# Patient Record
Sex: Female | Born: 1945 | Race: White | Hispanic: No | Marital: Married | Smoking: Never smoker
Health system: Southern US, Community
[De-identification: ages and names within clinical notes are randomized; demographics above are authoritative.]

## PROBLEM LIST (undated history)

## (undated) DIAGNOSIS — Z800 Family history of malignant neoplasm of digestive organs: Secondary | ICD-10-CM

## (undated) DIAGNOSIS — Z860100 Personal history of colon polyps, unspecified: Secondary | ICD-10-CM

## (undated) DIAGNOSIS — E785 Hyperlipidemia, unspecified: Secondary | ICD-10-CM

## (undated) DIAGNOSIS — R0600 Dyspnea, unspecified: Secondary | ICD-10-CM

## (undated) DIAGNOSIS — C182 Malignant neoplasm of ascending colon: Secondary | ICD-10-CM

## (undated) DIAGNOSIS — K219 Gastro-esophageal reflux disease without esophagitis: Secondary | ICD-10-CM

## (undated) DIAGNOSIS — I214 Non-ST elevation (NSTEMI) myocardial infarction: Secondary | ICD-10-CM

## (undated) DIAGNOSIS — I639 Cerebral infarction, unspecified: Secondary | ICD-10-CM

## (undated) DIAGNOSIS — I219 Acute myocardial infarction, unspecified: Secondary | ICD-10-CM

## (undated) DIAGNOSIS — R112 Nausea with vomiting, unspecified: Secondary | ICD-10-CM

## (undated) DIAGNOSIS — Z8679 Personal history of other diseases of the circulatory system: Secondary | ICD-10-CM

## (undated) DIAGNOSIS — Z9889 Other specified postprocedural states: Secondary | ICD-10-CM

## (undated) DIAGNOSIS — D509 Iron deficiency anemia, unspecified: Secondary | ICD-10-CM

## (undated) DIAGNOSIS — K5900 Constipation, unspecified: Secondary | ICD-10-CM

## (undated) DIAGNOSIS — I2510 Atherosclerotic heart disease of native coronary artery without angina pectoris: Secondary | ICD-10-CM

## (undated) DIAGNOSIS — M25511 Pain in right shoulder: Secondary | ICD-10-CM

## (undated) DIAGNOSIS — I10 Essential (primary) hypertension: Secondary | ICD-10-CM

## (undated) HISTORY — PX: OTHER SURGICAL HISTORY: SHX169

## (undated) HISTORY — PX: CHOLECYSTECTOMY: SHX55

## (undated) HISTORY — PX: CORONARY ANGIOPLASTY: SHX604

## (undated) HISTORY — DX: Personal history of colonic polyps: Z86.010

## (undated) HISTORY — PX: BACK SURGERY: SHX140

## (undated) HISTORY — PX: NASAL FRACTURE SURGERY: SHX718

## (undated) HISTORY — DX: Personal history of colon polyps, unspecified: Z86.0100

## (undated) HISTORY — DX: Family history of malignant neoplasm of digestive organs: Z80.0

## (undated) HISTORY — PX: ABDOMINAL HYSTERECTOMY: SHX81

## (undated) HISTORY — PX: CYST EXCISION: SHX5701

---
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
## 1994-06-08 DIAGNOSIS — I639 Cerebral infarction, unspecified: Secondary | ICD-10-CM

## 1994-06-08 HISTORY — DX: Cerebral infarction, unspecified: I63.9

## 1998-08-13 ENCOUNTER — Other Ambulatory Visit: Admission: RE | Admit: 1998-08-13 | Discharge: 1998-08-13 | Payer: Self-pay | Admitting: Obstetrics and Gynecology

## 1999-07-24 ENCOUNTER — Encounter: Admission: RE | Admit: 1999-07-24 | Discharge: 1999-07-24 | Payer: Self-pay | Admitting: Obstetrics and Gynecology

## 1999-07-24 ENCOUNTER — Encounter: Payer: Self-pay | Admitting: Obstetrics and Gynecology

## 1999-08-01 ENCOUNTER — Encounter: Payer: Self-pay | Admitting: Obstetrics and Gynecology

## 1999-08-01 ENCOUNTER — Encounter: Admission: RE | Admit: 1999-08-01 | Discharge: 1999-08-01 | Payer: Self-pay | Admitting: Obstetrics and Gynecology

## 1999-08-26 ENCOUNTER — Ambulatory Visit (HOSPITAL_COMMUNITY): Admission: RE | Admit: 1999-08-26 | Discharge: 1999-08-26 | Payer: Self-pay | Admitting: *Deleted

## 1999-08-26 ENCOUNTER — Encounter: Payer: Self-pay | Admitting: *Deleted

## 2000-03-30 ENCOUNTER — Encounter: Payer: Self-pay | Admitting: Obstetrics and Gynecology

## 2000-03-30 ENCOUNTER — Encounter: Admission: RE | Admit: 2000-03-30 | Discharge: 2000-03-30 | Payer: Self-pay | Admitting: Obstetrics and Gynecology

## 2000-10-25 ENCOUNTER — Encounter: Admission: RE | Admit: 2000-10-25 | Discharge: 2000-10-25 | Payer: Self-pay | Admitting: Obstetrics and Gynecology

## 2000-10-25 ENCOUNTER — Encounter: Payer: Self-pay | Admitting: Obstetrics and Gynecology

## 2000-12-20 ENCOUNTER — Other Ambulatory Visit: Admission: RE | Admit: 2000-12-20 | Discharge: 2000-12-20 | Payer: Self-pay | Admitting: Obstetrics and Gynecology

## 2001-10-27 ENCOUNTER — Encounter: Admission: RE | Admit: 2001-10-27 | Discharge: 2001-10-27 | Payer: Self-pay | Admitting: Obstetrics and Gynecology

## 2001-10-27 ENCOUNTER — Encounter: Payer: Self-pay | Admitting: Obstetrics and Gynecology

## 2002-06-23 ENCOUNTER — Encounter: Admission: RE | Admit: 2002-06-23 | Discharge: 2002-06-23 | Payer: Self-pay | Admitting: Family Medicine

## 2002-06-23 ENCOUNTER — Encounter: Payer: Self-pay | Admitting: Family Medicine

## 2003-02-25 ENCOUNTER — Encounter: Payer: Self-pay | Admitting: Emergency Medicine

## 2003-02-25 ENCOUNTER — Emergency Department (HOSPITAL_COMMUNITY): Admission: EM | Admit: 2003-02-25 | Discharge: 2003-02-25 | Payer: Self-pay | Admitting: Emergency Medicine

## 2003-03-01 ENCOUNTER — Observation Stay (HOSPITAL_COMMUNITY): Admission: RE | Admit: 2003-03-01 | Discharge: 2003-03-02 | Payer: Self-pay | Admitting: General Surgery

## 2003-03-01 ENCOUNTER — Encounter: Payer: Self-pay | Admitting: General Surgery

## 2003-03-01 ENCOUNTER — Encounter (INDEPENDENT_AMBULATORY_CARE_PROVIDER_SITE_OTHER): Payer: Self-pay | Admitting: Specialist

## 2003-03-29 ENCOUNTER — Encounter: Admission: RE | Admit: 2003-03-29 | Discharge: 2003-03-29 | Payer: Self-pay | Admitting: Obstetrics and Gynecology

## 2003-03-29 ENCOUNTER — Encounter: Payer: Self-pay | Admitting: Obstetrics and Gynecology

## 2004-03-31 ENCOUNTER — Encounter: Admission: RE | Admit: 2004-03-31 | Discharge: 2004-03-31 | Payer: Self-pay | Admitting: Obstetrics and Gynecology

## 2005-04-06 ENCOUNTER — Encounter: Admission: RE | Admit: 2005-04-06 | Discharge: 2005-04-06 | Payer: Self-pay | Admitting: Obstetrics and Gynecology

## 2005-09-03 ENCOUNTER — Emergency Department (HOSPITAL_COMMUNITY): Admission: EM | Admit: 2005-09-03 | Discharge: 2005-09-03 | Payer: Self-pay | Admitting: Emergency Medicine

## 2005-09-16 ENCOUNTER — Ambulatory Visit (HOSPITAL_BASED_OUTPATIENT_CLINIC_OR_DEPARTMENT_OTHER): Admission: RE | Admit: 2005-09-16 | Discharge: 2005-09-16 | Payer: Self-pay | Admitting: Otolaryngology

## 2006-04-08 ENCOUNTER — Encounter: Admission: RE | Admit: 2006-04-08 | Discharge: 2006-04-08 | Payer: Self-pay | Admitting: Obstetrics and Gynecology

## 2007-05-13 ENCOUNTER — Encounter: Admission: RE | Admit: 2007-05-13 | Discharge: 2007-05-13 | Payer: Self-pay | Admitting: Obstetrics and Gynecology

## 2008-05-14 ENCOUNTER — Encounter: Admission: RE | Admit: 2008-05-14 | Discharge: 2008-05-14 | Payer: Self-pay | Admitting: Obstetrics and Gynecology

## 2008-07-11 ENCOUNTER — Encounter: Admission: RE | Admit: 2008-07-11 | Discharge: 2008-07-11 | Payer: Self-pay | Admitting: Family Medicine

## 2009-05-17 ENCOUNTER — Encounter: Admission: RE | Admit: 2009-05-17 | Discharge: 2009-05-17 | Payer: Self-pay | Admitting: Obstetrics and Gynecology

## 2010-05-23 ENCOUNTER — Encounter
Admission: RE | Admit: 2010-05-23 | Discharge: 2010-05-23 | Payer: Self-pay | Source: Home / Self Care | Attending: Obstetrics and Gynecology | Admitting: Obstetrics and Gynecology

## 2010-10-24 NOTE — Op Note (Signed)
[name], Thomas                      ACCOUNT NO.:  [id number]   MEDICAL RECORD NO.:  [id number]                   PATIENT TYPE:  OBV   LOCATION:  0470                                 FACILITY:  [facility]   PHYSICIAN:  Anselm Pancoast. Zachery Dakins, M.D.          DATE OF BIRTH:  1945-09-04   DATE OF PROCEDURE:  03/01/2003  DATE OF DISCHARGE:                                 OPERATIVE REPORT   PREOPERATIVE DIAGNOSIS:  Chronic cholecystitis.   POSTOPERATIVE DIAGNOSIS:  Chronic cholecystitis.   OPERATION:  Laparoscopic cholecystectomy with cholangiogram.   SURGEON:  Anselm Pancoast. Zachery Dakins, M.D.   ASSISTANT:  Ollen Gross. Carolynne Edouard, M.D.   HISTORY:  [name] is a 65 year old female who was seen in the  emergency room early Sunday morning by Dr. Daphine Deutscher after she had presented  during the night and had first been given pain medication. EKG was  unremarkable. An ultrasound of the gallbladder was obtained which showed  stones. When Dr. Daphine Deutscher saw her, she did not appear to be acutely tender and  he recommended that she be scheduled electively. She said she continued to  be nauseous and called yesterday and I saw her in the urgent office. She was  not acutely tender but she was definitely still have symptoms of kind of  nausea, tenderness in the right upper quadrant with deep palpation and she  was complaining of some pain in the back. I recommended we sort of add her  to the OR scheduled for the day and she was in agreement with this.  Preoperatively she was given 3 g of Unasyn and she has PAS stockings and was  taken to the operative suite.   DESCRIPTION OF PROCEDURE:  Induction of general anesthesia, endotracheal  tube, oral tube into the stomach and the abdomen was scrubbed with Betadine  surgical scrub and solution and draped in a sterile manner. She has had a  lower GYN procedure and I made a little incision just below the umbilicus.  The fascia was identified, this was picked up between  Kocher's, kind of  carefully opened. The posterior fascia was identified, this was picked up  carefully. I entered into the peritoneal cavity. A pursestring suture of #0  Vicryl was placed and then the Hasson cannula introduced. Upon looking at  the gallbladder it was very tense, not hemorrhagic and the upper 10 mm  trocars placed in the subxiphoid area after anesthetizing the fascia. Dr.  Carolynne Edouard placed the two lateral 5 mm trocars below the lower edge of the liver.  The gallbladder was grasped and retracted upward. The omentum that was  slightly adherent to this was carefully dissected down and then the proximal  portion of the gallbladder identified. You could see a stone impacted in the  neck of the cystic duct gallbladder junction and we placed a hemoclip on the  cystic duct just below this. Next, there was a little peritoneal area where  the  little cystic lymph node was. I placed one clip below it and then freed  this and then identified the cystic artery that was hemoclipped also. Next,  a Cook catheter was introduced into the cystic duct, cholangiogram showed  good flow into the duodenum and I then removed the cystic duct catheter,  triply clipped the cystic duct divided it and then placed two additional  clips on the cystic artery and then divided it under direct vision. The  gallbladder was kind of on the mesentery and this was carefully divided with  the hook electrocautery and after the gallbladder was completely freed, good  hemostasis. I did place the distended gallbladder in an EndoCatch bag. Next  the camera was switched to the upper 10 mm trocar and then the gallbladder  was grasped, brought up through the umbilical fascial defect. I opened the  bag, grasped the gallbladder and then decompressed it. The stones were  reasonably large and I crushed those and they would come through the fascial  defect without enlarging it. I then placed a figure-of-eight in addition to  the  positive of #0 Vicryl and these were tied. I then anesthetized the  fascia at the umbilicus. The irrigating fluid had been aspirated, the 5 mm  ports were withdrawn under direct vision, the carbon dioxide released and  then the upper 10 mm trocar withdrawn. The subcutaneous wounds were closed  with 4-0 Vicryl, Benzoin and Steri-Strips on the skin. The patient tolerated  the procedure nicely and hopefully will have minimal pain postoperatively  and be able to be discharged in the a.m.                                               Anselm Pancoast. Zachery Dakins, M.D.    WJW/MEDQ  D:  03/01/2003  T:  03/02/2003  Job:  [id number]

## 2010-10-24 NOTE — Op Note (Signed)
NAMEVERLEY, PARISEAU            ACCOUNT NO.:  [id number]   MEDICAL RECORD NO.:  [id number]           PATIENT TYPE:   LOCATION:                                 FACILITY:   PHYSICIAN:  Suzanna Obey, M.D.            DATE OF BIRTH:   DATE OF PROCEDURE:  09/16/2005  DATE OF DISCHARGE:                                 OPERATIVE REPORT   PREOPERATIVE DIAGNOSIS:  Nasal fracture.   POSTOPERATIVE DIAGNOSIS:  Nasal fracture.   OPERATION PERFORMED:  Closed reduction of nasal fracture with stabilization.   SURGEON:  Suzanna Obey, M.D.   ANESTHESIA:  General.   ESTIMATED BLOOD LOSS:  Less than 1 mL.   INDICATIONS FOR PROCEDURE:  This is a 65 year old who has had a fall and hit  her nose and now has a deviation of her nose to the right.  It is bothering  her and she does need to have it repaired.  She was informed of the risks  and benefits of the procedure including bleeding, infection, scarring,  possibility that fracture will not reduce.  Possible need for future  rhinoplasty and risks of the anesthetic.  All questions were answered and  consent was obtained.   DESCRIPTION OF PROCEDURE:  The patient was taken to the operating room and  placed in supine position after adequate LMA anesthesia, Afrin pledgets were  placed in the nose bilaterally.  The nose was then reduced.  The bone did  move significantly on the right side back into position.  The nose then  looked straight.  The elevator was placed in the left side and the bones  were elevated on the outside slightly.  It did feel like it moved very  slightly.  Again the dorsum looked straight.  The septum looked good.  Steri-  Strips and benzoin were placed over the dorsum of the nose and Aquaplast  cast positioned.  The patient was awakened and brought to recovery in stable  condition.  Counts correct.           ______________________________  Suzanna Obey, M.D.     JB/MEDQ  D:  09/16/2005  T:  09/16/2005  Job:  [id number]

## 2010-10-24 NOTE — Consult Note (Signed)
[name], [name]                      ACCOUNT NO.:  [id number]   MEDICAL RECORD NO.:  [id number]                   PATIENT TYPE:  EMS   LOCATION:  MINO                                 FACILITY:  [facility]   PHYSICIAN:  [place]. Daphine Deutscher, M.D.             DATE OF BIRTH:  01-28-1946   DATE OF CONSULTATION:  02/25/2003  DATE OF DISCHARGE:                                   CONSULTATION   REFERRING PHYSICIAN:  Dr. Celene Kras.   PHONE NUMBER:  [phone]   ADDRESS:  [address]  [place], [address]   CHIEF COMPLAINT:  Upper abdominal pain beginning around 3 a.m. on February 25, 2003.   HISTORY:  I was asked to see this 65 year old white female who was awakened  on this Sunday morning about 3 a.m. with epigastric pain which she described  in the xiphoid region radiating into her back.  She presented to the [facility]gency Room and was checked around 9 o'clock, where she was having this  pain.  She was evaluated and lab drawn and was sent for an ultrasound.  This  showed a gallstone that may be down toward the neck of her gallbladder.  Following then, when she was seen by me about 1400 hours, the pain had  abated and she was feeling much better.   ALLERGIES:  Past medical history is pertinent for an allergy to DARVON  COMPOUND.   CURRENT MEDICINES:  Current medicines include Prilosec and Zantac.   PAST MEDICAL HISTORY:  Past medical history includes surgery of a  hysterectomy through a lower midline incision.   PHYSICAL EXAMINATION:  GENERAL:  Physical exam shows her to be in no acute  distress.  VITAL SIGNS:  Blood pressure is 156/82, pulse 54, respirations 12 and  afebrile.  HEENT:  Head normocephalic.  Eyes:  Sclerae are nonicteric.  Pupils are  equal, round and reactive to light.  Nose and throat exam unremarkable.  NECK:  Neck is supple.  No adenopathy or thyromegaly.  CHEST:  Chest is clear to auscultation.  HEART:  Sinus rhythm without murmurs or  gallops.  ABDOMEN:  Abdomen is moderately obese.  There is a lower midline incision  which has healed.  She is no longer sore in the upper abdomen.  EXTREMITIES:  Extremity exam shows some increased varicosities on the right  side consistent with some old superficial phlebitis that she had when she  was pregnant.   LABORATORY DATA:  Laboratory was reviewed and included a sodium of 137,  potassium 3.3 and was normal, including an alkaline phosphatase normal at  97, total bilirubin of 0.5.  Amylase was 83, which is in the normal range,  as was a lipase of 26.   Hemoglobin 13.7, white blood count of 10,400.   Ultrasound of the gallbladder is consistent with a gallstone.   IMPRESSION:  Acute cholecystitis, resolving.   PLAN:  The patient will be scheduled for semi-urgent cholecystectomy this  week at our office by our office.                                               Thorn[place] Park Daphine Deutscher, M.D.    MBM/MEDQ  D:  02/25/2003  T:  02/25/2003  Job:  [id number]

## 2011-03-11 ENCOUNTER — Other Ambulatory Visit: Payer: Self-pay | Admitting: Dermatology

## 2011-05-07 ENCOUNTER — Other Ambulatory Visit: Payer: Self-pay | Admitting: Obstetrics and Gynecology

## 2011-05-07 DIAGNOSIS — Z1231 Encounter for screening mammogram for malignant neoplasm of breast: Secondary | ICD-10-CM

## 2011-06-03 ENCOUNTER — Ambulatory Visit
Admission: RE | Admit: 2011-06-03 | Discharge: 2011-06-03 | Disposition: A | Discharge status: Home / Self Care | Payer: Medicare Other | Source: Ambulatory Visit | Attending: Obstetrics and Gynecology | Admitting: Obstetrics and Gynecology

## 2011-06-03 DIAGNOSIS — Z1231 Encounter for screening mammogram for malignant neoplasm of breast: Secondary | ICD-10-CM

## 2012-05-10 ENCOUNTER — Other Ambulatory Visit: Payer: Self-pay | Admitting: Obstetrics and Gynecology

## 2012-05-10 DIAGNOSIS — Z1231 Encounter for screening mammogram for malignant neoplasm of breast: Secondary | ICD-10-CM

## 2012-06-13 ENCOUNTER — Ambulatory Visit
Admission: RE | Admit: 2012-06-13 | Discharge: 2012-06-13 | Disposition: A | Discharge status: Home / Self Care | Payer: Medicare Other | Source: Ambulatory Visit | Attending: Obstetrics and Gynecology | Admitting: Obstetrics and Gynecology

## 2012-06-13 DIAGNOSIS — Z1231 Encounter for screening mammogram for malignant neoplasm of breast: Secondary | ICD-10-CM

## 2013-05-25 ENCOUNTER — Other Ambulatory Visit: Payer: Self-pay

## 2013-05-25 DIAGNOSIS — Z1231 Encounter for screening mammogram for malignant neoplasm of breast: Secondary | ICD-10-CM

## 2013-06-26 ENCOUNTER — Ambulatory Visit
Admission: RE | Admit: 2013-06-26 | Discharge: 2013-06-26 | Disposition: A | Discharge status: Home / Self Care | Payer: Self-pay | Source: Ambulatory Visit

## 2013-06-26 DIAGNOSIS — Z1231 Encounter for screening mammogram for malignant neoplasm of breast: Secondary | ICD-10-CM

## 2013-12-16 ENCOUNTER — Encounter (HOSPITAL_COMMUNITY): Payer: Self-pay | Admitting: Emergency Medicine

## 2013-12-16 ENCOUNTER — Emergency Department (INDEPENDENT_AMBULATORY_CARE_PROVIDER_SITE_OTHER)
Admission: EM | Admit: 2013-12-16 | Discharge: 2013-12-16 | Disposition: A | Discharge status: Nursing Home (Includes ICF) | Payer: Medicare Other | Source: Home / Self Care

## 2013-12-16 ENCOUNTER — Emergency Department (HOSPITAL_COMMUNITY): Payer: Medicare Other

## 2013-12-16 ENCOUNTER — Emergency Department (HOSPITAL_COMMUNITY)
Admission: EM | Admit: 2013-12-16 | Discharge: 2013-12-16 | Disposition: A | Discharge status: Home / Self Care | Payer: Medicare Other | Attending: Emergency Medicine | Admitting: Emergency Medicine

## 2013-12-16 DIAGNOSIS — R079 Chest pain, unspecified: Secondary | ICD-10-CM

## 2013-12-16 DIAGNOSIS — R509 Fever, unspecified: Secondary | ICD-10-CM

## 2013-12-16 DIAGNOSIS — I10 Essential (primary) hypertension: Secondary | ICD-10-CM | POA: Insufficient documentation

## 2013-12-16 DIAGNOSIS — R000 Tachycardia, unspecified: Secondary | ICD-10-CM

## 2013-12-16 DIAGNOSIS — J029 Acute pharyngitis, unspecified: Secondary | ICD-10-CM | POA: Insufficient documentation

## 2013-12-16 DIAGNOSIS — R9431 Abnormal electrocardiogram [ECG] [EKG]: Secondary | ICD-10-CM

## 2013-12-16 DIAGNOSIS — R0789 Other chest pain: Secondary | ICD-10-CM | POA: Insufficient documentation

## 2013-12-16 DIAGNOSIS — Z7982 Long term (current) use of aspirin: Secondary | ICD-10-CM | POA: Insufficient documentation

## 2013-12-16 HISTORY — DX: Essential (primary) hypertension: I10

## 2013-12-16 LAB — COMPREHENSIVE METABOLIC PANEL
ALT: 16 U/L (ref 0–35)
ANION GAP: 20 — AB (ref 5–15)
AST: 22 U/L (ref 0–37)
Albumin: 3.9 g/dL (ref 3.5–5.2)
Alkaline Phosphatase: 106 U/L (ref 39–117)
BILIRUBIN TOTAL: 0.6 mg/dL (ref 0.3–1.2)
BUN: 14 mg/dL (ref 6–23)
CHLORIDE: 92 meq/L — AB (ref 96–112)
CO2: 24 meq/L (ref 19–32)
CREATININE: 1.21 mg/dL — AB (ref 0.50–1.10)
Calcium: 9.7 mg/dL (ref 8.4–10.5)
GFR, EST AFRICAN AMERICAN: 52 mL/min — AB (ref >90)
GFR, EST NON AFRICAN AMERICAN: 45 mL/min — AB (ref >90)
GLUCOSE: 117 mg/dL — AB (ref 70–99)
Potassium: 3.5 mEq/L — ABNORMAL LOW (ref 3.7–5.3)
Sodium: 136 mEq/L — ABNORMAL LOW (ref 137–147)
Total Protein: 8.5 g/dL — ABNORMAL HIGH (ref 6.0–8.3)

## 2013-12-16 LAB — CBC WITH DIFFERENTIAL/PLATELET
Basophils Absolute: 0 10*3/uL (ref 0.0–0.1)
Basophils Relative: 0 % (ref 0–1)
Eosinophils Absolute: 0 10*3/uL (ref 0.0–0.7)
Eosinophils Relative: 0 % (ref 0–5)
HEMATOCRIT: 38.6 % (ref 36.0–46.0)
Hemoglobin: 12.9 g/dL (ref 12.0–15.0)
LYMPHS PCT: 6 % — AB (ref 12–46)
Lymphs Abs: 1 10*3/uL (ref 0.7–4.0)
MCH: 29.2 pg (ref 26.0–34.0)
MCHC: 33.4 g/dL (ref 30.0–36.0)
MCV: 87.3 fL (ref 78.0–100.0)
MONOS PCT: 6 % (ref 3–12)
Monocytes Absolute: 0.9 10*3/uL (ref 0.1–1.0)
NEUTROS ABS: 14.2 10*3/uL — AB (ref 1.7–7.7)
Neutrophils Relative %: 88 % — ABNORMAL HIGH (ref 43–77)
Platelets: 284 10*3/uL (ref 150–400)
RBC: 4.42 MIL/uL (ref 3.87–5.11)
RDW: 14.1 % (ref 11.5–15.5)
WBC: 16.2 10*3/uL — AB (ref 4.0–10.5)

## 2013-12-16 LAB — POCT RAPID STREP A: Streptococcus, Group A Screen (Direct): NEGATIVE

## 2013-12-16 LAB — TROPONIN I

## 2013-12-16 MED ORDER — CLINDAMYCIN PHOSPHATE 600 MG/50ML IV SOLN
600.0000 mg | Freq: Once | INTRAVENOUS | Status: AC
  Administered 2013-12-16: 600 mg via INTRAVENOUS
  Filled 2013-12-16: qty 50

## 2013-12-16 MED ORDER — ACETAMINOPHEN 325 MG PO TABS
650.0000 mg | ORAL_TABLET | Freq: Once | ORAL | Status: AC
  Administered 2013-12-16: 650 mg via ORAL

## 2013-12-16 MED ORDER — SODIUM CHLORIDE 0.9 % IV SOLN
Freq: Once | INTRAVENOUS | Status: AC
  Administered 2013-12-16: 17:00:00 via INTRAVENOUS

## 2013-12-16 MED ORDER — ACETAMINOPHEN 325 MG PO TABS
ORAL_TABLET | ORAL | Status: AC
Start: 2013-12-16 — End: 2013-12-16
  Filled 2013-12-16: qty 2

## 2013-12-16 MED ORDER — CEFTRIAXONE SODIUM 1 G IJ SOLR: 1.0000 g | Freq: Once | INTRAMUSCULAR | Status: DC

## 2013-12-16 MED ORDER — CLINDAMYCIN HCL 300 MG PO CAPS: 300.0000 mg | ORAL_CAPSULE | Freq: Four times a day (QID) | ORAL | Status: DC

## 2013-12-16 NOTE — ED Provider Notes (Signed)
CSN: [id number]     Arrival date & time 12/16/13  1439 History   First MD Initiated Contact with Patient 12/16/13 1459     Chief Complaint  Patient presents with  . Sore Throat   (Consider location/radiation/quality/duration/timing/severity/associated sxs/prior Treatment) HPI Comments: 68 year old female developed a sore throat 48 hours ago. A few hours later she developed a fever, fatigue and malaise. Earlier in the day she had been cleaning a room. After completing at work she had approximately 1 minute of anterior chest heaviness. There were no associated symptoms. This morning she had a similar episode of chest heaviness that lasted approximately 1 minute not associated with other symptoms other than the ongoing sore throat and fever and diaphoresis.    Past Medical History  Diagnosis Date  . Hypertension    Past Surgical History  Procedure Laterality Date  . Back surgery    . Cholecystectomy     History reviewed. No pertinent family history. History  Substance Use Topics  . Smoking status: Never Smoker   . Smokeless tobacco: Not on file  . Alcohol Use: No   OB History   Grav Para Term Preterm Abortions TAB SAB Ect Mult Living                 Review of Systems  Constitutional: Positive for fever, chills, activity change, appetite change and fatigue.  HENT: Positive for sore throat and trouble swallowing. Negative for congestion and ear discharge.   Eyes: Negative.   Respiratory: Negative for cough, shortness of breath and wheezing.   Cardiovascular: Positive for chest pain. Negative for palpitations.  Gastrointestinal: Negative.   Genitourinary: Negative.   Skin: Negative.   Neurological: Negative.     Allergies  Review of patient's allergies indicates no known allergies.  Home Medications   Prior to Admission medications   Medication Sig Start Date End Date Taking? Authorizing Provider  ASPIRIN PO Take by mouth.   Yes Historical Provider, MD  losartan (COZAAR)  100 MG tablet Take 100 mg by mouth daily.   Yes Historical Provider, MD  Omeprazole (PRILOSEC PO) Take by mouth.   Yes Historical Provider, MD   BP 137/86  Pulse 120  Temp(Src) 102.7 F (39.3 C) (Oral)  Resp 18  SpO2 96% Physical Exam  Nursing note and vitals reviewed. Constitutional: She is oriented to person, place, and time. She appears well-developed and well-nourished. No distress.  Sitting on exam table with elevated head of bed. Appears mildly ill.  HENT:  Right Ear: External ear normal.  Left Ear: External ear normal.  Oropharynx with erythema. Posterior pharynx with  copious amount of white, purulent drainage. No evidence of abscess. Airway patent.  Eyes: Conjunctivae and EOM are normal.  Neck: Normal range of motion. Neck supple.  Cardiovascular: Normal heart sounds and intact distal pulses.   Tachycardia  Pulmonary/Chest: Effort normal and breath sounds normal. No respiratory distress. She has no wheezes. She has no rales.  Lymphadenopathy:    She has no cervical adenopathy.  Neurological: She is alert and oriented to person, place, and time. She exhibits normal muscle tone.  Skin: Skin is warm and dry.    ED Course  Procedures (including critical care time) Labs Review Labs Reviewed  CULTURE, GROUP A STREP  POCT RAPID STREP A ([place])   Results for orders placed during the hospital encounter of 12/16/13  POCT RAPID STREP A ([facility] ONLY)      Result Value Ref Range  Streptococcus, Group A Screen (Direct) NEGATIVE  NEGATIVE     Imaging Review No results found. EKG: Sinus tach: ST depression II. T wave inversion III. Poor R wave progression in anterior leads.`  MDM   1. Chest pain at rest   2. Exudative pharyngitis   3. Tachycardia   4. Abnormal EKG   5. Fever and chills     Transfer to [place] via [facility] for evaluation of chest tightness that was incidental to a chief complaint of fever and exudative pharyngitis.     Janne Napoleon,  NP 12/16/13 1627

## 2013-12-16 NOTE — ED Provider Notes (Signed)
CSN: [id number]     Arrival date & time 12/16/13  1705 History   First MD Initiated Contact with Patient 12/16/13 1705     Chief Complaint  Patient presents with  . Chest Pain  . Sore Throat     (Consider location/radiation/quality/duration/timing/severity/associated sxs/prior Treatment) HPI Comments: Patient is a 68 year old female with history of hypertension and back surgery. She presents from urgent care for further evaluation of chest pain. She tells me she developed a sore throat 2-3 days ago which has worsened. She developed a fever today and went to urgent care to be evaluated for this. Her strep test was negative. While she was there, she informed them that she has been having intermittent tightness in her chest for the past several months. This is not associated with exertion. There is no shortness of breath, diaphoresis, nausea, or radiation to the arm or jaw. These episodes last for several minutes then resolve. She tells me it has been several days since her last episode.  Urgent care was concerned that her EKG looked somewhat different and she was sent here for further evaluation.  Patient is a 69 y.o. female presenting with chest pain and pharyngitis. The history is provided by the patient.  Chest Pain Pain location:  Substernal area Pain quality: tightness   Pain radiates to:  Does not radiate Pain radiates to the back: no   Pain severity:  Mild Duration:  2 months Timing:  Intermittent Progression:  Worsening Chronicity:  New Relieved by:  Nothing Worsened by:  Nothing tried Ineffective treatments:  None tried Sore Throat Associated symptoms include chest pain.    Past Medical History  Diagnosis Date  . Hypertension    Past Surgical History  Procedure Laterality Date  . Back surgery    . Cholecystectomy     History reviewed. No pertinent family history. History  Substance Use Topics  . Smoking status: Never Smoker   . Smokeless tobacco: Not on file  .  Alcohol Use: No   OB History   Grav Para Term Preterm Abortions TAB SAB Ect Mult Living                 Review of Systems  Cardiovascular: Positive for chest pain.  All other systems reviewed and are negative.     Allergies  Review of patient's allergies indicates no known allergies.  Home Medications   Prior to Admission medications   Medication Sig Start Date End Date Taking? Authorizing Provider  ASPIRIN PO Take by mouth.    Historical Provider, MD  losartan (COZAAR) 100 MG tablet Take 100 mg by mouth daily.    Historical Provider, MD  Omeprazole (PRILOSEC PO) Take by mouth.    Historical Provider, MD   BP 137/68  Pulse 91  Temp(Src) 98.7 F (37.1 C) (Oral)  Resp 17  SpO2 96% Physical Exam  Nursing note and vitals reviewed. Constitutional: She is oriented to person, place, and time. She appears well-developed and well-nourished. No distress.  HENT:  Head: Normocephalic and atraumatic.  There is some erythema of the posterior oropharynx along with exudates.  Neck: Normal range of motion. Neck supple.  Cardiovascular: Normal rate and regular rhythm.  Exam reveals no gallop and no friction rub.   No murmur heard. Pulmonary/Chest: Effort normal and breath sounds normal. No respiratory distress. She has no wheezes.  Abdominal: Soft. Bowel sounds are normal. She exhibits no distension. There is no tenderness.  Musculoskeletal: Normal range of motion. She exhibits no edema.  Lymphadenopathy:    She has no cervical adenopathy.  Neurological: She is alert and oriented to person, place, and time.  Skin: Skin is warm and dry. She is not diaphoretic.    ED Course  Procedures (including critical care time) Labs Review Labs Reviewed - No data to display  Imaging Review No results found.   EKG Interpretation   Date/Time:  Saturday December 16 2013 17:05:59 EDT Ventricular Rate:  93 PR Interval:  134 QRS Duration: 91 QT Interval:  360 QTC Calculation: 448 R Axis:    -19 Text Interpretation:  Sinus rhythm Borderline left axis deviation Low  voltage, precordial leads RSR' in V1 or V2, right VCD or RVH Borderline T  abnormalities, anterior leads Confirmed by DELOS  MD, Felcia Huebert ([id number]) on  12/16/2013 7:22:44 PM      MDM   Final diagnoses:  None    The patient presents here from urgent care for evaluation of sore throat and chest discomfort. The sore throat has been for the past 3 days and her chest discomfort has been intermittent for past several months. Her troponin is negative, however her EKG shows subtle changes from a prior study in 2007. I am uncertain as to the significance of this, however she is not having any discomfort and tells me she has not had any discomfort in several days. I feel as though she is appropriate for discharge. She will require cardiology followup and contact information for [place] Will be provided to her. She is to followup with cardiology in the next few days to discuss a stress test.    Veryl Speak, MD 12/16/13 1927

## 2013-12-16 NOTE — ED Notes (Signed)
Dr. Delo at bedside. 

## 2013-12-16 NOTE — ED Notes (Signed)
[facility] presents with a 68 yo pt from [facility] with chest pressure and sore throat pain.  Pt has had soreness of her throat since last Thursday and she c/o chest pressure midsternum for an unknown origin of time. Pt had temperature of 102.7 at urgent care and was given tylenol.  Pt had been cleaning over past few days with [employer] and [employer].

## 2013-12-16 NOTE — ED Notes (Signed)
[facility] has no truck Notified [facility]

## 2013-12-16 NOTE — ED Notes (Signed)
Patient transported to X-ray 

## 2013-12-16 NOTE — ED Notes (Signed)
Per [employer], blood in tubes came from urgent care.  Kimberly Lapan-Hutchens drew blood at IV site.  Called [facility], lab ok to send down.  Tubes not timed, dated,initialed.

## 2013-12-16 NOTE — ED Notes (Signed)
Pt placed into gown and on monitor upon arrival to room. Pt monitored by 12 lead, blood pressure, and pulse ox.  

## 2013-12-16 NOTE — Discharge Instructions (Signed)
Clindamycin as prescribed.  Call [facility] cardiology to arrange a followup appointment. The contact information has been provided on this discharge summary.  Return to the emergency department if he develops severe chest pain, difficulty breathing, or an inability to swallow.   Chest Pain (Nonspecific) It is often hard to give a specific diagnosis for the cause of chest pain. There is always a chance that your pain could be related to something serious, such as a heart attack or a blood clot in the lungs. You need to follow up with your health care provider for further evaluation. CAUSES   Heartburn.  Pneumonia or bronchitis.  Anxiety or stress.  Inflammation around your heart (pericarditis) or lung (pleuritis or pleurisy).  A blood clot in the lung.  A collapsed lung (pneumothorax). It can develop suddenly on its own (spontaneous pneumothorax) or from trauma to the chest.  Shingles infection (herpes zoster virus). The chest wall is composed of bones, muscles, and cartilage. Any of these can be the source of the pain.  The bones can be bruised by injury.  The muscles or cartilage can be strained by coughing or overwork.  The cartilage can be affected by inflammation and become sore (costochondritis). DIAGNOSIS  Lab tests or other studies may be needed to find the cause of your pain. Your health care provider may have you take a test called an ambulatory electrocardiogram (ECG). An ECG records your heartbeat patterns over a 24-hour period. You may also have other tests, such as:  Transthoracic echocardiogram (TTE). During echocardiography, sound waves are used to evaluate how blood flows through your heart.  Transesophageal echocardiogram (TEE).  Cardiac monitoring. This allows your health care provider to monitor your heart rate and rhythm in real time.  Holter monitor. This is a portable device that records your heartbeat and can help diagnose heart arrhythmias. It allows your  health care provider to track your heart activity for several days, if needed.  Stress tests by exercise or by giving medicine that makes the heart beat faster. TREATMENT   Treatment depends on what may be causing your chest pain. Treatment may include:  Acid blockers for heartburn.  Anti-inflammatory medicine.  Pain medicine for inflammatory conditions.  Antibiotics if an infection is present.  You may be advised to change lifestyle habits. This includes stopping smoking and avoiding alcohol, caffeine, and chocolate.  You may be advised to keep your head raised (elevated) when sleeping. This reduces the chance of acid going backward from your stomach into your esophagus. Most of the time, nonspecific chest pain will improve within 2-3 days with rest and mild pain medicine.  HOME CARE INSTRUCTIONS   If antibiotics were prescribed, take them as directed. Finish them even if you start to feel better.  For the next few days, avoid physical activities that bring on chest pain. Continue physical activities as directed.  Do not use any tobacco products, including cigarettes, chewing tobacco, or electronic cigarettes.  Avoid drinking alcohol.  Only take medicine as directed by your health care provider.  Follow your health care provider's suggestions for further testing if your chest pain does not go away.  Keep any follow-up appointments you made. If you do not go to an appointment, you could develop lasting (chronic) problems with pain. If there is any problem keeping an appointment, call to reschedule. SEEK MEDICAL CARE IF:   Your chest pain does not go away, even after treatment.  You have a rash with blisters on your chest.  You have a fever. SEEK IMMEDIATE MEDICAL CARE IF:   You have increased chest pain or pain that spreads to your arm, neck, jaw, back, or abdomen.  You have shortness of breath.  You have an increasing cough, or you cough up blood.  You have severe  back or abdominal pain.  You feel nauseous or vomit.  You have severe weakness.  You faint.  You have chills. This is an emergency. Do not wait to see if the pain will go away. Get medical help at once. Call your local emergency services (911 in [place] Do not drive yourself to the hospital. MAKE SURE YOU:   Understand these instructions.  Will watch your condition.  Will get help right away if you are not doing well or get worse. Document Released: 03/04/2005 Document Revised: 05/30/2013 Document Reviewed: 12/29/2007 [facility] Patient Information 2015 [place], [place]. This information is not intended to replace advice given to you by your health care provider. Make sure you discuss any questions you have with your health care provider.  Pharyngitis Pharyngitis is redness, pain, and swelling (inflammation) of your pharynx.  CAUSES  Pharyngitis is usually caused by infection. Most of the time, these infections are from viruses (viral) and are part of a cold. However, sometimes pharyngitis is caused by bacteria (bacterial). Pharyngitis can also be caused by allergies. Viral pharyngitis may be spread from person to person by coughing, sneezing, and personal items or utensils (cups, forks, spoons, toothbrushes). Bacterial pharyngitis may be spread from person to person by more intimate contact, such as kissing.  SIGNS AND SYMPTOMS  Symptoms of pharyngitis include:   Sore throat.   Tiredness (fatigue).   Low-grade fever.   Headache.  Joint pain and muscle aches.  Skin rashes.  Swollen lymph nodes.  Plaque-like film on throat or tonsils (often seen with bacterial pharyngitis). DIAGNOSIS  Your health care provider will ask you questions about your illness and your symptoms. Your medical history, along with a physical exam, is often all that is needed to diagnose pharyngitis. Sometimes, a rapid strep test is done. Other lab tests may also be done, depending on the suspected  cause.  TREATMENT  Viral pharyngitis will usually get better in 3-4 days without the use of medicine. Bacterial pharyngitis is treated with medicines that kill germs (antibiotics).  HOME CARE INSTRUCTIONS   Drink enough water and fluids to keep your urine clear or pale yellow.   Only take over-the-counter or prescription medicines as directed by your health care provider:   If you are prescribed antibiotics, make sure you finish them even if you start to feel better.   Do not take aspirin.   Get lots of rest.   Gargle with 8 oz of salt water ( tsp of salt per 1 qt of water) as often as every 1-2 hours to soothe your throat.   Throat lozenges (if you are not at risk for choking) or sprays may be used to soothe your throat. SEEK MEDICAL CARE IF:   You have large, tender lumps in your neck.  You have a rash.  You cough up green, yellow-brown, or bloody spit. SEEK IMMEDIATE MEDICAL CARE IF:   Your neck becomes stiff.  You drool or are unable to swallow liquids.  You vomit or are unable to keep medicines or liquids down.  You have severe pain that does not go away with the use of recommended medicines.  You have trouble breathing (not caused by a stuffy nose). MAKE SURE  YOU:   Understand these instructions.  Will watch your condition.  Will get help right away if you are not doing well or get worse. Document Released: 05/25/2005 Document Revised: 03/15/2013 Document Reviewed: 01/30/2013 [facility] Patient Information 2015 [place], [place]. This information is not intended to replace advice given to you by your health care provider. Make sure you discuss any questions you have with your health care provider.

## 2013-12-16 NOTE — ED Notes (Signed)
Pt remains on 5 lead, blood pressure, and pulse ox.

## 2013-12-16 NOTE — ED Notes (Signed)
Pt  Reports  sorethroat  /  Body  Aches  Chills  For  About  3  Days         Motrin this  Am  At  0600

## 2013-12-18 LAB — CULTURE, GROUP A STREP

## 2013-12-18 NOTE — ED Provider Notes (Signed)
Medical screening examination/treatment/procedure(s) were performed by non-physician practitioner and as supervising physician I was immediately available for consultation/collaboration.  Philipp Deputy, M.D.  Harden Mo, MD 12/18/13 [phone]

## 2013-12-19 ENCOUNTER — Telehealth (HOSPITAL_COMMUNITY): Payer: Self-pay | Admitting: *Deleted

## 2013-12-21 NOTE — ED Notes (Signed)
Throat culture: Strep beta hemolytic not group A.  Pt. was transferred to ED ( for cardiac work-up) and adequately treated with Cleocin by EDP.  I called pt. Pt. verified x 2 and given result.  Pt. told she was adequately treated with Cleocin and to finish all of the antibiotics. If not better after medication to get rechecked.  Pt. voiced understanding. Roselyn Meier 12/21/2013

## 2014-03-01 ENCOUNTER — Other Ambulatory Visit: Payer: Self-pay | Admitting: Family Medicine

## 2014-03-01 DIAGNOSIS — R5381 Other malaise: Secondary | ICD-10-CM

## 2014-06-07 ENCOUNTER — Other Ambulatory Visit: Payer: Self-pay

## 2014-06-07 DIAGNOSIS — Z1231 Encounter for screening mammogram for malignant neoplasm of breast: Secondary | ICD-10-CM

## 2014-06-27 ENCOUNTER — Ambulatory Visit
Admission: RE | Admit: 2014-06-27 | Discharge: 2014-06-27 | Disposition: A | Discharge status: Home / Self Care | Payer: Medicare Other | Source: Ambulatory Visit

## 2014-06-27 DIAGNOSIS — Z1231 Encounter for screening mammogram for malignant neoplasm of breast: Secondary | ICD-10-CM

## 2014-09-26 ENCOUNTER — Other Ambulatory Visit: Payer: Self-pay | Admitting: Family Medicine

## 2014-09-26 DIAGNOSIS — E559 Vitamin D deficiency, unspecified: Secondary | ICD-10-CM

## 2014-10-01 ENCOUNTER — Ambulatory Visit
Admission: RE | Admit: 2014-10-01 | Discharge: 2014-10-01 | Disposition: A | Discharge status: Home / Self Care | Payer: Medicare Other | Source: Ambulatory Visit | Attending: Family Medicine | Admitting: Family Medicine

## 2014-10-01 DIAGNOSIS — E559 Vitamin D deficiency, unspecified: Secondary | ICD-10-CM

## 2015-05-22 ENCOUNTER — Other Ambulatory Visit: Payer: Self-pay

## 2015-05-22 DIAGNOSIS — Z1231 Encounter for screening mammogram for malignant neoplasm of breast: Secondary | ICD-10-CM

## 2015-06-09 DIAGNOSIS — I214 Non-ST elevation (NSTEMI) myocardial infarction: Secondary | ICD-10-CM

## 2015-06-09 DIAGNOSIS — I219 Acute myocardial infarction, unspecified: Secondary | ICD-10-CM

## 2015-06-09 HISTORY — DX: Non-ST elevation (NSTEMI) myocardial infarction: I21.4

## 2015-06-09 HISTORY — DX: Acute myocardial infarction, unspecified: I21.9

## 2015-07-01 ENCOUNTER — Ambulatory Visit
Admission: RE | Admit: 2015-07-01 | Discharge: 2015-07-01 | Disposition: A | Discharge status: Home / Self Care | Payer: Medicare Other | Source: Ambulatory Visit

## 2015-07-01 DIAGNOSIS — Z1231 Encounter for screening mammogram for malignant neoplasm of breast: Secondary | ICD-10-CM

## 2016-02-27 ENCOUNTER — Emergency Department (HOSPITAL_COMMUNITY): Payer: Medicare Other

## 2016-02-27 ENCOUNTER — Encounter (HOSPITAL_COMMUNITY): Payer: Self-pay | Admitting: Emergency Medicine

## 2016-02-27 DIAGNOSIS — R079 Chest pain, unspecified: Secondary | ICD-10-CM | POA: Diagnosis not present

## 2016-02-27 DIAGNOSIS — I517 Cardiomegaly: Secondary | ICD-10-CM | POA: Diagnosis present

## 2016-02-27 DIAGNOSIS — E876 Hypokalemia: Secondary | ICD-10-CM | POA: Diagnosis present

## 2016-02-27 DIAGNOSIS — I129 Hypertensive chronic kidney disease with stage 1 through stage 4 chronic kidney disease, or unspecified chronic kidney disease: Secondary | ICD-10-CM | POA: Diagnosis present

## 2016-02-27 DIAGNOSIS — Z7982 Long term (current) use of aspirin: Secondary | ICD-10-CM

## 2016-02-27 DIAGNOSIS — Z8249 Family history of ischemic heart disease and other diseases of the circulatory system: Secondary | ICD-10-CM

## 2016-02-27 DIAGNOSIS — I2510 Atherosclerotic heart disease of native coronary artery without angina pectoris: Secondary | ICD-10-CM | POA: Diagnosis present

## 2016-02-27 DIAGNOSIS — N183 Chronic kidney disease, stage 3 (moderate): Secondary | ICD-10-CM | POA: Diagnosis present

## 2016-02-27 DIAGNOSIS — Z800 Family history of malignant neoplasm of digestive organs: Secondary | ICD-10-CM

## 2016-02-27 DIAGNOSIS — I214 Non-ST elevation (NSTEMI) myocardial infarction: Principal | ICD-10-CM | POA: Diagnosis present

## 2016-02-27 DIAGNOSIS — Z8673 Personal history of transient ischemic attack (TIA), and cerebral infarction without residual deficits: Secondary | ICD-10-CM

## 2016-02-27 DIAGNOSIS — E785 Hyperlipidemia, unspecified: Secondary | ICD-10-CM | POA: Diagnosis present

## 2016-02-27 DIAGNOSIS — Z801 Family history of malignant neoplasm of trachea, bronchus and lung: Secondary | ICD-10-CM

## 2016-02-27 DIAGNOSIS — E1122 Type 2 diabetes mellitus with diabetic chronic kidney disease: Secondary | ICD-10-CM | POA: Diagnosis present

## 2016-02-27 LAB — BASIC METABOLIC PANEL
ANION GAP: 8 (ref 5–15)
BUN: 12 mg/dL (ref 6–20)
CALCIUM: 9.7 mg/dL (ref 8.9–10.3)
CHLORIDE: 99 mmol/L — AB (ref 101–111)
CO2: 28 mmol/L (ref 22–32)
Creatinine, Ser: 1.19 mg/dL — ABNORMAL HIGH (ref 0.44–1.00)
GFR calc non Af Amer: 45 mL/min — ABNORMAL LOW (ref >60)
GFR, EST AFRICAN AMERICAN: 52 mL/min — AB (ref >60)
Glucose, Bld: 115 mg/dL — ABNORMAL HIGH (ref 65–99)
Potassium: 3.4 mmol/L — ABNORMAL LOW (ref 3.5–5.1)
Sodium: 135 mmol/L (ref 135–145)

## 2016-02-27 LAB — CBC
HEMATOCRIT: 37.1 % (ref 36.0–46.0)
HEMOGLOBIN: 12 g/dL (ref 12.0–15.0)
MCH: 28.4 pg (ref 26.0–34.0)
MCHC: 32.3 g/dL (ref 30.0–36.0)
MCV: 87.7 fL (ref 78.0–100.0)
Platelets: 343 10*3/uL (ref 150–400)
RBC: 4.23 MIL/uL (ref 3.87–5.11)
RDW: 14.6 % (ref 11.5–15.5)
WBC: 10.9 10*3/uL — ABNORMAL HIGH (ref 4.0–10.5)

## 2016-02-27 LAB — I-STAT TROPONIN, ED: TROPONIN I, POC: 0.05 ng/mL (ref 0.00–0.08)

## 2016-02-27 NOTE — ED Triage Notes (Signed)
Pt reports chest tightness and discomfort since approx lunchtime today. Pt reports that she has pain through to her back as well. Pt denies other symptoms. Pt denies fever, cough. resp e/u.

## 2016-02-28 ENCOUNTER — Inpatient Hospital Stay (HOSPITAL_COMMUNITY)
Admission: EM | Admit: 2016-02-28 | Discharge: 2016-02-29 | DRG: 247 | Disposition: A | Discharge status: Home / Self Care | Payer: Medicare Other | Attending: Internal Medicine | Admitting: Internal Medicine

## 2016-02-28 ENCOUNTER — Encounter (HOSPITAL_COMMUNITY)
Admission: EM | Disposition: A | Discharge status: Home / Self Care | Payer: Self-pay | Source: Home / Self Care | Attending: Internal Medicine

## 2016-02-28 ENCOUNTER — Encounter (HOSPITAL_COMMUNITY): Payer: Self-pay | Admitting: Internal Medicine

## 2016-02-28 DIAGNOSIS — I214 Non-ST elevation (NSTEMI) myocardial infarction: Secondary | ICD-10-CM | POA: Diagnosis present

## 2016-02-28 DIAGNOSIS — I209 Angina pectoris, unspecified: Secondary | ICD-10-CM | POA: Diagnosis not present

## 2016-02-28 DIAGNOSIS — Z801 Family history of malignant neoplasm of trachea, bronchus and lung: Secondary | ICD-10-CM | POA: Diagnosis not present

## 2016-02-28 DIAGNOSIS — E1122 Type 2 diabetes mellitus with diabetic chronic kidney disease: Secondary | ICD-10-CM | POA: Diagnosis present

## 2016-02-28 DIAGNOSIS — Z800 Family history of malignant neoplasm of digestive organs: Secondary | ICD-10-CM | POA: Diagnosis not present

## 2016-02-28 DIAGNOSIS — N183 Chronic kidney disease, stage 3 (moderate): Secondary | ICD-10-CM | POA: Diagnosis present

## 2016-02-28 DIAGNOSIS — I2510 Atherosclerotic heart disease of native coronary artery without angina pectoris: Secondary | ICD-10-CM

## 2016-02-28 DIAGNOSIS — I10 Essential (primary) hypertension: Secondary | ICD-10-CM | POA: Diagnosis not present

## 2016-02-28 DIAGNOSIS — E785 Hyperlipidemia, unspecified: Secondary | ICD-10-CM | POA: Diagnosis present

## 2016-02-28 DIAGNOSIS — I517 Cardiomegaly: Secondary | ICD-10-CM | POA: Diagnosis present

## 2016-02-28 DIAGNOSIS — Z7982 Long term (current) use of aspirin: Secondary | ICD-10-CM | POA: Diagnosis not present

## 2016-02-28 DIAGNOSIS — Z8673 Personal history of transient ischemic attack (TIA), and cerebral infarction without residual deficits: Secondary | ICD-10-CM | POA: Diagnosis not present

## 2016-02-28 DIAGNOSIS — E876 Hypokalemia: Secondary | ICD-10-CM | POA: Diagnosis present

## 2016-02-28 DIAGNOSIS — I129 Hypertensive chronic kidney disease with stage 1 through stage 4 chronic kidney disease, or unspecified chronic kidney disease: Secondary | ICD-10-CM | POA: Diagnosis present

## 2016-02-28 DIAGNOSIS — Z8249 Family history of ischemic heart disease and other diseases of the circulatory system: Secondary | ICD-10-CM | POA: Diagnosis not present

## 2016-02-28 DIAGNOSIS — R079 Chest pain, unspecified: Secondary | ICD-10-CM | POA: Diagnosis present

## 2016-02-28 DIAGNOSIS — R0789 Other chest pain: Secondary | ICD-10-CM

## 2016-02-28 HISTORY — PX: CARDIAC CATHETERIZATION: SHX172

## 2016-02-28 HISTORY — DX: Cerebral infarction, unspecified: I63.9

## 2016-02-28 HISTORY — DX: Hyperlipidemia, unspecified: E78.5

## 2016-02-28 LAB — CBC WITH DIFFERENTIAL/PLATELET
BASOS PCT: 1 %
Basophils Absolute: 0.1 10*3/uL (ref 0.0–0.1)
EOS ABS: 0.1 10*3/uL (ref 0.0–0.7)
Eosinophils Relative: 2 %
HEMATOCRIT: 32.8 % — AB (ref 36.0–46.0)
HEMOGLOBIN: 10.6 g/dL — AB (ref 12.0–15.0)
LYMPHS ABS: 3.1 10*3/uL (ref 0.7–4.0)
Lymphocytes Relative: 38 %
MCH: 28 pg (ref 26.0–34.0)
MCHC: 32.3 g/dL (ref 30.0–36.0)
MCV: 86.8 fL (ref 78.0–100.0)
MONO ABS: 0.5 10*3/uL (ref 0.1–1.0)
MONOS PCT: 7 %
NEUTROS PCT: 54 %
Neutro Abs: 4.4 10*3/uL (ref 1.7–7.7)
Platelets: 260 10*3/uL (ref 150–400)
RBC: 3.78 MIL/uL — ABNORMAL LOW (ref 3.87–5.11)
RDW: 14.6 % (ref 11.5–15.5)
WBC: 8.1 10*3/uL (ref 4.0–10.5)

## 2016-02-28 LAB — COMPREHENSIVE METABOLIC PANEL
ALBUMIN: 3.1 g/dL — AB (ref 3.5–5.0)
ALK PHOS: 66 U/L (ref 38–126)
ALT: 12 U/L — AB (ref 14–54)
AST: 17 U/L (ref 15–41)
Anion gap: 7 (ref 5–15)
BUN: 12 mg/dL (ref 6–20)
CALCIUM: 9.1 mg/dL (ref 8.9–10.3)
CHLORIDE: 101 mmol/L (ref 101–111)
CO2: 29 mmol/L (ref 22–32)
CREATININE: 1.11 mg/dL — AB (ref 0.44–1.00)
GFR calc Af Amer: 57 mL/min — ABNORMAL LOW (ref >60)
GFR calc non Af Amer: 49 mL/min — ABNORMAL LOW (ref >60)
GLUCOSE: 113 mg/dL — AB (ref 65–99)
Potassium: 3.1 mmol/L — ABNORMAL LOW (ref 3.5–5.1)
SODIUM: 137 mmol/L (ref 135–145)
Total Bilirubin: 0.5 mg/dL (ref 0.3–1.2)
Total Protein: 6.4 g/dL — ABNORMAL LOW (ref 6.5–8.1)

## 2016-02-28 LAB — TROPONIN I
TROPONIN I: 0.55 ng/mL — AB (ref <0.03)
Troponin I: 0.43 ng/mL (ref <0.03)

## 2016-02-28 LAB — POCT ACTIVATED CLOTTING TIME: ACTIVATED CLOTTING TIME: 335 s

## 2016-02-28 LAB — PROTIME-INR
INR: 1.11
Prothrombin Time: 14.4 seconds (ref 11.4–15.2)

## 2016-02-28 SURGERY — LEFT HEART CATH AND CORONARY ANGIOGRAPHY
Anesthesia: LOCAL

## 2016-02-28 MED ORDER — PANTOPRAZOLE SODIUM 40 MG PO TBEC
40.0000 mg | DELAYED_RELEASE_TABLET | Freq: Every day | ORAL | Status: DC
  Administered 2016-02-28 – 2016-02-29 (×2): 40 mg via ORAL
  Filled 2016-02-28 (×2): qty 1

## 2016-02-28 MED ORDER — LIDOCAINE HCL (PF) 1 % IJ SOLN
INTRAMUSCULAR | Status: DC | PRN
  Administered 2016-02-28: 2 mL via SUBCUTANEOUS

## 2016-02-28 MED ORDER — VERAPAMIL HCL 2.5 MG/ML IV SOLN
INTRAVENOUS | Status: DC | PRN
  Administered 2016-02-28: 10 mL via INTRA_ARTERIAL

## 2016-02-28 MED ORDER — SODIUM CHLORIDE 0.9% FLUSH: 3.0000 mL | INTRAVENOUS | Status: DC | PRN

## 2016-02-28 MED ORDER — ACETAMINOPHEN 325 MG PO TABS: 650.0000 mg | ORAL_TABLET | ORAL | Status: DC | PRN

## 2016-02-28 MED ORDER — HEPARIN SODIUM (PORCINE) 1000 UNIT/ML IJ SOLN
INTRAMUSCULAR | Status: DC | PRN
  Administered 2016-02-28: 4500 [IU] via INTRAVENOUS
  Administered 2016-02-28: 3000 [IU] via INTRAVENOUS

## 2016-02-28 MED ORDER — SODIUM CHLORIDE 0.9% FLUSH
3.0000 mL | Freq: Two times a day (BID) | INTRAVENOUS | Status: DC
  Administered 2016-02-28: 3 mL via INTRAVENOUS

## 2016-02-28 MED ORDER — FENTANYL CITRATE (PF) 100 MCG/2ML IJ SOLN
INTRAMUSCULAR | Status: DC | PRN
  Administered 2016-02-28 (×2): 25 ug via INTRAVENOUS

## 2016-02-28 MED ORDER — LOSARTAN POTASSIUM 50 MG PO TABS
100.0000 mg | ORAL_TABLET | Freq: Every day | ORAL | Status: DC
  Administered 2016-02-28 – 2016-02-29 (×2): 100 mg via ORAL
  Filled 2016-02-28 (×2): qty 2

## 2016-02-28 MED ORDER — ASPIRIN EC 325 MG PO TBEC
325.0000 mg | DELAYED_RELEASE_TABLET | Freq: Every day | ORAL | Status: DC
  Administered 2016-02-28: 325 mg via ORAL
  Filled 2016-02-28: qty 1

## 2016-02-28 MED ORDER — ONDANSETRON HCL 4 MG/2ML IJ SOLN
4.0000 mg | Freq: Four times a day (QID) | INTRAMUSCULAR | Status: DC | PRN
  Administered 2016-02-28: 22:00:00 4 mg via INTRAVENOUS
  Filled 2016-02-28: qty 2

## 2016-02-28 MED ORDER — SODIUM CHLORIDE 0.9 % WEIGHT BASED INFUSION: 3.0000 mL/kg/h | INTRAVENOUS | Status: DC

## 2016-02-28 MED ORDER — SODIUM CHLORIDE 0.9 % IV SOLN: 250.0000 mL | INTRAVENOUS | Status: DC | PRN

## 2016-02-28 MED ORDER — TICAGRELOR 90 MG PO TABS: 90.0000 mg | ORAL_TABLET | Freq: Two times a day (BID) | ORAL | Status: DC

## 2016-02-28 MED ORDER — SODIUM CHLORIDE 0.9% FLUSH: 3.0000 mL | Freq: Two times a day (BID) | INTRAVENOUS | Status: DC

## 2016-02-28 MED ORDER — ASPIRIN 81 MG PO CHEW: 81.0000 mg | CHEWABLE_TABLET | ORAL | Status: DC

## 2016-02-28 MED ORDER — POTASSIUM CHLORIDE CRYS ER 20 MEQ PO TBCR
40.0000 meq | EXTENDED_RELEASE_TABLET | Freq: Once | ORAL | Status: AC
  Administered 2016-02-28: 40 meq via ORAL
  Filled 2016-02-28: qty 2

## 2016-02-28 MED ORDER — ONDANSETRON HCL 4 MG/2ML IJ SOLN
INTRAMUSCULAR | Status: DC | PRN
  Administered 2016-02-28: 4 mg via INTRAVENOUS

## 2016-02-28 MED ORDER — FENTANYL CITRATE (PF) 100 MCG/2ML IJ SOLN
INTRAMUSCULAR | Status: AC
  Filled 2016-02-28: qty 2

## 2016-02-28 MED ORDER — LIDOCAINE HCL (PF) 1 % IJ SOLN
INTRAMUSCULAR | Status: AC
  Filled 2016-02-28: qty 30

## 2016-02-28 MED ORDER — HEPARIN SODIUM (PORCINE) 1000 UNIT/ML IJ SOLN
INTRAMUSCULAR | Status: AC
  Filled 2016-02-28: qty 1

## 2016-02-28 MED ORDER — NITROGLYCERIN 1 MG/10 ML FOR IR/CATH LAB
INTRA_ARTERIAL | Status: DC | PRN
  Administered 2016-02-28: 200 ug

## 2016-02-28 MED ORDER — IOPAMIDOL (ISOVUE-370) INJECTION 76%
INTRAVENOUS | Status: DC | PRN
  Administered 2016-02-28: 100 mL via INTRA_ARTERIAL

## 2016-02-28 MED ORDER — HYDRALAZINE HCL 20 MG/ML IJ SOLN
10.0000 mg | INTRAMUSCULAR | Status: DC | PRN
Start: 2016-02-28 — End: 2016-02-29

## 2016-02-28 MED ORDER — MIDAZOLAM HCL 2 MG/2ML IJ SOLN
INTRAMUSCULAR | Status: AC
  Filled 2016-02-28: qty 2

## 2016-02-28 MED ORDER — TICAGRELOR 90 MG PO TABS
ORAL_TABLET | ORAL | Status: AC
  Filled 2016-02-28: qty 2

## 2016-02-28 MED ORDER — SODIUM CHLORIDE 0.9 % WEIGHT BASED INFUSION: 3.0000 mL/kg/h | INTRAVENOUS | Status: AC

## 2016-02-28 MED ORDER — LOSARTAN POTASSIUM-HCTZ 100-25 MG PO TABS: 1.0000 | ORAL_TABLET | Freq: Every day | ORAL | Status: DC

## 2016-02-28 MED ORDER — ASPIRIN 81 MG PO CHEW
81.0000 mg | CHEWABLE_TABLET | Freq: Every day | ORAL | Status: DC
  Administered 2016-02-29: 81 mg via ORAL
  Filled 2016-02-28: qty 1

## 2016-02-28 MED ORDER — HYDROCHLOROTHIAZIDE 25 MG PO TABS
25.0000 mg | ORAL_TABLET | Freq: Every day | ORAL | Status: DC
  Administered 2016-02-28 – 2016-02-29 (×2): 25 mg via ORAL
  Filled 2016-02-28 (×2): qty 1

## 2016-02-28 MED ORDER — TICAGRELOR 90 MG PO TABS
90.0000 mg | ORAL_TABLET | Freq: Two times a day (BID) | ORAL | Status: DC
  Administered 2016-02-29: 90 mg via ORAL
  Filled 2016-02-28: qty 1

## 2016-02-28 MED ORDER — TICAGRELOR 90 MG PO TABS
ORAL_TABLET | ORAL | Status: DC | PRN
  Administered 2016-02-28: 180 mg via ORAL

## 2016-02-28 MED ORDER — IOPAMIDOL (ISOVUE-370) INJECTION 76%
INTRAVENOUS | Status: AC
Start: 2016-02-28 — End: 2016-02-28
  Filled 2016-02-28: qty 50

## 2016-02-28 MED ORDER — ENOXAPARIN SODIUM 40 MG/0.4ML ~~LOC~~ SOLN
40.0000 mg | Freq: Every day | SUBCUTANEOUS | Status: DC
  Administered 2016-02-28: 40 mg via SUBCUTANEOUS
  Filled 2016-02-28: qty 0.4

## 2016-02-28 MED ORDER — IOPAMIDOL (ISOVUE-370) INJECTION 76%
INTRAVENOUS | Status: AC
Start: 2016-02-28 — End: 2016-02-28
  Filled 2016-02-28: qty 100

## 2016-02-28 MED ORDER — POTASSIUM CHLORIDE CRYS ER 20 MEQ PO TBCR: 40.0000 meq | EXTENDED_RELEASE_TABLET | Freq: Once | ORAL | Status: DC

## 2016-02-28 MED ORDER — BENZONATATE 100 MG PO CAPS
100.0000 mg | ORAL_CAPSULE | Freq: Three times a day (TID) | ORAL | Status: DC | PRN
  Administered 2016-02-28: 100 mg via ORAL
  Filled 2016-02-28: qty 1

## 2016-02-28 MED ORDER — ASPIRIN 325 MG PO TABS
325.0000 mg | ORAL_TABLET | Freq: Once | ORAL | Status: AC
  Administered 2016-02-28: 325 mg via ORAL
  Filled 2016-02-28: qty 1

## 2016-02-28 MED ORDER — HEPARIN (PORCINE) IN NACL 2-0.9 UNIT/ML-% IJ SOLN
INTRAMUSCULAR | Status: DC | PRN
  Administered 2016-02-28: 1000 mL

## 2016-02-28 MED ORDER — ONDANSETRON HCL 4 MG/2ML IJ SOLN
INTRAMUSCULAR | Status: AC
  Filled 2016-02-28: qty 2

## 2016-02-28 MED ORDER — MIDAZOLAM HCL 2 MG/2ML IJ SOLN
INTRAMUSCULAR | Status: DC | PRN
  Administered 2016-02-28: 1 mg via INTRAVENOUS

## 2016-02-28 MED ORDER — SODIUM CHLORIDE 0.9 % IV SOLN
INTRAVENOUS | Status: DC | PRN
  Administered 2016-02-28: 93 mL/h via INTRAVENOUS

## 2016-02-28 MED ORDER — PROMETHAZINE HCL 25 MG/ML IJ SOLN
12.5000 mg | Freq: Four times a day (QID) | INTRAMUSCULAR | Status: DC | PRN
  Administered 2016-02-28: 12.5 mg via INTRAVENOUS
  Filled 2016-02-28: qty 1

## 2016-02-28 MED ORDER — VERAPAMIL HCL 2.5 MG/ML IV SOLN
INTRAVENOUS | Status: AC
  Filled 2016-02-28: qty 2

## 2016-02-28 MED ORDER — HEPARIN (PORCINE) IN NACL 2-0.9 UNIT/ML-% IJ SOLN
INTRAMUSCULAR | Status: AC
  Filled 2016-02-28: qty 1000

## 2016-02-28 MED ORDER — SODIUM CHLORIDE 0.9 % WEIGHT BASED INFUSION: 1.0000 mL/kg/h | INTRAVENOUS | Status: DC

## 2016-02-28 MED ORDER — OMEGA-3-ACID ETHYL ESTERS 1 G PO CAPS
2000.0000 mg | ORAL_CAPSULE | Freq: Every day | ORAL | Status: DC
  Administered 2016-02-28 – 2016-02-29 (×2): 2000 mg via ORAL
  Filled 2016-02-28 (×2): qty 2

## 2016-02-28 SURGICAL SUPPLY — 22 items
BALLN EMERGE MR 2.5X12 (BALLOONS) ×2
BALLN ~~LOC~~ EMERGE MR 3.25X8 (BALLOONS) ×2
BALLOON EMERGE MR 2.5X12 (BALLOONS) ×1 IMPLANT
BALLOON ~~LOC~~ EMERGE MR 3.25X8 (BALLOONS) ×1 IMPLANT
CATH INFINITI 5 FR JL3.5 (CATHETERS) ×2 IMPLANT
CATH INFINITI 5 FR STR PIGTAIL (CATHETERS) ×2 IMPLANT
CATH INFINITI JR4 5F (CATHETERS) ×2 IMPLANT
COVER PRB 48X5XTLSCP FOLD TPE (BAG) ×1 IMPLANT
COVER PROBE 5X48 (BAG) ×1
DEVICE RAD COMP TR BAND LRG (VASCULAR PRODUCTS) ×2 IMPLANT
GLIDESHEATH SLEND SS 6F .021 (SHEATH) ×2 IMPLANT
GUIDE CATH RUNWAY 6FR AL 75 (CATHETERS) ×2 IMPLANT
KIT ENCORE 26 ADVANTAGE (KITS) ×2 IMPLANT
KIT HEART LEFT (KITS) ×2 IMPLANT
PACK CARDIAC CATHETERIZATION (CUSTOM PROCEDURE TRAY) ×2 IMPLANT
STENT SYNERGY DES 3X12 (Permanent Stent) ×2 IMPLANT
SYR MEDRAD MARK V 150ML (SYRINGE) ×2 IMPLANT
TRANSDUCER W/STOPCOCK (MISCELLANEOUS) ×2 IMPLANT
TUBING CIL FLEX 10 FLL-RA (TUBING) ×2 IMPLANT
WIRE ASAHI PROWATER 180CM (WIRE) ×2 IMPLANT
WIRE HI TORQ VERSACORE-J 145CM (WIRE) ×2 IMPLANT
WIRE SAFE-T 1.5MM-J .035X260CM (WIRE) ×2 IMPLANT

## 2016-02-28 NOTE — ED Provider Notes (Signed)
[facility] DEPT Provider Note   CSN: [id number] Arrival date & time: 02/27/16  2131  By signing my name below, I, Maud Deed. Royston Sinner, attest that this documentation has been prepared under the direction and in the presence of Everlene Balls, MD.  Electronically Signed: Maud Deed. Royston Sinner, ED Scribe. 02/28/16. 1:29 AM.    History   Chief Complaint Chief Complaint  Patient presents with  . Chest Pain   The history is provided by the patient. No language interpreter was used.    HPI Comments: [name] is a 70 y.o. female with a PMHx of HTN and CVA who presents to the Emergency Department complaining of constant, unchanged L sided chest pain that radiates into the back onset earlier this afternoon after lunch while at rest. Pain is described as tightness. No aggravating factors reported. However, pt states discomfort is mildly improved when burping. Currently she is chest pain free. No OTC medications or home remedies attempted prior to arrival. No recent fever, chills, nausea, vomiting, or shortness of breath. No prior history of blood clots. Pt takes a baby ASA daily. However, she is not currently on any major anticoagulants.  PCP: No primary care provider on file.   Past Medical History:  Diagnosis Date  . CVA (cerebral infarction) 1996  . Hypertension     There are no active problems to display for this patient.   Past Surgical History:  Procedure Laterality Date  . BACK SURGERY    . CHOLECYSTECTOMY      OB History    No data available       Home Medications    Prior to Admission medications   Medication Sig Start Date End Date Taking? Authorizing Provider  aspirin EC 81 MG tablet Take 81 mg by mouth daily.    Historical Provider, MD  Cholecalciferol (VITAMIN D PO) Take 1 tablet by mouth daily.    Historical Provider, MD  clindamycin (CLEOCIN) 300 MG capsule Take 1 capsule (300 mg total) by mouth 4 (four) times daily. X 7 days 12/16/13   Veryl Speak, MD    losartan-hydrochlorothiazide (HYZAAR) 100-25 MG per tablet Take 1 tablet by mouth daily. 08/28/13   Historical Provider, MD  Omega-3 Fatty Acids (FISH OIL) 1000 MG CAPS Take 2,000 mg by mouth daily.    Historical Provider, MD  omeprazole (PRILOSEC) 20 MG capsule Take 20 mg by mouth daily.    Historical Provider, MD  Red Yeast Rice 600 MG CAPS Take 1,200 mg by mouth daily.    Historical Provider, MD  vitamin E (VITAMIN E) 400 UNIT capsule Take 400 Units by mouth daily.    Historical Provider, MD    Family History History reviewed. No pertinent family history.  Social History Social History  Substance Use Topics  . Smoking status: Never Smoker  . Smokeless tobacco: Never Used  . Alcohol use No     Allergies   Lotensin [benazepril hcl]; Guaifenesin er; and Propoxyphene   Review of Systems Review of Systems  Constitutional: Negative for chills and fever.  Respiratory: Negative for shortness of breath.   Cardiovascular: Positive for chest pain.  Gastrointestinal: Negative for nausea and vomiting.  Musculoskeletal: Positive for arthralgias and back pain.  Psychiatric/Behavioral: Negative for confusion.  All other systems reviewed and are negative.    Physical Exam Updated Vital Signs BP 141/71 (BP Location: Right Arm)   Pulse 70   Temp 98.3 F (36.8 C)   Resp 16   Ht 5\' 2"  (1.575 m)  Wt 205 lb (93 kg)   SpO2 100%   BMI 37.49 kg/m   Physical Exam  Constitutional: She is oriented to person, place, and time. She appears well-developed and well-nourished. No distress.  HENT:  Head: Normocephalic and atraumatic.  Nose: Nose normal.  Mouth/Throat: Oropharynx is clear and moist. No oropharyngeal exudate.  Eyes: Conjunctivae and EOM are normal. Pupils are equal, round, and reactive to light. No scleral icterus.  Neck: Normal range of motion. Neck supple. No JVD present. No tracheal deviation present. No thyromegaly present.  Cardiovascular: Normal rate, regular rhythm and  normal heart sounds.  Exam reveals no gallop and no friction rub.   No murmur heard. Pulmonary/Chest: Effort normal and breath sounds normal. No respiratory distress. She has no wheezes. She exhibits no tenderness.  Abdominal: Soft. Bowel sounds are normal. She exhibits no distension and no mass. There is no tenderness. There is no rebound and no guarding.  Musculoskeletal: Normal range of motion. She exhibits no edema or tenderness.  Lymphadenopathy:    She has no cervical adenopathy.  Neurological: She is alert and oriented to person, place, and time. No cranial nerve deficit. She exhibits normal muscle tone.  Skin: Skin is warm and dry. No rash noted. No erythema. No pallor.  Nursing note and vitals reviewed.    ED Treatments / Results   DIAGNOSTIC STUDIES: Oxygen Saturation is 100% on RA, Normal by my interpretation.    COORDINATION OF CARE: 1:28 AM- Will order CXR, blood work, and EKG. Discussed treatment plan with pt at bedside and pt agreed to plan.     Labs (all labs ordered are listed, but only abnormal results are displayed) Labs Reviewed  BASIC METABOLIC PANEL - Abnormal; Notable for the following:       Result Value   Potassium 3.4 (*)    Chloride 99 (*)    Glucose, Bld 115 (*)    Creatinine, Ser 1.19 (*)    GFR calc non Af Amer 45 (*)    GFR calc Af Amer 52 (*)    All other components within normal limits  CBC - Abnormal; Notable for the following:    WBC 10.9 (*)    All other components within normal limits  I-STAT TROPOININ, ED    EKG  EKG Interpretation  Date/Time:  Thursday February 27 2016 21:34:28 EDT Ventricular Rate:  75 PR Interval:  152 QRS Duration: 86 QT Interval:  390 QTC Calculation: 435 R Axis:   -12 Text Interpretation:  Normal sinus rhythm Cannot rule out Anterior infarct , age undetermined T wave abnormality, consider lateral ischemia Abnormal ECG new TWI ant leads Confirmed by Glynn Octave [phone]) on 02/28/2016 1:14:29 AM         Radiology Dg Chest 2 View  Result Date: 02/27/2016 CLINICAL DATA:  70 y/o F; chest tightness and discomfort since approximately launch time today. EXAM: CHEST  2 VIEW COMPARISON:  12/16/2013 chest radiograph. FINDINGS: Stable cardiomegaly with left ventricular configuration. Clear lungs. No pneumothorax. No pleural effusion. Mild degenerative changes of the thoracic spine. Right upper quadrant surgical clips, presumably cholecystectomy. IMPRESSION: Clear lungs.  Stable cardiomegaly. Electronically Signed   By: Kristine Garbe M.D.   On: 02/27/2016 22:24    Procedures Procedures (including critical care time)  Medications Ordered in ED Medications - No data to display   Initial Impression / Assessment and Plan / ED Course  I have reviewed the triage vital signs and the nursing notes.  Pertinent labs & imaging  results that were available during my care of the patient were reviewed by me and considered in my medical decision making (see chart for details).  Clinical Course    Patient presents to the ED for chest pain.  History is concerning for ACS.  Troponin is negative.  EKG reveals new TWI in V4-V6.  She was given full dose aspirin in the ED.  Dr. Hal Hope will admit for further care.  Final Clinical Impressions(s) / ED Diagnoses   Final diagnoses:  None    New Prescriptions New Prescriptions   No medications on file   I personally performed the services described in this documentation, which was scribed in my presence. The recorded information has been reviewed and is accurate.      Everlene Balls, MD 02/28/16 [phone]

## 2016-02-28 NOTE — Progress Notes (Signed)
CRITICAL VALUE ALERT  Critical value received:  Troponin is 0.43  Date of notification:  02/28/16  Time of notification:  06:00  Critical value read back:yes  Nurse who received alert:  Emmery Seiler  MD notified (1st page): yes  Time of first page:  06:18  MD notified (2nd page):  Time of second page:  Responding MD: Hal Hope MD  Time MD responded:06:28

## 2016-02-28 NOTE — Consult Note (Signed)
CARDIOLOGY CONSULT NOTE       Patient ID: [name] MRN: [id number] DOB/AGE: 10/22/1945 69 y.o.  Admit date: 02/28/2016 Referring Physician: Erlinda Hong Primary Physician: No primary care provider on file. Primary Cardiologist:  Joie Bimler Reason for Consultation: Chest Pain  Principal Problem:   Chest pain Active Problems:   Essential hypertension   HLD (hyperlipidemia)   HPI:  70 y.o. obese female with HTN, elevated lipids ? Type 2 DM. Admitted with [facility] Troponin positive and ECG abnormal. Has had chest pain with GI overtones for months Worse yesterday about lunch time. Some relief with burping but then worse through night and admitted last night. Pain radiates to back. No palpitations, dyspnea syncope nausea or vomiting. Seen for chest pain 2015 Renaldo normal myovue. Compliant with meds Sedentary   ROS All other systems reviewed and negative except as noted above  Past Medical History:  Diagnosis Date  . CVA (cerebral infarction) 1996  . Hyperlipidemia   . Hypertension     Family History  Problem Relation Age of Onset  . Esophageal cancer Sister   . Lung cancer Brother   . CAD Daughter     Social History   Social History  . Marital status: Married    Spouse name: N/A  . Number of children: N/A  . Years of education: N/A   Occupational History  . Not on file.   Social History Main Topics  . Smoking status: Never Smoker  . Smokeless tobacco: Never Used  . Alcohol use No  . Drug use: No  . Sexual activity: Not on file   Other Topics Concern  . Not on file   Social History Narrative  . No narrative on file    Past Surgical History:  Procedure Laterality Date  . BACK SURGERY    . CHOLECYSTECTOMY    . hysterecytomy       . aspirin EC  325 mg Oral Daily  . enoxaparin (LOVENOX) injection  40 mg Subcutaneous Daily  . losartan  100 mg Oral Daily   And  . hydrochlorothiazide  25 mg Oral Daily  . omega-3 acid ethyl esters  2,000 mg Oral Daily  .  pantoprazole  40 mg Oral Daily      Physical Exam: Blood pressure 129/71, pulse 76, temperature 97.7 F (36.5 C), temperature source Oral, resp. rate 18, height 5\' 2"  (1.575 m), weight 93 kg (205 lb), SpO2 98 %.    Affect appropriate Obese white female HEENT: normal Neck supple with no adenopathy JVP normal no bruits no thyromegaly Lungs clear with no wheezing and good diaphragmatic motion Heart:  S1/S2 no murmur, no rub, gallop or click PMI normal Abdomen: benighn, BS positve, no tenderness, no AAA no bruit.  No HSM or HJR Distal pulses intact with no bruits No edema Neuro non-focal Skin warm and dry No muscular weakness   Labs:   Lab Results  Component Value Date   WBC 8.1 02/28/2016   HGB 10.6 (L) 02/28/2016   HCT 32.8 (L) 02/28/2016   MCV 86.8 02/28/2016   PLT 260 02/28/2016     Recent Labs Lab 02/28/16 0432  NA 137  K 3.1*  CL 101  CO2 29  BUN 12  CREATININE 1.11*  CALCIUM 9.1  PROT 6.4*  BILITOT 0.5  ALKPHOS 66  ALT 12*  AST 17  GLUCOSE 113*   Lab Results  Component Value Date   TROPONINI 0.43 ([place]) 02/28/2016     Radiology: Dg Chest 2  View  Result Date: 02/27/2016 CLINICAL DATA:  70 y/o F; chest tightness and discomfort since approximately launch time today. EXAM: CHEST  2 VIEW COMPARISON:  12/16/2013 chest radiograph. FINDINGS: Stable cardiomegaly with left ventricular configuration. Clear lungs. No pneumothorax. No pleural effusion. Mild degenerative changes of the thoracic spine. Right upper quadrant surgical clips, presumably cholecystectomy. IMPRESSION: Clear lungs.  Stable cardiomegaly. Electronically Signed   By: Kristine Garbe M.D.   On: 02/27/2016 22:24    EKG: SR inferior T wave inversions and loss of R wave across precordium   ASSESSMENT AND PLAN:   Chest Pain: with positive troponin and abnormal ECG Multiple CRF;s  Discussed options with patient Prefer diagnostic cath today. Willing to proceed Risks including stroke MI,  emergent surgery bleeding discussed. Orders written cath lab called  HTN: Well controlled.  Continue current medications and low sodium Dash type diet.    Chol:  Resume statin   Anemia: further w/u primary Dr Melford Aase   Signed: Jenkins Rouge 02/28/2016, 10:38 AM

## 2016-02-28 NOTE — H&P (View-Only) (Signed)
CARDIOLOGY CONSULT NOTE       Patient ID: [name] MRN: [id number] DOB/AGE: 1946/01/15 70 y.o.  Admit date: 02/28/2016 Referring Physician: Erlinda Hong Primary Physician: No primary care provider on file. Primary Cardiologist:  Joie Bimler Reason for Consultation: Chest Pain  Principal Problem:   Chest pain Active Problems:   Essential hypertension   HLD (hyperlipidemia)   HPI:  70 y.o. obese female with HTN, elevated lipids ? Type 2 DM. Admitted with SSCP Troponin positive and ECG abnormal. Has had chest pain with GI overtones for months Worse yesterday about lunch time. Some relief with burping but then worse through night and admitted last night. Pain radiates to back. No palpitations, dyspnea syncope nausea or vomiting. Seen for chest pain 2015 Renaldo normal myovue. Compliant with meds Sedentary   ROS All other systems reviewed and negative except as noted above  Past Medical History:  Diagnosis Date  . CVA (cerebral infarction) 1996  . Hyperlipidemia   . Hypertension     Family History  Problem Relation Age of Onset  . Esophageal cancer Sister   . Lung cancer Brother   . CAD Daughter     Social History   Social History  . Marital status: Married    Spouse name: N/A  . Number of children: N/A  . Years of education: N/A   Occupational History  . Not on file.   Social History Main Topics  . Smoking status: Never Smoker  . Smokeless tobacco: Never Used  . Alcohol use No  . Drug use: No  . Sexual activity: Not on file   Other Topics Concern  . Not on file   Social History Narrative  . No narrative on file    Past Surgical History:  Procedure Laterality Date  . BACK SURGERY    . CHOLECYSTECTOMY    . hysterecytomy       . aspirin EC  325 mg Oral Daily  . enoxaparin (LOVENOX) injection  40 mg Subcutaneous Daily  . losartan  100 mg Oral Daily   And  . hydrochlorothiazide  25 mg Oral Daily  . omega-3 acid ethyl esters  2,000 mg Oral Daily  .  pantoprazole  40 mg Oral Daily      Physical Exam: Blood pressure 129/71, pulse 76, temperature 97.7 F (36.5 C), temperature source Oral, resp. rate 18, height 5\' 2"  (1.575 m), weight 93 kg (205 lb), SpO2 98 %.    Affect appropriate Obese white female HEENT: normal Neck supple with no adenopathy JVP normal no bruits no thyromegaly Lungs clear with no wheezing and good diaphragmatic motion Heart:  S1/S2 no murmur, no rub, gallop or click PMI normal Abdomen: benighn, BS positve, no tenderness, no AAA no bruit.  No HSM or HJR Distal pulses intact with no bruits No edema Neuro non-focal Skin warm and dry No muscular weakness   Labs:   Lab Results  Component Value Date   WBC 8.1 02/28/2016   HGB 10.6 (L) 02/28/2016   HCT 32.8 (L) 02/28/2016   MCV 86.8 02/28/2016   PLT 260 02/28/2016     Recent Labs Lab 02/28/16 0432  NA 137  K 3.1*  CL 101  CO2 29  BUN 12  CREATININE 1.11*  CALCIUM 9.1  PROT 6.4*  BILITOT 0.5  ALKPHOS 66  ALT 12*  AST 17  GLUCOSE 113*   Lab Results  Component Value Date   TROPONINI 0.43 ([place]) 02/28/2016     Radiology: Dg Chest 2  View  Result Date: 02/27/2016 CLINICAL DATA:  70 y/o F; chest tightness and discomfort since approximately launch time today. EXAM: CHEST  2 VIEW COMPARISON:  12/16/2013 chest radiograph. FINDINGS: Stable cardiomegaly with left ventricular configuration. Clear lungs. No pneumothorax. No pleural effusion. Mild degenerative changes of the thoracic spine. Right upper quadrant surgical clips, presumably cholecystectomy. IMPRESSION: Clear lungs.  Stable cardiomegaly. Electronically Signed   By: Kristine Garbe M.D.   On: 02/27/2016 22:24    EKG: SR inferior T wave inversions and loss of R wave across precordium   ASSESSMENT AND PLAN:   Chest Pain: with positive troponin and abnormal ECG Multiple CRF;s  Discussed options with patient Prefer diagnostic cath today. Willing to proceed Risks including stroke MI,  emergent surgery bleeding discussed. Orders written cath lab called  HTN: Well controlled.  Continue current medications and low sodium Dash type diet.    Chol:  Resume statin   Anemia: further w/u primary Dr Melford Aase   Signed: Jenkins Rouge 02/28/2016, 10:38 AM

## 2016-02-28 NOTE — H&P (Signed)
History and Physical    [name] [id number] DOB: February 26, 1946 DOA: 02/28/2016  PCP: No primary care provider on file.  Patient coming from: Home.  Chief Complaint: Chest pain.  HPI: [name] is a 70 y.o. female with hypertension and hyperlipidemia started experiencing chest pain yesterday afternoon. Patient states she fell like a pressure retrosternally with no radiation. She had a burp following which the symptoms improved but persisted. Later on pain also moved to her right shoulder area. Denies any associated shortness of breath productive cough fever or chills. Due to persistent pain patient came to the ER and by then patient's chest pain resolved without any intervention. Blood pressure was found to be elevated. EKG was showing T-wave inversions. Patient is being admitted for further management of chest pain. Patient states she did have a stress test many years ago which was negative.  ED Course: Chest x-ray shows cardiomegaly EKG shows T-wave inversions in the lateral and inferior leads. Troponin was negative.  Review of Systems: As per HPI, rest all negative.   Past Medical History:  Diagnosis Date  . CVA (cerebral infarction) 1996  . Hyperlipidemia   . Hypertension     Past Surgical History:  Procedure Laterality Date  . BACK SURGERY    . CHOLECYSTECTOMY    . hysterecytomy       reports that she has never smoked. She has never used smokeless tobacco. She reports that she does not drink alcohol or use drugs.  Allergies  Allergen Reactions  . Lotensin [Benazepril Hcl]     Took off when patient had stroke  . Guaifenesin Er Palpitations  . Propoxyphene Palpitations    Family History  Problem Relation Age of Onset  . Esophageal cancer Sister   . Lung cancer Brother   . CAD Daughter     Prior to Admission medications   Medication Sig Start Date End Date Taking? Authorizing Provider  aspirin EC 81 MG tablet Take 81 mg by mouth daily.   Yes  Historical Provider, MD  Cholecalciferol (VITAMIN D PO) Take 1 tablet by mouth daily.   Yes Historical Provider, MD  losartan-hydrochlorothiazide (HYZAAR) 100-25 MG per tablet Take 1 tablet by mouth daily. 08/28/13  Yes Historical Provider, MD  Omega-3 Fatty Acids (FISH OIL) 1000 MG CAPS Take 2,000 mg by mouth daily.   Yes Historical Provider, MD  omeprazole (PRILOSEC) 20 MG capsule Take 20 mg by mouth daily.   Yes Historical Provider, MD  clindamycin (CLEOCIN) 300 MG capsule Take 1 capsule (300 mg total) by mouth 4 (four) times daily. X 7 days Patient not taking: Reported on 02/28/2016 12/16/13   Veryl Speak, MD    Physical Exam: Vitals:   02/28/16 0145 02/28/16 0200 02/28/16 0215 02/28/16 0321  BP: 134/83 136/85 139/82 (!) 161/81  Pulse: 62 63 60 70  Resp: 16 19 20 20   Temp:    97.9 F (36.6 C)  TempSrc:    Oral  SpO2: 100% 100% 98% 100%  Weight:      Height:          Constitutional: Not in distress. Vitals:   02/28/16 0145 02/28/16 0200 02/28/16 0215 02/28/16 0321  BP: 134/83 136/85 139/82 (!) 161/81  Pulse: 62 63 60 70  Resp: 16 19 20 20   Temp:    97.9 F (36.6 C)  TempSrc:    Oral  SpO2: 100% 100% 98% 100%  Weight:      Height:  Eyes: Anicteric no pallor. ENMT: No discharge from the ears eyes nose or mouth. Neck: No JVD appreciated. Respiratory: No rhonchi or crepitations. Cardiovascular: S1 and S2 heard. Abdomen: Soft nontender bowel sounds present. Musculoskeletal: No edema. Skin: No rash. Neurologic: Alert awake oriented to time place and person. Moves all extremities. Psychiatric: Appears normal.   Labs on Admission: I have personally reviewed following labs and imaging studies  CBC:  Recent Labs Lab 02/27/16 2142  WBC 10.9*  HGB 12.0  HCT 37.1  MCV 87.7  PLT [id number]   Basic Metabolic Panel:  Recent Labs Lab 02/27/16 2142  NA 135  K 3.4*  CL 99*  CO2 28  GLUCOSE 115*  BUN 12  CREATININE 1.19*  CALCIUM 9.7   GFR: Estimated  Creatinine Clearance: 46.7 mL/min (by C-G formula based on SCr of 1.19 mg/dL (H)). Liver Function Tests: No results for input(s): AST, ALT, ALKPHOS, BILITOT, PROT, ALBUMIN in the last 168 hours. No results for input(s): LIPASE, AMYLASE in the last 168 hours. No results for input(s): AMMONIA in the last 168 hours. Coagulation Profile: No results for input(s): INR, PROTIME in the last 168 hours. Cardiac Enzymes: No results for input(s): CKTOTAL, CKMB, CKMBINDEX, TROPONINI in the last 168 hours. BNP (last 3 results) No results for input(s): PROBNP in the last 8760 hours. HbA1C: No results for input(s): HGBA1C in the last 72 hours. CBG: No results for input(s): GLUCAP in the last 168 hours. Lipid Profile: No results for input(s): CHOL, HDL, LDLCALC, TRIG, CHOLHDL, LDLDIRECT in the last 72 hours. Thyroid Function Tests: No results for input(s): TSH, T4TOTAL, FREET4, T3FREE, THYROIDAB in the last 72 hours. Anemia Panel: No results for input(s): VITAMINB12, FOLATE, FERRITIN, TIBC, IRON, RETICCTPCT in the last 72 hours. Urine analysis: No results found for: COLORURINE, APPEARANCEUR, LABSPEC, PHURINE, GLUCOSEU, HGBUR, BILIRUBINUR, KETONESUR, PROTEINUR, UROBILINOGEN, NITRITE, LEUKOCYTESUR Sepsis Labs: @LABRCNTIP (procalcitonin:4,lacticidven:4) )No results found for this or any previous visit (from the past 240 hour(s)).   Radiological Exams on Admission: Dg Chest 2 View  Result Date: 02/27/2016 CLINICAL DATA:  70 y/o F; chest tightness and discomfort since approximately launch time today. EXAM: CHEST  2 VIEW COMPARISON:  12/16/2013 chest radiograph. FINDINGS: Stable cardiomegaly with left ventricular configuration. Clear lungs. No pneumothorax. No pleural effusion. Mild degenerative changes of the thoracic spine. Right upper quadrant surgical clips, presumably cholecystectomy. IMPRESSION: Clear lungs.  Stable cardiomegaly. Electronically Signed   By: Kristine Garbe M.D.   On: 02/27/2016  22:24    EKG: Independently reviewed. Normal sinus rhythm with inferolateral T-wave inversions.  Assessment/Plan Principal Problem:   Chest pain Active Problems:   Essential hypertension   HLD (hyperlipidemia)    1. Chest pain - given the risk factors including hypertension hyperlipidemia and positive family history with patient's daughter having CAD at age 47 and patient's EKG showing T-wave changes patient will be admitted for further management of chest pain. We'll cycle cardiac markers aspirin check 2-D echo and I have consulted cardiology. 2. Hypertension on ARB and hydrochlorothiazide. Blood pressure was mildly elevated on presentation. Closely follow blood pressure trends. 3. 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 4. Hyperlipidemia - continue present medication.   DVT prophylaxis: Lovenox. Code Status: Full code.  Family Communication: Discussed with patient.  Disposition Plan: Home.  Consults called: Cardiology.  Admission status: Observation.    Rise Patience MD [facility] Pager [phone].  If 7PM-7AM, please contact night-coverage [email or web address] Password [facility]  02/28/2016, 4:06 AM

## 2016-02-28 NOTE — Interval H&P Note (Signed)
History and Physical Interval Note:  02/28/2016 4:44 PM  [name] [name]  has presented today for surgery, with the diagnosis of NSTEMI  The various methods of treatment have been discussed with the patient and family. After consideration of risks, benefits and other options for treatment, the patient has consented to  Procedure(s): Left Heart Cath and Coronary Angiography (N/A) as a surgical intervention .  The patient's history has been reviewed, patient examined, no change in status, stable for surgery.  I have reviewed the patient's chart and labs.  Questions were answered to the patient's satisfaction.   Cath Lab Visit (complete for each Cath Lab visit)  Clinical Evaluation Leading to the Procedure:   ACS: Yes.    Non-ACS:    Anginal Classification: CCS IV  Anti-ischemic medical therapy: Minimal Therapy (1 class of medications)  Non-Invasive Test Results: No non-invasive testing performed  Prior CABG: No previous CABG       Collier Salina [facility] 02/28/2016 4:44 PM

## 2016-02-28 NOTE — Care Management Obs Status (Signed)
[place] NOTIFICATION   Patient Details  Name: [name] MRN: [id number] Date of Birth: Dec 26, 1945   Medicare Observation Status Notification Given:  Yes    Dawayne Patricia, RN 02/28/2016, 3:07 PM

## 2016-02-28 NOTE — Progress Notes (Signed)
Patient seen and examined, report chest pain better, no sob, vital stable, she is npo awaiting for cardiac cath, cardiology input appreciated.

## 2016-02-29 ENCOUNTER — Inpatient Hospital Stay (HOSPITAL_COMMUNITY): Payer: Medicare Other

## 2016-02-29 DIAGNOSIS — E785 Hyperlipidemia, unspecified: Secondary | ICD-10-CM

## 2016-02-29 DIAGNOSIS — R079 Chest pain, unspecified: Secondary | ICD-10-CM

## 2016-02-29 DIAGNOSIS — I10 Essential (primary) hypertension: Secondary | ICD-10-CM

## 2016-02-29 LAB — ECHOCARDIOGRAM COMPLETE
CHL CUP MV DEC (S): 201
EERAT: 7.63
EWDT: 201 ms
FS: 25 % — AB (ref 28–44)
IVS/LV PW RATIO, ED: 0.92
LA diam end sys: 36 mm
LA diam index: 1.86 cm/m2
LA vol A4C: 26.5 ml
LA vol index: 14.9 mL/m2
LASIZE: 36 mm
LAVOL: 29 mL
LV E/e' medial: 7.63
LV TDI E'LATERAL: 9.03
LV e' LATERAL: 9.03 cm/s
LVEEAVG: 7.63
LVOT area: 3.14 cm2
LVOTD: 20 mm
MVPKAVEL: 98 m/s
MVPKEVEL: 68.9 m/s
PW: 13 mm — AB (ref 0.6–1.1)
RV TAPSE: 12.6 mm
TDI e' medial: 5.33
Weight: 3329.83 oz

## 2016-02-29 LAB — LIPID PANEL
CHOL/HDL RATIO: 4.3 ratio
Cholesterol: 159 mg/dL (ref 0–200)
HDL: 37 mg/dL — ABNORMAL LOW (ref >40)
LDL CALC: 109 mg/dL — AB (ref 0–99)
Triglycerides: 65 mg/dL (ref <150)
VLDL: 13 mg/dL (ref 0–40)

## 2016-02-29 LAB — CBC
HEMATOCRIT: 33.3 % — AB (ref 36.0–46.0)
HEMOGLOBIN: 10.6 g/dL — AB (ref 12.0–15.0)
MCH: 28 pg (ref 26.0–34.0)
MCHC: 31.8 g/dL (ref 30.0–36.0)
MCV: 87.9 fL (ref 78.0–100.0)
Platelets: 281 10*3/uL (ref 150–400)
RBC: 3.79 MIL/uL — ABNORMAL LOW (ref 3.87–5.11)
RDW: 14.3 % (ref 11.5–15.5)
WBC: 8.2 10*3/uL (ref 4.0–10.5)

## 2016-02-29 LAB — BASIC METABOLIC PANEL
ANION GAP: 5 (ref 5–15)
BUN: 10 mg/dL (ref 6–20)
CALCIUM: 9.2 mg/dL (ref 8.9–10.3)
CO2: 26 mmol/L (ref 22–32)
Chloride: 105 mmol/L (ref 101–111)
Creatinine, Ser: 1.02 mg/dL — ABNORMAL HIGH (ref 0.44–1.00)
GFR calc Af Amer: >60 mL/min (ref >60)
GFR, EST NON AFRICAN AMERICAN: 54 mL/min — AB (ref >60)
Glucose, Bld: 106 mg/dL — ABNORMAL HIGH (ref 65–99)
POTASSIUM: 3.8 mmol/L (ref 3.5–5.1)
SODIUM: 136 mmol/L (ref 135–145)

## 2016-02-29 LAB — MAGNESIUM: Magnesium: 1.8 mg/dL (ref 1.7–2.4)

## 2016-02-29 LAB — TSH: TSH: 1.601 u[IU]/mL (ref 0.350–4.500)

## 2016-02-29 MED ORDER — METOPROLOL SUCCINATE ER 25 MG PO TB24: 25.0000 mg | ORAL_TABLET | Freq: Every day | ORAL | 0 refills | Status: DC

## 2016-02-29 MED ORDER — TICAGRELOR 90 MG PO TABS: 90.0000 mg | ORAL_TABLET | Freq: Two times a day (BID) | ORAL | 0 refills | Status: DC

## 2016-02-29 MED ORDER — ATORVASTATIN CALCIUM 80 MG PO TABS: 80.0000 mg | ORAL_TABLET | Freq: Every day | ORAL | 0 refills | Status: DC

## 2016-02-29 MED ORDER — PERFLUTREN LIPID MICROSPHERE
1.0000 mL | INTRAVENOUS | Status: AC | PRN
  Administered 2016-02-29: 11:00:00 2 mL via INTRAVENOUS
  Filled 2016-02-29 (×2): qty 10

## 2016-02-29 MED ORDER — HEART ATTACK BOUNCING BOOK
Freq: Once | Status: AC
  Administered 2016-02-29: 07:00:00
  Filled 2016-02-29: qty 1

## 2016-02-29 MED ORDER — ANGIOPLASTY BOOK
Freq: Once | Status: AC
Start: 2016-02-29 — End: 2016-02-29
  Administered 2016-02-29: 07:00:00
  Filled 2016-02-29: qty 1

## 2016-02-29 MED ORDER — LORAZEPAM 0.5 MG PO TABS
0.5000 mg | ORAL_TABLET | Freq: Once | ORAL | Status: AC
  Administered 2016-02-29: 01:00:00 0.5 mg via ORAL
  Filled 2016-02-29: qty 1

## 2016-02-29 NOTE — Progress Notes (Signed)
Patient Name: ALEXICA SAHU Date of Encounter: 02/29/2016  Primary Cardiologist: New - Dr. [facility] Problem List     Principal Problem:   Chest pain Active Problems:   Essential hypertension   HLD (hyperlipidemia)   NSTEMI (non-ST elevated myocardial infarction) (HCC)     Subjective  Feels well, denies chest pain and SOB.   Inpatient Medications    Scheduled Meds: . aspirin  81 mg Oral Daily  . losartan  100 mg Oral Daily   And  . hydrochlorothiazide  25 mg Oral Daily  . omega-3 acid ethyl esters  2,000 mg Oral Daily  . pantoprazole  40 mg Oral Daily  . potassium chloride  40 mEq Oral Once  . sodium chloride flush  3 mL Intravenous Q12H  . ticagrelor  90 mg Oral BID   Continuous Infusions:  PRN Meds:.sodium chloride, acetaminophen, benzonatate, hydrALAZINE, ondansetron (ZOFRAN) IV, promethazine, sodium chloride flush   Vital Signs    Vitals:   02/28/16 1900 02/28/16 1930 02/29/16 0432 02/29/16 0716  BP: (!) 112/92 126/64 (!) 150/66 (!) 140/93  Pulse: 70 71 71 67  Resp: 17 15 20 14   Temp:  97.7 F (36.5 C) 97.4 F (36.3 C) 98.1 F (36.7 C)  TempSrc:  Oral Axillary Oral  SpO2: 96% 98% 99% 99%  Weight:   208 lb 1.8 oz (94.4 kg)   Height:        Intake/Output Summary (Last 24 hours) at 02/29/16 0821 Last data filed at 02/29/16 0700  Gross per 24 hour  Intake           498.75 ml  Output              900 ml  Net          -401.25 ml   Filed Weights   02/27/16 2137 02/28/16 1043 02/29/16 0432  Weight: 205 lb (93 kg) 204 lb (92.5 kg) 208 lb 1.8 oz (94.4 kg)    Physical Exam   GEN: Well nourished, well developed, in no acute distress.  HEENT: Grossly normal.  Neck: Supple, no JVD, carotid bruits, or masses. Cardiac: RRR, no murmurs, rubs, or gallops. No clubbing, cyanosis, edema.  Radials/DP/PT 2+ and equal bilaterally.  Respiratory:  Respirations regular and unlabored, clear to auscultation bilaterally. GI: Soft, nontender, nondistended,  BS + x 4. MS: no deformity or atrophy. Skin: warm and dry, no rash. Neuro:  Strength and sensation are intact. Psych: AAOx3.  Normal affect.  Labs    CBC  Recent Labs  02/28/16 0432 02/29/16 0556  WBC 8.1 8.2  NEUTROABS 4.4  --   HGB 10.6* 10.6*  HCT 32.8* 33.3*  MCV 86.8 87.9  PLT 260 [id number]   Basic Metabolic Panel  Recent Labs  02/28/16 0432 02/29/16 0556  NA 137 136  K 3.1* 3.8  CL 101 105  CO2 29 26  GLUCOSE 113* 106*  BUN 12 10  CREATININE 1.11* 1.02*  CALCIUM 9.1 9.2  MG  --  1.8   Liver Function Tests  Recent Labs  02/28/16 0432  AST 17  ALT 12*  ALKPHOS 66  BILITOT 0.5  PROT 6.4*  ALBUMIN 3.1*   Cardiac Enzymes  Recent Labs  02/28/16 0432 02/28/16 1017  TROPONINI 0.43* 0.55*   Fasting Lipid Panel  Recent Labs  02/29/16 0556  CHOL 159  HDL 37*  LDLCALC 109*  TRIG 65  CHOLHDL 4.3   Thyroid Function Tests  Recent Labs  02/29/16 0556  TSH 1.601    Telemetry    NSR, ST- Personally Reviewed  ECG     NSR, t wave inversion in inferior leads, unchanged from prior EKG.- Personally Reviewed  Radiology    Dg Chest 2 View  Result Date: 02/27/2016 CLINICAL DATA:  70 y/o F; chest tightness and discomfort since approximately launch time today. EXAM: CHEST  2 VIEW COMPARISON:  12/16/2013 chest radiograph. FINDINGS: Stable cardiomegaly with left ventricular configuration. Clear lungs. No pneumothorax. No pleural effusion. Mild degenerative changes of the thoracic spine. Right upper quadrant surgical clips, presumably cholecystectomy. IMPRESSION: Clear lungs.  Stable cardiomegaly. Electronically Signed   By: Kristine Garbe M.D.   On: 02/27/2016 22:24     Cardiac Studies   Coronary Stent Intervention  Left Heart Cath and Coronary Angiography   Prox RCA to Mid RCA lesion, 20 %stenosed.  Prox LAD lesion, 25 %stenosed.  Mid LAD lesion, 30 %stenosed.  Prox Cx to Mid Cx lesion, 15 %stenosed.  A STENT SYNERGY DES 3X12 drug  eluting stent was successfully placed.  Dist RCA lesion, 95 %stenosed.  Post intervention, there is a 0% residual stenosis.  LV end diastolic pressure is normal.   1. 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 Normal LV EDP 3. Successful stenting of the distal RCA with a DES.  Plan: DAPT for one year. Check LV function with Echo. Anticipate DC tomorrow.     Patient Profile  Ms. Luebbe is a 70 year old female with a past medical history of HTN, HLD, and CVA. She presented as a NSTEMI. Left heart cath showed distal RCA lesion that was 95% stenosed. Treated with DES.   Assessment & Plan    1. Single vessel CAD: s/p successful stenting to her distal RCA with DES. She will need DAPT for one year with ASA and Brilinta.   She is chest pain free. Has walked with cardiac rehab. She needs echo to check LV function per cath note.    Would add beta blocker, metoprolol 12.5mg  daily to start. Continue ARB and HCTZ.   2. HTN: hypertensive at times, would add beta blocker.   3. HLD: LDL is 109. Add high intensity statin - atorvastatin 80mg  daily.      Signed, Arbutus Leas, NP  02/29/2016, 8:21 AM

## 2016-02-29 NOTE — Discharge Summary (Signed)
Discharge Summary  [name] [id number] DOB: 1945/12/08  PCP: No primary care provider on file.  Admit date: 02/28/2016 Discharge date: 02/29/2016  Time spent: <79mins  Recommendations for Outpatient Follow-up:  1. F/u with PMD within a week  for hospital discharge follow up, repeat cbc/bmp at follow up 2. F/u with cardiology  Discharge Diagnoses:  Active Hospital Problems   Diagnosis Date Noted  . Chest pain 02/28/2016  . Essential hypertension 02/28/2016  . HLD (hyperlipidemia) 02/28/2016  . NSTEMI (non-ST elevated myocardial infarction) [facility])     Resolved Hospital Problems   Diagnosis Date Noted Date Resolved  No resolved problems to display.    Discharge Condition: stable  Diet recommendation: heart healthy  Filed Weights   02/27/16 2137 02/28/16 1043 02/29/16 0432  Weight: 93 kg (205 lb) 92.5 kg (204 lb) 94.4 kg (208 lb 1.8 oz)    History of present illness:  Patient coming from: Home.  Chief Complaint: Chest pain.  HPI: [name] is a 70 y.o. female with hypertension and hyperlipidemia started experiencing chest pain yesterday afternoon. Patient states she fell like a pressure retrosternally with no radiation. She had a burp following which the symptoms improved but persisted. Later on pain also moved to her right shoulder area. Denies any associated shortness of breath productive cough fever or chills. Due to persistent pain patient came to the ER and by then patient's chest pain resolved without any intervention. Blood pressure was found to be elevated. EKG was showing T-wave inversions. Patient is being admitted for further management of chest pain. Patient states she did have a stress test many years ago which was negative.  ED Course: Chest x-ray shows cardiomegaly EKG shows T-wave inversions in the lateral and inferior leads. First set of Troponin wqas negative, however , troponin increased on repeat testing, cardiology consulted, patient  was brought to cath labs and under pci with DES to distal RCA, she is to be on DAPT with ass and brilinta for a year per cardiology.  Hospital Course:  Principal Problem:   Chest pain Active Problems:   Essential hypertension   HLD (hyperlipidemia)   NSTEMI (non-ST elevated myocardial infarction) (HCC)   Chest pain/NSTEMI -  EKG showing T-wave changes, First set of Troponin wqas negative, however , troponin increased on repeat testing, cardiology consulted, patient was brought to cath labs and under pci with DES to distal RCA, she is to be on DAPT with ass and brilinta for a year per cardiology. She is started on low dose betablocker and high dose statin per cardiology recommendation. Echo unremarkable, patient is cleared to discharge home by cardiology.  Hypertension Continue on home meds ARB and hydrochlorothiazide.low dose betablocker started from this hospitalization.  HLD; continue home meds fish oil, high dose statin started from this hospitalization per cardiology recommendation.  Blood pressure was mildly elevated on presentation. Closely follow blood pressure trends.  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 Code Status: Full code.  Family Communication: Discussed with patient and family in room Disposition Plan: Home with cardiology clearance.  Consults called: Cardiology.   Procedures:  Cardiac cath with DES to distal RCA on 9/22  Consultations:  cardiology  Discharge Exam: BP (!) 140/93 (BP Location: Left Arm)   Pulse 67   Temp 98.1 F (36.7 C) (Oral)   Resp 14   Ht 5\' 2"  (1.575 m)  Wt 94.4 kg (208 lb 1.8 oz)   SpO2 99%   BMI 38.06 kg/m   General: NAD Cardiovascular: RRR Respiratory: CTABL Extremity: no edema  Discharge Instructions You were cared for by a hospitalist during your hospital stay. If you have any questions about your  discharge medications or the care you received while you were in the hospital after you are discharged, you can call the unit and asked to speak with the hospitalist on call if the hospitalist that took care of you is not available. Once you are discharged, your primary care physician will handle any further medical issues. Please note that NO REFILLS for any discharge medications will be authorized once you are discharged, as it is imperative that you return to your primary care physician (or establish a relationship with a primary care physician if you do not have one) for your aftercare needs so that they can reassess your need for medications and monitor your lab values.  Discharge Instructions    AMB Referral to Cardiac Rehabilitation - Phase II    Complete by:  As directed    Diagnosis:  NSTEMI   Amb Referral to Cardiac Rehabilitation    Complete by:  As directed    Diagnosis:   Coronary Stents NSTEMI     Diet - low sodium heart healthy    Complete by:  As directed    Increase activity slowly    Complete by:  As directed        Medication List    STOP taking these medications   clindamycin 300 MG capsule Commonly known as:  CLEOCIN     TAKE these medications   aspirin EC 81 MG tablet Take 81 mg by mouth daily. Notes to patient:  Prevents clotting in stent and heart attack   atorvastatin 80 MG tablet Commonly known as:  LIPITOR Take 1 tablet (80 mg total) by mouth daily. Notes to patient:  Cholesterol    Fish Oil 1000 MG Caps Take 2,000 mg by mouth daily.   losartan-hydrochlorothiazide 100-25 MG tablet Commonly known as:  HYZAAR Take 1 tablet by mouth daily. Notes to patient:  Blood pressure and weak fluid pill   metoprolol succinate 25 MG 24 hr tablet Commonly known as:  TOPROL XL Take 1 tablet (25 mg total) by mouth daily.   omeprazole 20 MG capsule Commonly known as:  PRILOSEC Take 20 mg by mouth daily. Notes to patient:  Acid reflux/heartburn   ticagrelor  90 MG Tabs tablet Commonly known as:  BRILINTA Take 1 tablet (90 mg total) by mouth 2 (two) times daily. Please contact cardiology Dr Radford Pax 's office for prior authorization or refill request. Notes to patient:  Prevents clotting in stent and heart attack   VITAMIN D PO Take 1 tablet by mouth daily. Notes to patient:  Supplement       Allergies  Allergen Reactions  . Lotensin [Benazepril Hcl]     Took off when patient had stroke  . Guaifenesin Er Palpitations  . Propoxyphene Palpitations   Follow-up Information    BADGER,MICHAEL C, MD Follow up in 2 week(s).   Specialty:  Family Medicine Why:  hospital discharge follow up Contact information: [address] [place] [address] [phone]            The results of significant diagnostics from this hospitalization (including imaging, microbiology, ancillary and laboratory) are listed below for reference.    Significant Diagnostic Studies: Dg Chest 2 View  Result Date:  02/27/2016 CLINICAL DATA:  70 y/o F; chest tightness and discomfort since approximately launch time today. EXAM: CHEST  2 VIEW COMPARISON:  12/16/2013 chest radiograph. FINDINGS: Stable cardiomegaly with left ventricular configuration. Clear lungs. No pneumothorax. No pleural effusion. Mild degenerative changes of the thoracic spine. Right upper quadrant surgical clips, presumably cholecystectomy. IMPRESSION: Clear lungs.  Stable cardiomegaly. Electronically Signed   By: Kristine Garbe M.D.   On: 02/27/2016 22:24    Microbiology: No results found for this or any previous visit (from the past 240 hour(s)).   Labs: Basic Metabolic Panel:  Recent Labs Lab 02/27/16 2142 02/28/16 0432 02/29/16 0556  NA 135 137 136  K 3.4* 3.1* 3.8  CL 99* 101 105  CO2 28 29 26   GLUCOSE 115* 113* 106*  BUN 12 12 10   CREATININE 1.19* 1.11* 1.02*  CALCIUM 9.7 9.1 9.2  MG  --   --  1.8   Liver Function Tests:  Recent Labs Lab 02/28/16 0432  AST  17  ALT 12*  ALKPHOS 66  BILITOT 0.5  PROT 6.4*  ALBUMIN 3.1*   No results for input(s): LIPASE, AMYLASE in the last 168 hours. No results for input(s): AMMONIA in the last 168 hours. CBC:  Recent Labs Lab 02/27/16 2142 02/28/16 0432 02/29/16 0556  WBC 10.9* 8.1 8.2  NEUTROABS  --  4.4  --   HGB 12.0 10.6* 10.6*  HCT 37.1 32.8* 33.3*  MCV 87.7 86.8 87.9  PLT 343 260 281   Cardiac Enzymes:  Recent Labs Lab 02/28/16 0432 02/28/16 1017  TROPONINI 0.43* 0.55*   BNP: BNP (last 3 results) No results for input(s): BNP in the last 8760 hours.  ProBNP (last 3 results) No results for input(s): PROBNP in the last 8760 hours.  CBG: No results for input(s): GLUCAP in the last 168 hours.     SignedFlorencia Reasons MD, PhD  [facility] 02/29/2016, 2:01 PM

## 2016-02-29 NOTE — Progress Notes (Signed)
  Echocardiogram 2D Echocardiogram with Definity has been performed.  Jennette Dubin 02/29/2016, 11:41 AM

## 2016-02-29 NOTE — Progress Notes (Signed)
2D echo with normal LVF and mildly reduced RVF.  OK to d/c home

## 2016-02-29 NOTE — Progress Notes (Signed)
CARDIAC REHAB PHASE I   PRE:  Rate/Rhythm: 97 SR  BP:  Supine:   Sitting: 148/87  Standing:    SaO2: 99% RA  MODE:  Ambulation: 600 ft   POST:  Rate/Rhythm: 107 ST  BP:  Supine:   Sitting: 160/75  Standing:    SaO2: 100% RA  [id number] Pt tolerated ambulation well with assist x1, gait slow, steady. To bedside after walk, VSS. MI/PCI education complete including restrictions, risk factor modification, CP, NTG use and calling 911, heart healthy diet and activity progressions given. Discussed Phase 2 cardiac rehab, and pt is interested in the program at [facility], will send referral. Pt voices understanding of information given.  Seward Carol

## 2016-03-02 ENCOUNTER — Encounter (HOSPITAL_COMMUNITY): Payer: Self-pay | Admitting: Cardiology

## 2016-03-13 ENCOUNTER — Ambulatory Visit (INDEPENDENT_AMBULATORY_CARE_PROVIDER_SITE_OTHER): Payer: Medicare Other | Admitting: Physician Assistant

## 2016-03-13 ENCOUNTER — Encounter: Payer: Self-pay | Admitting: Physician Assistant

## 2016-03-13 ENCOUNTER — Other Ambulatory Visit: Payer: Self-pay | Admitting: Physician Assistant

## 2016-03-13 VITALS — BP 108/68 | HR 71 | Ht 62.0 in | Wt 199.8 lb

## 2016-03-13 DIAGNOSIS — I10 Essential (primary) hypertension: Secondary | ICD-10-CM | POA: Diagnosis not present

## 2016-03-13 DIAGNOSIS — I214 Non-ST elevation (NSTEMI) myocardial infarction: Secondary | ICD-10-CM | POA: Diagnosis not present

## 2016-03-13 DIAGNOSIS — E785 Hyperlipidemia, unspecified: Secondary | ICD-10-CM

## 2016-03-13 DIAGNOSIS — Z23 Encounter for immunization: Secondary | ICD-10-CM | POA: Diagnosis not present

## 2016-03-13 DIAGNOSIS — E876 Hypokalemia: Secondary | ICD-10-CM

## 2016-03-13 DIAGNOSIS — I25810 Atherosclerosis of coronary artery bypass graft(s) without angina pectoris: Secondary | ICD-10-CM

## 2016-03-13 LAB — BASIC METABOLIC PANEL
BUN: 14 mg/dL (ref 7–25)
CALCIUM: 9.6 mg/dL (ref 8.6–10.4)
CO2: 29 mmol/L (ref 20–31)
CREATININE: 1.18 mg/dL — AB (ref 0.60–0.93)
Chloride: 100 mmol/L (ref 98–110)
Glucose, Bld: 96 mg/dL (ref 65–99)
Potassium: 3.5 mmol/L (ref 3.5–5.3)
SODIUM: 139 mmol/L (ref 135–146)

## 2016-03-13 MED ORDER — METOPROLOL SUCCINATE ER 25 MG PO TB24: 12.5000 mg | ORAL_TABLET | Freq: Every day | ORAL | 5 refills | Status: DC

## 2016-03-13 NOTE — Patient Instructions (Addendum)
Medication Instructions:   START TAKING METOPROLOL XL  12.5 MG DAILY    If you need a refill on your cardiac medications before your next appointment, please call your pharmacy.  Labwork: BMET  TODAY   RETURN IN 2 TO 3 MONTHS FOR FASTING LIPIDS AND  LFT ON SAME DAY AS FOLLOW UP WITH DR Martinique    Testing/Procedures: NONE ORDER TODAY    Follow-Up: IN 2 TO 3 MONTH WITH DR Martinique    Any Other Special Instructions Will Be Listed Below (If Applicable).

## 2016-03-13 NOTE — Progress Notes (Signed)
Cardiology Office Note    Date:  03/13/2016   ID:  [name], First 1945-11-05, MRN [id number]  PCP:  Dr. Anastasia Pall at [facility]  Cardiologist:  Dr. Martinique (previously Dr. Joie Bimler)   Chief Complaint  Patient presents with  . Follow-up    seen for Dr. Johnsie Cancel, post PCI    History of Present Illness:  [name] is a 70 y.o. female with PMH of HTN, elevated lipids who was admitted recently on 02/28/2016 with substernal chest pain, abnormal EKG and positive troponin. She had chest pain with GI overtones for months. She was seen by Dr. Mauricio Po with [place] in 2015 for chest pain and had normal Myoview. Her troponin was 0.55. After discussing various options with the patient, she opted for definitive diagnosis with cardiac catheterization. She underwent a cardiac catheterization on 02/26/2016 which showed a 20% proximal to mid RCA lesion, 25% proximal LAD lesion, 30% mid LAD lesion, 95% distal RCA lesion treated with Synergy 3 x 12 mm DES, normal LVEDP. Post procedure, patient was placed on aspirin, Brilinta, she was resumed on her losartan. Toprol-XL was added to her medical regimen. Her TSH was normal, lipid panel showed mildly elevated LDL of 109 and low HDL 37. Echocardiogram obtained on 02/29/2016 showed EF 0000000, grade 1 diastolic dysfunction, mild AR, mildly reduced RV EF. She did have hypokalemia on initial arrival with potassium of 3.1, however that was repeated, final potassium prior to discharge was 3.8.  Patient presents today for cardiology follow-up. She has been doing quite well since she left the hospital, she denies any chest discomfort or shortness breath. She has 2 main concerns today. She has significant fatigue since she left the hospital, I think it is likely related to the beta blocker, as a trial, I will cut the Toprol-XL to 12.5 mg daily. She says she also has trouble paying for the Brilinta, I am discussed with [employer] representative and given her  the phone number for the brilinta hotline to see if she qualify for reduced cost program. I will also discuss with Dr. Martinique to see if we can potentially switch her to Plavix afterward if she does not qualify for reduced cost program. If she does change to plavix, I will also change her prilosec to protonix. She will need a two-month fasting lipid and LFT. We have went over her cath report. It is still questionable at this time whether or not the patient has diabetes, according to the patient, she had her recent labs at her PCP, Dr. Fayrene Fearing office in [place] in April. And she has no known history of diabetes. We will try the request of his records to clarify. Otherwise she can follow-up in 2-3 month period  I clarified with the patient to see if she wish to followup with Dr. Mauricio Po, however she says she prefers to followup with Dr. Martinique who did her recent cath instead. She was also given the flu shot today.  Lab results obtained from PCPs office, however it does not appear patient has had recent labs. And I did not see a hemoglobin A1c either. Last set of labs was obtained in June 2017. We will plan to obtain hemoglobin A1c on follow-up.    Past Medical History:  Diagnosis Date  . CVA (cerebral infarction) 1996  . Hyperlipidemia   . Hypertension     Past Surgical History:  Procedure Laterality Date  . BACK SURGERY    . CARDIAC CATHETERIZATION N/A 02/28/2016  Procedure: Left Heart Cath and Coronary Angiography;  Surgeon: Peter M Martinique, MD;  Location: [place] [facility];  Service: Cardiovascular;  Laterality: N/A;  . CARDIAC CATHETERIZATION N/A 02/28/2016   Procedure: Coronary Stent Intervention;  Surgeon: Peter M Martinique, MD;  Location: [place] [facility];  Service: Cardiovascular;  Laterality: N/A;  . CHOLECYSTECTOMY    . hysterecytomy      Current Medications: Outpatient Medications Prior to Visit  Medication Sig Dispense Refill  . aspirin EC 81 MG tablet Take 81 mg by mouth  daily.    Marland Kitchen atorvastatin (LIPITOR) 80 MG tablet Take 1 tablet (80 mg total) by mouth daily. 30 tablet 0  . Cholecalciferol (VITAMIN D PO) Take 1 tablet by mouth daily.    Marland Kitchen losartan-hydrochlorothiazide (HYZAAR) 100-25 MG per tablet Take 1 tablet by mouth daily.    . Omega-3 Fatty Acids (FISH OIL) 1000 MG CAPS Take 2,000 mg by mouth daily.    Marland Kitchen omeprazole (PRILOSEC) 20 MG capsule Take 20 mg by mouth daily.    . ticagrelor (BRILINTA) 90 MG TABS tablet Take 1 tablet (90 mg total) by mouth 2 (two) times daily. Please contact cardiology Dr Radford Pax 's office for prior authorization or refill request. 60 tablet 0  . metoprolol succinate (TOPROL XL) 25 MG 24 hr tablet Take 1 tablet (25 mg total) by mouth daily. 30 tablet 0   No facility-administered medications prior to visit.      Allergies:   Lotensin [benazepril hcl]; Guaifenesin er; and Propoxyphene   Social History   Social History  . Marital status: Married    Spouse name: N/A  . Number of children: N/A  . Years of education: N/A   Social History Main Topics  . Smoking status: Never Smoker  . Smokeless tobacco: Never Used  . Alcohol use No  . Drug use: No  . Sexual activity: Not Asked   Other Topics Concern  . None   Social History Narrative  . None     Family History:  The patient's family history includes CAD in her daughter; Esophageal cancer in her sister; Lung cancer in her brother.   ROS:   Please see the history of present illness.    ROS All other systems reviewed and are negative.   PHYSICAL EXAM:   VS:  BP 108/68   Pulse 71   Ht 5\' 2"  (1.575 m)   Wt 199 lb 12.8 oz (90.6 kg)   BMI 36.54 kg/m    GEN: Well nourished, well developed, in no acute distress  HEENT: normal  Neck: no JVD, carotid bruits, or masses Cardiac: RRR; no murmurs, rubs, or gallops,no edema  Respiratory:  clear to auscultation bilaterally, normal work of breathing GI: soft, nontender, nondistended, + BS MS: no deformity or atrophy  Skin:  warm and dry, no rash Neuro:  Alert and Oriented x 3, Strength and sensation are intact Psych: euthymic mood, full affect  Wt Readings from Last 3 Encounters:  03/13/16 199 lb 12.8 oz (90.6 kg)  02/29/16 208 lb 1.8 oz (94.4 kg)      Studies/Labs Reviewed:   EKG:  EKG is  ordered today.  The ekg ordered today demonstrates normal sinus rhythm with T-wave inversion in lead 2, 3, aVF, V3 through V6, unchanged compared to EKG prior to discharge.  Recent Labs: 02/28/2016: ALT 12 02/29/2016: Hemoglobin 10.6; Magnesium 1.8; Platelets 281; TSH 1.601 03/13/2016: BUN 14; Creat 1.18; Potassium 3.5; Sodium 139   Lipid Panel  Component Value Date/Time   CHOL 159 02/29/2016 0556   TRIG 65 02/29/2016 0556   HDL 37 (L) 02/29/2016 0556   CHOLHDL 4.3 02/29/2016 0556   VLDL 13 02/29/2016 0556   LDLCALC 109 (H) 02/29/2016 0556    Additional studies/ records that were reviewed today include:   Cath 02/28/2016  Conclusion     Prox RCA to Mid RCA lesion, 20 %stenosed.  Prox LAD lesion, 25 %stenosed.  Mid LAD lesion, 30 %stenosed.  Prox Cx to Mid Cx lesion, 15 %stenosed.  A STENT SYNERGY DES 3X12 drug eluting stent was successfully placed.  Dist RCA lesion, 95 %stenosed.  Post intervention, there is a 0% residual stenosis.  LV end diastolic pressure is normal.   1. 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 Normal LV EDP 3. Successful stenting of the distal RCA with a DES.  Plan: DAPT for one year. Check LV function with Echo. Anticipate DC tomorrow.     Echo 02/29/2016 LV EF: 55% -   60%  ------------------------------------------------------------------- Indications:      Chest pain 786.51.  ------------------------------------------------------------------- History:   PMH:   Non-ST-elevated myocardial infarction.  PMH: Stroke.  Risk factors:  Hypertension. Dyslipidemia.  ------------------------------------------------------------------- Study  Conclusions  - Left ventricle: The cavity size was normal. Systolic function was   normal. The estimated ejection fraction was in the range of 55%   to 60%. Wall motion was normal; there were no regional wall   motion abnormalities. There was an increased relative   contribution of atrial contraction to ventricular filling.   Doppler parameters are consistent with abnormal left ventricular   relaxation (grade 1 diastolic dysfunction). - Aortic valve: There was mild regurgitation. - Right ventricle: Systolic function was mildly reduced.   ASSESSMENT:    1. NSTEMI (non-ST elevated myocardial infarction) ([place])   2. 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 Hyperlipidemia, unspecified hyperlipidemia type   4. Essential hypertension   5. Hypokalemia   6. Encounter for immunization      PLAN:  In order of problems listed above:  1. CAD with recent NSTEMI: She has been doing very well from cardiology perspective. She has no angina. I have clarified with Dr. Martinique who is okay for her to be switched to Plavix. I will contact her on Monday to verify with her if she has obtained assistance program from [employer], if she does not qualify, I will send in new prescription of Plavix to her pharmacy including one time 300 mg loading dose of Plavix followed by 75 mg daily thereafter.  - We have reviewed her cath film recently, and emphasized on the need to be compliant with DAPT. If I do change her to Plavix, she will also need a prescription of Protonix to replace her Prilosec which may have interaction with Plavix.  - Although according to the initial hospital note, she has a questionable history of diabetes, however patient firmly denies such history, she says she only had family history of diabetes. Will defer to PCP to check annual hemoglobin A1C. Record from PCPs office was reviewed today, unfortunately she has no  recent hemoglobin A1c, this will need to be followed up by PCP  2. HTN: Blood pressure well-controlled. She is on Hyzaar and Toprol-XL. She does have significant fatigue recently, I think it may be related to the Toprol-XL, Lasix while, I will decrease her Toprol-XL to 12.5 mg daily to see if it  would improve her symptom.  3. HLD: Continue on Lipitor, plan for outpatient fasting lipid panel and LFT on her next follow-up.  4. Hypokalemia: recheck BMET today.   Medication Adjustments/Labs and Tests Ordered: Current medicines are reviewed at length with the patient today.  Concerns regarding medicines are outlined above.  Medication changes, Labs and Tests ordered today are listed in the Patient Instructions below. Patient Instructions  Medication Instructions:   START TAKING METOPROLOL XL  12.5 MG DAILY    If you need a refill on your cardiac medications before your next appointment, please call your pharmacy.  Labwork: BMET  TODAY   RETURN IN 2 TO 3 MONTHS FOR FASTING LIPIDS AND  LFT ON SAME DAY AS FOLLOW UP WITH DR Martinique    Testing/Procedures: NONE ORDER TODAY    Follow-Up: IN 2 TO 3 MONTH WITH DR Martinique    Any Other Special Instructions Will Be Listed Below (If Applicable).                                                                                                                                                      Hilbert Corrigan, [place]  03/13/2016 11:45 PM    [address], [place], [place]  [address] Phone: [phone]; Fax: [phone]

## 2016-03-16 ENCOUNTER — Telehealth: Payer: Self-pay | Admitting: Physician Assistant

## 2016-03-16 ENCOUNTER — Telehealth: Payer: Self-pay | Admitting: *Deleted

## 2016-03-16 ENCOUNTER — Other Ambulatory Visit: Payer: Self-pay | Admitting: Physician Assistant

## 2016-03-16 DIAGNOSIS — E876 Hypokalemia: Secondary | ICD-10-CM

## 2016-03-16 MED ORDER — PANTOPRAZOLE SODIUM 40 MG PO TBEC: 40.0000 mg | DELAYED_RELEASE_TABLET | Freq: Every day | ORAL | 3 refills | Status: DC

## 2016-03-16 MED ORDER — CLOPIDOGREL BISULFATE 300 MG PO TABS: 300.0000 mg | ORAL_TABLET | Freq: Once | ORAL | 0 refills | Status: AC

## 2016-03-16 MED ORDER — CLOPIDOGREL BISULFATE 75 MG PO TABS: 75.0000 mg | ORAL_TABLET | Freq: Every day | ORAL | 3 refills | Status: DC

## 2016-03-16 NOTE — Telephone Encounter (Signed)
I have spoke to Dr. Martinique and the husband, patient is in a [place] and unavailable, decision was to load her with 300mg  plavix once, followed by 75mg  daily plavix thereafter. I have also discontinued her prilosec and switched to protonix. All Rx send to [facility] on [address]

## 2016-03-16 NOTE — Telephone Encounter (Signed)
Spoke with pt states that the [name] will cost her $248/30days she did call to see if she qualified for the patient assistance program and they make too much money. She does have 15 pills left. She would like another rx for something else to replace Berlinta.

## 2016-03-16 NOTE — Addendum Note (Signed)
Addended by: Therisa Doyne on: 03/16/2016 11:32 AM   Modules accepted: Orders

## 2016-03-16 NOTE — Telephone Encounter (Signed)
-----   Message from [place], [place] sent at 03/16/2016  9:05 AM EDT ----- Kidney function stable, potassium borderline low, recommend increase dietary potassium intake via spinach and banana. This low potassium seems to be chronic when review previous labs as her potassium was at current level in 2015. Recheck in 2 weeks.

## 2016-03-16 NOTE — Telephone Encounter (Signed)
Pt aware of her lab results and she will increase her dietary intake with high potassium in it. She will stop by [facility] office 03/27/16 for repeat bmet. Order in [place].

## 2016-03-16 NOTE — Telephone Encounter (Signed)
New message    Pt verbalized that she is returning call for Eulas Post because the medication that he put her on [place]. Pt states that she will need a different prescription for something different because its too expensive. She called the hotline and she do not qualify for the discount. She has 15 pills left and she wants a call back

## 2016-03-27 ENCOUNTER — Other Ambulatory Visit: Payer: Self-pay | Admitting: *Deleted

## 2016-03-27 DIAGNOSIS — E876 Hypokalemia: Secondary | ICD-10-CM

## 2016-03-27 DIAGNOSIS — E785 Hyperlipidemia, unspecified: Secondary | ICD-10-CM

## 2016-03-27 LAB — BASIC METABOLIC PANEL
BUN: 11 mg/dL (ref 7–25)
CHLORIDE: 100 mmol/L (ref 98–110)
CO2: 28 mmol/L (ref 20–31)
Calcium: 9.5 mg/dL (ref 8.6–10.4)
Creat: 1.23 mg/dL — ABNORMAL HIGH (ref 0.60–0.93)
Glucose, Bld: 97 mg/dL (ref 65–99)
POTASSIUM: 4.1 mmol/L (ref 3.5–5.3)
Sodium: 139 mmol/L (ref 135–146)

## 2016-03-30 ENCOUNTER — Telehealth: Payer: Self-pay | Admitting: Physician Assistant

## 2016-03-30 NOTE — Telephone Encounter (Signed)
-----   Message from [place], [place] sent at 03/30/2016  2:01 PM EDT ----- Low potassium level has resolved. Kidney function stable.

## 2016-03-30 NOTE — Telephone Encounter (Signed)
Results given to pt. Pt verbalized understanding.  

## 2016-06-11 ENCOUNTER — Ambulatory Visit: Payer: Medicare Other | Admitting: Cardiology

## 2016-07-01 ENCOUNTER — Other Ambulatory Visit: Payer: Self-pay | Admitting: Obstetrics and Gynecology

## 2016-07-01 DIAGNOSIS — Z1231 Encounter for screening mammogram for malignant neoplasm of breast: Secondary | ICD-10-CM

## 2016-07-09 NOTE — Progress Notes (Signed)
Cardiology Office Note    Date:  07/10/2016   ID:  [name], [name] 1945/11/22, MRN [id number]  PCP:  Dr. Anastasia Pall at [place]  Cardiologist:  Dr. Martinique  Chief Complaint  Patient presents with  . Follow-up  . Coronary Artery Disease    History of Present Illness:  [name] is a 71 y.o. female with PMH of HTN, elevated lipids who was admitted on 02/28/2016 with substernal chest pain, abnormal EKG and positive troponin. She had chest pain with GI overtones for months. She had been seen by Dr. Mauricio Po with [facility] in 2015 for chest pain and had normal Myoview. During her more recent hospital stay her troponin was 0.55. She opted for definitive diagnosis with cardiac catheterization. She underwent a cardiac catheterization on 02/26/2016 which showed a 20% proximal to mid RCA lesion, 25% proximal LAD lesion, 30% mid LAD lesion, 95% distal RCA lesion treated with Synergy 3 x 12 mm DES, normal LVEDP. Post procedure, patient was placed on aspirin, Brilinta, she was resumed on her losartan. Toprol-XL was added to her medical regimen. Her TSH was normal, lipid panel showed mildly elevated LDL of 109 and low HDL 37. Echocardiogram obtained on 02/29/2016 showed EF 0000000, grade 1 diastolic dysfunction, mild AR, mildly reduced RV EF. She did have hypokalemia on initial arrival with potassium of 3.1, however that was repeated, final potassium prior to discharge was 3.8.  On follow up she could not afford Brilinta and was switched to Plavix. Her prilosec was switched to Protonix.  Today she is doing very well. No chest pain or dyspnea. No palpitations. Walks as much as she can but is limited by back pain. Overall feels quite well.   Past Medical History:  Diagnosis Date  . CVA (cerebral infarction) 1996  . Hyperlipidemia   . Hypertension     Past Surgical History:  Procedure Laterality Date  . BACK SURGERY    . CARDIAC CATHETERIZATION N/A 02/28/2016   Procedure: Left Heart  Cath and Coronary Angiography;  Surgeon: Menno Vanbergen M Martinique, MD;  Location: [place] [facility];  Service: Cardiovascular;  Laterality: N/A;  . CARDIAC CATHETERIZATION N/A 02/28/2016   Procedure: Coronary Stent Intervention;  Surgeon: Terrilyn Tyner M Martinique, MD;  Location: [facility];  Service: Cardiovascular;  Laterality: N/A;  . CHOLECYSTECTOMY    . hysterecytomy      Current Medications: Outpatient Medications Prior to Visit  Medication Sig Dispense Refill  . aspirin EC 81 MG tablet Take 81 mg by mouth daily.    Marland Kitchen atorvastatin (LIPITOR) 80 MG tablet Take 1 tablet (80 mg total) by mouth daily. 30 tablet 0  . Cholecalciferol (VITAMIN D PO) Take 1 tablet by mouth daily.    . clopidogrel (PLAVIX) 75 MG tablet Take 1 tablet (75 mg total) by mouth daily. 90 tablet 3  . losartan-hydrochlorothiazide (HYZAAR) 100-25 MG per tablet Take 1 tablet by mouth daily.    . metoprolol succinate (TOPROL XL) 25 MG 24 hr tablet Take 0.5 tablets (12.5 mg total) by mouth daily. 30 tablet 5  . Omega-3 Fatty Acids (FISH OIL) 1000 MG CAPS Take 2,000 mg by mouth daily.    . pantoprazole (PROTONIX) 40 MG tablet Take 1 tablet (40 mg total) by mouth daily. 90 tablet 3   No facility-administered medications prior to visit.      Allergies:   Guaifenesin er; Lotensin [benazepril hcl]; and Propoxyphene   Social History   Social History  . Marital status: Married  Spouse name: N/A  . Number of children: N/A  . Years of education: N/A   Social History Main Topics  . Smoking status: Never Smoker  . Smokeless tobacco: Never Used  . Alcohol use No  . Drug use: No  . Sexual activity: Not Asked   Other Topics Concern  . None   Social History Narrative  . None     Family History:  The patient's family history includes CAD in her daughter; Esophageal cancer in her sister; Lung cancer in her brother.   ROS:   Please see the history of present illness.    ROS All other systems reviewed and are  negative.   PHYSICAL EXAM:   VS:  BP 118/78 (BP Location: Right Arm, Patient Position: Sitting, Cuff Size: Normal)   Pulse 74   Ht 5\' 2"  (1.575 m)   Wt 200 lb (90.7 kg)   BMI 36.58 kg/m    GEN: Well nourished, well developed, in no acute distress  HEENT: normal  Neck: no JVD, carotid bruits, or masses Cardiac: RRR; no murmurs, rubs, or gallops,no edema  Respiratory:  clear to auscultation bilaterally, normal work of breathing GI: soft, nontender, nondistended, + BS MS: no deformity or atrophy  Skin: warm and dry, no rash Neuro:  Alert and Oriented x 3, Strength and sensation are intact Psych: euthymic mood, full affect  Wt Readings from Last 3 Encounters:  07/10/16 200 lb (90.7 kg)  03/13/16 199 lb 12.8 oz (90.6 kg)  02/29/16 208 lb 1.8 oz (94.4 kg)      Studies/Labs Reviewed:   EKG:  EKG is not ordered today.  The ekg ordered today demonstrates N/A  Recent Labs: 02/28/2016: ALT 12 02/29/2016: Hemoglobin 10.6; Magnesium 1.8; Platelets 281; TSH 1.601 03/27/2016: BUN 11; Creat 1.23; Potassium 4.1; Sodium 139   Lipid Panel    Component Value Date/Time   CHOL 159 02/29/2016 0556   TRIG 65 02/29/2016 0556   HDL 37 (L) 02/29/2016 0556   CHOLHDL 4.3 02/29/2016 0556   VLDL 13 02/29/2016 0556   LDLCALC 109 (H) 02/29/2016 0556    Additional studies/ records that were reviewed today include:   Cath 02/28/2016  Conclusion     Prox RCA to Mid RCA lesion, 20 %stenosed.  Prox LAD lesion, 25 %stenosed.  Mid LAD lesion, 30 %stenosed.  Prox Cx to Mid Cx lesion, 15 %stenosed.  A STENT SYNERGY DES 3X12 drug eluting stent was successfully placed.  Dist RCA lesion, 95 %stenosed.  Post intervention, there is a 0% residual stenosis.  LV end diastolic pressure is normal.   1. 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 Normal LV EDP 3. Successful stenting of the distal RCA with a DES.  Plan: DAPT for one year. Check LV function with Echo. Anticipate DC tomorrow.      Echo 02/29/2016 LV EF: 55% -   60%  ------------------------------------------------------------------- Indications:      Chest pain 786.51.  ------------------------------------------------------------------- History:   PMH:   Non-ST-elevated myocardial infarction.  PMH: Stroke.  Risk factors:  Hypertension. Dyslipidemia.  ------------------------------------------------------------------- Study Conclusions  - Left ventricle: The cavity size was normal. Systolic function was   normal. The estimated ejection fraction was in the range of 55%   to 60%. Wall motion was normal; there were no regional wall   motion abnormalities. There was an increased relative   contribution of atrial contraction to ventricular filling.   Doppler parameters are consistent with abnormal left ventricular   relaxation (grade  1 diastolic dysfunction). - Aortic valve: There was mild regurgitation. - Right ventricle: Systolic function was mildly reduced.   ASSESSMENT:    1. Pure hypercholesterolemia   2. Essential hypertension   3. Coronary artery disease of native artery of native heart with stable angina pectoris ([place])      PLAN:  In order of problems listed above:  1. CAD with s/p  NSTEMI in September 2017: s/p DES of the distal RCA. On DAPT for one year. Continue beta blocker.  2. HTN: Blood pressure well-controlled.   3. HLD: Continue on high dose Lipitor and fish oil, plan for outpatient fasting lipid panel and chemistries. It will be easier for her to have this checked with Dr. Melford Aase.    Medication Adjustments/Labs and Tests Ordered: Current medicines are reviewed at length with the patient today.  Concerns regarding medicines are outlined above.  Medication changes, Labs and Tests ordered today are listed in the Patient Instructions below. Patient Instructions  Go ahead and get fasting lab work with Dr. Melford Aase  Continue your current therapy  I will see you In 6 months     Signed, Marlei Glomski Martinique, MD ,[facility] 07/10/2016 2:28 PM    [address], [place], [place]  [address] Phone: [phone]; Fax: [phone]

## 2016-07-10 ENCOUNTER — Encounter: Payer: Self-pay | Admitting: Cardiology

## 2016-07-10 ENCOUNTER — Ambulatory Visit (INDEPENDENT_AMBULATORY_CARE_PROVIDER_SITE_OTHER): Payer: Medicare Other | Admitting: Cardiology

## 2016-07-10 VITALS — BP 118/78 | HR 74 | Ht 62.0 in | Wt 200.0 lb

## 2016-07-10 DIAGNOSIS — I10 Essential (primary) hypertension: Secondary | ICD-10-CM | POA: Diagnosis not present

## 2016-07-10 DIAGNOSIS — E7800 Pure hypercholesterolemia, unspecified: Secondary | ICD-10-CM | POA: Diagnosis not present

## 2016-07-10 DIAGNOSIS — I25118 Atherosclerotic heart disease of native coronary artery with other forms of angina pectoris: Secondary | ICD-10-CM

## 2016-07-10 DIAGNOSIS — I2510 Atherosclerotic heart disease of native coronary artery without angina pectoris: Secondary | ICD-10-CM | POA: Insufficient documentation

## 2016-07-10 DIAGNOSIS — Z9861 Coronary angioplasty status: Secondary | ICD-10-CM | POA: Insufficient documentation

## 2016-07-10 NOTE — Patient Instructions (Signed)
Go ahead and get fasting lab work with Dr. Melford Aase  Continue your current therapy  I will see you In 6 months

## 2016-07-12 DIAGNOSIS — I214 Non-ST elevation (NSTEMI) myocardial infarction: Secondary | ICD-10-CM | POA: Diagnosis present

## 2016-07-27 ENCOUNTER — Ambulatory Visit
Admission: RE | Admit: 2016-07-27 | Discharge: 2016-07-27 | Disposition: A | Discharge status: Home / Self Care | Payer: Medicare Other | Source: Ambulatory Visit | Attending: Obstetrics and Gynecology | Admitting: Obstetrics and Gynecology

## 2016-07-27 DIAGNOSIS — Z1231 Encounter for screening mammogram for malignant neoplasm of breast: Secondary | ICD-10-CM

## 2017-01-20 ENCOUNTER — Encounter: Payer: Self-pay | Admitting: Cardiology

## 2017-02-07 NOTE — Progress Notes (Signed)
Cardiology Office Note    Date:  02/11/2017   ID:  Cassie, Thomas June 10, 1945, MRN [id number]  PCP:  Dr. Anastasia Thomas at [place]  Cardiologist:  Dr. Martinique  Chief Complaint  Patient presents with  . Coronary Artery Disease    History of Present Illness:  [name] is Cassie 71 y.o. female with PMH of HTN, elevated lipids who was admitted on 02/28/2016 with substernal chest pain, abnormal EKG and positive troponin.  Her troponin was 0.55. She opted for definitive diagnosis with cardiac catheterization. She underwent Cassie cardiac catheterization on 02/26/2016 which showed Cassie 20% proximal to mid RCA lesion, 25% proximal LAD lesion, 30% mid LAD lesion, 95% distal RCA lesion treated with Synergy 3 x 12 mm DES, normal LVEDP. Post procedure, patient was placed on aspirin, Brilinta, she was resumed on her losartan. Toprol-XL was added to her medical regimen. Echocardiogram obtained on 02/29/2016 showed EF 98-33%, grade 1 diastolic dysfunction, mild AR, mildly reduced RV EF.  On follow up she could not afford Brilinta and was switched to Plavix. Her prilosec was switched to Protonix.   On follow up today she reports an episode of chest pain 4 weeks ago while riding in car. It was sharp and midsternal. Worse with deep breath. Lasted off and on for 20-30 minutes. No pain since then. This pain quite different from her prior cardiac pain. Walks as much as she can but is limited by back pain. Mainly moves around in house. Overall feels quite well.   Past Medical History:  Diagnosis Date  . CVA (cerebral infarction) 1996  . Hyperlipidemia   . Hypertension     Past Surgical History:  Procedure Laterality Date  . BACK SURGERY    . CARDIAC CATHETERIZATION N/Cassie 02/28/2016   Procedure: Left Heart Cath and Coronary Angiography;  Surgeon: Cassie Glandon M Martinique, MD;  Location: [facility];  Service: Cardiovascular;  Laterality: N/Cassie;  . CARDIAC CATHETERIZATION N/Cassie 02/28/2016   Procedure: Coronary  Stent Intervention;  Surgeon: Anetha Slagel M Martinique, MD;  Location: [facility];  Service: Cardiovascular;  Laterality: N/Cassie;  . CHOLECYSTECTOMY    . hysterecytomy      Current Medications: Outpatient Medications Prior to Visit  Medication Sig Dispense Refill  . aspirin EC 81 MG tablet Take 81 mg by mouth daily.    Marland Kitchen atorvastatin (LIPITOR) 80 MG tablet Take 1 tablet (80 mg total) by mouth daily. 30 tablet 0  . Cholecalciferol (VITAMIN D PO) Take 1 tablet by mouth daily.    . clopidogrel (PLAVIX) 75 MG tablet Take 1 tablet (75 mg total) by mouth daily. 90 tablet 3  . losartan-hydrochlorothiazide (HYZAAR) 100-25 MG per tablet Take 1 tablet by mouth daily.    . metoprolol succinate (TOPROL XL) 25 MG 24 hr tablet Take 0.5 tablets (12.5 mg total) by mouth daily. 30 tablet 5  . Omega-3 Fatty Acids (FISH OIL) 1000 MG CAPS Take 2,000 mg by mouth daily.    . pantoprazole (PROTONIX) 40 MG tablet Take 1 tablet (40 mg total) by mouth daily. 90 tablet 3   No facility-administered medications prior to visit.      Allergies:   Guaifenesin er; Lotensin [benazepril hcl]; and Propoxyphene   Social History   Social History  . Marital status: Married    Spouse name: N/Cassie  . Number of children: N/Cassie  . Years of education: N/Cassie   Social History Main Topics  . Smoking status: Never Smoker  . Smokeless  tobacco: Never Used  . Alcohol use No  . Drug use: No  . Sexual activity: Not Asked   Other Topics Concern  . None   Social History Narrative  . None     Family History:  The patient's family history includes CAD in her daughter; Esophageal cancer in her sister; Lung cancer in her brother.   ROS:   Please see the history of present illness.    ROS All other systems reviewed and are negative.   PHYSICAL EXAM:   VS:  BP (!) 144/78   Pulse 74   Ht 5\' 2"  (1.575 m)   Wt 198 lb 6.4 oz (90 kg)   BMI 36.29 kg/m    GEN: Well nourished, well developed, in no acute distress  HEENT: normal  Neck:  no JVD, carotid bruits, or masses Cardiac: RRR; no murmurs, rubs, or gallops,no edema  Respiratory:  clear to auscultation bilaterally, normal work of breathing GI: soft, nontender, nondistended, + BS MS: no deformity or atrophy  Skin: warm and dry, no rash Neuro:  Alert and Oriented x 3, Strength and sensation are intact Psych: euthymic mood, full affect  Wt Readings from Last 3 Encounters:  02/11/17 198 lb 6.4 oz (90 kg)  07/10/16 200 lb (90.7 kg)  03/13/16 199 lb 12.8 oz (90.6 kg)      Studies/Labs Reviewed:   EKG:  EKG is  ordered today.  The ekg ordered today demonstrates NSR with normal Ecg. I have personally reviewed and interpreted this study.   Recent Labs: 02/28/2016: ALT 12 02/29/2016: Hemoglobin 10.6; Magnesium 1.8; Platelets 281; TSH 1.601 03/27/2016: BUN 11; Creat 1.23; Potassium 4.1; Sodium 139   Lipid Panel    Component Value Date/Time   CHOL 159 02/29/2016 0556   TRIG 65 02/29/2016 0556   HDL 37 (L) 02/29/2016 0556   CHOLHDL 4.3 02/29/2016 0556   VLDL 13 02/29/2016 0556   LDLCALC 109 (H) 02/29/2016 0556   Labs dated 07/23/16: creatinine 1.25. Otherwise CMET normal. Cholesterol 109, triglycerides 58, HDL 44, LDL 53   Additional studies/ records that were reviewed today include:   Cath 02/28/2016  Conclusion     Prox RCA to Mid RCA lesion, 20 %stenosed.  Prox LAD lesion, 25 %stenosed.  Mid LAD lesion, 30 %stenosed.  Prox Cx to Mid Cx lesion, 15 %stenosed.  Cassie STENT SYNERGY DES 3X12 drug eluting stent was successfully placed.  Dist RCA lesion, 95 %stenosed.  Post intervention, there is Cassie 0% residual stenosis.  LV end diastolic pressure is normal.   1. 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 Normal LV EDP 3. Successful stenting of the distal RCA with Cassie DES.  Plan: DAPT for one year. Check LV function with Echo. Anticipate DC tomorrow.     Echo 02/29/2016 LV EF: 55% -    60%  ------------------------------------------------------------------- Indications:      Chest pain 786.51.  ------------------------------------------------------------------- History:   PMH:   Non-ST-elevated myocardial infarction.  PMH: Stroke.  Risk factors:  Hypertension. Dyslipidemia.  ------------------------------------------------------------------- Study Conclusions  - Left ventricle: The cavity size was normal. Systolic function was   normal. The estimated ejection fraction was in the range of 55%   to 60%. Wall motion was normal; there were no regional wall   motion abnormalities. There was an increased relative   contribution of atrial contraction to ventricular filling.   Doppler parameters are consistent with abnormal left ventricular   relaxation (grade 1 diastolic dysfunction). - Aortic valve: There was  mild regurgitation. - Right ventricle: Systolic function was mildly reduced.   ASSESSMENT:    1. Coronary artery disease of native artery of native heart with stable angina pectoris ([place])   2. Pure hypercholesterolemia   3. Essential hypertension      PLAN:  In order of problems listed above:  1. CAD with s/p  NSTEMI in September 2017: s/p DES of the distal RCA. May discontinue Plavix at this time. Continue beta blocker and ASA 81 mg daily.  2. HTN: Blood pressure well-controlled.   3. HLD: Continue on high dose Lipitor and fish oil. Excellent control  4. Atypical chest pain. Only one episode quite different than prior cardiac pain. Ecg is normal. Will monitor for recurrent symptoms.    Medication Adjustments/Labs and Tests Ordered: Current medicines are reviewed at length with the patient today.  Concerns regarding medicines are outlined above.  Medication changes, Labs and Tests ordered today are listed in the Patient Instructions below. Patient Instructions  You may stop Plavix  Continue your other therapy  I will see you in 6 months.     Signed, Joaopedro Eschbach Martinique, MD ,[facility] 02/11/2017 11:37 AM    [address], [place], [place]  [address] Phone: [phone]; Fax: [phone]

## 2017-02-11 ENCOUNTER — Ambulatory Visit (INDEPENDENT_AMBULATORY_CARE_PROVIDER_SITE_OTHER): Payer: Medicare Other | Admitting: Cardiology

## 2017-02-11 ENCOUNTER — Encounter: Payer: Self-pay | Admitting: Cardiology

## 2017-02-11 VITALS — BP 144/78 | HR 74 | Ht 62.0 in | Wt 198.4 lb

## 2017-02-11 DIAGNOSIS — E7800 Pure hypercholesterolemia, unspecified: Secondary | ICD-10-CM | POA: Diagnosis not present

## 2017-02-11 DIAGNOSIS — I10 Essential (primary) hypertension: Secondary | ICD-10-CM | POA: Diagnosis not present

## 2017-02-11 DIAGNOSIS — I25118 Atherosclerotic heart disease of native coronary artery with other forms of angina pectoris: Secondary | ICD-10-CM | POA: Diagnosis not present

## 2017-02-11 NOTE — Patient Instructions (Signed)
You may stop Plavix  Continue your other therapy  I will see you in 6 months.

## 2017-07-13 ENCOUNTER — Other Ambulatory Visit: Payer: Self-pay | Admitting: Obstetrics and Gynecology

## 2017-07-13 DIAGNOSIS — Z1231 Encounter for screening mammogram for malignant neoplasm of breast: Secondary | ICD-10-CM

## 2017-08-02 ENCOUNTER — Ambulatory Visit
Admission: RE | Admit: 2017-08-02 | Discharge: 2017-08-02 | Disposition: A | Discharge status: Home / Self Care | Payer: Medicare Other | Source: Ambulatory Visit | Attending: Obstetrics and Gynecology | Admitting: Obstetrics and Gynecology

## 2017-08-02 DIAGNOSIS — Z1231 Encounter for screening mammogram for malignant neoplasm of breast: Secondary | ICD-10-CM

## 2017-08-13 NOTE — Progress Notes (Deleted)
Cardiology Office Note    Date:  08/13/2017   ID:  [name], [name] [birth year], MRN [id number]  PCP:  Dr. Anastasia Pall  Cardiologist:  Dr. Martinique  No chief complaint on file.   History of Present Illness:  [name] is a 72 y.o. female with PMH of HTN, elevated lipids who was admitted on 02/28/2016 with substernal chest pain, abnormal EKG and positive troponin.  Her troponin was 0.55. She opted for definitive diagnosis with cardiac catheterization. She underwent a cardiac catheterization on 02/26/2016 which showed a 20% proximal to mid RCA lesion, 25% proximal LAD lesion, 30% mid LAD lesion, 95% distal RCA lesion treated with Synergy 3 x 12 mm DES, normal LVEDP. Post procedure, patient was placed on aspirin, Brilinta, she was resumed on her losartan. Toprol-XL was added to her medical regimen. Echocardiogram obtained on 02/29/2016 showed EF 92-11%, grade 1 diastolic dysfunction, mild AR, mildly reduced RV EF.  On follow up she could not afford Brilinta and was switched to Plavix. Her prilosec was switched to Protonix.   On follow up today she reports an episode of chest pain 4 weeks ago while riding in car. It was sharp and midsternal. Worse with deep breath. Lasted off and on for 20-30 minutes. No pain since then. This pain quite different from her prior cardiac pain. Walks as much as she can but is limited by back pain. Mainly moves around in house. Overall feels quite well.   Past Medical History:  Diagnosis Date  . CVA (cerebral infarction) 1996  . Hyperlipidemia   . Hypertension     Past Surgical History:  Procedure Laterality Date  . BACK SURGERY    . CARDIAC CATHETERIZATION N/A 02/28/2016   Procedure: Left Heart Cath and Coronary Angiography;  Surgeon: Sweet Jarvis M Martinique, MD;  Location: [place] [facility];  Service: Cardiovascular;  Laterality: N/A;  . CARDIAC CATHETERIZATION N/A 02/28/2016   Procedure: Coronary Stent Intervention;  Surgeon: Jamari Moten M Martinique, MD;   Location: [place] [facility];  Service: Cardiovascular;  Laterality: N/A;  . CHOLECYSTECTOMY    . hysterecytomy      Current Medications: Outpatient Medications Prior to Visit  Medication Sig Dispense Refill  . aspirin EC 81 MG tablet Take 81 mg by mouth daily.    Marland Kitchen atorvastatin (LIPITOR) 80 MG tablet Take 1 tablet (80 mg total) by mouth daily. 30 tablet 0  . Cholecalciferol (VITAMIN D PO) Take 1 tablet by mouth daily.    . clopidogrel (PLAVIX) 75 MG tablet Take 1 tablet (75 mg total) by mouth daily. 90 tablet 3  . losartan-hydrochlorothiazide (HYZAAR) 100-25 MG per tablet Take 1 tablet by mouth daily.    . metoprolol succinate (TOPROL XL) 25 MG 24 hr tablet Take 0.5 tablets (12.5 mg total) by mouth daily. 30 tablet 5  . Omega-3 Fatty Acids (FISH OIL) 1000 MG CAPS Take 2,000 mg by mouth daily.    . pantoprazole (PROTONIX) 40 MG tablet Take 1 tablet (40 mg total) by mouth daily. 90 tablet 3   No facility-administered medications prior to visit.      Allergies:   Guaifenesin er; Lotensin [benazepril hcl]; and Propoxyphene   Social History   Socioeconomic History  . Marital status: Married    Spouse name: Not on file  . Number of children: Not on file  . Years of education: Not on file  . Highest education level: Not on file  Social Needs  . Financial resource strain: Not on  file  . Food insecurity - worry: Not on file  . Food insecurity - inability: Not on file  . Transportation needs - medical: Not on file  . Transportation needs - non-medical: Not on file  Occupational History  . Not on file  Tobacco Use  . Smoking status: Never Smoker  . Smokeless tobacco: Never Used  Substance and Sexual Activity  . Alcohol use: No  . Drug use: No  . Sexual activity: Not on file  Other Topics Concern  . Not on file  Social History Narrative  . Not on file     Family History:  The patient's family history includes CAD in her daughter; Esophageal cancer in her sister; Lung cancer in  her brother.   ROS:   Please see the history of present illness.    ROS All other systems reviewed and are negative.   PHYSICAL EXAM:   VS:  There were no vitals taken for this visit.   GEN: Well nourished, well developed, in no acute distress  HEENT: normal  Neck: no JVD, carotid bruits, or masses Cardiac: RRR; no murmurs, rubs, or gallops,no edema  Respiratory:  clear to auscultation bilaterally, normal work of breathing GI: soft, nontender, nondistended, + BS MS: no deformity or atrophy  Skin: warm and dry, no rash Neuro:  Alert and Oriented x 3, Strength and sensation are intact Psych: euthymic mood, full affect  Wt Readings from Last 3 Encounters:  02/11/17 198 lb 6.4 oz (90 kg)  07/10/16 200 lb (90.7 kg)  03/13/16 199 lb 12.8 oz (90.6 kg)      Studies/Labs Reviewed:   EKG:  EKG is  ordered today.  The ekg ordered today demonstrates NSR with normal Ecg. I have personally reviewed and interpreted this study.   Recent Labs: No results found for requested labs within last 8760 hours.   Lipid Panel    Component Value Date/Time   CHOL 159 02/29/2016 0556   TRIG 65 02/29/2016 0556   HDL 37 (L) 02/29/2016 0556   CHOLHDL 4.3 02/29/2016 0556   VLDL 13 02/29/2016 0556   LDLCALC 109 (H) 02/29/2016 0556   Labs dated 07/23/16: creatinine 1.25. Otherwise CMET normal. Cholesterol 109, triglycerides 58, HDL 44, LDL 53   Additional studies/ records that were reviewed today include:   Cath 02/28/2016  Conclusion     Prox RCA to Mid RCA lesion, 20 %stenosed.  Prox LAD lesion, 25 %stenosed.  Mid LAD lesion, 30 %stenosed.  Prox Cx to Mid Cx lesion, 15 %stenosed.  A STENT SYNERGY DES 3X12 drug eluting stent was successfully placed.  Dist RCA lesion, 95 %stenosed.  Post intervention, there is a 0% residual stenosis.  LV end diastolic pressure is normal.   1. 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 Normal LV EDP 3. Successful stenting of the distal RCA  with a DES.  Plan: DAPT for one year. Check LV function with Echo. Anticipate DC tomorrow.     Echo 02/29/2016 LV EF: 55% -   60%  ------------------------------------------------------------------- Indications:      Chest pain 786.51.  ------------------------------------------------------------------- History:   PMH:   Non-ST-elevated myocardial infarction.  PMH: Stroke.  Risk factors:  Hypertension. Dyslipidemia.  ------------------------------------------------------------------- Study Conclusions  - Left ventricle: The cavity size was normal. Systolic function was   normal. The estimated ejection fraction was in the range of 55%   to 60%. Wall motion was normal; there were no regional wall   motion abnormalities. There was an  increased relative   contribution of atrial contraction to ventricular filling.   Doppler parameters are consistent with abnormal left ventricular   relaxation (grade 1 diastolic dysfunction). - Aortic valve: There was mild regurgitation. - Right ventricle: Systolic function was mildly reduced.   ASSESSMENT:    No diagnosis found.   PLAN:  In order of problems listed above:  1. CAD with s/p  NSTEMI in September 2017: s/p DES of the distal RCA. May discontinue Plavix at this time. Continue beta blocker and ASA 81 mg daily.  2. HTN: Blood pressure well-controlled.   3. HLD: Continue on high dose Lipitor and fish oil. Excellent control  4. Atypical chest pain. Only one episode quite different than prior cardiac pain. Ecg is normal. Will monitor for recurrent symptoms.    Medication Adjustments/Labs and Tests Ordered: Current medicines are reviewed at length with the patient today.  Concerns regarding medicines are outlined above.  Medication changes, Labs and Tests ordered today are listed in the Patient Instructions below. There are no Patient Instructions on file for this visit.   Signed, Jhana Giarratano Martin[place], MD ,[facility] 08/13/2017 7:47 AM      [address], [place], [place]  [address] Phone: [phone]; Fax: [phone]

## 2017-08-16 ENCOUNTER — Ambulatory Visit: Payer: Medicare Other | Admitting: Cardiology

## 2017-10-10 NOTE — Progress Notes (Signed)
Cardiology Office Note    Date:  10/13/2017   ID:  [name], [name] 04-28-1946, MRN [id number]  PCP:  Dr. Anastasia Pall at [facility]  Cardiologist:  Dr. Martinique  Chief Complaint  Patient presents with  . Follow-up    6 months  . Dizziness  . Shortness of Breath    History of Present Illness:  [name] is a 72 y.o. female with PMH of HTN, elevated lipids who was admitted on 02/28/2016 with substernal chest pain, abnormal EKG and positive troponin.  Her troponin was 0.55. She opted for definitive diagnosis with cardiac catheterization. She underwent a cardiac catheterization on 02/26/2016 which showed a 20% proximal to mid RCA lesion, 25% proximal LAD lesion, 30% mid LAD lesion, 95% distal RCA lesion treated with Synergy 3 x 12 mm DES, normal LVEDP. Post procedure, patient was placed on aspirin, Brilinta, she was resumed on her losartan. Toprol-XL was added to her medical regimen. Echocardiogram obtained on 02/29/2016 showed EF 67-61%, grade 1 diastolic dysfunction, mild AR, mildly reduced RV EF.  On last visit we discontinued  Plavix.   On follow up today she notes that she has marked fatigue now to the point she cannot function. Dr. Melford Aase recommended stopping her lipitor and she noted an immediate improvement. She was then placed on Crestor and symptoms have come back. 3-4 weeks ago she noted some left parasternal pain that would come and go throughout the day. This resolved. It was quite different than her heart attack pain. She did have the flu in March.   Past Medical History:  Diagnosis Date  . CVA (cerebral infarction) 1996  . Hyperlipidemia   . Hypertension     Past Surgical History:  Procedure Laterality Date  . BACK SURGERY    . CARDIAC CATHETERIZATION N/A 02/28/2016   Procedure: Left Heart Cath and Coronary Angiography;  Surgeon: Jaslyn Bansal M Martinique, MD;  Location: [facility];  Service: Cardiovascular;  Laterality: N/A;  . CARDIAC CATHETERIZATION N/A  02/28/2016   Procedure: Coronary Stent Intervention;  Surgeon: Ulla Mckiernan M Martinique, MD;  Location: [facility];  Service: Cardiovascular;  Laterality: N/A;  . CHOLECYSTECTOMY    . hysterecytomy      Current Medications: Outpatient Medications Prior to Visit  Medication Sig Dispense Refill  . aspirin EC 81 MG tablet Take 81 mg by mouth daily.    . Cholecalciferol (VITAMIN D PO) Take 1 tablet by mouth daily.    Marland Kitchen losartan-hydrochlorothiazide (HYZAAR) 100-25 MG per tablet Take 1 tablet by mouth daily.    . Omega-3 Fatty Acids (FISH OIL) 1000 MG CAPS Take 2,000 mg by mouth daily.    . pantoprazole (PROTONIX) 40 MG tablet Take 1 tablet (40 mg total) by mouth daily. 90 tablet 3  . atorvastatin (LIPITOR) 80 MG tablet Take 1 tablet (80 mg total) by mouth daily. 30 tablet 0  . clopidogrel (PLAVIX) 75 MG tablet Take 1 tablet (75 mg total) by mouth daily. 90 tablet 3  . metoprolol succinate (TOPROL XL) 25 MG 24 hr tablet Take 0.5 tablets (12.5 mg total) by mouth daily. 30 tablet 5   No facility-administered medications prior to visit.      Allergies:   Guaifenesin er; Lotensin [benazepril hcl]; and Propoxyphene   Social History   Socioeconomic History  . Marital status: Married    Spouse name: Not on file  . Number of children: Not on file  . Years of education: Not on file  .  Highest education level: Not on file  Occupational History  . Not on file  Social Needs  . Financial resource strain: Not on file  . Food insecurity:    Worry: Not on file    Inability: Not on file  . Transportation needs:    Medical: Not on file    Non-medical: Not on file  Tobacco Use  . Smoking status: Never Smoker  . Smokeless tobacco: Never Used  Substance and Sexual Activity  . Alcohol use: No  . Drug use: No  . Sexual activity: Not on file  Lifestyle  . Physical activity:    Days per week: Not on file    Minutes per session: Not on file  . Stress: Not on file  Relationships  . Social  connections:    Talks on phone: Not on file    Gets together: Not on file    Attends religious service: Not on file    Active member of club or organization: Not on file    Attends meetings of clubs or organizations: Not on file    Relationship status: Not on file  Other Topics Concern  . Not on file  Social History Narrative  . Not on file     Family History:  The patient's family history includes CAD in her daughter; Esophageal cancer in her sister; Lung cancer in her brother.   ROS:   Please see the history of present illness.    ROS All other systems reviewed and are negative.   PHYSICAL EXAM:   VS:  BP 110/60 (BP Location: Left Arm, Patient Position: Sitting, Cuff Size: Large)   Pulse 94   Ht 5\' 2"  (1.575 m)   Wt 198 lb (89.8 kg)   BMI 36.21 kg/m    GENERAL:  Well appearing, obese WF in NAD HEENT:  PERRL, EOMI, sclera are clear. Oropharynx is clear. NECK:  No jugular venous distention, carotid upstroke brisk and symmetric, no bruits, no thyromegaly or adenopathy LUNGS:  Clear to auscultation bilaterally CHEST:  Unremarkable HEART:  RRR,  PMI not displaced or sustained,S1 and S2 within normal limits, no S3, no S4: no clicks, no rubs, no murmurs ABD:  Soft, nontender. BS +, no masses or bruits. No hepatomegaly, no splenomegaly EXT:  2 + pulses throughout, no edema, no cyanosis no clubbing SKIN:  Warm and dry.  No rashes NEURO:  Alert and oriented x 3. Cranial nerves II through XII intact. PSYCH:  Cognitively intact    Wt Readings from Last 3 Encounters:  10/13/17 198 lb (89.8 kg)  02/11/17 198 lb 6.4 oz (90 kg)  07/10/16 200 lb (90.7 kg)      Studies/Labs Reviewed:   EKG:  EKG is not ordered today.    Recent Labs: No results found for requested labs within last 8760 hours.   Lipid Panel    Component Value Date/Time   CHOL 159 02/29/2016 0556   TRIG 65 02/29/2016 0556   HDL 37 (L) 02/29/2016 0556   CHOLHDL 4.3 02/29/2016 0556   VLDL 13 02/29/2016 0556     LDLCALC 109 (H) 02/29/2016 0556   Labs dated 07/23/16: creatinine 1.25. Otherwise CMET normal. Cholesterol 109, triglycerides 58, HDL 44, LDL 53   Additional studies/ records that were reviewed today include:   Cath 02/28/2016  Conclusion     Prox RCA to Mid RCA lesion, 20 %stenosed.  Prox LAD lesion, 25 %stenosed.  Mid LAD lesion, 30 %stenosed.  Prox Cx to Mid Cx lesion,  15 %stenosed.  A STENT SYNERGY DES 3X12 drug eluting stent was successfully placed.  Dist RCA lesion, 95 %stenosed.  Post intervention, there is a 0% residual stenosis.  LV end diastolic pressure is normal.   1. 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 Normal LV EDP 3. Successful stenting of the distal RCA with a DES.  Plan: DAPT for one year. Check LV function with Echo. Anticipate DC tomorrow.     Echo 02/29/2016 LV EF: 55% -   60%  ------------------------------------------------------------------- Indications:      Chest pain 786.51.  ------------------------------------------------------------------- History:   PMH:   Non-ST-elevated myocardial infarction.  PMH: Stroke.  Risk factors:  Hypertension. Dyslipidemia.  ------------------------------------------------------------------- Study Conclusions  - Left ventricle: The cavity size was normal. Systolic function was   normal. The estimated ejection fraction was in the range of 55%   to 60%. Wall motion was normal; there were no regional wall   motion abnormalities. There was an increased relative   contribution of atrial contraction to ventricular filling.   Doppler parameters are consistent with abnormal left ventricular   relaxation (grade 1 diastolic dysfunction). - Aortic valve: There was mild regurgitation. - Right ventricle: Systolic function was mildly reduced.   ASSESSMENT:    1. Coronary artery disease of native artery of native heart with stable angina pectoris ([place])   2. Hyperlipidemia, unspecified  hyperlipidemia type   3. Essential hypertension   4. Pure hypercholesterolemia      PLAN:  In order of problems listed above:  1. CAD with s/p  NSTEMI in September 2017: s/p DES of the distal RCA.  Continue ASA 81 mg daily. Given marked fatigue will stop metoprolol- she is really on a trivial dose anyway.  2. HTN: Blood pressure well-controlled. Continue losartan HCT  3. HLD: intolerance to lipitor and now Crestor. Recommend she stop Crestor now. Will start Zetia 10 mg daily. Keep follow up with Dr. Melford Aase. If LDL not at goal may need to consider a PCSK 9 inhibitor.   4. Atypical chest pain. Only one episode quite different than prior cardiac pain.  Will monitor for recurrent symptoms.    Medication Adjustments/Labs and Tests Ordered: Current medicines are reviewed at length with the patient today.  Concerns regarding medicines are outlined above.  Medication changes, Labs and Tests ordered today are listed in the Patient Instructions below. Patient Instructions  Stop taking metoprolol and Crestor.  Continue your other therapy  We will start Zetia 10 mg daily for cholesterol.  I will see you in 6 months    Signed, Norah Devin Martinique, MD ,[facility] 10/13/2017 10:28 AM    [address], [place], [place]  [address] Phone: [phone]; Fax: [phone]

## 2017-10-13 ENCOUNTER — Ambulatory Visit: Payer: Medicare Other | Admitting: Cardiology

## 2017-10-13 ENCOUNTER — Encounter: Payer: Self-pay | Admitting: Cardiology

## 2017-10-13 VITALS — BP 110/60 | HR 94 | Ht 62.0 in | Wt 198.0 lb

## 2017-10-13 DIAGNOSIS — E7800 Pure hypercholesterolemia, unspecified: Secondary | ICD-10-CM

## 2017-10-13 DIAGNOSIS — I10 Essential (primary) hypertension: Secondary | ICD-10-CM

## 2017-10-13 DIAGNOSIS — E785 Hyperlipidemia, unspecified: Secondary | ICD-10-CM

## 2017-10-13 DIAGNOSIS — I25118 Atherosclerotic heart disease of native coronary artery with other forms of angina pectoris: Secondary | ICD-10-CM | POA: Diagnosis not present

## 2017-10-13 MED ORDER — EZETIMIBE 10 MG PO TABS: 10.0000 mg | ORAL_TABLET | Freq: Every day | ORAL | 3 refills | Status: DC

## 2017-10-13 NOTE — Patient Instructions (Signed)
Stop taking metoprolol and Crestor.  Continue your other therapy  We will start Zetia 10 mg daily for cholesterol.  I will see you in 6 months

## 2017-10-18 ENCOUNTER — Telehealth: Payer: Self-pay | Admitting: Cardiology

## 2017-10-18 NOTE — Telephone Encounter (Signed)
Pt c/o medication issue:  1. Name of Medication:  ezetimibe (ZETIA) 10 MG tablet Take 1 tablet (10 mg total) by mouth daily.   2. How are you currently taking this medication (dosage and times per day)? Take 1 tablet (10 mg total) by mouth daily.  3. Are you having a reaction (difficulty breathing--STAT)? no  4. What is your medication issue? Too expensive is there another medications option

## 2017-10-18 NOTE — Telephone Encounter (Signed)
Left a message to call back.

## 2017-10-19 NOTE — Telephone Encounter (Signed)
Left message to call back  

## 2017-10-19 NOTE — Telephone Encounter (Signed)
Follow up  Pt returning call for Alisha. Please call

## 2017-10-19 NOTE — Telephone Encounter (Signed)
Pt reports that Zetia is too expensive and requesting to see if there are other options that may be available.    Routed to Dr. Martinique

## 2017-10-19 NOTE — Telephone Encounter (Signed)
Waiting on MD to review & advise

## 2017-10-20 ENCOUNTER — Telehealth: Payer: Self-pay | Admitting: Cardiology

## 2017-10-20 NOTE — Telephone Encounter (Signed)
Called patient to schedule appointment with the pharmacist in the lipid clinic.

## 2017-10-20 NOTE — Telephone Encounter (Signed)
Returned call to patient no answer.LMTC. 

## 2017-10-20 NOTE — Telephone Encounter (Signed)
I would have her see our Pharm D to investigate other lipid lowering options.  Peter Martinique MD, [facility]

## 2017-10-20 NOTE — Telephone Encounter (Signed)
Spoke to patient Dr.Jordan's recommendation given.Scheduler will call back to schedule lipid clinic appointment.

## 2017-10-28 ENCOUNTER — Ambulatory Visit: Payer: Medicare Other | Admitting: Pharmacist

## 2017-10-28 ENCOUNTER — Encounter: Payer: Self-pay | Admitting: Pharmacist

## 2017-10-28 DIAGNOSIS — E7800 Pure hypercholesterolemia, unspecified: Secondary | ICD-10-CM

## 2017-10-28 NOTE — Patient Instructions (Signed)
Lipid Clinic (pharmacy) [facility] Wiens/[facility]  [phone]  *START ezetimibe (Zetia) 10mg  daily*  *GOOD rx coupon and card provided today* *REPEAT fasting blood work 6-8 weeks from today*   Cholesterol Cholesterol is a fat. Your body needs a small amount of cholesterol. Cholesterol (plaque) may build up in your blood vessels (arteries). That makes you more likely to have a heart attack or stroke. You cannot feel your cholesterol level. Having a blood test is the only way to find out if your level is high. Keep your test results. Work with your doctor to keep your cholesterol at a good level. What do the results mean?  Total cholesterol is how much cholesterol is in your blood.  LDL is bad cholesterol. This is the type that can build up. Try to have low LDL.  HDL is good cholesterol. It cleans your blood vessels and carries LDL away. Try to have high HDL.  Triglycerides are fat that the body can store or burn for energy. What are good levels of cholesterol?  Total cholesterol below 200.  LDL below 100 is good for people who have health risks. LDL below 70 is good for people who have very high risks.  HDL above 40 is good. It is best to have HDL of 60 or higher.  Triglycerides below 150. How can I lower my cholesterol? Diet Follow your diet program as told by your doctor.  Choose fish, white meat chicken, or [place] that is roasted or baked. Try not to eat red meat, fried foods, sausage, or lunch meats.  Eat lots of fresh fruits and vegetables.  Choose whole grains, beans, pasta, potatoes, and cereals.  Choose olive oil, corn oil, or canola oil. Only use small amounts.  Try not to eat butter, mayonnaise, shortening, or palm kernel oils.  Try not to eat foods with trans fats.  Choose low-fat or nonfat dairy foods. ? Drink skim or nonfat milk. ? Eat low-fat or nonfat yogurt and cheeses. ? Try not to drink whole milk or cream. ? Try not to eat ice cream, egg yolks, or full-fat  cheeses.  Healthy desserts include angel food cake, ginger snaps, animal crackers, hard candy, popsicles, and low-fat or nonfat frozen yogurt. Try not to eat pastries, cakes, pies, and cookies.  Exercise Follow your exercise program as told by your doctor.  Be more active. Try gardening, walking, and taking the stairs.  Ask your doctor about ways that you can be more active.  Medicine  Take over-the-counter and prescription medicines only as told by your doctor. This information is not intended to replace advice given to you by your health care provider. Make sure you discuss any questions you have with your health care provider. Document Released: 08/21/2008 Document Revised: 12/25/2015 Document Reviewed: 12/05/2015 Elsevier Interactive Patient Education  [employer].

## 2017-10-28 NOTE — Progress Notes (Signed)
Patient ID: [name]                 DOB: [birth year]                    MRN: [id number]     HPI: [name] is a 72 y.o. female patient referred to lipid clinic by DR Martinique. PMH is significant for hypertension, hyperlipidemia,VA, and CAD s/p NSTEMI. Noted history of statin intolerance. She tried atorvastatin 80mg  daily for over 1 year and experienced severe muscle pain and problems with ambulation. Rosuvastatin was initiated only few months ago but patient reports similar muscle symptoms. Patient denies previous diagnoses of arthritis, neuropathies or fibromyalgia.  During most recent OV with DR Martinique she was instructed to start ezetimibe 10mg  daily but her co-pay for 90 day supply is > $300 ans she is unable to afford it. Patient presents to discuss affordable options for her cholesterol management.  Current Medications: none  Intolerances:  Atorvastatin 80mg  daily (Sept/2017 to March/2019) Rosuvastatin 20mg  daily (March/2019 - May/2019) Ezetimibe 10mg  daily - unable to affor and  LDL goal: 70mg /dL  Diet: not especial diet, low red meat, some fruits and vegetables  Exercise: minimal due to severe muscle pain  Family History: The patient's family history includes CAD in her daughter; Esophageal cancer in her sister; Lung cancer in her brother.   Labs:  07/23/2016: CHO 109; TG 58; HDL 44; LDL-c 53 (atorvastatin 80mg )  Past Medical History:  Diagnosis Date  . CVA (cerebral infarction) 1996  . Hyperlipidemia   . Hypertension     Current Outpatient Medications on File Prior to Visit  Medication Sig Dispense Refill  . aspirin EC 81 MG tablet Take 81 mg by mouth daily.    . Cholecalciferol (VITAMIN D PO) Take 1 tablet by mouth daily.    Marland Kitchen ezetimibe (ZETIA) 10 MG tablet Take 1 tablet (10 mg total) by mouth daily. 90 tablet 3  . losartan-hydrochlorothiazide (HYZAAR) 100-25 MG per tablet Take 1 tablet by mouth daily.    . Omega-3 Fatty Acids (FISH OIL) 1000 MG CAPS  Take 2,000 mg by mouth daily.    . pantoprazole (PROTONIX) 40 MG tablet Take 1 tablet (40 mg total) by mouth daily. 90 tablet 3   No current facility-administered medications on file prior to visit.     Allergies  Allergen Reactions  . Guaifenesin Er Palpitations  . Lotensin [Benazepril Hcl] Other (See Comments)    Took off when patient had stroke Took off when patient had stroke  . Propoxyphene Palpitations    HLD (hyperlipidemia) Patient is unable to tolerate high intensity statins like atorvastatin and rosuvastatin. She is here to discuss alternative therapy after inability to pay for ezetimibe 10mg  daily. We discussed multiple options like low dose rosuvastatin 10mg  twice per week, and  PCSK9i (with potential for patient assistance program).  She is completely against initiating any injectable product at this time, and somehow reluctant to resume satins due to severe muscle pain. I was able to offer a GoodRx coupon to get ezetimibe 10mg  for $38 instead of >$300. We also discussed appropriate lifestyle modifications, and significance of decreasing her LDL to 70mg /dL. Plan to repeat fasting lipid profile  6-8 weeks after initiating ezetimibe to adjust medication as needed.     Briannon Boggio Rodriguez-Guzman PharmD, BCPS, [facility] [address] [place] [address] 10/28/2017 4:44 PM

## 2017-10-28 NOTE — Assessment & Plan Note (Signed)
Patient is unable to tolerate high intensity statins like atorvastatin and rosuvastatin. She is here to discuss alternative therapy after inability to pay for ezetimibe 10mg  daily. We discussed multiple options like low dose rosuvastatin 10mg  twice per week, and  PCSK9i (with potential for patient assistance program).  She is completely against initiating any injectable product at this time, and somehow reluctant to resume satins due to severe muscle pain. I was able to offer a GoodRx coupon to get ezetimibe 10mg  for $38 instead of >$300. We also discussed appropriate lifestyle modifications, and significance of decreasing her LDL to 70mg /dL. Plan to repeat fasting lipid profile  6-8 weeks after initiating ezetimibe to adjust medication as needed.

## 2018-05-17 NOTE — Progress Notes (Deleted)
Cardiology Office Note    Date:  05/17/2018   ID:  Tiffani, Kadow Aug 08, 1945, MRN [id number]  PCP:  Dr. Anastasia Pall at [facility]  Cardiologist:  Dr. Martinique  No chief complaint on file.   History of Present Illness:  [name] is a 72 y.o. female with PMH of HTN, elevated lipids who was admitted on 02/28/2016 with substernal chest pain, abnormal EKG and positive troponin.  Her troponin was 0.55.  She underwent a cardiac catheterization on 02/26/2016 which showed a 20% proximal to mid RCA lesion, 25% proximal LAD lesion, 30% mid LAD lesion, 95% distal RCA lesion treated with Synergy 3 x 12 mm DES, normal LVEDP. Post procedure, patient was placed on aspirin, Brilinta, she was resumed on her losartan. Toprol-XL was added to her medical regimen. Echocardiogram obtained on 02/29/2016 showed EF 36-14%, grade 1 diastolic dysfunction, mild AR, mildly reduced RV EF.  On last visit we discontinued  Plavix.   On prior visit she noted  marked fatigue related to statin use. Dr. Melford Aase recommended stopping her lipitor and she noted an immediate improvement. She was then placed on Crestor and symptoms recurred. She was prescribed Zetia but she noted it was too expensive. She was seen in our lipid clinic and given a savings card for Zetia $38/month. Other consideration includes low dose statin or PCSK 9 inhibitor but she was opposed to both.    Past Medical History:  Diagnosis Date  . CVA (cerebral infarction) 1996  . Hyperlipidemia   . Hypertension     Past Surgical History:  Procedure Laterality Date  . BACK SURGERY    . CARDIAC CATHETERIZATION N/A 02/28/2016   Procedure: Left Heart Cath and Coronary Angiography;  Surgeon: Peter M Martinique, MD;  Location: [facility];  Service: Cardiovascular;  Laterality: N/A;  . CARDIAC CATHETERIZATION N/A 02/28/2016   Procedure: Coronary Stent Intervention;  Surgeon: Peter M Martinique, MD;  Location: [facility] [facility];  Service:  Cardiovascular;  Laterality: N/A;  . CHOLECYSTECTOMY    . hysterecytomy      Current Medications: Outpatient Medications Prior to Visit  Medication Sig Dispense Refill  . aspirin EC 81 MG tablet Take 81 mg by mouth daily.    . Cholecalciferol (VITAMIN D PO) Take 1 tablet by mouth daily.    Marland Kitchen ezetimibe (ZETIA) 10 MG tablet Take 1 tablet (10 mg total) by mouth daily. 90 tablet 3  . losartan-hydrochlorothiazide (HYZAAR) 100-25 MG per tablet Take 1 tablet by mouth daily.    . Omega-3 Fatty Acids (FISH OIL) 1000 MG CAPS Take 2,000 mg by mouth daily.    . pantoprazole (PROTONIX) 40 MG tablet Take 1 tablet (40 mg total) by mouth daily. 90 tablet 3   No facility-administered medications prior to visit.      Allergies:   Guaifenesin er; Lotensin [benazepril hcl]; and Propoxyphene   Social History   Socioeconomic History  . Marital status: Married    Spouse name: Not on file  . Number of children: Not on file  . Years of education: Not on file  . Highest education level: Not on file  Occupational History  . Not on file  Social Needs  . Financial resource strain: Not on file  . Food insecurity:    Worry: Not on file    Inability: Not on file  . Transportation needs:    Medical: Not on file    Non-medical: Not on file  Tobacco Use  . Smoking  status: Never Smoker  . Smokeless tobacco: Never Used  Substance and Sexual Activity  . Alcohol use: No  . Drug use: No  . Sexual activity: Not on file  Lifestyle  . Physical activity:    Days per week: Not on file    Minutes per session: Not on file  . Stress: Not on file  Relationships  . Social connections:    Talks on phone: Not on file    Gets together: Not on file    Attends religious service: Not on file    Active member of club or organization: Not on file    Attends meetings of clubs or organizations: Not on file    Relationship status: Not on file  Other Topics Concern  . Not on file  Social History Narrative  . Not on  file     Family History:  The patient's family history includes CAD in her daughter; Esophageal cancer in her sister; Lung cancer in her brother.   ROS:   Please see the history of present illness.    ROS All other systems reviewed and are negative.   PHYSICAL EXAM:   VS:  There were no vitals taken for this visit.   GENERAL:  Well appearing, obese WF in NAD HEENT:  PERRL, EOMI, sclera are clear. Oropharynx is clear. NECK:  No jugular venous distention, carotid upstroke brisk and symmetric, no bruits, no thyromegaly or adenopathy LUNGS:  Clear to auscultation bilaterally CHEST:  Unremarkable HEART:  RRR,  PMI not displaced or sustained,S1 and S2 within normal limits, no S3, no S4: no clicks, no rubs, no murmurs ABD:  Soft, nontender. BS +, no masses or bruits. No hepatomegaly, no splenomegaly EXT:  2 + pulses throughout, no edema, no cyanosis no clubbing SKIN:  Warm and dry.  No rashes NEURO:  Alert and oriented x 3. Cranial nerves II through XII intact. PSYCH:  Cognitively intact    Wt Readings from Last 3 Encounters:  10/13/17 198 lb (89.8 kg)  02/11/17 198 lb 6.4 oz (90 kg)  07/10/16 200 lb (90.7 kg)      Studies/Labs Reviewed:   EKG:  EKG is not ordered today.    Recent Labs: No results found for requested labs within last 8760 hours.   Lipid Panel    Component Value Date/Time   CHOL 159 02/29/2016 0556   TRIG 65 02/29/2016 0556   HDL 37 (L) 02/29/2016 0556   CHOLHDL 4.3 02/29/2016 0556   VLDL 13 02/29/2016 0556   LDLCALC 109 (H) 02/29/2016 0556   Labs dated 07/23/16: creatinine 1.25. Otherwise CMET normal. Cholesterol 109, triglycerides 58, HDL 44, LDL 53   Additional studies/ records that were reviewed today include:   Cath 02/28/2016  Conclusion     Prox RCA to Mid RCA lesion, 20 %stenosed.  Prox LAD lesion, 25 %stenosed.  Mid LAD lesion, 30 %stenosed.  Prox Cx to Mid Cx lesion, 15 %stenosed.  A STENT SYNERGY DES 3X12 drug eluting stent was  successfully placed.  Dist RCA lesion, 95 %stenosed.  Post intervention, there is a 0% residual stenosis.  LV end diastolic pressure is normal.   1. 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 Normal LV EDP 3. Successful stenting of the distal RCA with a DES.  Plan: DAPT for one year. Check LV function with Echo. Anticipate DC tomorrow.     Echo 02/29/2016 LV EF: 55% -   60%  ------------------------------------------------------------------- Indications:      Chest  pain 786.51.  ------------------------------------------------------------------- History:   PMH:   Non-ST-elevated myocardial infarction.  PMH: Stroke.  Risk factors:  Hypertension. Dyslipidemia.  ------------------------------------------------------------------- Study Conclusions  - Left ventricle: The cavity size was normal. Systolic function was   normal. The estimated ejection fraction was in the range of 55%   to 60%. Wall motion was normal; there were no regional wall   motion abnormalities. There was an increased relative   contribution of atrial contraction to ventricular filling.   Doppler parameters are consistent with abnormal left ventricular   relaxation (grade 1 diastolic dysfunction). - Aortic valve: There was mild regurgitation. - Right ventricle: Systolic function was mildly reduced.   ASSESSMENT:    No diagnosis found.   PLAN:  In order of problems listed above:  1. CAD with s/p  NSTEMI in September 2017: s/p DES of the distal RCA.  Continue ASA 81 mg daily.   2. HTN: Blood pressure well-controlled. Continue losartan HCT  3. HLD: intolerance to lipitor and  Crestor. Recommend she stop Crestor now. On  Zetia 10 mg daily. Needs follow up lipid panel.   4. Atypical chest pain. Only one episode quite different than prior cardiac pain.  Will monitor for recurrent symptoms.    Medication Adjustments/Labs and Tests Ordered: Current medicines are reviewed at length with the  patient today.  Concerns regarding medicines are outlined above.  Medication changes, Labs and Tests ordered today are listed in the Patient Instructions below. There are no Patient Instructions on file for this visit.   Signed, Peter Martinique, MD ,[facility] 05/17/2018 9:39 AM    [address], [place], [place]  [address] Phone: [phone]; Fax: [phone]

## 2018-05-18 ENCOUNTER — Ambulatory Visit: Payer: Medicare Other | Admitting: Cardiology

## 2018-07-11 ENCOUNTER — Other Ambulatory Visit: Payer: Self-pay | Admitting: Family Medicine

## 2018-07-11 DIAGNOSIS — Z1231 Encounter for screening mammogram for malignant neoplasm of breast: Secondary | ICD-10-CM

## 2018-08-31 ENCOUNTER — Telehealth: Payer: Self-pay

## 2018-08-31 NOTE — Telephone Encounter (Signed)
   Primary Cardiologist:  Dr.Jordan   Patient contacted.  History reviewed.  No symptoms to suggest any unstable cardiac conditions.  Based on discussion, with current pandemic situation, we will be postponing this 09/07/18 appointment for Floydene Flock with a plan for f/u 11/25/18 at 3:20 pm. If symptoms change, she has been instructed to contact our office.     Kathyrn Lass, LPN  7/48/2707 8:67 PM         .

## 2018-09-07 ENCOUNTER — Ambulatory Visit: Payer: Medicare Other | Admitting: Cardiology

## 2018-09-28 ENCOUNTER — Ambulatory Visit: Payer: Medicare Other

## 2018-11-18 ENCOUNTER — Ambulatory Visit
Admission: RE | Admit: 2018-11-18 | Discharge: 2018-11-18 | Disposition: A | Discharge status: Home / Self Care | Payer: Medicare Other | Source: Ambulatory Visit | Attending: Family Medicine | Admitting: Family Medicine

## 2018-11-18 ENCOUNTER — Other Ambulatory Visit: Payer: Self-pay

## 2018-11-18 DIAGNOSIS — Z1231 Encounter for screening mammogram for malignant neoplasm of breast: Secondary | ICD-10-CM

## 2018-11-24 HISTORY — PX: UPPER GI ENDOSCOPY: SHX6162

## 2018-11-24 HISTORY — PX: COLONOSCOPY: SHX174

## 2018-11-25 ENCOUNTER — Ambulatory Visit: Payer: Medicare Other | Admitting: Cardiology

## 2018-12-16 ENCOUNTER — Telehealth: Payer: Self-pay | Admitting: Cardiology

## 2018-12-16 NOTE — Telephone Encounter (Signed)
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 She mentioned that the Surgeon's office will also be contacting us, but she wanted to make Dr. Martinique aware that the patient will need the procedure.      [facility] Pre-operative Risk Assessment    Request for surgical clearance:  1. What type of surgery is being performed? Removal of colon and lymph nodes  2. When is this surgery scheduled? TBD  3. What type of clearance is required (medical clearance vs. Pharmacy clearance to hold med vs. Both)? Both  4. Are there any medications that need to be held prior to surgery and how long?not to the patient's knowledge  5. Practice name and name of physician performing surgery? Dr. Caryn Bee, [facility]  6. What is your office phone number: [phone]   7.   What is your office fax number [phone]  8.   Anesthesia type (None, local, MAC, general) ? General   Johnna Acosta 12/16/2018, 11:04 AM  _________________________________________________________________   (provider comments below)

## 2018-12-19 ENCOUNTER — Telehealth: Payer: Self-pay | Admitting: Cardiology

## 2018-12-19 NOTE — Telephone Encounter (Signed)
I called pt to confirm her appt for 12-20-18 with Kerin Ransom.        COVID-19 Pre-Screening Questions:   In the past 7 to 10 days have you had a cough,  shortness of breath, headache, congestion, fever (100 or greater) body aches, chills, sore throat, or sudden loss of taste or sense of smell? no  Have you been around anyone with known Covid 19.  Have you been around anyone who is awaiting Covid 19 test results in the past 7 to 10 days? no  Have you been around anyone who has been exposed to Covid 19, or has mentioned symptoms of Covid 19 within the past 7 to 10 days? mo  If you have any concerns/questions about symptoms patients report during screening (either on the phone or at threshold). Contact the provider seeing the patient or DOD for further guidance.  If neither are available contact a member of the leadership team.

## 2018-12-19 NOTE — Telephone Encounter (Signed)
   Primary Cardiologist:Peter Martinique, MD  Chart reviewed as part of pre-operative protocol coverage. Because of [name]'s past medical history and time since last visit, he/she will require a follow-up visit in order to better assess preoperative cardiovascular risk.  Pre-op covering staff: - Please schedule appointment and call patient to inform them. - Please contact requesting surgeon's office via preferred method (i.e, phone, fax) to inform them of need for appointment prior to surgery.  If applicable, this message will also be routed to pharmacy pool and/or primary cardiologist for input on holding anticoagulant/antiplatelet agent as requested below so that this information is available at time of patient's appointment.   Cecilie Kicks, NP  12/19/2018, 12:01 PM

## 2018-12-19 NOTE — Telephone Encounter (Signed)
Call placed to pt re: surgical clearance and the need for an appt. She has been scheduled for 12/20/2018 with Kerin Ransom, PA-C. Pt has answered not to the following covid19 prescreen questions.         COVID-19 Pre-Screening Questions:  . In the past 7 to 10 days have you had a cough,  shortness of breath, headache, congestion, fever (100 or greater) body aches, chills, sore throat, or sudden loss of taste or sense of smell? . Have you been around anyone with known Covid 19. . Have you been around anyone who is awaiting Covid 19 test results in the past 7 to 10 days? . Have you been around anyone who has been exposed to Covid 19, or has mentioned symptoms of Covid 19 within the past 7 to 10 days?  If you have any concerns/questions about symptoms patients report during screening (either on the phone or at threshold). Contact the provider seeing the patient or DOD for further guidance.  If neither are available contact a member of the leadership team.

## 2018-12-20 ENCOUNTER — Encounter: Payer: Self-pay | Admitting: Cardiology

## 2018-12-20 ENCOUNTER — Other Ambulatory Visit: Payer: Self-pay

## 2018-12-20 ENCOUNTER — Ambulatory Visit (INDEPENDENT_AMBULATORY_CARE_PROVIDER_SITE_OTHER): Payer: Medicare Other | Admitting: Cardiology

## 2018-12-20 DIAGNOSIS — R0789 Other chest pain: Secondary | ICD-10-CM

## 2018-12-20 DIAGNOSIS — Z9861 Coronary angioplasty status: Secondary | ICD-10-CM

## 2018-12-20 DIAGNOSIS — I214 Non-ST elevation (NSTEMI) myocardial infarction: Secondary | ICD-10-CM | POA: Diagnosis not present

## 2018-12-20 DIAGNOSIS — Z01810 Encounter for preprocedural cardiovascular examination: Secondary | ICD-10-CM

## 2018-12-20 DIAGNOSIS — I10 Essential (primary) hypertension: Secondary | ICD-10-CM

## 2018-12-20 DIAGNOSIS — E785 Hyperlipidemia, unspecified: Secondary | ICD-10-CM | POA: Diagnosis not present

## 2018-12-20 DIAGNOSIS — I2510 Atherosclerotic heart disease of native coronary artery without angina pectoris: Secondary | ICD-10-CM

## 2018-12-20 NOTE — Telephone Encounter (Signed)
   Primary Cardiologist: Peter Martinique, MD  Chart reviewed and patient seen and examined in the office today as part of pre-operative protocol coverage. Given past medical history and time since last visit, based on ACC/AHA guidelines, Dalisa Murchis [place] would be at acceptable risk for the planned procedure without further cardiovascular testing.   I will route this recommendation to the requesting party via Epic fax function and remove from pre-op pool.  Please call with questions.  Kerin Ransom, PA-C 12/20/2018, 3:44 PM

## 2018-12-20 NOTE — Assessment & Plan Note (Signed)
Statin intolerant, could not afford PCSK9

## 2018-12-20 NOTE — Patient Instructions (Signed)
Medication Instructions:  Your physician recommends that you continue on your current medications as directed. Please refer to the Current Medication list given to you today. If you need a refill on your cardiac medications before your next appointment, please call your pharmacy.   Lab work: None  If you have labs (blood work) drawn today and your tests are completely normal, you will receive your results only by: Marland Kitchen MyChart Message (if you have MyChart) OR . A paper copy in the mail If you have any lab test that is abnormal or we need to change your treatment, we will call you to review the results.  Testing/Procedures: None   Follow-Up: At [facility], you and your health needs are our priority.  As part of our continuing mission to provide you with exceptional heart care, we have created designated Provider Care Teams.  These Care Teams include your primary Cardiologist (physician) and Advanced Practice Providers (APPs -  Physician Assistants and Nurse Practitioners) who all work together to provide you with the care you need, when you need it. You will need a follow up appointment in 12 months.  Please call our office 2 months in advance to schedule this appointment.  You may see Peter Martinique, MD or one of the following Advanced Practice Providers on your designated Care Team: [place], [place] . Fabian Sharp, PA-C  Any Other Special Instructions Will Be Listed Below (If Applicable).

## 2018-12-20 NOTE — Assessment & Plan Note (Signed)
S/P dRCA PCI with DES Sept 2017. No problems with chest pain since

## 2018-12-20 NOTE — Assessment & Plan Note (Signed)
Pt had a cancerous polyp on recent examination and is here for pre cardiac clearance.

## 2018-12-20 NOTE — Progress Notes (Signed)
Cardiology Office Note:    Date:  12/20/2018   ID:  Desmond Dike Murchis Laconda, Basich August 20, 1945, MRN [id number]  PCP:  Chesley Noon, MD  Cardiologist:  Peter Martinique, MD  Electrophysiologist:  None   Referring MD: Chesley Noon, MD   No chief complaint on file. Pre op clearance  History of Present Illness:    [name] is a 73 y.o. female with a hx of CAD, status post distal RCA PCI with DES in September 2017.  She done well from a cardiac standpoint since.  Other problems include treated hypertension, obesity, and dyslipidemia.  She had problems with statin therapy and had to be taken off this.  She was evaluated by our pharmacist for alternative therapies but could not afford a PCSK9.  She is on Zetia.  She is in the office today for preoperative clearance.  A recent colonoscopy apparently suggested a cancerous polyp.  From a cardiac standpoint she continues to do well, she denies any unusual chest pain or shortness of breath.  She is able to get around her house in her neighborhood without problems, she can go up and down the stairs without unusual chest pain or shortness of breath.  Past Medical History:  Diagnosis Date  . CVA (cerebral infarction) 1996  . Hyperlipidemia   . Hypertension     Past Surgical History:  Procedure Laterality Date  . BACK SURGERY    . CARDIAC CATHETERIZATION N/A 02/28/2016   Procedure: Left Heart Cath and Coronary Angiography;  Surgeon: Peter M Martinique, MD;  Location: [facility] [facility];  Service: Cardiovascular;  Laterality: N/A;  . CARDIAC CATHETERIZATION N/A 02/28/2016   Procedure: Coronary Stent Intervention;  Surgeon: Peter M Martinique, MD;  Location: [facility];  Service: Cardiovascular;  Laterality: N/A;  . CHOLECYSTECTOMY    . hysterecytomy      Current Medications: Current Meds  Medication Sig  . ezetimibe (ZETIA) 10 MG tablet Take 1 tablet (10 mg total) by mouth daily.  Marland Kitchen losartan-hydrochlorothiazide (HYZAAR)  100-25 MG per tablet Take 1 tablet by mouth daily.  . pantoprazole (PROTONIX) 40 MG tablet Take 1 tablet (40 mg total) by mouth daily.     Allergies:   Guaifenesin er, Lotensin [benazepril hcl], and Propoxyphene   Social History   Socioeconomic History  . Marital status: Married    Spouse name: Not on file  . Number of children: Not on file  . Years of education: Not on file  . Highest education level: Not on file  Occupational History  . Not on file  Social Needs  . Financial resource strain: Not on file  . Food insecurity    Worry: Not on file    Inability: Not on file  . Transportation needs    Medical: Not on file    Non-medical: Not on file  Tobacco Use  . Smoking status: Never Smoker  . Smokeless tobacco: Never Used  Substance and Sexual Activity  . Alcohol use: No  . Drug use: No  . Sexual activity: Not on file  Lifestyle  . Physical activity    Days per week: Not on file    Minutes per session: Not on file  . Stress: Not on file  Relationships  . Social Herbalist on phone: Not on file    Gets together: Not on file    Attends religious service: Not on file    Active member of club or organization: Not on file  Attends meetings of clubs or organizations: Not on file    Relationship status: Not on file  Other Topics Concern  . Not on file  Social History Narrative  . Not on file     Family History: The patient's family history includes CAD in her daughter; Esophageal cancer in her sister; Lung cancer in her brother.  ROS:   Please see the history of present illness.     All other systems reviewed and are negative.  EKGs/Labs/Other Studies Reviewed:    The following studies were reviewed today: Cath/ PCI Sept 2017  EKG:  EKG is ordered today.  The ekg ordered today demonstrates NSR, poor anterior RW  Recent Labs: No results found for requested labs within last 8760 hours.  Recent Lipid Panel    Component Value Date/Time   CHOL 159  02/29/2016 0556   TRIG 65 02/29/2016 0556   HDL 37 (L) 02/29/2016 0556   CHOLHDL 4.3 02/29/2016 0556   VLDL 13 02/29/2016 0556   LDLCALC 109 (H) 02/29/2016 0556    Physical Exam:    VS:  BP (!) 142/86   Pulse 83   Temp (!) 97 F (36.1 C)   Ht 5\' 1"  (1.549 m)   Wt 196 lb (88.9 kg)   SpO2 97%   BMI 37.03 kg/m     Wt Readings from Last 3 Encounters:  12/20/18 196 lb (88.9 kg)  10/13/17 198 lb (89.8 kg)  02/11/17 198 lb 6.4 oz (90 kg)     GEN: obese Caucasian female, well developed in no acute distress HEENT: Normal NECK: No JVD; No carotid bruits LYMPHATICS: No lymphadenopathy CARDIAC: RRR, no murmurs, rubs, gallops RESPIRATORY:  Clear to auscultation without rales, wheezing or rhonchi  ABDOMEN: Soft, non-tender, non-distended MUSCULOSKELETAL:  No edema; No deformity  SKIN: Warm and dry NEUROLOGIC:  Alert and oriented x 3 PSYCHIATRIC:  Normal affect   ASSESSMENT:    Pre-operative cardiovascular examination Pt had a cancerous polyp on recent examination and is here for pre cardiac clearance.  CAD S/P percutaneous coronary angioplasty S/P dRCA PCI with DES Sept 2017. No problems with chest pain since  Essential hypertension Fair control  Dyslipidemia, goal LDL below 70 Statin intolerant, could not afford PCSK9  PLAN:    Pt is an acceptable risk for surgery from a cardiac standpoint.  OK to hold aspirin (she has actually held it since her colonoscopy).  Cardiology with be available as needed.  I will forward formal clearance vis Epic.    Medication Adjustments/Labs and Tests Ordered: Current medicines are reviewed at length with the patient today.  Concerns regarding medicines are outlined above.  No orders of the defined types were placed in this encounter.  No orders of the defined types were placed in this encounter.   There are no Patient Instructions on file for this visit.   Angelena Form, PA-C  12/20/2018 3:38 PM    [facility]

## 2018-12-20 NOTE — Assessment & Plan Note (Signed)
Fair control.

## 2018-12-21 ENCOUNTER — Other Ambulatory Visit (INDEPENDENT_AMBULATORY_CARE_PROVIDER_SITE_OTHER): Payer: Medicare Other

## 2018-12-21 DIAGNOSIS — I2510 Atherosclerotic heart disease of native coronary artery without angina pectoris: Secondary | ICD-10-CM

## 2018-12-21 DIAGNOSIS — R0789 Other chest pain: Secondary | ICD-10-CM

## 2018-12-21 DIAGNOSIS — Z01810 Encounter for preprocedural cardiovascular examination: Secondary | ICD-10-CM

## 2018-12-21 DIAGNOSIS — E785 Hyperlipidemia, unspecified: Secondary | ICD-10-CM

## 2018-12-21 DIAGNOSIS — I10 Essential (primary) hypertension: Secondary | ICD-10-CM

## 2018-12-21 DIAGNOSIS — I214 Non-ST elevation (NSTEMI) myocardial infarction: Secondary | ICD-10-CM

## 2018-12-24 ENCOUNTER — Other Ambulatory Visit (HOSPITAL_COMMUNITY): Payer: Self-pay | Admitting: Surgery

## 2019-01-09 ENCOUNTER — Other Ambulatory Visit: Payer: Self-pay | Admitting: Cardiology

## 2019-01-12 ENCOUNTER — Encounter (HOSPITAL_COMMUNITY): Payer: Self-pay

## 2019-01-12 ENCOUNTER — Other Ambulatory Visit (HOSPITAL_COMMUNITY): Payer: Medicare Other

## 2019-01-12 NOTE — Progress Notes (Signed)
The following are in epic: Cardiac clearance and last office visit note L. Kilroy P.A. 12/20/2018 EKG 12/20/2018

## 2019-01-12 NOTE — Patient Instructions (Addendum)
DUE TO COVID-19 ONLY ONE VISITOR IS ALLOWED TO VISIT DURING VISITOR HOURS ONLY!!   COVID SWAB TESTING MUST BE COMPLETED ON: Today immediately after pre op appointment.  [address] [place] -[facility] enter pre surgical testing line (Must self quarantine after testing. Follow instructions on handout.)             Your procedure is scheduled on: Tuesday, Aug. 11, 2020   Report to [facility] Main  Entrance   Report to Short Stay at 5:30 AM   Call this number if you have problems the morning of surgery [phone]   Drink 2 Ensure drinks the night before surgery.     Do not eat food:After Midnight.   May have liquids until 4:30AM day of surgery   CLEAR LIQUID DIET  Foods Allowed                                                                     Foods Excluded  Water, Black Coffee and tea, regular and decaf                             liquids that you cannot  Plain Jell-O in any flavor  (No red)                                           see through such as: Fruit ices (not with fruit pulp)                                     milk, soups, orange juice  Iced Popsicles (No red)                                    All solid food Carbonated beverages, regular and diet                                    Apple juices Sports drinks like Gatorade (No red) Lightly seasoned clear broth or consume(fat free) Sugar, honey syrup  Sample Menu Breakfast                                Lunch                                     Supper Cranberry juice                    Beef broth                            Chicken broth Jell-O  Grape juice                           Apple juice Coffee or tea                        Jell-O                                      Popsicle                                                Coffee or tea                        Coffee or teaCLEAR LIQUID DIET  Foods Allowed                                                                      Foods Excluded  Water, Black Coffee and tea, regular and decaf                             liquids that you cannot  Plain Jell-O in any flavor  (No red)                                           see through such as: Fruit ices (not with fruit pulp)                                     milk, soups, orange juice  Iced Popsicles (No red)                                    All solid food Carbonated beverages, regular and diet                                    Apple juices Sports drinks like Gatorade (No red) Lightly seasoned clear broth or consume(fat free) Sugar, honey syrup  Sample Menu Breakfast                                Lunch                                     Supper Cranberry juice                    Beef broth  Chicken broth Jell-O                                     Grape juice                           Apple juice Coffee or tea                        Jell-O                                      Popsicle                                                Coffee or tea                        Coffee or tea   Complete one Ensure drink the morning of surgery at 4:30AM the day of surgery.   Brush your teeth the morning of surgery.   Do NOT smoke after Midnight   Take these medicines the morning of surgery with A SIP OF WATER: Amlodipine, Omeprazole                               You may not have any metal on your body including hair pins, jewelry, and body piercings             Do not wear make-up, lotions, powders, perfumes/cologne, or deodorant             Do not wear nail polish.  Do not shave  48 hours prior to surgery.               Do not bring valuables to the hospital. [place].   Contacts, dentures or bridgework may not be worn into surgery.   Bring small overnight bag day of surgery.    Special Instructions: Bring a copy of your healthcare power of attorney and  living will documents         the day of surgery if you haven't scanned them in before.              Please read over the following fact sheets you were given:   [facility] - Preparing for Surgery Before surgery, you can play an important role.  Because skin is not sterile, your skin needs to be as free of germs as possible.  You can reduce the number of germs on your skin by washing with CHG (chlorahexidine gluconate) soap before surgery.  CHG is an antiseptic cleaner which kills germs and bonds with the skin to continue killing germs even after washing. Please DO NOT use if you have an allergy to CHG or antibacterial soaps.  If your skin becomes reddened/irritated stop using the CHG and inform your nurse when you arrive at Short Stay. Do not shave (including legs and underarms) for at least 48 hours prior to the first  CHG shower.  You may shave your face/neck.  Please follow these instructions carefully:  1.  Shower with CHG Soap the night before surgery and the  morning of surgery.  2.  If you choose to wash your hair, wash your hair first as usual with your normal  shampoo.  3.  After you shampoo, rinse your hair and body thoroughly to remove the shampoo.                             4.  Use CHG as you would any other liquid soap.  You can apply chg directly to the skin and wash.  Gently with a scrungie or clean washcloth.  5.  Apply the CHG Soap to your body ONLY FROM THE NECK DOWN.   Do   not use on face/ open                           Wound or open sores. Avoid contact with eyes, ears mouth and   genitals (private parts).                       Wash face,  Genitals (private parts) with your normal soap.             6.  Wash thoroughly, paying special attention to the area where your    surgery  will be performed.  7.  Thoroughly rinse your body with warm water from the neck down.  8.  DO NOT shower/wash with your normal soap after using and rinsing off the CHG Soap.                9.  Pat  yourself dry with a clean towel.            10.  Wear clean pajamas.            11.  Place clean sheets on your bed the night of your first shower and do not  sleep with pets. Day of Surgery : Do not apply any lotions/deodorants the morning of surgery.  Please wear clean clothes to the hospital/surgery center.  FAILURE TO FOLLOW THESE INSTRUCTIONS MAY RESULT IN THE CANCELLATION OF YOUR SURGERY  PATIENT SIGNATURE_________________________________  NURSE SIGNATURE__________________________________  ________________________________________________________________________   [name]  An incentive spirometer is a tool that can help keep your lungs clear and active. This tool measures how well you are filling your lungs with each breath. Taking long deep breaths may help reverse or decrease the chance of developing breathing (pulmonary) problems (especially infection) following:  A long period of time when you are unable to move or be active. BEFORE THE PROCEDURE   If the spirometer includes an indicator to show your best effort, your nurse or respiratory therapist will set it to a desired goal.  If possible, sit up straight or lean slightly forward. Try not to slouch.  Hold the incentive spirometer in an upright position. INSTRUCTIONS FOR USE  1. Sit on the edge of your bed if possible, or sit up as far as you can in bed or on a chair. 2. Hold the incentive spirometer in an upright position. 3. Breathe out normally. 4. Place the mouthpiece in your mouth and seal your lips tightly around it. 5. Breathe in slowly and as deeply as possible, raising the piston or the ball toward the top of the column.  6. Hold your breath for 3-5 seconds or for as long as possible. Allow the piston or ball to fall to the bottom of the column. 7. Remove the mouthpiece from your mouth and breathe out normally. 8. Rest for a few seconds and repeat Steps 1 through 7 at least 10 times every 1-2 hours  when you are awake. Take your time and take a few normal breaths between deep breaths. 9. The spirometer may include an indicator to show your best effort. Use the indicator as a goal to work toward during each repetition. 10. After each set of 10 deep breaths, practice coughing to be sure your lungs are clear. If you have an incision (the cut made at the time of surgery), support your incision when coughing by placing a pillow or rolled up towels firmly against it. Once you are able to get out of bed, walk around indoors and cough well. You may stop using the incentive spirometer when instructed by your caregiver.  RISKS AND COMPLICATIONS  Take your time so you do not get dizzy or light-headed.  If you are in pain, you may need to take or ask for pain medication before doing incentive spirometry. It is harder to take a deep breath if you are having pain. AFTER USE  Rest and breathe slowly and easily.  It can be helpful to keep track of a log of your progress. Your caregiver can provide you with a simple table to help with this. If you are using the spirometer at home, follow these instructions: [place] IF:   You are having difficultly using the spirometer.  You have trouble using the spirometer as often as instructed.  Your pain medication is not giving enough relief while using the spirometer.  You develop fever of 100.5 F (38.1 C) or higher. SEEK IMMEDIATE MEDICAL CARE IF:   You cough up bloody sputum that had not been present before.  You develop fever of 102 F (38.9 C) or greater.  You develop worsening pain at or near the incision site. MAKE SURE YOU:   Understand these instructions.  Will watch your condition.  Will get help right away if you are not doing well or get worse. Document Released: 10/05/2006 Document Revised: 08/17/2011 Document Reviewed: 12/06/2006 [facility] Patient Information 2014 [place],  [place].   ________________________________________________________________________

## 2019-01-13 ENCOUNTER — Encounter (HOSPITAL_COMMUNITY): Payer: Self-pay

## 2019-01-13 ENCOUNTER — Encounter (HOSPITAL_COMMUNITY)
Admission: RE | Admit: 2019-01-13 | Discharge: 2019-01-13 | Disposition: A | Discharge status: Home / Self Care | Payer: Medicare Other | Source: Ambulatory Visit | Attending: Surgery | Admitting: Surgery

## 2019-01-13 ENCOUNTER — Other Ambulatory Visit (HOSPITAL_COMMUNITY)
Admission: RE | Admit: 2019-01-13 | Discharge: 2019-01-13 | Disposition: A | Discharge status: Home / Self Care | Payer: Medicare Other | Source: Ambulatory Visit | Attending: Surgery | Admitting: Surgery

## 2019-01-13 ENCOUNTER — Other Ambulatory Visit: Payer: Self-pay

## 2019-01-13 DIAGNOSIS — Z20828 Contact with and (suspected) exposure to other viral communicable diseases: Secondary | ICD-10-CM | POA: Diagnosis not present

## 2019-01-13 DIAGNOSIS — C189 Malignant neoplasm of colon, unspecified: Secondary | ICD-10-CM | POA: Insufficient documentation

## 2019-01-13 DIAGNOSIS — Z01812 Encounter for preprocedural laboratory examination: Secondary | ICD-10-CM | POA: Diagnosis present

## 2019-01-13 HISTORY — DX: Pain in right shoulder: M25.511

## 2019-01-13 HISTORY — DX: Nausea with vomiting, unspecified: R11.2

## 2019-01-13 HISTORY — DX: Other specified postprocedural states: Z98.890

## 2019-01-13 HISTORY — DX: Malignant neoplasm of ascending colon: C18.2

## 2019-01-13 HISTORY — DX: Morbid (severe) obesity due to excess calories: E66.01

## 2019-01-13 HISTORY — DX: Atherosclerotic heart disease of native coronary artery without angina pectoris: I25.10

## 2019-01-13 HISTORY — DX: Gastro-esophageal reflux disease without esophagitis: K21.9

## 2019-01-13 HISTORY — DX: Personal history of other diseases of the circulatory system: Z86.79

## 2019-01-13 HISTORY — DX: Iron deficiency anemia, unspecified: D50.9

## 2019-01-13 HISTORY — DX: Dyspnea, unspecified: R06.00

## 2019-01-13 LAB — COMPREHENSIVE METABOLIC PANEL
ALT: 12 U/L (ref 0–44)
AST: 17 U/L (ref 15–41)
Albumin: 3.9 g/dL (ref 3.5–5.0)
Alkaline Phosphatase: 87 U/L (ref 38–126)
Anion gap: 12 (ref 5–15)
BUN: 15 mg/dL (ref 8–23)
CO2: 26 mmol/L (ref 22–32)
Calcium: 9.4 mg/dL (ref 8.9–10.3)
Chloride: 98 mmol/L (ref 98–111)
Creatinine, Ser: 1.32 mg/dL — ABNORMAL HIGH (ref 0.44–1.00)
GFR calc Af Amer: 46 mL/min — ABNORMAL LOW (ref >60)
GFR calc non Af Amer: 40 mL/min — ABNORMAL LOW (ref >60)
Glucose, Bld: 112 mg/dL — ABNORMAL HIGH (ref 70–99)
Potassium: 4 mmol/L (ref 3.5–5.1)
Sodium: 136 mmol/L (ref 135–145)
Total Bilirubin: 0.5 mg/dL (ref 0.3–1.2)
Total Protein: 8.2 g/dL — ABNORMAL HIGH (ref 6.5–8.1)

## 2019-01-13 LAB — CBC WITH DIFFERENTIAL/PLATELET
Abs Immature Granulocytes: 0.05 10*3/uL (ref 0.00–0.07)
Basophils Absolute: 0.1 10*3/uL (ref 0.0–0.1)
Basophils Relative: 1 %
Eosinophils Absolute: 0.1 10*3/uL (ref 0.0–0.5)
Eosinophils Relative: 1 %
HCT: 38.7 % (ref 36.0–46.0)
Hemoglobin: 11.9 g/dL — ABNORMAL LOW (ref 12.0–15.0)
Immature Granulocytes: 1 %
Lymphocytes Relative: 27 %
Lymphs Abs: 2.9 10*3/uL (ref 0.7–4.0)
MCH: 26.2 pg (ref 26.0–34.0)
MCHC: 30.7 g/dL (ref 30.0–36.0)
MCV: 85.2 fL (ref 80.0–100.0)
Monocytes Absolute: 0.6 10*3/uL (ref 0.1–1.0)
Monocytes Relative: 6 %
Neutro Abs: 6.9 10*3/uL (ref 1.7–7.7)
Neutrophils Relative %: 64 %
Platelets: 361 10*3/uL (ref 150–400)
RBC: 4.54 MIL/uL (ref 3.87–5.11)
RDW: 21.2 % — ABNORMAL HIGH (ref 11.5–15.5)
WBC: 10.6 10*3/uL — ABNORMAL HIGH (ref 4.0–10.5)
nRBC: 0 % (ref 0.0–0.2)

## 2019-01-13 LAB — HEMOGLOBIN A1C
Hgb A1c MFr Bld: 5.5 % (ref 4.8–5.6)
Mean Plasma Glucose: 111.15 mg/dL

## 2019-01-13 NOTE — Progress Notes (Signed)
SPOKE W/  Inayah     SCREENING SYMPTOMS OF COVID 19:   COUGH--No  RUNNY NOSE--- No  SORE THROAT---No  NASAL CONGESTION----No  SNEEZING----No  SHORTNESS OF BREATH---No  DIFFICULTY BREATHING---No  TEMP >100.0 -----No  UNEXPLAINED BODY ACHES------No  CHILLS -------- No  HEADACHES ---------No  LOSS OF SMELL/ TASTE --------No    HAVE YOU OR ANY FAMILY MEMBER TRAVELLED PAST 14 DAYS OUT OF THE   COUNTY---No STATE----No COUNTRY----No  HAVE YOU OR ANY FAMILY MEMBER BEEN EXPOSED TO ANYONE WITH COVID 19? No

## 2019-01-14 LAB — CEA: CEA: 12.7 ng/mL — ABNORMAL HIGH (ref 0.0–4.7)

## 2019-01-14 LAB — SARS CORONAVIRUS 2 (TAT 6-24 HRS): SARS Coronavirus 2: NEGATIVE

## 2019-01-16 MED ORDER — BUPIVACAINE LIPOSOME 1.3 % IJ SUSP
20.0000 mL | Freq: Once | INTRAMUSCULAR | Status: DC
  Filled 2019-01-16: qty 20

## 2019-01-16 NOTE — Anesthesia Preprocedure Evaluation (Addendum)
Anesthesia Evaluation  Patient identified by MRN, date of birth, ID band Patient awake    Reviewed: Allergy & Precautions, NPO status , Patient's Chart, lab work & pertinent test results  History of Anesthesia Complications (+) PONV  Airway Mallampati: II  TM Distance: >3 FB Neck ROM: Full    Dental no notable dental hx.    Pulmonary neg pulmonary ROS,    Pulmonary exam normal breath sounds clear to auscultation       Cardiovascular hypertension, + CAD and + Past MI  Normal cardiovascular exam Rhythm:Regular Rate:Normal     Neuro/Psych CVA negative psych ROS   GI/Hepatic negative GI ROS, Neg liver ROS,   Endo/Other  negative endocrine ROS  Renal/GU negative Renal ROS  negative genitourinary   Musculoskeletal negative musculoskeletal ROS (+)   Abdominal   Peds negative pediatric ROS (+)  Hematology negative hematology ROS (+)   Anesthesia Other Findings   Reproductive/Obstetrics negative OB ROS                            Anesthesia Physical Anesthesia Plan  ASA: III  Anesthesia Plan: General   Post-op Pain Management:    Induction: Intravenous  PONV Risk Score and Plan: 4 or greater and Ondansetron, Dexamethasone, Treatment may vary due to age or medical condition and Scopolamine patch - Pre-op  Airway Management Planned: Oral ETT  Additional Equipment:   Intra-op Plan:   Post-operative Plan: Extubation in OR  Informed Consent: I have reviewed the patients History and Physical, chart, labs and discussed the procedure including the risks, benefits and alternatives for the proposed anesthesia with the patient or authorized representative who has indicated his/her understanding and acceptance.     Dental advisory given  Plan Discussed with: CRNA and Surgeon  Anesthesia Plan Comments: (See PAT note 01/13/2019, Konrad Felix, PA-C)       Anesthesia Quick  Evaluation

## 2019-01-16 NOTE — H&P (Signed)
[name]  Location: [facility] Surgery Patient #: [id number] DOB: September 14, 1945 Married / Language: English / Race: White Female  History of Present Illness   The patient is a 73 year old female who presents with a complaint of colon tumor.  The PCP is D.r Anastasia Pall  The patient was referred by Dr. Lenna Sciara. Earlean Shawl.   The patient was seen by Dr. Earlean Shawl for evaluation of a recently found anemia. She denied any GI bleeding. She said that she tends to stay constipated. She did report some dizziness. Her last colonoscopy was 2016 by Dr. Robynn Pane.  She underwent a colonoscopy by Dr. 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 The biopsy results showed invasive adenoca, moderately differentiated. Dr. Nicky Pugh. Copy given to patient. She has had a CT scan of her abdomen on 12/07/2018 - which shows some cecal thickening and possible adenopathy. There is no evidence of metastatic disease. The CT scan can be seen through the backdoor of Epic.  I reviewed with the patient the findings and need for colon surgery. I discussed both surgical and non surgical options. I discussed the role of laparoscopic and open surgery in colon surgery. I reviewed the risks of surgery, including, but not limited to, infection, bleeding, nerve injury, anastomotic leaks, and possibility of colostomy. I went over the preoperative mechanical and antibiotic bowel prep for colon surgery and provided prescriptions for these. I provided the patient literature on colon surgery. I tried to answer all questions for the patient.  Plan: 1) Cardiac clearance with Dr. Martinique, 2) Mechanical and antibiotic bowel prep, 3) Laparoscopic assisted right colectomy  Review of Systems as stated in this history (HPI) or in the review of systems. Otherwise all other 12 point ROS are negative  Past Medical History: 1. Back surgery - 1983/1984 -  [place] 2. Cholecystectomy - 2006 - [place] 3. Hysterectomy - [place] 4. CAD s/p NSTEMI cardiac catheterization - 02/26/2016 She has been followed by Dr. Martinique 5. HTN 6. Hyperlipidemia 7. CVA - 1996 Some minimal left leg weakness 8. Obese - BMI - 37  Social History: Married. Husband Rosanna Randy. Has one daughter, Duwaine Maxin, who lives with her. And Candice has one daughter.   Past Surgical History Nance Pew, CMA; 12/16/2018 8:51 AM) Breast Biopsy  Left. Gallbladder Surgery - Laparoscopic  Gallbladder Surgery - Open  Hysterectomy (due to cancer) - Partial  Spinal Surgery - Lower Back  Valve Replacement   Diagnostic Studies History Nance Pew, CMA; 12/16/2018 8:51 AM) Colonoscopy  within last year Mammogram  within last year  Allergies Nance Pew, CMA; 12/16/2018 8:52 AM) No Known Allergies  [12/16/2018]: No Known Drug Allergies  [12/16/2018]: Allergies Reconciled   Medication History Nance Pew, CMA; 12/16/2018 8:53 AM) Ezetimibe (10MG  Tablet, Oral) Active. amLODIPine Besylate (2.5MG  Tablet, Oral) Active. Losartan Potassium (25MG  Tablet, Oral) Active. hydroCHLOROthiazide (25MG  Tablet, Oral) Active. Medications Reconciled  Social History Nance Pew, CMA; 12/16/2018 8:51 AM) Caffeine use  Carbonated beverages, Coffee. No alcohol use  No drug use  Tobacco use  Never smoker.  Family History Nance Pew, [place]; 12/16/2018 8:51 AM) Arthritis  Father. Colon Cancer  Sister. Colon Polyps  Sister. Depression  Daughter. Diabetes Mellitus  Daughter, Mother. Heart Disease  Daughter. Hypertension  Daughter, Mother, Sister. Respiratory Condition  Brother.  Pregnancy / Birth History Nance Pew, [place]; 12/16/2018 8:51 AM) Age at menarche  43 years. Contraceptive History  Oral contraceptives. Gravida  1 Maternal age  21-25 Para  1 Regular periods   Other Problems Nance Pew, CMA; 12/16/2018  8:51 AM) Back Pain  Cerebrovascular Accident  Congestive Heart Failure  General anesthesia - complications  Hemorrhoids  High blood pressure  Hypercholesterolemia  Myocardial infarction     Review of Systems ([place]; 12/16/2018 8:51 AM) General Present- Fatigue. Not Present- Appetite Loss, Chills, Fever, Night Sweats, Weight Gain and Weight Loss. Skin Not Present- Change in Wart/Mole, Dryness, Hives, Jaundice, New Lesions, Non-Healing Wounds, Rash and Ulcer. HEENT Not Present- Earache, Hearing Loss, Hoarseness, Nose Bleed, Oral Ulcers, Ringing in the Ears, Seasonal Allergies, Sinus Pain, Sore Throat, Visual Disturbances, Wears glasses/contact lenses and Yellow Eyes. Respiratory Present- Snoring. Not Present- Bloody sputum, Chronic Cough, Difficulty Breathing and Wheezing. Breast Not Present- Breast Mass, Breast Pain, Nipple Discharge and Skin Changes. Cardiovascular Present- Leg Cramps. Not Present- Chest Pain, Difficulty Breathing Lying Down, Palpitations, Rapid Heart Rate, Shortness of Breath and Swelling of Extremities. Gastrointestinal Present- Constipation, Hemorrhoids and Indigestion. Not Present- Abdominal Pain, Bloating, Bloody Stool, Change in Bowel Habits, Chronic diarrhea, Difficulty Swallowing, Excessive gas, Gets full quickly at meals, Nausea, Rectal Pain and Vomiting. Female Genitourinary Not Present- Frequency, Nocturia, Painful Urination, Pelvic Pain and Urgency. Musculoskeletal Present- Muscle Pain. Not Present- Back Pain, Joint Pain, Joint Stiffness, Muscle Weakness and Swelling of Extremities. Neurological Not Present- Decreased Memory, Fainting, Headaches, Numbness, Seizures, Tingling, Tremor, Trouble walking and Weakness. Psychiatric Not Present- Anxiety, Bipolar, Change in Sleep Pattern, Depression, Fearful and Frequent crying. Endocrine Not Present- Cold Intolerance, Excessive Hunger, Hair Changes, Heat Intolerance, Hot flashes and New  Diabetes. Hematology Not Present- Blood Thinners, Easy Bruising, Excessive bleeding, Gland problems, HIV and Persistent Infections.  Vitals (Sabrina Canty CMA; 12/16/2018 8:54 AM) 12/16/2018 8:53 AM Weight: 197 lb Height: 61in Body Surface Area: 1.88 m Body Mass Index: 37.22 kg/m  Temp.: 98.64F (Oral)  Pulse: 107 (Regular)  BP: 138/74(Sitting, Left Arm, Standard)   Physical Exam  General: Moderately obese WF who is alert and generally healthy appearing. She is wearing a mask. HEENT: Normal. Pupils equal.  Neck: Supple. No mass. No thyroid mass. Lymph Nodes: No supraclavicular or cervical nodes.  Lungs: Clear to auscultation and symmetric breath sounds. Heart: RRR. No murmur or rub.  Abdomen: Soft. No mass. No tenderness. No hernia. Normal bowel sounds.  She has a lower abdominal midline scar - from her hysterectomy.  She is about 1/2 pear and 1/2 apple. Rectal: Not done.  Extremities: Good strength and ROM in upper and lower extremities.  Neurologic: Grossly intact to motor and sensory function. Psychiatric: Has normal mood and affect. Behavior is normal.  Assessment & Plan  1.  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 Martinique   She saw Kerin Ransom on 12/20/2018  2) Mechanical and antibiotic bowel prep   3) Laparoscopic assisted right colectomy  Addendum Note(Travontae Freiberger H. Luvada Salamone MD; 01/14/2019 1:05 PM) Her CEA is 12.7  2. CAD s/p NSTEMI  Cardiac catheterization - 02/26/2016 - with DES  She has been followed by Dr. Martinique 3. HTN 4. Hyperlipidemia 5. CVA - 1996 Some minimal left leg weakness 6. Obese - BMI - 37   Alphonsa Overall, MD, [facility] Surgery Pager: [phone] Office phone:  [phone]

## 2019-01-16 NOTE — Progress Notes (Signed)
Anesthesia Chart Review   Case: [id number] Date/Time: 01/17/19 0715   Procedure: LAPAROSCOPIC RIGHT HEMI COLECTOMY (Right )   Anesthesia type: General   Pre-op diagnosis: cecal colon Cancer   Location: [place] [facility] / [facility]   Surgeon: Alphonsa Overall, MD      DISCUSSION:73 y.o. never smoker with h/o PONV, CVA 1996, HTN, HLD, GERD, anemia, CAD (DES distal RCA 2017), cecal colon cancer scheduled for above procedure 01/17/2019 with Dr. Alphonsa Overall.   Pt last seen by cardiology 12/20/2018.  Seen By Kerin Ransom, PA-C.  Per OV note, "Pt is an acceptable risk for surgery from a cardiac standpoint.  OK to hold aspirin (she has actually held it since her colonoscopy).  Cardiology with be available as needed.  I will forward formal clearance vis Epic."  Anticipate pt can proceed with planned procedure barring acute status change.   VS: BP (!) 141/81   Pulse 72   Temp 36.7 C (Oral)   Resp 16   Ht 5\' 1"  (1.549 m)   Wt 89.2 kg   SpO2 100%   BMI 37.15 kg/m   PROVIDERS: Chesley Noon, MD is PCP   Martinique, Peter, MD is Cardiologist  LABS: Labs reviewed: Acceptable for surgery. (all labs ordered are listed, but only abnormal results are displayed)  Labs Reviewed  CBC WITH DIFFERENTIAL/PLATELET - Abnormal; Notable for the following components:      Result Value   WBC 10.6 (*)    Hemoglobin 11.9 (*)    RDW 21.2 (*)    All other components within normal limits  CEA - Abnormal; Notable for the following components:   CEA 12.7 (*)    All other components within normal limits  COMPREHENSIVE METABOLIC PANEL - Abnormal; Notable for the following components:   Glucose, Bld 112 (*)    Creatinine, Ser 1.32 (*)    Total Protein 8.2 (*)    GFR calc non Af Amer 40 (*)    GFR calc Af Amer 46 (*)    All other components within normal limits  HEMOGLOBIN A1C     IMAGES:   EKG: 12/20/2018 Rate 83 bpm Normal sinus rhythm  Anterior infarct, age undetermined  CV: Echo 02/29/16 Study  Conclusions  - Left ventricle: The cavity size was normal. Systolic function was   normal. The estimated ejection fraction was in the range of 55%   to 60%. Wall motion was normal; there were no regional wall   motion abnormalities. There was an increased relative   contribution of atrial contraction to ventricular filling.   Doppler parameters are consistent with abnormal left ventricular   relaxation (grade 1 diastolic dysfunction). - Aortic valve: There was mild regurgitation. - Right ventricle: Systolic function was mildly reduced.  Cardiac Cath 02/28/16  Prox RCA to Mid RCA lesion, 20 %stenosed.  Prox LAD lesion, 25 %stenosed.  Mid LAD lesion, 30 %stenosed.  Prox Cx to Mid Cx lesion, 15 %stenosed.  A STENT SYNERGY DES 3X12 drug eluting stent was successfully placed.  Dist RCA lesion, 95 %stenosed.  Post intervention, there is a 0% residual stenosis.  LV end diastolic pressure is normal.   1. 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 Normal LV EDP 3. Successful stenting of the distal RCA with a DES.  Plan: DAPT for one year. Check LV function with Echo. Anticipate DC tomorrow.  Past Medical History:  Diagnosis Date  . Cancer of right colon ([place])   . Coronary artery disease   .  CVA (cerebral infarction) 1996  . Dyspnea    going up stairs and hills  . GERD (gastroesophageal reflux disease)   . History of cardiomegaly   . Hyperlipidemia   . Hypertension   . Iron deficiency anemia   . Myocardial infarction ([place]) 2017  . PONV (postoperative nausea and vomiting)   . Right shoulder pain    more painful at night , cold makes it worse  . Severe obesity ([place])     Past Surgical History:  Procedure Laterality Date  . ABDOMINAL HYSTERECTOMY    . BACK SURGERY     x2  . CARDIAC CATHETERIZATION N/A 02/28/2016   Procedure: Left Heart Cath and Coronary Angiography;  Surgeon: Peter M Martinique, MD;  Location: [place] [facility];  Service: Cardiovascular;  Laterality:  N/A;  . CARDIAC CATHETERIZATION N/A 02/28/2016   Procedure: Coronary Stent Intervention;  Surgeon: Peter M Martinique, MD;  Location: [place] [facility];  Service: Cardiovascular;  Laterality: N/A;  . CHOLECYSTECTOMY    . COLONOSCOPY  11/24/2018  . CORONARY ANGIOPLASTY    . CYST EXCISION Left    Cheek  . NASAL FRACTURE SURGERY    . UPPER GI ENDOSCOPY  11/24/2018    MEDICATIONS: . amLODipine (NORVASC) 5 MG tablet  . ezetimibe (ZETIA) 10 MG tablet  . Ferrous Sulfate (IRON PO)  . hydrochlorothiazide (HYDRODIURIL) 25 MG tablet  . losartan (COZAAR) 100 MG tablet  . omeprazole (PRILOSEC) 40 MG capsule  . pantoprazole (PROTONIX) 40 MG tablet   No current facility-administered medications for this encounter.    Derrill Memo ON 01/17/2019] bupivacaine liposome (EXPAREL) 1.3 % injection 266 mg    Maia Plan [facility] Pre-Surgical Testing [phone] 01/16/19  12:29 PM

## 2019-01-17 ENCOUNTER — Inpatient Hospital Stay (HOSPITAL_COMMUNITY): Payer: Medicare Other | Admitting: Physician Assistant

## 2019-01-17 ENCOUNTER — Other Ambulatory Visit: Payer: Self-pay

## 2019-01-17 ENCOUNTER — Encounter (HOSPITAL_COMMUNITY): Payer: Self-pay | Admitting: Anesthesiology

## 2019-01-17 ENCOUNTER — Inpatient Hospital Stay (HOSPITAL_COMMUNITY)
Admission: RE | Admit: 2019-01-17 | Discharge: 2019-01-23 | DRG: 330 | Disposition: A | Discharge status: Home / Self Care | Payer: Medicare Other | Attending: Surgery | Admitting: Surgery

## 2019-01-17 ENCOUNTER — Inpatient Hospital Stay (HOSPITAL_COMMUNITY): Payer: Medicare Other | Admitting: Anesthesiology

## 2019-01-17 ENCOUNTER — Encounter (HOSPITAL_COMMUNITY)
Admission: RE | Disposition: A | Discharge status: Home / Self Care | Payer: Self-pay | Source: Home / Self Care | Attending: Surgery

## 2019-01-17 DIAGNOSIS — K660 Peritoneal adhesions (postprocedural) (postinfection): Secondary | ICD-10-CM | POA: Diagnosis present

## 2019-01-17 DIAGNOSIS — Z9889 Other specified postprocedural states: Secondary | ICD-10-CM | POA: Diagnosis present

## 2019-01-17 DIAGNOSIS — E119 Type 2 diabetes mellitus without complications: Secondary | ICD-10-CM | POA: Diagnosis present

## 2019-01-17 DIAGNOSIS — Z8371 Family history of colonic polyps: Secondary | ICD-10-CM

## 2019-01-17 DIAGNOSIS — Z8679 Personal history of other diseases of the circulatory system: Secondary | ICD-10-CM

## 2019-01-17 DIAGNOSIS — R112 Nausea with vomiting, unspecified: Secondary | ICD-10-CM | POA: Diagnosis present

## 2019-01-17 DIAGNOSIS — K567 Ileus, unspecified: Secondary | ICD-10-CM | POA: Diagnosis not present

## 2019-01-17 DIAGNOSIS — E785 Hyperlipidemia, unspecified: Secondary | ICD-10-CM | POA: Diagnosis present

## 2019-01-17 DIAGNOSIS — I10 Essential (primary) hypertension: Secondary | ICD-10-CM | POA: Diagnosis present

## 2019-01-17 DIAGNOSIS — Z8249 Family history of ischemic heart disease and other diseases of the circulatory system: Secondary | ICD-10-CM | POA: Diagnosis not present

## 2019-01-17 DIAGNOSIS — K219 Gastro-esophageal reflux disease without esophagitis: Secondary | ICD-10-CM | POA: Diagnosis present

## 2019-01-17 DIAGNOSIS — Z800 Family history of malignant neoplasm of digestive organs: Secondary | ICD-10-CM

## 2019-01-17 DIAGNOSIS — Z79899 Other long term (current) drug therapy: Secondary | ICD-10-CM

## 2019-01-17 DIAGNOSIS — I2510 Atherosclerotic heart disease of native coronary artery without angina pectoris: Secondary | ICD-10-CM | POA: Diagnosis present

## 2019-01-17 DIAGNOSIS — Z6837 Body mass index (BMI) 37.0-37.9, adult: Secondary | ICD-10-CM

## 2019-01-17 DIAGNOSIS — D509 Iron deficiency anemia, unspecified: Secondary | ICD-10-CM | POA: Diagnosis present

## 2019-01-17 DIAGNOSIS — Z8673 Personal history of transient ischemic attack (TIA), and cerebral infarction without residual deficits: Secondary | ICD-10-CM | POA: Diagnosis not present

## 2019-01-17 DIAGNOSIS — Z818 Family history of other mental and behavioral disorders: Secondary | ICD-10-CM

## 2019-01-17 DIAGNOSIS — Z8261 Family history of arthritis: Secondary | ICD-10-CM | POA: Diagnosis not present

## 2019-01-17 DIAGNOSIS — I252 Old myocardial infarction: Secondary | ICD-10-CM | POA: Diagnosis not present

## 2019-01-17 DIAGNOSIS — Z823 Family history of stroke: Secondary | ICD-10-CM | POA: Diagnosis not present

## 2019-01-17 DIAGNOSIS — K649 Unspecified hemorrhoids: Secondary | ICD-10-CM | POA: Diagnosis present

## 2019-01-17 DIAGNOSIS — C182 Malignant neoplasm of ascending colon: Secondary | ICD-10-CM | POA: Diagnosis present

## 2019-01-17 DIAGNOSIS — E7800 Pure hypercholesterolemia, unspecified: Secondary | ICD-10-CM | POA: Diagnosis present

## 2019-01-17 DIAGNOSIS — Z833 Family history of diabetes mellitus: Secondary | ICD-10-CM

## 2019-01-17 DIAGNOSIS — D490 Neoplasm of unspecified behavior of digestive system: Secondary | ICD-10-CM | POA: Diagnosis present

## 2019-01-17 DIAGNOSIS — Z90710 Acquired absence of both cervix and uterus: Secondary | ICD-10-CM | POA: Diagnosis not present

## 2019-01-17 DIAGNOSIS — C180 Malignant neoplasm of cecum: Principal | ICD-10-CM | POA: Diagnosis present

## 2019-01-17 HISTORY — DX: Non-ST elevation (NSTEMI) myocardial infarction: I21.4

## 2019-01-17 HISTORY — PX: LAPAROSCOPIC RIGHT HEMI COLECTOMY: SHX5926

## 2019-01-17 SURGERY — LAPAROSCOPIC RIGHT HEMI COLECTOMY
Anesthesia: General | Site: Abdomen | Laterality: Right

## 2019-01-17 MED ORDER — PANTOPRAZOLE SODIUM 40 MG PO TBEC
40.0000 mg | DELAYED_RELEASE_TABLET | Freq: Every day | ORAL | Status: DC
  Administered 2019-01-18 – 2019-01-20 (×3): 40 mg via ORAL
  Filled 2019-01-17 (×4): qty 1

## 2019-01-17 MED ORDER — LACTATED RINGERS IV SOLN
INTRAVENOUS | Status: DC
  Administered 2019-01-17 (×2): via INTRAVENOUS

## 2019-01-17 MED ORDER — BUPIVACAINE-EPINEPHRINE 0.25% -1:200000 IJ SOLN
INTRAMUSCULAR | Status: DC | PRN
  Administered 2019-01-17: 30 mL

## 2019-01-17 MED ORDER — MIDAZOLAM HCL 2 MG/2ML IJ SOLN
INTRAMUSCULAR | Status: AC
  Filled 2019-01-17: qty 2

## 2019-01-17 MED ORDER — ROCURONIUM BROMIDE 10 MG/ML (PF) SYRINGE
PREFILLED_SYRINGE | INTRAVENOUS | Status: DC | PRN
  Administered 2019-01-17: 10 mg via INTRAVENOUS
  Administered 2019-01-17: 70 mg via INTRAVENOUS

## 2019-01-17 MED ORDER — ROCURONIUM BROMIDE 10 MG/ML (PF) SYRINGE
PREFILLED_SYRINGE | INTRAVENOUS | Status: AC
  Filled 2019-01-17: qty 10

## 2019-01-17 MED ORDER — LOSARTAN POTASSIUM 50 MG PO TABS
100.0000 mg | ORAL_TABLET | Freq: Every day | ORAL | Status: DC
  Administered 2019-01-18 – 2019-01-22 (×5): 100 mg via ORAL
  Filled 2019-01-17 (×5): qty 2

## 2019-01-17 MED ORDER — DEXAMETHASONE SODIUM PHOSPHATE 10 MG/ML IJ SOLN
INTRAMUSCULAR | Status: DC | PRN
  Administered 2019-01-17: 10 mg via INTRAVENOUS

## 2019-01-17 MED ORDER — ALVIMOPAN 12 MG PO CAPS
12.0000 mg | ORAL_CAPSULE | Freq: Two times a day (BID) | ORAL | Status: DC
  Administered 2019-01-18 – 2019-01-22 (×9): 12 mg via ORAL
  Filled 2019-01-17 (×9): qty 1

## 2019-01-17 MED ORDER — SCOPOLAMINE 1 MG/3DAYS TD PT72
MEDICATED_PATCH | TRANSDERMAL | Status: DC | PRN
  Administered 2019-01-17: 1 via TRANSDERMAL

## 2019-01-17 MED ORDER — ENSURE SURGERY PO LIQD
237.0000 mL | Freq: Two times a day (BID) | ORAL | Status: DC
  Administered 2019-01-18 – 2019-01-22 (×7): 237 mL via ORAL
  Filled 2019-01-17 (×12): qty 237

## 2019-01-17 MED ORDER — BUPIVACAINE LIPOSOME 1.3 % IJ SUSP
INTRAMUSCULAR | Status: DC | PRN
  Administered 2019-01-17: 20 mL

## 2019-01-17 MED ORDER — CHLORHEXIDINE GLUCONATE 4 % EX LIQD: 60.0000 mL | Freq: Once | CUTANEOUS | Status: DC

## 2019-01-17 MED ORDER — PROPOFOL 10 MG/ML IV BOLUS
INTRAVENOUS | Status: AC
  Filled 2019-01-17: qty 20

## 2019-01-17 MED ORDER — PROPOFOL 10 MG/ML IV BOLUS
INTRAVENOUS | Status: DC | PRN
  Administered 2019-01-17: 110 mg via INTRAVENOUS

## 2019-01-17 MED ORDER — 0.9 % SODIUM CHLORIDE (POUR BTL) OPTIME
TOPICAL | Status: DC | PRN
  Administered 2019-01-17: 2000 mL

## 2019-01-17 MED ORDER — KETOROLAC TROMETHAMINE 30 MG/ML IJ SOLN: 15.0000 mg | Freq: Once | INTRAMUSCULAR | Status: DC | PRN

## 2019-01-17 MED ORDER — ONDANSETRON HCL 4 MG/2ML IJ SOLN
INTRAMUSCULAR | Status: DC | PRN
  Administered 2019-01-17: 4 mg via INTRAVENOUS

## 2019-01-17 MED ORDER — ENOXAPARIN SODIUM 40 MG/0.4ML ~~LOC~~ SOLN
40.0000 mg | SUBCUTANEOUS | Status: DC
  Administered 2019-01-18 – 2019-01-22 (×5): 40 mg via SUBCUTANEOUS
  Filled 2019-01-17 (×3): qty 0.4

## 2019-01-17 MED ORDER — PROMETHAZINE HCL 25 MG/ML IJ SOLN: 6.2500 mg | INTRAMUSCULAR | Status: DC | PRN

## 2019-01-17 MED ORDER — SACCHAROMYCES BOULARDII 250 MG PO CAPS
250.0000 mg | ORAL_CAPSULE | Freq: Two times a day (BID) | ORAL | Status: DC
  Administered 2019-01-18 – 2019-01-22 (×10): 250 mg via ORAL
  Filled 2019-01-17 (×10): qty 1

## 2019-01-17 MED ORDER — SODIUM CHLORIDE 0.9 % IV SOLN
2.0000 g | INTRAVENOUS | Status: AC
  Administered 2019-01-17: 2 g via INTRAVENOUS
  Filled 2019-01-17: qty 2

## 2019-01-17 MED ORDER — TRAMADOL HCL 50 MG PO TABS
50.0000 mg | ORAL_TABLET | Freq: Four times a day (QID) | ORAL | Status: DC | PRN
  Administered 2019-01-18: 50 mg via ORAL
  Filled 2019-01-17: qty 1

## 2019-01-17 MED ORDER — LIDOCAINE 2% (20 MG/ML) 5 ML SYRINGE
INTRAMUSCULAR | Status: AC
  Filled 2019-01-17: qty 5

## 2019-01-17 MED ORDER — SCOPOLAMINE 1 MG/3DAYS TD PT72
MEDICATED_PATCH | TRANSDERMAL | Status: AC
  Filled 2019-01-17: qty 1

## 2019-01-17 MED ORDER — KCL IN DEXTROSE-NACL 20-5-0.45 MEQ/L-%-% IV SOLN
INTRAVENOUS | Status: DC
  Administered 2019-01-17 – 2019-01-20 (×8): via INTRAVENOUS
  Filled 2019-01-17 (×9): qty 1000

## 2019-01-17 MED ORDER — KETAMINE HCL 10 MG/ML IJ SOLN
INTRAMUSCULAR | Status: DC | PRN
  Administered 2019-01-17: 40 mg via INTRAVENOUS

## 2019-01-17 MED ORDER — HEPARIN SODIUM (PORCINE) 5000 UNIT/ML IJ SOLN
5000.0000 [IU] | Freq: Once | INTRAMUSCULAR | Status: AC
  Administered 2019-01-17: 5000 [IU] via SUBCUTANEOUS
  Filled 2019-01-17: qty 1

## 2019-01-17 MED ORDER — FENTANYL CITRATE (PF) 100 MCG/2ML IJ SOLN: 25.0000 ug | INTRAMUSCULAR | Status: DC | PRN

## 2019-01-17 MED ORDER — ACETAMINOPHEN 500 MG PO TABS
1000.0000 mg | ORAL_TABLET | ORAL | Status: AC
  Administered 2019-01-17: 1000 mg via ORAL
  Filled 2019-01-17: qty 2

## 2019-01-17 MED ORDER — GABAPENTIN 300 MG PO CAPS
300.0000 mg | ORAL_CAPSULE | ORAL | Status: AC
  Administered 2019-01-17: 06:00:00 300 mg via ORAL
  Filled 2019-01-17: qty 1

## 2019-01-17 MED ORDER — DEXAMETHASONE SODIUM PHOSPHATE 10 MG/ML IJ SOLN
INTRAMUSCULAR | Status: AC
  Filled 2019-01-17: qty 1

## 2019-01-17 MED ORDER — LIDOCAINE HCL 2 % IJ SOLN
INTRAMUSCULAR | Status: AC
  Filled 2019-01-17: qty 20

## 2019-01-17 MED ORDER — LIDOCAINE 2% (20 MG/ML) 5 ML SYRINGE
INTRAMUSCULAR | Status: DC | PRN
  Administered 2019-01-17: 1.5 mg/kg/h via INTRAVENOUS

## 2019-01-17 MED ORDER — AMLODIPINE BESYLATE 5 MG PO TABS
5.0000 mg | ORAL_TABLET | Freq: Every day | ORAL | Status: DC
  Administered 2019-01-18 – 2019-01-22 (×5): 5 mg via ORAL
  Filled 2019-01-17 (×5): qty 1

## 2019-01-17 MED ORDER — MORPHINE SULFATE (PF) 2 MG/ML IV SOLN: 1.0000 mg | INTRAVENOUS | Status: DC | PRN

## 2019-01-17 MED ORDER — SUCCINYLCHOLINE CHLORIDE 200 MG/10ML IV SOSY
PREFILLED_SYRINGE | INTRAVENOUS | Status: AC
  Filled 2019-01-17: qty 10

## 2019-01-17 MED ORDER — ONDANSETRON HCL 4 MG/2ML IJ SOLN
4.0000 mg | Freq: Four times a day (QID) | INTRAMUSCULAR | Status: DC | PRN
  Administered 2019-01-18: 4 mg via INTRAVENOUS
  Filled 2019-01-17: qty 2

## 2019-01-17 MED ORDER — HYDROCODONE-ACETAMINOPHEN 5-325 MG PO TABS
1.0000 | ORAL_TABLET | ORAL | Status: DC | PRN
  Administered 2019-01-20: 2 via ORAL
  Filled 2019-01-17: qty 2

## 2019-01-17 MED ORDER — ALVIMOPAN 12 MG PO CAPS
12.0000 mg | ORAL_CAPSULE | ORAL | Status: AC
  Administered 2019-01-17: 12 mg via ORAL
  Filled 2019-01-17: qty 1

## 2019-01-17 MED ORDER — BUPIVACAINE-EPINEPHRINE (PF) 0.25% -1:200000 IJ SOLN
INTRAMUSCULAR | Status: AC
  Filled 2019-01-17: qty 30

## 2019-01-17 MED ORDER — ONDANSETRON HCL 4 MG PO TABS: 4.0000 mg | ORAL_TABLET | Freq: Four times a day (QID) | ORAL | Status: DC | PRN

## 2019-01-17 MED ORDER — LIDOCAINE 2% (20 MG/ML) 5 ML SYRINGE
INTRAMUSCULAR | Status: DC | PRN
  Administered 2019-01-17: 100 mg via INTRAVENOUS

## 2019-01-17 MED ORDER — MIDAZOLAM HCL 2 MG/2ML IJ SOLN
INTRAMUSCULAR | Status: DC | PRN
  Administered 2019-01-17: 2 mg via INTRAVENOUS

## 2019-01-17 MED ORDER — LACTATED RINGERS IR SOLN
Status: DC | PRN
  Administered 2019-01-17: 1000 mL

## 2019-01-17 MED ORDER — FENTANYL CITRATE (PF) 250 MCG/5ML IJ SOLN
INTRAMUSCULAR | Status: DC | PRN
  Administered 2019-01-17 (×2): 50 ug via INTRAVENOUS
  Administered 2019-01-17: 100 ug via INTRAVENOUS

## 2019-01-17 MED ORDER — SUGAMMADEX SODIUM 200 MG/2ML IV SOLN
INTRAVENOUS | Status: DC | PRN
  Administered 2019-01-17: 200 mg via INTRAVENOUS

## 2019-01-17 MED ORDER — ONDANSETRON HCL 4 MG/2ML IJ SOLN
INTRAMUSCULAR | Status: AC
  Filled 2019-01-17: qty 2

## 2019-01-17 MED ORDER — KETAMINE HCL 10 MG/ML IJ SOLN
INTRAMUSCULAR | Status: AC
  Filled 2019-01-17: qty 1

## 2019-01-17 MED ORDER — FENTANYL CITRATE (PF) 250 MCG/5ML IJ SOLN
INTRAMUSCULAR | Status: AC
  Filled 2019-01-17: qty 5

## 2019-01-17 SURGICAL SUPPLY — 76 items
APPLIER CLIP 5 13 M/L LIGAMAX5 (MISCELLANEOUS)
APPLIER CLIP ROT 10 11.4 M/L (STAPLE)
BLADE EXTENDED COATED 6.5IN (ELECTRODE) IMPLANT
BLADE HEX COATED 2.75 (ELECTRODE) IMPLANT
CABLE HIGH FREQUENCY MONO STRZ (ELECTRODE) ×2 IMPLANT
CELLS DAT CNTRL 66122 CELL SVR (MISCELLANEOUS) IMPLANT
CLIP APPLIE 5 13 M/L LIGAMAX5 (MISCELLANEOUS) IMPLANT
CLIP APPLIE ROT 10 11.4 M/L (STAPLE) IMPLANT
COVER SURGICAL LIGHT HANDLE (MISCELLANEOUS) ×4 IMPLANT
COVER WAND RF STERILE (DRAPES) IMPLANT
DECANTER SPIKE VIAL GLASS SM (MISCELLANEOUS) ×2 IMPLANT
DERMABOND ADVANCED (GAUZE/BANDAGES/DRESSINGS) ×1
DERMABOND ADVANCED .7 DNX12 (GAUZE/BANDAGES/DRESSINGS) ×1 IMPLANT
DISSECTOR BLUNT TIP ENDO 5MM (MISCELLANEOUS) IMPLANT
DRAIN CHANNEL 19F RND (DRAIN) IMPLANT
DRAPE UTILITY XL STRL (DRAPES) ×2 IMPLANT
DRSG OPSITE POSTOP 4X10 (GAUZE/BANDAGES/DRESSINGS) IMPLANT
DRSG OPSITE POSTOP 4X6 (GAUZE/BANDAGES/DRESSINGS) ×2 IMPLANT
DRSG OPSITE POSTOP 4X8 (GAUZE/BANDAGES/DRESSINGS) IMPLANT
ELECT PENCIL ROCKER SW 15FT (MISCELLANEOUS) ×2 IMPLANT
ELECT REM PT RETURN 15FT ADLT (MISCELLANEOUS) ×2 IMPLANT
EVACUATOR SILICONE 100CC (DRAIN) IMPLANT
GAUZE SPONGE 4X4 12PLY STRL (GAUZE/BANDAGES/DRESSINGS) IMPLANT
GLOVE BIO SURGEON STRL SZ7.5 (GLOVE) ×4 IMPLANT
GLOVE BIOGEL PI IND STRL 7.0 (GLOVE) ×4 IMPLANT
GLOVE BIOGEL PI IND STRL 7.5 (GLOVE) ×2 IMPLANT
GLOVE BIOGEL PI INDICATOR 7.0 (GLOVE) ×4
GLOVE BIOGEL PI INDICATOR 7.5 (GLOVE) ×2
GLOVE SURG SS PI 7.0 STRL IVOR (GLOVE) ×12 IMPLANT
GLOVE SURG SYN 7.5  E (GLOVE) ×2
GLOVE SURG SYN 7.5 E (GLOVE) ×2 IMPLANT
GOWN STRL REUS W/ TWL LRG LVL3 (GOWN DISPOSABLE) ×4 IMPLANT
GOWN STRL REUS W/TWL LRG LVL3 (GOWN DISPOSABLE) ×4
GOWN STRL REUS W/TWL XL LVL3 (GOWN DISPOSABLE) ×8 IMPLANT
HOLDER FOLEY CATH W/STRAP (MISCELLANEOUS) ×2 IMPLANT
KIT TURNOVER KIT A (KITS) IMPLANT
NEEDLE HYPO 25X1 1.5 SAFETY (NEEDLE) ×2 IMPLANT
PACK COLON (CUSTOM PROCEDURE TRAY) ×2 IMPLANT
PAD POSITIONING PINK XL (MISCELLANEOUS) ×2 IMPLANT
PORT LAP GEL ALEXIS MED 5-9CM (MISCELLANEOUS) ×2 IMPLANT
PROTECTOR NERVE ULNAR (MISCELLANEOUS) ×2 IMPLANT
RELOAD PROXIMATE 75MM BLUE (ENDOMECHANICALS) ×4 IMPLANT
RTRCTR WOUND ALEXIS 18CM MED (MISCELLANEOUS)
SET IRRIG TUBING LAPAROSCOPIC (IRRIGATION / IRRIGATOR) ×2 IMPLANT
SET TUBE SMOKE EVAC HIGH FLOW (TUBING) ×2 IMPLANT
SHEARS HARMONIC ACE PLUS 36CM (ENDOMECHANICALS) ×2 IMPLANT
SLEEVE ADV FIXATION 5X100MM (TROCAR) ×4 IMPLANT
STAPLER PROXIMATE 75MM BLUE (STAPLE) ×2 IMPLANT
STAPLER VISISTAT 35W (STAPLE) IMPLANT
SUT ETHILON 3 0 PS 1 (SUTURE) IMPLANT
SUT MNCRL AB 4-0 PS2 18 (SUTURE) ×2 IMPLANT
SUT NOVA NAB DX-16 0-1 5-0 T12 (SUTURE) IMPLANT
SUT PDS AB 1 CT1 27 (SUTURE) ×6 IMPLANT
SUT PDS AB 1 CTX 36 (SUTURE) IMPLANT
SUT PDS AB 1 TP1 96 (SUTURE) IMPLANT
SUT PROLENE 2 0 KS (SUTURE) IMPLANT
SUT PROLENE 2 0 SH DA (SUTURE) IMPLANT
SUT SILK 2 0 (SUTURE) ×1
SUT SILK 2 0 SH CR/8 (SUTURE) ×2 IMPLANT
SUT SILK 2-0 18XBRD TIE 12 (SUTURE) ×1 IMPLANT
SUT SILK 3 0 (SUTURE) ×1
SUT SILK 3 0 SH CR/8 (SUTURE) ×4 IMPLANT
SUT SILK 3-0 18XBRD TIE 12 (SUTURE) ×1 IMPLANT
SUT VIC AB 2-0 SH 27 (SUTURE) ×2
SUT VIC AB 2-0 SH 27X BRD (SUTURE) ×2 IMPLANT
SUT VIC AB 3-0 SH 18 (SUTURE) IMPLANT
SYS LAPSCP GELPORT 120MM (MISCELLANEOUS)
SYSTEM LAPSCP GELPORT 120MM (MISCELLANEOUS) IMPLANT
TOWEL OR NON WOVEN STRL DISP B (DISPOSABLE) IMPLANT
TRAY FOLEY MTR SLVR 14FR STAT (SET/KITS/TRAYS/PACK) ×2 IMPLANT
TROCAR ADV FIXATION 11X100MM (TROCAR) IMPLANT
TROCAR ADV FIXATION 12X100MM (TROCAR) IMPLANT
TROCAR ADV FIXATION 5X100MM (TROCAR) ×2 IMPLANT
TROCAR BLADELESS OPT 5 100 (ENDOMECHANICALS) ×2 IMPLANT
TROCAR XCEL BLUNT TIP 100MML (ENDOMECHANICALS) IMPLANT
TUBING CONNECTING 10 (TUBING) ×4 IMPLANT

## 2019-01-17 NOTE — Op Note (Addendum)
01/17/2019  10:26 AM  PATIENT:  [name], 73 y.o., female, MRN: [id number]  PREOP DIAGNOSIS:  cecal colon Cancer  POSTOP DIAGNOSIS:   Cecal colon cancer  PROCEDURE:   Procedure(s):  LAPAROSCOPIC assisted RIGHT HEMI COLECTOMY  SURGEON:   Alphonsa Overall, M.D.  ASSISTANT:   Carlean Jews Kinsinger. M.D.  ANESTHESIA:   general  Anesthesiologist: Myrtie Soman, MD CRNA: Sharlette Dense, CRNA; Williford, Peggy D, CRNA  General  EBL:  100  ml  BLOOD ADMINISTERED: none  DRAINS: none   LOCAL MEDICATIONS USED:   20 cc of Exparel and 30 cc of Marcaine  SPECIMEN:   Right colon  COUNTS CORRECT:  YES  INDICATIONS FOR PROCEDURE:  [name] is a 73 y.o. (DOB: May 18, 1946) white female whose primary care physician is Chesley Noon, MD and comes for right hemicolectomy for a cecal cancer.   The indications and risks of the surgery were explained to the patient.  The risks include, but are not limited to, infection, bleeding, and nerve injury.  PROCEDURE: The patient was taken to room #1 at [facility].  She underwent a general tracheal anesthetic.  She had a Foley catheter placed.  Both her arms were tucked.  Her abdomen is prepped with ChloraPrep and sterilely draped.  A timeout was held and surgical checklist run.  I accessed her abdominal cavity with a 5 mm Ethicon Optiview trocar in the left upper quadrant.  I placed 3 additional trochars: A 5 mm trocar left midabdomen, a 5 mm trocar left lower quadrant, and 5 mm trocar in the right lower quadrant.  I placed a TAP block under laparoscopic observation using the local anesthesia.  I placed 15 cc per side.  An abdominal exploration with laparoscope revealed an unremarkable liver.  Both lobes of the liver were well seen.  She had adhesions of the omentum to the gallbladder bed from a prior cholecystectomy.  The anterior wall of the stomach and the bowel that I could see was unremarkable.  Her uterus was gone.   Her ovaries were unremarkable.  I then started dissection of the right colon.  The tip of appendix had sort of an abnormal appearance to it and will be included in the specimen.  I mobilized the terminal ileum and right colon along the pelvic brim.  Identified the ureter on the right side and left this posterior to our dissection.  I took the dissection under the right colon up to the duodenum.  I then reflected the right colon medially.  I took down adhesions of the hepatic flexure to the gallbladder bed.  I mobilized the right transverse colon.  At this point I thought I had a mobile enough colon for resection.  I then made a right transverse abdominal incision.  I went through the anterior posterior rectus fascia was and try to do a muscle-splitting incision.  I placed a wound protector in the wound.  I then eviscerated the right colon which had an approximate 4 cm mass in the cecum.  I divided the terminal ileum with a 75 mm GIA stapler.  I divided the hepatic flexure with the 75 mm GIA stapler.  I then resected the right colon down to the base of the mesentery.  I ligated the vessels with 2-0 silk suture.  I then carried out a side-to-side stapled anastomosis with a 75 mm GIA stapler.  I closed the enterotomy with interrupted 3-0 silk sutures.  I closed the mesenteric defect with  a running 2-0 Vicryl suture.  And I did imbricating sutures of the right colon and jejunum.  The anastomosis was then returned to the abdominal cavity.  We laparoscope the patient.  The anastomosis lay on the right side of the abdomen.  There was no bleeding or evidence of twisting of the bowel.  I then stop the laparoscopic portion of the operation.  We changed our closing set to new instruments and regowned and draped.  The posterior fascia and the anterior fascia was closed with a running #1 PDS suture.  I placed the remainder of the local anesthesia, 20 cc, and the transverse incision and the trocar sites.  The wound  was irrigated.  Subcutaneous tissue was closed with interrupted 2-0 Vicryl sutures.  And the skin at the site was closed with 4-0 Monocryl suture and painted with Dermabond.    .  Final sponge and needle count were correct area the patient was transported recovery in good condition.  Alphonsa Overall, MD, [facility] Surgery Pager: [phone] Office phone:  [phone]

## 2019-01-17 NOTE — Interval H&P Note (Signed)
History and Physical Interval Note:  01/17/2019 7:15 AM  [name]  has presented today for surgery, with the diagnosis of cecal colon Cancer.  The various methods of treatment have been discussed with the patient and family. Marland Kitchen Her husband is with her.  She did not take much of the bowel prep.  After consideration of risks, benefits and other options for treatment, the patient has consented to  Procedure(s): LAPAROSCOPIC RIGHT HEMI COLECTOMY (Right) as a surgical intervention.  The patient's history has been reviewed, patient examined, no change in status, stable for surgery.  I have reviewed the patient's chart and labs.  Questions were answered to the patient's satisfaction.     Shann Medal

## 2019-01-17 NOTE — Anesthesia Procedure Notes (Signed)
Procedure Name: Intubation Date/Time: 01/17/2019 7:29 AM Performed by: Sharlette Dense, CRNA Patient Re-evaluated:Patient Re-evaluated prior to induction Oxygen Delivery Method: Circle system utilized Preoxygenation: Pre-oxygenation with 100% oxygen Induction Type: IV induction Ventilation: Mask ventilation without difficulty and Oral airway inserted - appropriate to patient size Laryngoscope Size: Mac and 3 Grade View: Grade II Tube type: Oral Tube size: 7.5 mm Number of attempts: 1 Airway Equipment and Method: Stylet Placement Confirmation: ETT inserted through vocal cords under direct vision,  positive ETCO2 and breath sounds checked- equal and bilateral Secured at: 21 cm Tube secured with: Tape Dental Injury: Teeth and Oropharynx as per pre-operative assessment  Comments: Intubation per Delos Haring, EMT Paramedic student

## 2019-01-17 NOTE — Transfer of Care (Signed)
Immediate Anesthesia Transfer of Care Note  Patient: [name]  Procedure(s) Performed: LAPAROSCOPIC RIGHT HEMI COLECTOMY (Right Abdomen)  Patient Location: PACU  Anesthesia Type:General  Level of Consciousness: drowsy  Airway & Oxygen Therapy: Patient Spontanous Breathing and Patient connected to face mask oxygen  Post-op Assessment: Report given to RN and Post -op Vital signs reviewed and stable  Post vital signs: Reviewed and stable  Last Vitals:  Vitals Value Taken Time  BP    Temp    Pulse 90 01/17/19 1023  Resp 8 01/17/19 1023  SpO2 100 % 01/17/19 1023  Vitals shown include unvalidated device data.  Last Pain:  Vitals:   01/17/19 0605  TempSrc: Oral         Complications: No apparent anesthesia complications

## 2019-01-18 ENCOUNTER — Encounter (HOSPITAL_COMMUNITY): Payer: Self-pay | Admitting: Surgery

## 2019-01-18 LAB — CBC
HCT: 35.5 % — ABNORMAL LOW (ref 36.0–46.0)
Hemoglobin: 11.3 g/dL — ABNORMAL LOW (ref 12.0–15.0)
MCH: 27.2 pg (ref 26.0–34.0)
MCHC: 31.8 g/dL (ref 30.0–36.0)
MCV: 85.3 fL (ref 80.0–100.0)
Platelets: 311 10*3/uL (ref 150–400)
RBC: 4.16 MIL/uL (ref 3.87–5.11)
RDW: 20.2 % — ABNORMAL HIGH (ref 11.5–15.5)
WBC: 14.3 10*3/uL — ABNORMAL HIGH (ref 4.0–10.5)
nRBC: 0 % (ref 0.0–0.2)

## 2019-01-18 LAB — BASIC METABOLIC PANEL
Anion gap: 9 (ref 5–15)
BUN: 14 mg/dL (ref 8–23)
CO2: 26 mmol/L (ref 22–32)
Calcium: 9.3 mg/dL (ref 8.9–10.3)
Chloride: 100 mmol/L (ref 98–111)
Creatinine, Ser: 1.17 mg/dL — ABNORMAL HIGH (ref 0.44–1.00)
GFR calc Af Amer: 54 mL/min — ABNORMAL LOW (ref >60)
GFR calc non Af Amer: 46 mL/min — ABNORMAL LOW (ref >60)
Glucose, Bld: 151 mg/dL — ABNORMAL HIGH (ref 70–99)
Potassium: 3.9 mmol/L (ref 3.5–5.1)
Sodium: 135 mmol/L (ref 135–145)

## 2019-01-18 MED ORDER — SIMETHICONE 80 MG PO CHEW
80.0000 mg | CHEWABLE_TABLET | Freq: Four times a day (QID) | ORAL | Status: DC | PRN
  Administered 2019-01-18 – 2019-01-20 (×5): 80 mg via ORAL
  Filled 2019-01-18 (×5): qty 1

## 2019-01-18 MED ORDER — SUCCINYLCHOLINE CHLORIDE 200 MG/10ML IV SOSY
PREFILLED_SYRINGE | INTRAVENOUS | Status: AC
  Filled 2019-01-18: qty 10

## 2019-01-18 MED ORDER — ETOMIDATE 2 MG/ML IV SOLN
INTRAVENOUS | Status: AC
  Filled 2019-01-18: qty 10

## 2019-01-18 NOTE — Progress Notes (Addendum)
[facility] Office:  [phone] General Surgery Progress Note   LOS: 1 day  POD -  1 Day Post-Op  Chief Complaint: Colon cancer  Assessment and Plan: 1.  LAPAROSCOPIC assisted RIGHT HEMI COLECTOMY - 01/17/2019 - D. Teegan Guinther  For cecal cancer  On Entereg  Doing okay, nausea better  2. CAD s/p NSTEMI           Cardiac catheterization - 02/26/2016 - with DES             She has been followed by Dr. Martinique 3. HTN 4. Hyperlipidemia 5. CVA - 1996  Some minimal left leg weakness 6. Obese - BMI - 37 7. DVT prophylaxis - Lovenox 8.  Foley - to d/c   Active Problems:   Cancer of right colon ([place])  Subjective:  Less nausea than yesterday.  No vomiting.  Has not walked in the hall yet.  Husband in the room.  He looked me up on Facebook.  Her husband knows Gretta Cool - he did surgery on him.  Objective:   Vitals:   01/18/19 0211 01/18/19 0626  BP: (!) 155/73 (!) 160/71  Pulse: 71 69  Resp: 18 18  Temp: 98.9 F (37.2 C) 98.1 F (36.7 C)  SpO2: 98% 95%     Intake/Output from previous day:  08/11 0701 - 08/12 0700 In: 4053.2 [P.O.:800; I.V.:3153.2; IV Piggyback:100] Out: 1200 [Urine:1150; Blood:50]  Intake/Output this shift:  No intake/output data recorded.   Physical Exam:   General: Obese WF who is alert and oriented.    HEENT: Normal. Pupils equal. .   Lungs: Clear    IS = 750cc   Abdomen: Soft, rare BS   Wound: Clean   Lab Results:    Recent Labs    01/18/19 0352  WBC 14.3*  HGB 11.3*  HCT 35.5*  PLT 311    BMET   Recent Labs    01/18/19 0352  NA 135  K 3.9  CL 100  CO2 26  GLUCOSE 151*  BUN 14  CREATININE 1.17*  CALCIUM 9.3    PT/INR  No results for input(s): LABPROT, INR in the last 72 hours.  ABG  No results for input(s): PHART, HCO3 in the last 72 hours.  Invalid input(s): PCO2, PO2   Studies/Results:  No results found.   Anti-infectives:   Anti-infectives (From admission, onward)   Start     Dose/Rate  Route Frequency Ordered Stop   01/17/19 0600  cefoTEtan (CEFOTAN) 2 g in sodium chloride 0.9 % 100 mL IVPB     2 g 200 mL/hr over 30 Minutes Intravenous On call to O.R. 01/17/19 0547 01/17/19 1240      Alphonsa Overall, MD, FACS Pager: [place] [facility] Office: [phone] 01/18/2019

## 2019-01-19 LAB — CBC WITH DIFFERENTIAL/PLATELET
Abs Immature Granulocytes: 0.05 10*3/uL (ref 0.00–0.07)
Basophils Absolute: 0 10*3/uL (ref 0.0–0.1)
Basophils Relative: 0 %
Eosinophils Absolute: 0 10*3/uL (ref 0.0–0.5)
Eosinophils Relative: 0 %
HCT: 35.7 % — ABNORMAL LOW (ref 36.0–46.0)
Hemoglobin: 11.4 g/dL — ABNORMAL LOW (ref 12.0–15.0)
Immature Granulocytes: 0 %
Lymphocytes Relative: 19 %
Lymphs Abs: 2.2 10*3/uL (ref 0.7–4.0)
MCH: 27 pg (ref 26.0–34.0)
MCHC: 31.9 g/dL (ref 30.0–36.0)
MCV: 84.6 fL (ref 80.0–100.0)
Monocytes Absolute: 0.9 10*3/uL (ref 0.1–1.0)
Monocytes Relative: 7 %
Neutro Abs: 8.8 10*3/uL — ABNORMAL HIGH (ref 1.7–7.7)
Neutrophils Relative %: 74 %
Platelets: 289 10*3/uL (ref 150–400)
RBC: 4.22 MIL/uL (ref 3.87–5.11)
RDW: 20.7 % — ABNORMAL HIGH (ref 11.5–15.5)
WBC: 12 10*3/uL — ABNORMAL HIGH (ref 4.0–10.5)
nRBC: 0 % (ref 0.0–0.2)

## 2019-01-19 LAB — BASIC METABOLIC PANEL
Anion gap: 7 (ref 5–15)
BUN: 9 mg/dL (ref 8–23)
CO2: 24 mmol/L (ref 22–32)
Calcium: 9 mg/dL (ref 8.9–10.3)
Chloride: 104 mmol/L (ref 98–111)
Creatinine, Ser: 1.05 mg/dL — ABNORMAL HIGH (ref 0.44–1.00)
GFR calc Af Amer: >60 mL/min (ref >60)
GFR calc non Af Amer: 53 mL/min — ABNORMAL LOW (ref >60)
Glucose, Bld: 126 mg/dL — ABNORMAL HIGH (ref 70–99)
Potassium: 4.3 mmol/L (ref 3.5–5.1)
Sodium: 135 mmol/L (ref 135–145)

## 2019-01-19 NOTE — Progress Notes (Signed)
Pt refused to ambulate in the hall again. Pt was educated regarding the need and benefit of ambulating post operatively. Pt continued to refuse.

## 2019-01-19 NOTE — Progress Notes (Signed)
[facility] Office:  [phone] General Surgery Progress Note   LOS: 2 days  POD -  2 Days Post-Op  Chief Complaint: Colon cancer  Assessment and Plan: 1.  LAPAROSCOPIC assisted RIGHT HEMI COLECTOMY - 01/17/2019 - D. Estefan Pattison  For cecal cancer  On Entereg  Passed flatus, but nauseated last PM.  Will keep on liquids.  Needs to ambulate more.  2. CAD s/p NSTEMI           Cardiac catheterization - 02/26/2016 - with DES             She has been followed by Dr. Martinique 3. HTN 4. Hyperlipidemia 5. CVA - 1996  Some minimal left leg weakness 6. Obese - BMI - 37 7. DVT prophylaxis - Lovenox   Active Problems:   Cancer of right colon ([facility])  Subjective:  Only walked once yesterday.  Nauseated last PM.  But has passed flatus.  Discussed walking more.  Husband  looked me up on Facebook.  Her husband knows Gretta Cool - he did surgery on him.  Objective:   Vitals:   01/19/19 0549 01/19/19 0628  BP: (!) 175/92 (!) 155/77  Pulse: 87 94  Resp: 18 18  Temp: 98.8 F (37.1 C)   SpO2: 98% 97%     Intake/Output from previous day:  08/12 0701 - 08/13 0700 In: 3626.5 [P.O.:1560; I.V.:2066.5] Out: 3225 [Urine:3225]  Intake/Output this shift:  No intake/output data recorded.   Physical Exam:   General: Obese WF who is alert and oriented.    HEENT: Normal. Pupils equal. .   Lungs: Clear    IS = 800 cc  Seemed to do better today.   Abdomen: Has BS, but more distended.   Wound: Clean   Lab Results:    Recent Labs    01/18/19 0352 01/19/19 0335  WBC 14.3* 12.0*  HGB 11.3* 11.4*  HCT 35.5* 35.7*  PLT 311 289    BMET   Recent Labs    01/18/19 0352 01/19/19 0335  NA 135 135  K 3.9 4.3  CL 100 104  CO2 26 24  GLUCOSE 151* 126*  BUN 14 9  CREATININE 1.17* 1.05*  CALCIUM 9.3 9.0    PT/INR  No results for input(s): LABPROT, INR in the last 72 hours.  ABG  No results for input(s): PHART, HCO3 in the last 72 hours.  Invalid input(s): PCO2,  PO2   Studies/Results:  No results found.   Anti-infectives:   Anti-infectives (From admission, onward)   Start     Dose/Rate Route Frequency Ordered Stop   01/17/19 0600  cefoTEtan (CEFOTAN) 2 g in sodium chloride 0.9 % 100 mL IVPB     2 g 200 mL/hr over 30 Minutes Intravenous On call to O.R. 01/17/19 0547 01/17/19 1240      Alphonsa Overall, MD, FACS Pager: [facility] Office: [phone] 01/19/2019

## 2019-01-19 NOTE — Progress Notes (Addendum)
Pt has now agreed to ambulate in the hall. Ambulated 720 ft in the hall.

## 2019-01-19 NOTE — Progress Notes (Signed)
Patient states she does not want to walk this time and was educated on purpose of ambulation.   Norlene Duel RN, BSN

## 2019-01-19 NOTE — Anesthesia Postprocedure Evaluation (Signed)
Anesthesia Post Note  Patient: Toniann Dickerson  Procedure(s) Performed: LAPAROSCOPIC RIGHT HEMI COLECTOMY (Right Abdomen)     Patient location during evaluation: PACU Anesthesia Type: General Level of consciousness: awake and alert Pain management: pain level controlled Vital Signs Assessment: post-procedure vital signs reviewed and stable Respiratory status: spontaneous breathing, nonlabored ventilation, respiratory function stable and patient connected to nasal cannula oxygen Cardiovascular status: blood pressure returned to baseline and stable Postop Assessment: no apparent nausea or vomiting Anesthetic complications: no    Last Vitals:  Vitals:   01/19/19 0549 01/19/19 0628  BP: (!) 175/92 (!) 155/77  Pulse: 87 94  Resp: 18 18  Temp: 37.1 C   SpO2: 98% 97%    Last Pain:  Vitals:   01/19/19 0549  TempSrc: Oral  PainSc:                  Hendrick Pavich S

## 2019-01-19 NOTE — Progress Notes (Addendum)
Alphonsa Overall, MD was paged regarding the pt's BP of 170/95 and HR of 102.   MD responded to page and advised to have the pt ambulate in the hall. MD did not order any additional meds for BP. I will continue to monitor.  Pt refused to ambulate in hall at this time. Pt agreed to ambulate around 1700.

## 2019-01-20 LAB — CBC WITH DIFFERENTIAL/PLATELET
Abs Immature Granulocytes: 0.04 10*3/uL (ref 0.00–0.07)
Basophils Absolute: 0.1 10*3/uL (ref 0.0–0.1)
Basophils Relative: 1 %
Eosinophils Absolute: 0.2 10*3/uL (ref 0.0–0.5)
Eosinophils Relative: 2 %
HCT: 38.7 % (ref 36.0–46.0)
Hemoglobin: 11.8 g/dL — ABNORMAL LOW (ref 12.0–15.0)
Immature Granulocytes: 0 %
Lymphocytes Relative: 22 %
Lymphs Abs: 2.2 10*3/uL (ref 0.7–4.0)
MCH: 26.4 pg (ref 26.0–34.0)
MCHC: 30.5 g/dL (ref 30.0–36.0)
MCV: 86.6 fL (ref 80.0–100.0)
Monocytes Absolute: 0.9 10*3/uL (ref 0.1–1.0)
Monocytes Relative: 8 %
Neutro Abs: 6.7 10*3/uL (ref 1.7–7.7)
Neutrophils Relative %: 67 %
Platelets: 312 10*3/uL (ref 150–400)
RBC: 4.47 MIL/uL (ref 3.87–5.11)
RDW: 20.6 % — ABNORMAL HIGH (ref 11.5–15.5)
WBC: 10.1 10*3/uL (ref 4.0–10.5)
nRBC: 0 % (ref 0.0–0.2)

## 2019-01-20 LAB — BASIC METABOLIC PANEL
Anion gap: 8 (ref 5–15)
BUN: 7 mg/dL — ABNORMAL LOW (ref 8–23)
CO2: 25 mmol/L (ref 22–32)
Calcium: 9 mg/dL (ref 8.9–10.3)
Chloride: 101 mmol/L (ref 98–111)
Creatinine, Ser: 1.07 mg/dL — ABNORMAL HIGH (ref 0.44–1.00)
GFR calc Af Amer: 60 mL/min — ABNORMAL LOW (ref >60)
GFR calc non Af Amer: 51 mL/min — ABNORMAL LOW (ref >60)
Glucose, Bld: 123 mg/dL — ABNORMAL HIGH (ref 70–99)
Potassium: 4.3 mmol/L (ref 3.5–5.1)
Sodium: 134 mmol/L — ABNORMAL LOW (ref 135–145)

## 2019-01-20 NOTE — Progress Notes (Addendum)
[place] [facility] Office:  [phone] General Surgery Progress Note   LOS: 3 days  POD -  3 Days Post-Op  Chief Complaint: Colon cancer  Assessment and Plan: 1.  LAPAROSCOPIC assisted RIGHT HEMI COLECTOMY - 01/17/2019 - D. 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  She walked only once yesterday.  She needs to ambulate more.  2. CAD s/p NSTEMI           Cardiac catheterization - 02/26/2016 - with DES             She has been followed by Dr. Martinique 3. HTN 4. Hyperlipidemia 5. CVA - 1996  Some minimal left leg weakness 6. Obese - BMI - 37 7. DVT prophylaxis - Lovenox   Active Problems:   Cancer of right colon ([facility])  Subjective:  Distended and uncomfortable.  No nausea.  No BM or flatus last 24 hours.  But walked only once yesterday.  We talked about getting up to walk/move.  Husband  looked me up on Facebook.  Her husband knows Gretta Cool - he did surgery on him.  Objective:   Vitals:   01/19/19 2202 01/20/19 0514  BP: (!) 167/92 (!) 155/81  Pulse: 100 (!) 101  Resp: 16 18  Temp: 98.1 F (36.7 C) 98.4 F (36.9 C)  SpO2: 96% 95%     Intake/Output from previous day:  08/13 0701 - 08/14 0700 In: 2228.2 [P.O.:600; I.V.:1628.2] Out: 1200 [Urine:1200]  Intake/Output this shift:  No intake/output data recorded.   Physical Exam:   General: Obese WF who is alert and oriented.    HEENT: Normal. Pupils equal. .   Lungs: Clear    IS = 800 cc  About the same as yesterday.   Abdomen: Has few BS.  Distention about the same as yesterday.    Wound: Clean   Lab Results:    Recent Labs    01/19/19 0335 01/20/19 0336  WBC 12.0* 10.1  HGB 11.4* 11.8*  HCT 35.7* 38.7  PLT 289 312    BMET   Recent Labs    01/19/19 0335 01/20/19 0336  NA 135 134*  K 4.3 4.3  CL 104 101  CO2 24 25  GLUCOSE 126* 123*  BUN 9 7*  CREATININE 1.05* 1.07*  CALCIUM 9.0 9.0     PT/INR  No results for input(s): LABPROT, INR in the last 72 hours.  ABG  No results for input(s): PHART, HCO3 in the last 72 hours.  Invalid input(s): PCO2, PO2   Studies/Results:  No results found.   Anti-infectives:   Anti-infectives (From admission, onward)   Start     Dose/Rate Route Frequency Ordered Stop   01/17/19 0600  cefoTEtan (CEFOTAN) 2 g in sodium chloride 0.9 % 100 mL IVPB     2 g 200 mL/hr over 30 Minutes Intravenous On call to O.R. 01/17/19 0547 01/17/19 1240      Alphonsa Overall, MD, FACS Pager: [facility] Office: [phone] 01/20/2019

## 2019-01-20 NOTE — Care Management Important Message (Signed)
Important Message  Patient Details IM Letter given to Marney Doctor RN to present to the Patient Name: [name] MRN: [id number] Date of Birth: Sep 07, 1945   Medicare Important Message Given:  Yes     Kerin Salen 01/20/2019, 9:44 AM

## 2019-01-21 MED ORDER — METHOCARBAMOL 1000 MG/10ML IJ SOLN
1000.0000 mg | Freq: Four times a day (QID) | INTRAVENOUS | Status: DC | PRN
  Filled 2019-01-21: qty 10

## 2019-01-21 MED ORDER — PSYLLIUM 95 % PO PACK
1.0000 | PACK | Freq: Every day | ORAL | Status: DC
  Administered 2019-01-21 – 2019-01-22 (×2): 1 via ORAL
  Filled 2019-01-21 (×2): qty 1

## 2019-01-21 MED ORDER — ALUM & MAG HYDROXIDE-SIMETH 200-200-20 MG/5ML PO SUSP: 30.0000 mL | Freq: Four times a day (QID) | ORAL | Status: DC | PRN

## 2019-01-21 MED ORDER — MAGIC MOUTHWASH
15.0000 mL | Freq: Four times a day (QID) | ORAL | Status: DC | PRN
  Filled 2019-01-21: qty 15

## 2019-01-21 MED ORDER — HYDROMORPHONE HCL 1 MG/ML IJ SOLN: 0.5000 mg | INTRAMUSCULAR | Status: DC | PRN

## 2019-01-21 MED ORDER — PROCHLORPERAZINE EDISYLATE 10 MG/2ML IJ SOLN: 5.0000 mg | INTRAMUSCULAR | Status: DC | PRN

## 2019-01-21 MED ORDER — HYDROCORTISONE 1 % EX CREA
1.0000 "application " | TOPICAL_CREAM | Freq: Three times a day (TID) | CUTANEOUS | Status: DC | PRN
  Filled 2019-01-21: qty 28

## 2019-01-21 MED ORDER — GUAIFENESIN-DM 100-10 MG/5ML PO SYRP: 10.0000 mL | ORAL_SOLUTION | ORAL | Status: DC | PRN

## 2019-01-21 MED ORDER — HYDROCORTISONE (PERIANAL) 2.5 % EX CREA
1.0000 "application " | TOPICAL_CREAM | Freq: Four times a day (QID) | CUTANEOUS | Status: DC | PRN
  Filled 2019-01-21: qty 28.35

## 2019-01-21 MED ORDER — MENTHOL 3 MG MT LOZG: 1.0000 | LOZENGE | OROMUCOSAL | Status: DC | PRN

## 2019-01-21 MED ORDER — BISACODYL 10 MG RE SUPP
10.0000 mg | Freq: Every day | RECTAL | Status: DC
  Filled 2019-01-21 (×2): qty 1

## 2019-01-21 MED ORDER — GABAPENTIN 300 MG PO CAPS
300.0000 mg | ORAL_CAPSULE | Freq: Two times a day (BID) | ORAL | Status: DC
  Administered 2019-01-21 (×2): 300 mg via ORAL
  Filled 2019-01-21 (×2): qty 1

## 2019-01-21 MED ORDER — LACTATED RINGERS IV BOLUS: 1000.0000 mL | Freq: Three times a day (TID) | INTRAVENOUS | Status: AC | PRN

## 2019-01-21 MED ORDER — SODIUM CHLORIDE 0.9% FLUSH: 3.0000 mL | INTRAVENOUS | Status: DC | PRN

## 2019-01-21 MED ORDER — EZETIMIBE 10 MG PO TABS
10.0000 mg | ORAL_TABLET | Freq: Every day | ORAL | Status: DC
  Administered 2019-01-21 – 2019-01-22 (×2): 10 mg via ORAL
  Filled 2019-01-21 (×2): qty 1

## 2019-01-21 MED ORDER — LIP MEDEX EX OINT
1.0000 "application " | TOPICAL_OINTMENT | Freq: Two times a day (BID) | CUTANEOUS | Status: DC
  Administered 2019-01-21 – 2019-01-22 (×3): 1 via TOPICAL

## 2019-01-21 MED ORDER — SODIUM CHLORIDE 0.9% FLUSH
3.0000 mL | Freq: Two times a day (BID) | INTRAVENOUS | Status: DC
  Administered 2019-01-21 – 2019-01-22 (×2): 3 mL via INTRAVENOUS

## 2019-01-21 MED ORDER — PANTOPRAZOLE SODIUM 40 MG PO TBEC
80.0000 mg | DELAYED_RELEASE_TABLET | Freq: Every day | ORAL | Status: DC
  Administered 2019-01-21 – 2019-01-22 (×2): 80 mg via ORAL
  Filled 2019-01-21 (×2): qty 2

## 2019-01-21 MED ORDER — METOPROLOL TARTRATE 5 MG/5ML IV SOLN: 5.0000 mg | Freq: Four times a day (QID) | INTRAVENOUS | Status: DC | PRN

## 2019-01-21 MED ORDER — PHENOL 1.4 % MT LIQD
1.0000 | OROMUCOSAL | Status: DC | PRN
  Filled 2019-01-21: qty 177

## 2019-01-21 MED ORDER — HYDRALAZINE HCL 20 MG/ML IJ SOLN: 5.0000 mg | INTRAMUSCULAR | Status: DC | PRN

## 2019-01-21 MED ORDER — FUROSEMIDE 10 MG/ML IJ SOLN
40.0000 mg | Freq: Once | INTRAMUSCULAR | Status: AC
  Administered 2019-01-21: 40 mg via INTRAVENOUS
  Filled 2019-01-21: qty 4

## 2019-01-21 MED ORDER — LORAZEPAM 2 MG/ML IJ SOLN: 0.5000 mg | Freq: Three times a day (TID) | INTRAMUSCULAR | Status: DC | PRN

## 2019-01-21 MED ORDER — ACETAMINOPHEN 500 MG PO TABS
1000.0000 mg | ORAL_TABLET | Freq: Three times a day (TID) | ORAL | Status: DC
  Administered 2019-01-21 (×3): 1000 mg via ORAL
  Filled 2019-01-21 (×3): qty 2

## 2019-01-21 MED ORDER — SODIUM CHLORIDE 0.9 % IV SOLN: 250.0000 mL | INTRAVENOUS | Status: DC | PRN

## 2019-01-21 NOTE — Progress Notes (Addendum)
[facility] Office:  [phone] General Surgery Progress Note   LOS: 4 days  POD -  4 Days Post-Op  Chief Complaint: Colon cancer  Assessment and Plan: 1.  LAPAROSCOPIC assisted RIGHT HEMI COLECTOMY - 01/17/2019 - D. Newman  Pathology - 3.3 cm adenoca, grade 2, 2/18 nodes POSITIVE (T3, N1b)  On Entereg Still with ileus.  Gentle bowel regimen.  Try and get her mobilized. Improved pain control to help her mobilize. Anxiolysis. Stop IV fluids with PRN backup.  Lasix x1 since +5 L and risk of ileus    2. CAD s/p NSTEMI           Cardiac catheterization - 02/26/2016 - with DES             She has been followed by Dr. Martinique 3. HTN -continue amlodipine.  Diuresis x1 dose.  PRN backup control 4. Hyperlipidemia 5. CVA - 1996  Some minimal left leg weakness 6. Obese - BMI - 37 7. DVT prophylaxis - Lovenox 8.  GERD.  Try and get her out of bed and sitting up more.  Walking more.  Increase Protonix.  Maalox as needed.  Simethicone as needed.   Active Problems:   Cancer of right colon - resected 01/17/2019  Subjective:  Complaining of heartburn.  Laying flat in bed.  Afraid to try more than just liquids.  Only walked once.  Some burping.  Some flatus.  No bowel movement.  Objective:   Vitals:   01/20/19 2058 01/21/19 0624  BP: 139/77 (!) 154/75  Pulse: (!) 111 87  Resp: 18 16  Temp: 98.7 F (37.1 C) 98.7 F (37.1 C)  SpO2: 97% 98%     Intake/Output from previous day:  08/14 0701 - 08/15 0700 In: 1810.3 [P.O.:720; I.V.:1090.3] Out: 1000 [Urine:1000]  Intake/Output this shift:  No intake/output data recorded.   Physical Exam: General: Pt awake/alert/oriented x4 in no major acute distress Eyes: PERRL, normal EOM. Sclera nonicteric Neuro: CN II-XII intact w/o focal sensory/motor deficits. Lymph: No head/neck/groin lymphadenopathy Psych:  No delerium/psychosis/paranoia.  Mildly anxious but consolable. HENT: Normocephalic, Mucus membranes  moist.  No thrush Neck: Supple, No tracheal deviation Chest: No pain.  Good respiratory excursion. CV:  Pulses intact.  Regular rhythm MS: Normal AROM mjr joints.  No obvious deformity  Abdomen: Obese but soft.  Mild discomfort at extraction incision.  Incisions clean dry and intact.  No abscess or cellulitis.  Mildly distended  Ext:  SCDs BLE.  No significant edema.  No cyanosis Skin: No petechiae / purpura     Lab Results:    Recent Labs    01/19/19 0335 01/20/19 0336  WBC 12.0* 10.1  HGB 11.4* 11.8*  HCT 35.7* 38.7  PLT 289 312    BMET   Recent Labs    01/19/19 0335 01/20/19 0336  NA 135 134*  K 4.3 4.3  CL 104 101  CO2 24 25  GLUCOSE 126* 123*  BUN 9 7*  CREATININE 1.05* 1.07*  CALCIUM 9.0 9.0    PT/INR  No results for input(s): LABPROT, INR in the last 72 hours.  ABG  No results for input(s): PHART, HCO3 in the last 72 hours.  Invalid input(s): PCO2, PO2   Studies/Results:  No results found.   Anti-infectives:   Anti-infectives (From admission, onward)   Start     Dose/Rate Route Frequency Ordered Stop   01/17/19 0600  cefoTEtan (CEFOTAN) 2 g in sodium chloride 0.9 % 100 mL IVPB  2 g 200 mL/hr over 30 Minutes Intravenous On call to O.R. 01/17/19 0547 01/17/19 1240      Adin Hector, MD, FACS, MASCRS Gastrointestinal and Minimally Invasive Surgery    [address] [place], [place] [address] [phone] Main / Paging [phone] Fax   01/21/2019

## 2019-01-22 ENCOUNTER — Encounter (HOSPITAL_COMMUNITY): Payer: Self-pay | Admitting: Surgery

## 2019-01-22 DIAGNOSIS — R112 Nausea with vomiting, unspecified: Secondary | ICD-10-CM | POA: Diagnosis present

## 2019-01-22 DIAGNOSIS — D509 Iron deficiency anemia, unspecified: Secondary | ICD-10-CM | POA: Diagnosis present

## 2019-01-22 DIAGNOSIS — I10 Essential (primary) hypertension: Secondary | ICD-10-CM | POA: Diagnosis present

## 2019-01-22 DIAGNOSIS — Z8679 Personal history of other diseases of the circulatory system: Secondary | ICD-10-CM

## 2019-01-22 DIAGNOSIS — E785 Hyperlipidemia, unspecified: Secondary | ICD-10-CM | POA: Diagnosis present

## 2019-01-22 DIAGNOSIS — K219 Gastro-esophageal reflux disease without esophagitis: Secondary | ICD-10-CM | POA: Diagnosis present

## 2019-01-22 DIAGNOSIS — I252 Old myocardial infarction: Secondary | ICD-10-CM

## 2019-01-22 MED ORDER — ACETAMINOPHEN 500 MG PO TABS
1000.0000 mg | ORAL_TABLET | Freq: Four times a day (QID) | ORAL | Status: DC
  Administered 2019-01-22 – 2019-01-23 (×3): 1000 mg via ORAL
  Filled 2019-01-22 (×3): qty 2

## 2019-01-22 MED ORDER — GABAPENTIN 300 MG PO CAPS
300.0000 mg | ORAL_CAPSULE | Freq: Three times a day (TID) | ORAL | Status: DC
  Administered 2019-01-22 (×3): 300 mg via ORAL
  Filled 2019-01-22 (×3): qty 1

## 2019-01-22 NOTE — Progress Notes (Signed)
[facility] Office:  [phone] General Surgery Progress Note   LOS: 5 days  POD -  5 Days Post-Op  Chief Complaint: Colon cancer  Assessment and Plan: 1.  LAPAROSCOPIC assisted RIGHT HEMI COLECTOMY - 01/17/2019 - D. Newman  Pathology - 3.3 cm adenoca, grade 2, 2/18 nodes POSITIVE (T3, N1b)  most likely would benefit from post adjuvant chemotherapy.   Most likely should discuss on tumor board.   Deferred to operating surgeon, Dr. Lucia Gaskins.   Postop Ileus resolving.  On Entereg Advanced to dysphagia 1/full liquids.  Soft diet tonight Gentle bowel regimen.  Try and get her mobilized. Improved pain control to help her mobilize. Anxiolysis. Stop IV fluids with PRN backup.  Lasix x1 done since +5 L and risk of ileus    2. CAD s/p NSTEMI           Cardiac catheterization - 02/26/2016 - with DES             She has been followed by Dr. Martinique 3. HTN -continue amlodipine.  Diuresis x1 dose.  PRN backup control 4. Hyperlipidemia 5. CVA - 1996  Some minimal left leg weakness 6. Obese - BMI - 37 7. DVT prophylaxis - Lovenox 8.  GERD.  Less complaints today.  Try and get her out of bed and sitting up more.  Walking more.  Increased Protonix.  Maalox as needed.  Simethicone as needed.   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  Had flatus and bowel movement.  Feels better overall.  Objective:   Vitals:   01/21/19 2203 01/22/19 0603  BP: 124/78 115/66  Pulse: 94 86  Resp: 20 18  Temp: 98.3 F (36.8 C) 98.4 F (36.9 C)  SpO2: 96% 97%     Intake/Output from previous day:  08/15 0701 - 08/16  0700 In: 1997.3 [P.O.:720; I.V.:1277.3] Out: 2600 [Urine:2600]  Intake/Output this shift:  No intake/output data recorded.   Physical Exam: General: Pt awake/alert/oriented x4 in no major acute distress Eyes: PERRL, normal EOM. Sclera nonicteric Neuro: CN II-XII intact w/o focal sensory/motor deficits. Lymph: No head/neck/groin lymphadenopathy Psych:  No delerium/psychosis/paranoia.  Mildly anxious but consolable. HENT: Normocephalic, Mucus membranes moist.  No thrush Neck: Supple, No tracheal deviation Chest: No pain.  Good respiratory excursion. CV:  Pulses intact.  Regular rhythm MS: Normal AROM mjr joints.  No obvious deformity  Abdomen: Obese but soft.  Nondistended.  Mild discomfort at extraction incision.  Incisions clean dry and intact.  No abscess or cellulitis.   Ext:  SCDs BLE.  No significant edema.  No cyanosis Skin: No petechiae / purpura     Lab Results:    Recent Labs    01/20/19 0336  WBC 10.1  HGB 11.8*  HCT 38.7  PLT 312    BMET   Recent Labs    01/20/19 0336  NA 134*  K 4.3  CL 101  CO2 25  GLUCOSE 123*  BUN 7*  CREATININE 1.07*  CALCIUM 9.0    PT/INR  No results for input(s): LABPROT, INR in the last 72 hours.  ABG  No results for input(s):  PHART, HCO3 in the last 72 hours.  Invalid input(s): PCO2, PO2   Studies/Results:  No results found.   Anti-infectives:   Anti-infectives (From admission, onward)   Start     Dose/Rate Route Frequency Ordered Stop   01/17/19 0600  cefoTEtan (CEFOTAN) 2 g in sodium chloride 0.9 % 100 mL IVPB     2 g 200 mL/hr over 30 Minutes Intravenous On call to O.R. 01/17/19 0547 01/17/19 1240     FINAL DIAGNOSIS Diagnosis  - SEE ONCOLOGY TABLE. 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 - TUMOR INVADES THROUGH THE MUSCULARIS PROPRIA INTO PERICOLONIC TISSUES. - MARGINS OF RESECTION ARE NOT INVOLVED. -  METASTATIC CARCINOMA PRESENT IN (2) OF (18) LYMPH NODES., Including Transanal Disk Excision of Rectal Neoplasms Procedure: Right hemicolectomy Tumor Site: Ileocecal valve Tumor Size: 3.3 cm Macroscopic Tumor Perforation: Not identified Histologic Type: Adenocarcinoma Histologic Grade: G2: Moderately differentiated Tumor Extension: Tumor invades through the muscularis propria into pericolonic tissues Margins: All margins are uninvolved by invasive carcinoma, intramucosal adenocarcinoma, high-grade dysplasia and adenoma Treatment Effect: No known presurgical therapy Lymphovascular Invasion: Present Perineural Invasion: Not identified Tumor Deposits: Not identified Regional Lymph Nodes: Number of Lymph Nodes Involved: 2 Number of Lymph Nodes Examined: 18 Pathologic Stage Classification (pTNM, AJCC 8th Edition): pT3, pN1b Ancillary Studies: MMR/MSI testing will be reported separately Representative Tumor Block: 1-E Comment: Additional pathologic findings include tubular adenomata (2). (v4.0.1.0) 1 of 4 Supplemental copy SUPPLEMENTAL for CORENA, TILSON MURCHIS ([id number]) Gillie Manners MD Pathologist, Electronic Signature (Case signed 01/19/2019) Specimen Johanan Skorupski and Clinical Information Specimen(s) Obtained: Colon, segmental resection for tumor, right Specimen Clinical Information cecal colon cancer (kp) Luvia Orzechowski Specimen: Terminal ileum, cecum, appendix and ascending colon. Specimen integrity: Intact. Specimen length: The terminal ileum is 3.5 cm in length, the cecum is 6 x 5 x 4 cm and the ascending colon is 7 cm in length. The appendix is 6.4 cm in length x 1.2 cm in diameter. Mesorectal intactness: Not applicable. Tumor location: A fungating mass almost entirely replaces the ileocecal valve. Tumor size: 3.3 x 3.2 cm. Percent of bowel circumference involved: 95% of the ileocecal valve. Tumor distance to margins: Proximal: 4 cm. Distal: 7.5 cm. Mesenteric (sigmoid and  transverse): Not applicable. Radial (posterior ascending; posterior descending; lateral and posterior mid-rectum, and entire lower 1/3 rectum): Greater than 2 cm. Macroscopic extent of tumor invasion: Tumor invades through the muscularis propria into pericolonic tissues and approximates the serosal surface. Total presumed lymph nodes: Eighteen possible lymph nodes ranging from 0.2 to 2.5 cm. The largest lymph node is grossly involved by tumor. Extramural satellite tumor nodules: Not identified. Mucosal polyp(s): A 0.5 cm sessile polyp is present 0.5 cm distal to the mass. A 1.0 cm pedunculated polyp is present 3 cm distal to the mass. Additional findings: The appendix is sectioned to reveal a wall thickness of 0.3 cm and a 1 cm disruption at the tip. The lumen is pinpoint to 0.2 cm and without contents. Block summary: A = proximal margin en face. B = distal margin en face. C = mass with terminal ileum. D = mass invading soft tissue. E-G = mass to serosal surface, surface inked black. H = one polyp whole. I = one polyp bisected. J = appendix. K = one lymph node whole. L = four lymph nodes whole. M = three lymph nodes whole. 2 of 4 Supplemental copy SUPPLEMENTAL for Liaw, Denyce MURCHIS ([id number]) Doren Kaspar(continued) N = three lymph  nodes whole. O = one grossly positive lymph node, representatively sectioned. P = two lymph nodes bisected, one inked black. Q = two lymph nodes bisected, one inked black R = two lymph nodes bisected, one inked black. (AK:kh 01/18/2019) Disclaimer Some of these immunohistochemical stains may have been developed and the performance characteristics determined by [facility]. Some may not have been cleared or approved by the U.S. Food and Drug Administration. The FDA has determined that such clearance or approval is not necessary. This test is used for clinical purposes. It should not be regarded as investigational or for research. This  laboratory is certified under the [place] (CLIA-88) as qualified to perform high complexity clinical laboratory testing. Report signed out from the following location(s) Technical Component was performed at [facility] [place], [place], [place] [address]. CLIA #: [id number], Interpretation was performed at [facility] [place], [place], [place] [address]. CLIA #: [id number],   Adin Hector, MD, FACS, MASCRS Gastrointestinal and Minimally Invasive Surgery    [address], [place], [place] [address] [phone] Main / Paging [phone] Fax   01/22/2019

## 2019-01-22 NOTE — Progress Notes (Signed)
Pharmacy Brief Note - Alvimopan (Entereg)  The standing order set for alvimopan (Entereg) now includes an automatic order to discontinue the drug after the patient has had a bowel movement. The change was approved by the Pharmacy & Therapeutics Committee and the Medical Executive Committee.  This patient has had a bowel movement documented by nursing. Therefore, alvimopan has been discontinued. If there are questions, please contact the pharmacy at [phone].  Thank you    

## 2019-01-23 NOTE — Progress Notes (Signed)
[facility] Office:  [phone] General Surgery Progress Note   LOS: 6 days  POD -  6 Days Post-Op  Chief Complaint: Colon cancer  Assessment and Plan: 1.  LAPAROSCOPIC assisted RIGHT HEMI COLECTOMY - 01/17/2019 - D. Ailin Rochford  Pathology - 3.3 cm adenoca, grade 2, 2/18 nodes POSITIVE (T3, N1b)  On Entereg  Tolerating food.  Had a BM.  Ready to go home.  2. CAD s/p NSTEMI           Cardiac catheterization - 02/26/2016 - with DES             She has been followed by Dr. Martinique 3. HTN 4. Hyperlipidemia 5. CVA - 1996  Some minimal left leg weakness 6. Obese - BMI - 37 7. 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  Having BM's.  Ready to go home  Husband  looked me up on Facebook.  Her husband knows Gretta Cool - he did surgery on him.  Objective:   Vitals:   01/22/19 2151 01/23/19 0456  BP: 118/65 138/73  Pulse: 77 76  Resp: 15 18  Temp: 98.1 F (36.7 C) 97.6 F (36.4 C)  SpO2: 96% 97%     Intake/Output from previous day:  08/16 0701 - 08/17 0700 In: 880 [P.O.:880] Out: 1050 [Urine:1050]  Intake/Output this shift:  No intake/output data recorded.   Physical Exam:   General: Obese WF who is alert and oriented.    HEENT: Normal. Pupils equal. .   Lungs: Clear       Abdomen: Has BS.  Soft   Wound: Clean   Lab Results:    No results for input(s): WBC, HGB, HCT, PLT in the last 72 hours.  BMET   No results for input(s): NA, K, CL, CO2, GLUCOSE, BUN, CREATININE, CALCIUM in the last 72 hours.  PT/INR  No results for input(s): LABPROT, INR in the  last 72 hours.  ABG  No results for input(s): PHART, HCO3 in the last 72 hours.  Invalid input(s): PCO2, PO2   Studies/Results:  No results found.   Anti-infectives:   Anti-infectives (From admission, onward)   Start     Dose/Rate Route Frequency Ordered Stop   01/17/19 0600  cefoTEtan (CEFOTAN) 2 g in sodium chloride 0.9 % 100 mL IVPB     2 g 200 mL/hr over 30 Minutes Intravenous On call to O.R. 01/17/19 0547 01/17/19 1240      Alphonsa Overall, MD, FACS Pager: [facility] Office: [phone] 01/23/2019

## 2019-01-23 NOTE — Discharge Summary (Signed)
Physician Discharge Summary  Patient ID:  [name]  MRN: [id number]  DOB/AGE: Oct 14, 1945 73 y.o.  Admit date: 01/17/2019 Discharge date: 01/23/2019  Discharge Diagnoses:  1.  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 CAD s/p NSTEMI Cardiac catheterization - 02/26/2016- with DES She has been followed by Dr. Martinique 3. HTN 4. Hyperlipidemia 5. CVA - 1996        Some minimal left leg weakness 6. 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  Discharged Condition: good  Hospital Course: [name] is an 73 y.o. female whose primary care physician is Chesley Noon, MD and who was admitted 01/17/2019 with a chief complaint of cecal carcinoma.   She was brought to the operating room on 01/17/2019 and underwent LAPAROSCOPIC Assisted RIGHT HEMI COLECTOMY.   Post op, she had some distention and nausea early after surgery.  But on the 5th post op day, she started having BM's.   She is now tolerating a diet.  She has not had any narcotic pain med for 3 days, so I will not send her home with any. She is ready to go home. The discharge instructions were reviewed with the patient.  Consults: None  Significant Diagnostic Studies: Results for orders placed or performed during the hospital encounter of 00/76/22  Basic metabolic panel  Result Value Ref Range   Sodium 135 135 - 145 mmol/L   Potassium 3.9 3.5 - 5.1 mmol/L   Chloride 100 98 - 111 mmol/L   CO2 26 22 - 32 mmol/L   Glucose, Bld 151 (H) 70 - 99 mg/dL   BUN 14 8 - 23 mg/dL   Creatinine, Ser 1.17 (H) 0.44 - 1.00 mg/dL   Calcium 9.3 8.9 - 10.3 mg/dL   GFR calc non Af Amer 46 (L) >60 mL/min   GFR calc Af Amer 54 (L) >60 mL/min   Anion gap 9 5 - 15  CBC  Result Value Ref Range   WBC 14.3 (H) 4.0 - 10.5 K/uL   RBC 4.16 3.87 - 5.11 MIL/uL   Hemoglobin 11.3 (L) 12.0 - 15.0 g/dL   HCT 35.5 (L) 36.0 - 46.0 %   MCV 85.3 80.0 - 100.0 fL   MCH 27.2 26.0 - 34.0 pg   MCHC 31.8 30.0 - 36.0 g/dL   RDW 20.2 (H) 11.5 - 15.5 %   Platelets 311 150 - 400 K/uL   nRBC 0.0 0.0 - 0.2 %  Basic metabolic panel  Result Value Ref Range   Sodium 135 135 - 145 mmol/L   Potassium 4.3 3.5 - 5.1 mmol/L   Chloride 104 98 - 111 mmol/L   CO2 24 22 - 32 mmol/L   Glucose, Bld 126 (H) 70 - 99 mg/dL   BUN 9 8 - 23 mg/dL   Creatinine, Ser 1.05 (H) 0.44 - 1.00 mg/dL   Calcium 9.0 8.9 - 10.3 mg/dL   GFR calc non Af Amer 53 (L) >60 mL/min   GFR calc Af Amer >  60 >60 mL/min   Anion gap 7 5 - 15  CBC with Differential/Platelet  Result Value Ref Range   WBC 12.0 (H) 4.0 - 10.5 K/uL   RBC 4.22 3.87 - 5.11 MIL/uL   Hemoglobin 11.4 (L) 12.0 - 15.0 g/dL   HCT 35.7 (L) 36.0 - 46.0 %   MCV 84.6 80.0 - 100.0 fL   MCH 27.0 26.0 - 34.0 pg   MCHC 31.9 30.0 - 36.0 g/dL   RDW 20.7 (H) 11.5 - 15.5 %   Platelets 289 150 - 400 K/uL   nRBC 0.0 0.0 - 0.2 %   Neutrophils Relative % 74 %   Neutro Abs 8.8 (H) 1.7 - 7.7 K/uL   Lymphocytes Relative 19 %   Lymphs Abs 2.2 0.7 - 4.0 K/uL   Monocytes Relative 7 %   Monocytes Absolute 0.9 0.1 - 1.0 K/uL   Eosinophils Relative 0 %   Eosinophils Absolute 0.0 0.0 - 0.5 K/uL   Basophils Relative 0 %   Basophils Absolute 0.0 0.0 - 0.1 K/uL   Immature Granulocytes 0 %   Abs Immature Granulocytes 0.05 0.00 - 0.07 K/uL  CBC with Differential/Platelet  Result Value Ref Range   WBC 10.1 4.0 - 10.5 K/uL   RBC 4.47 3.87 - 5.11 MIL/uL   Hemoglobin 11.8 (L) 12.0 - 15.0 g/dL   HCT  38.7 36.0 - 46.0 %   MCV 86.6 80.0 - 100.0 fL   MCH 26.4 26.0 - 34.0 pg   MCHC 30.5 30.0 - 36.0 g/dL   RDW 20.6 (H) 11.5 - 15.5 %   Platelets 312 150 - 400 K/uL   nRBC 0.0 0.0 - 0.2 %   Neutrophils Relative % 67 %   Neutro Abs 6.7 1.7 - 7.7 K/uL   Lymphocytes Relative 22 %   Lymphs Abs 2.2 0.7 - 4.0 K/uL   Monocytes Relative 8 %   Monocytes Absolute 0.9 0.1 - 1.0 K/uL   Eosinophils Relative 2 %   Eosinophils Absolute 0.2 0.0 - 0.5 K/uL   Basophils Relative 1 %   Basophils Absolute 0.1 0.0 - 0.1 K/uL   Immature Granulocytes 0 %   Abs Immature Granulocytes 0.04 0.00 - 0.07 K/uL  Basic metabolic panel  Result Value Ref Range   Sodium 134 (L) 135 - 145 mmol/L   Potassium 4.3 3.5 - 5.1 mmol/L   Chloride 101 98 - 111 mmol/L   CO2 25 22 - 32 mmol/L   Glucose, Bld 123 (H) 70 - 99 mg/dL   BUN 7 (L) 8 - 23 mg/dL   Creatinine, Ser 1.07 (H) 0.44 - 1.00 mg/dL   Calcium 9.0 8.9 - 10.3 mg/dL   GFR calc non Af Amer 51 (L) >60 mL/min   GFR calc Af Amer 60 (L) >60 mL/min   Anion gap 8 5 - 15    No results found.  Discharge Exam:  Vitals:   01/22/19 2151 01/23/19 0456  BP: 118/65 138/73  Pulse: 77 76  Resp: 15 18  Temp: 98.1 F (36.7 C) 97.6 F (36.4 C)  SpO2: 96% 97%    General: Obese WF who is alert and generally healthy appearing.  Lungs: Clear to auscultation and symmetric breath sounds. Heart:  RRR. No murmur or rub. Abdomen: Soft. No mass. No hernia. Normal bowel sounds. Incisions look good.  Discharge Medications:   Allergies as of 01/23/2019      Reactions   Guaifenesin Er Palpitations   Lotensin [benazepril Hcl] Other (See  Comments)   Took off when patient had stroke   Propoxyphene Palpitations      Medication List    TAKE these medications   amLODipine 5 MG tablet Commonly known as: NORVASC Take 5 mg by mouth daily.   ezetimibe 10 MG tablet Commonly known as: ZETIA Take 1 tablet by mouth once daily   hydrochlorothiazide 25 MG tablet Commonly known as:  HYDRODIURIL Take 25 mg by mouth daily.   IRON PO Take 43 mg by mouth daily.   losartan 100 MG tablet Commonly known as: COZAAR Take 100 mg by mouth daily.   omeprazole 40 MG capsule Commonly known as: PRILOSEC Take 40 mg by mouth daily.   pantoprazole 40 MG tablet Commonly known as: PROTONIX Take 1 tablet (40 mg total) by mouth daily.       Disposition: Discharge disposition: 01-Home or Self Care       Discharge Instructions    Diet - low sodium heart healthy   Complete by: As directed    Increase activity slowly   Complete by: As directed          Signed: Alphonsa Overall, M.D., [facility] Surgery Office:  [phone]  01/23/2019, 9:03 AM

## 2019-01-23 NOTE — Plan of Care (Signed)
Patient discharge to home and transported via wheelchair. All questions were answered.

## 2019-01-23 NOTE — Discharge Instructions (Signed)
[facility] - DISCHARGE INSTRUCTIONS TO PATIENT  Activity:  Driving - May drive in 5 to 7 days.   Lifting - No lifting more than 15 pounds for 10 days, then no limit                       Practice you Covid-19 protection:  Wear a mask, social distance, and wash your hands frequently  Wound Care:   You may shower  Diet:  As tolerated  Follow up appointment:  Call Dr. Pollie Friar office [facility]) at [phone] for an appointment in 2 weeks.        We will contact a medical oncologist for you to see.  Medications and dosages:  Resume your home medications.  Call Dr. Lucia Gaskins or his office  [phone]) if you have:  Temperature greater than 100.4,  Persistent nausea and vomiting,  Redness, tenderness, or signs of infection (pain, swelling, redness, odor or green/yellow discharge around the site),  Difficulty breathing, headache or visual disturbances,  Any other questions or concerns you may have after discharge.  In an emergency, call 911 or go to an Emergency Department at a nearby hospital.

## 2019-02-06 ENCOUNTER — Telehealth: Payer: Self-pay

## 2019-02-06 NOTE — Telephone Encounter (Signed)
Called and spoke with Mr. Straube to advise of Mrs. Givans's initial consult with Ned Card NP/ Dr. Benay Spice on 9/1 @ 1:45 PM.  Introduced role of GI Navigator and provided my direct contact number. Mr and Mrs Rhim have an adult daughter that lives with them. Visitor restrictions explained.

## 2019-02-07 ENCOUNTER — Telehealth: Payer: Self-pay | Admitting: Oncology

## 2019-02-07 ENCOUNTER — Encounter: Payer: Self-pay | Admitting: Nurse Practitioner

## 2019-02-07 ENCOUNTER — Inpatient Hospital Stay: Payer: Medicare Other | Attending: Nurse Practitioner | Admitting: Nurse Practitioner

## 2019-02-07 ENCOUNTER — Other Ambulatory Visit: Payer: Self-pay

## 2019-02-07 VITALS — BP 133/92 | HR 97 | Temp 98.9°F | Resp 17 | Ht 61.0 in | Wt 188.2 lb

## 2019-02-07 DIAGNOSIS — D509 Iron deficiency anemia, unspecified: Secondary | ICD-10-CM | POA: Diagnosis not present

## 2019-02-07 DIAGNOSIS — I10 Essential (primary) hypertension: Secondary | ICD-10-CM | POA: Insufficient documentation

## 2019-02-07 DIAGNOSIS — C182 Malignant neoplasm of ascending colon: Secondary | ICD-10-CM

## 2019-02-07 DIAGNOSIS — Z23 Encounter for immunization: Secondary | ICD-10-CM | POA: Diagnosis not present

## 2019-02-07 DIAGNOSIS — I252 Old myocardial infarction: Secondary | ICD-10-CM | POA: Insufficient documentation

## 2019-02-07 DIAGNOSIS — Z8673 Personal history of transient ischemic attack (TIA), and cerebral infarction without residual deficits: Secondary | ICD-10-CM | POA: Diagnosis not present

## 2019-02-07 DIAGNOSIS — C180 Malignant neoplasm of cecum: Secondary | ICD-10-CM | POA: Insufficient documentation

## 2019-02-07 DIAGNOSIS — R599 Enlarged lymph nodes, unspecified: Secondary | ICD-10-CM | POA: Diagnosis not present

## 2019-02-07 DIAGNOSIS — Z79899 Other long term (current) drug therapy: Secondary | ICD-10-CM | POA: Insufficient documentation

## 2019-02-07 MED ORDER — LIDOCAINE-PRILOCAINE 2.5-2.5 % EX CREA: TOPICAL_CREAM | CUTANEOUS | 2 refills | Status: DC

## 2019-02-07 MED ORDER — PROCHLORPERAZINE MALEATE 10 MG PO TABS: 10.0000 mg | ORAL_TABLET | Freq: Four times a day (QID) | ORAL | 2 refills | Status: DC | PRN

## 2019-02-07 NOTE — Telephone Encounter (Signed)
Scheduled per 09/01 los, patient receive avs and calender. Patient will be called with any changes to her appointments.

## 2019-02-07 NOTE — Progress Notes (Addendum)
New Hematology/Oncology Consult   Requesting MD: Dr. Alphonsa Overall  [phone]    Reason for Consult: Colon cancer  HPI: [name] is a 73 year old woman who underwent a colonoscopy 11/24/2018 after she was found to have iron deficiency anemia.  A large 5 cm malignant appearing fungating mass encompassing the ileocecal valve was identified.  Pathology showed invasive adenocarcinoma, moderately differentiated.  CT abdomen/pelvis on 12/07/2018 showed eccentric wall thickening in the cecum, around the ileocecal valve, compatible with findings reported at the recent colonoscopy.  Borderline enlarged adjacent lymph nodes in the ileocolic mesentery.  No evidence for liver metastases.  On 01/17/2019 she underwent a right hemicolectomy by Dr. Lucia Gaskins.  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     Past Medical History:  Diagnosis Date  . Cancer of right colon ([place])   . Coronary artery disease   . CVA (cerebral infarction) 1996  . Dyspnea    going up stairs and hills  . GERD (gastroesophageal reflux disease)   . History of cardiomegaly   . Hyperlipidemia   . Hypertension   . Iron deficiency anemia   . Myocardial infarction ([place]) 2017  . NSTEMI (non-ST elevated myocardial infarction) ([place])   . PONV (postoperative nausea and vomiting)   . Right shoulder pain    more painful at night , cold makes it worse  . Severe obesity ([place])   :   Past Surgical History:  Procedure Laterality Date  . ABDOMINAL HYSTERECTOMY    . BACK SURGERY     x2  . CARDIAC CATHETERIZATION N/A 02/28/2016   Procedure: Left Heart Cath and Coronary Angiography;  Surgeon: Peter M Martinique, MD;  Location: [facility];  Service:  Cardiovascular;  Laterality: N/A;  . CARDIAC CATHETERIZATION N/A 02/28/2016   Procedure: Coronary Stent Intervention;  Surgeon: Peter M Martinique, MD;  Location: [facility];  Service: Cardiovascular;  Laterality: N/A;  . CHOLECYSTECTOMY    . COLONOSCOPY  11/24/2018  . CORONARY ANGIOPLASTY    . CYST EXCISION Left    Cheek  . LAPAROSCOPIC RIGHT HEMI COLECTOMY Right 01/17/2019   Procedure: LAPAROSCOPIC RIGHT HEMI COLECTOMY;  Surgeon: Alphonsa Overall, MD;  Location: [facility];  Service: General;  Laterality: Right;  . NASAL FRACTURE SURGERY    . UPPER GI ENDOSCOPY  11/24/2018  :   Current Outpatient Medications:  .  amLODipine (NORVASC) 5 MG tablet, Take 5 mg by mouth daily., Disp: , Rfl:  .  ezetimibe (ZETIA) 10 MG tablet, Take 1 tablet by mouth once daily (Patient taking differently: Take 10 mg by mouth daily. ), Disp: 90 tablet, Rfl: 0 .  FERROUS SULFATE DRIED PO, Take 43 mg by mouth daily., Disp: , Rfl:  .  hydrochlorothiazide (HYDRODIURIL) 25 MG tablet, Take 25 mg by mouth daily., Disp: , Rfl:  .  losartan (COZAAR) 100 MG tablet, Take 100 mg by mouth daily., Disp: , Rfl:  .  omeprazole (PRILOSEC) 40 MG capsule, Take 40 mg by mouth daily., Disp: , Rfl:  .  pantoprazole (PROTONIX) 40 MG tablet, Take 1 tablet (40 mg total) by mouth daily., Disp: 90 tablet, Rfl: 3:  :   Allergies  Allergen Reactions  . Guaifenesin Er Palpitations  . Lotensin [Benazepril Hcl] Other (See Comments)    Took off  when patient had stroke  . Propoxyphene Palpitations  :  FH: Brother deceased with lung cancer (smoker); sister deceased with esophagus cancer (non-smoker); sister with colon cancer  SOCIAL HISTORY: She lives in [place].  She is married.  She has a daughter and granddaughter.  She is retired from the city of [place].  No tobacco or alcohol use.  Review of Systems: No fevers or sweats.  She had weight loss of a few pounds before surgery.  Appetite is beginning to improve.   She denies bleeding.  No unusual headaches or vision change.  No shortness of breath or cough.  No chest pain.  She had no change in bowel habits prior to the colon cancer diagnosis.  Bowels moving regularly since surgery.  She denies dysphagia.  No urinary symptoms.  She had some numbness in 3 fingers last week.  This has resolved.  Physical Exam:  Blood pressure (!) 133/92, pulse 97, temperature 98.9 F (37.2 C), resp. rate 17, height 5' 1"  (1.549 m), weight 188 lb 3.2 oz (85.4 kg), SpO2 100 %.  HEENT: Neck without mass. Lungs: Lungs clear bilaterally. Cardiac: Regular rate and rhythm. Abdomen: Abdomen soft and nontender.  No hepatomegaly.  Healed surgical incision right abdomen. Vascular: No leg edema. Lymph nodes: No palpable cervical, supraclavicular, axillary or inguinal lymph nodes. Neurologic: Alert and oriented. Skin: No rash.  LABS:  01/13/2019 CEA 12.7  RADIOLOGY:  No results found.  Assessment and Plan:   1. Colon cancer (T3, N1b)  Colonoscopy 11/24/2018-large 5 cm malignant appearing fungating mass encompassing the ileocecal valve was identified.  Pathology showed invasive adenocarcinoma, moderately differentiated.    CT abdomen/pelvis 12/07/2018-eccentric wall thickening in the cecum, around the ileocecal valve, compatible with findings reported at the recent colonoscopy.  Borderline enlarged adjacent lymph nodes in the ileocolic mesentery.  No evidence for liver metastases.    CEA 01/13/2019-12.7  01/17/2019 right hemicolectomy by Dr. Lucia Gaskins.  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 2. Iron deficiency anemia 3. Hypertension 4. History of MI status post stent placement 5. History of stroke  23  [name] is a 73 year old woman recently diagnosed with stage III colon cancer.  Dr. Benay Spice reviewed the diagnosis, prognosis and treatment options with her at today's visit.  The recommendation is for adjuvant chemotherapy.  We discussed adjuvant Xeloda versus FOLFOX.  We discussed potential toxicities associated with chemotherapy including bone marrow toxicity, nausea, hair loss, allergic reaction.  We reviewed potential toxicities associated with Xeloda/infusional 5-FU including mouth sores, diarrhea, hand-foot syndrome, increased sensitivity to sun, skin rash.  We discussed the various forms of neuropathy associated with oxaliplatin including cold sensitivity, acute laryngopharyngeal dysesthesia, peripheral neuropathy. We discussed more rare occurrences such as jaw pain, diplopia, ataxia.  She decided to proceed with FOLFOX chemotherapy.  The plan is for her to receive a total of 6 cycles.  She will attend a chemotherapy education class.  We are referring her for a staging chest CT.  We are referring her to Dr. Lucia Gaskins for placement of a Port-A-Cath.  Prescriptions were sent to her pharmacy for Compazine and EMLA cream.  Her family history is notable for multiple family members with cancer.  We made a referral to the genetics clinic.  CEA prior to surgery was elevated.  We will repeat the CEA prior to her first cycle of chemotherapy.  She will return for lab, follow-up, cycle 1 FOLFOX on 02/23/2019.  She will contact the office in the interim with any problems.  Patient seen with Dr. Benay Spice.   Ned Card, NP 02/07/2019, 2:40 PM   This was a shared visit with Ned Card.  [name] was interviewed and examined.  She has been diagnosed with stage III colon cancer.  She has early stage III disease.  We discussed the [name].  Her husband was present by telephone for today's visit.  There is a significant chance of developing recurrent colon cancer over the next several years.  We  discussed data supporting the benefit of adjuvant 5-fluorouracil based chemotherapy in patients with resected stage III colon cancer.  We discussed single agent 5-fluorouracil and the expected benefit from the addition of oxaliplatin. We discussed the lack of clear evidence for any benefit with oxaliplatin in patients older than 70.  We discussed the small absolute survival benefit associated with oxaliplatin.  I reviewed data confirming noninferiority for 3 months versus 6 months of FOLFOX in early stage III patients.  We also discussed single agent capecitabine.  [name] would like to proceed with FOLFOX.  We reviewed potential toxicities associated with this regimen.  I will refer her to Dr. Lucia Gaskins for placement of cath.  She will attend a chemotherapy teaching class.  The plan is to begin adjuvant FOLFOX on 02/23/2019.  A chemotherapy plan was entered today.  Julieanne Manson, MD

## 2019-02-07 NOTE — Progress Notes (Signed)
START OFF PATHWAY REGIMEN - Colorectal   [id number]:FOLFOX (q14d) **2 cycles per order sheet**:   A cycle is every 14 days:     Oxaliplatin      Leucovorin      Fluorouracil      Fluorouracil   **Always confirm dose/schedule in your pharmacy ordering system**  Patient Characteristics: Postoperative without Neoadjuvant Therapy (Pathologic Staging), Colon, Stage III, Low Risk (pT1-3, pN1) Tumor Location: Colon Therapeutic Status: Postoperative without Neoadjuvant Therapy (Pathologic Staging) AJCC M Category: Staged < 8th Ed. AJCC T Category: Staged < 8th Ed. AJCC N Category: Staged < 8th Ed. AJCC 8 Stage Grouping: Staged < 8th Ed. Intent of Therapy: Curative Intent, Discussed with Patient

## 2019-02-08 ENCOUNTER — Encounter: Payer: Self-pay | Admitting: Oncology

## 2019-02-09 ENCOUNTER — Telehealth: Payer: Self-pay | Admitting: Oncology

## 2019-02-09 NOTE — Telephone Encounter (Signed)
Called and left msg about change in appts. Mailed printout

## 2019-02-13 ENCOUNTER — Other Ambulatory Visit (HOSPITAL_COMMUNITY): Payer: Self-pay | Admitting: Surgery

## 2019-02-17 ENCOUNTER — Ambulatory Visit (HOSPITAL_COMMUNITY)
Admission: RE | Admit: 2019-02-17 | Discharge: 2019-02-17 | Disposition: A | Discharge status: Home / Self Care | Payer: Medicare Other | Source: Ambulatory Visit | Attending: Nurse Practitioner | Admitting: Nurse Practitioner

## 2019-02-17 ENCOUNTER — Other Ambulatory Visit: Payer: Self-pay

## 2019-02-17 ENCOUNTER — Telehealth: Payer: Self-pay | Admitting: Genetic Counselor

## 2019-02-17 DIAGNOSIS — C182 Malignant neoplasm of ascending colon: Secondary | ICD-10-CM | POA: Insufficient documentation

## 2019-02-17 NOTE — Telephone Encounter (Signed)
Called patient regarding upcoming Webex appointment, patient would prefer this be a walk-in visit.

## 2019-02-18 ENCOUNTER — Other Ambulatory Visit (HOSPITAL_COMMUNITY)
Admission: RE | Admit: 2019-02-18 | Discharge: 2019-02-18 | Disposition: A | Discharge status: Home / Self Care | Payer: Medicare Other | Source: Ambulatory Visit | Attending: Surgery | Admitting: Surgery

## 2019-02-18 ENCOUNTER — Other Ambulatory Visit: Payer: Self-pay | Admitting: Oncology

## 2019-02-18 DIAGNOSIS — Z20828 Contact with and (suspected) exposure to other viral communicable diseases: Secondary | ICD-10-CM | POA: Diagnosis present

## 2019-02-18 DIAGNOSIS — Z01812 Encounter for preprocedural laboratory examination: Secondary | ICD-10-CM | POA: Diagnosis not present

## 2019-02-19 LAB — NOVEL CORONAVIRUS, NAA (HOSP ORDER, SEND-OUT TO REF LAB; TAT 18-24 HRS): SARS-CoV-2, NAA: NOT DETECTED

## 2019-02-20 ENCOUNTER — Inpatient Hospital Stay: Payer: Medicare Other

## 2019-02-20 ENCOUNTER — Encounter: Payer: Self-pay | Admitting: Genetic Counselor

## 2019-02-20 ENCOUNTER — Inpatient Hospital Stay (HOSPITAL_BASED_OUTPATIENT_CLINIC_OR_DEPARTMENT_OTHER): Payer: Medicare Other | Admitting: Genetic Counselor

## 2019-02-20 ENCOUNTER — Other Ambulatory Visit: Payer: Self-pay

## 2019-02-20 ENCOUNTER — Other Ambulatory Visit: Payer: Self-pay | Admitting: Genetic Counselor

## 2019-02-20 DIAGNOSIS — Z800 Family history of malignant neoplasm of digestive organs: Secondary | ICD-10-CM | POA: Insufficient documentation

## 2019-02-20 DIAGNOSIS — C182 Malignant neoplasm of ascending colon: Secondary | ICD-10-CM

## 2019-02-20 DIAGNOSIS — Z860100 Personal history of colon polyps, unspecified: Secondary | ICD-10-CM | POA: Insufficient documentation

## 2019-02-20 NOTE — Progress Notes (Signed)
REFERRING PROVIDER: Ladell Pier, MD [address] [place],  [place] [address]  PRIMARY PROVIDER:  Chesley Noon, MD  PRIMARY REASON FOR VISIT:  1. Cancer of right colon - resected 01/17/2019   2. Family history of colon cancer   3. Personal history of colonic polyps      HISTORY OF PRESENT ILLNESS:   [name], a 73 y.o. female, was seen for a  cancer genetics consultation at the request of Dr. Benay Spice due to a personal and family history of cancer.  [name] presents to clinic today to discuss the possibility of a hereditary predisposition to cancer, genetic testing, and to further clarify her future cancer risks, as well as potential cancer risks for family members.   In July 2020, at the age of 10, [name] was diagnosed with cancer of the right colon. The treatment plan chemotherapy and surgery.  [name] reports having colonoscopies every 5 years for several years and having over 38 polyps.   CANCER HISTORY:  Oncology History  Cancer of right colon - resected 01/17/2019  01/17/2019 Initial Diagnosis   Cancer of right colon - resected 01/17/2019   02/07/2019 Cancer Staging   Staging form: Colon and Rectum, AJCC 8th Edition - Pathologic: Stage IIIB (pT3, pN1b, cM0) - Signed by Ladell Pier, MD on 02/07/2019   02/23/2019 -  Chemotherapy   The patient had palonosetron (ALOXI) injection 0.25 mg, 0.25 mg, Intravenous,  Once, 0 of 6 cycles leucovorin 768 mg in dextrose 5 % 250 mL infusion, 400 mg/m2 = 768 mg, Intravenous,  Once, 0 of 6 cycles oxaliplatin (ELOXATIN) 165 mg in dextrose 5 % 500 mL chemo infusion, 85 mg/m2 = 165 mg, Intravenous,  Once, 0 of 6 cycles fluorouracil (ADRUCIL) chemo injection 750 mg, 400 mg/m2 = 750 mg, Intravenous,  Once, 0 of 6 cycles fluorouracil (ADRUCIL) 4,600 mg in sodium chloride 0.9 % 58 mL chemo infusion, 2,400 mg/m2 = 4,600 mg, Intravenous, 1 Day/Dose, 0 of 6 cycles  for chemotherapy treatment.       RISK  FACTORS:  Menarche was at age 4-13.  First live birth at age 28.  OCP use for approximately 2-3 years.  Ovaries intact: yes.  Hysterectomy: yes.  Menopausal status: postmenopausal.  HRT use: 0 years. Colonoscopy: yes; over 20 polyps. Mammogram within the last year: yes. Number of breast biopsies: 0. Up to date with pelvic exams: no. Any excessive radiation exposure in the past: no  Past Medical History:  Diagnosis Date  . Cancer of right colon ([place])   . Coronary artery disease   . CVA (cerebral infarction) 1996  . Dyspnea    going up stairs and hills  . Family history of colon cancer   . GERD (gastroesophageal reflux disease)   . History of cardiomegaly   . Hyperlipidemia   . Hypertension   . Iron deficiency anemia   . Myocardial infarction ([place]) 2017  . NSTEMI (non-ST elevated myocardial infarction) ([place])   . Personal history of colonic polyps   . PONV (postoperative nausea and vomiting)   . Right shoulder pain    more painful at night , cold makes it worse  . Severe obesity ([place])     Past Surgical History:  Procedure Laterality Date  . ABDOMINAL HYSTERECTOMY    . BACK SURGERY     x2  . CARDIAC CATHETERIZATION N/A 02/28/2016   Procedure: Left Heart Cath and Coronary Angiography;  Surgeon: Peter M Martinique, MD;  Location:  [facility];  Service: Cardiovascular;  Laterality: N/A;  . CARDIAC CATHETERIZATION N/A 02/28/2016   Procedure: Coronary Stent Intervention;  Surgeon: Peter M Martinique, MD;  Location: [facility];  Service: Cardiovascular;  Laterality: N/A;  . CHOLECYSTECTOMY    . COLONOSCOPY  11/24/2018  . CORONARY ANGIOPLASTY    . CYST EXCISION Left    Cheek  . LAPAROSCOPIC RIGHT HEMI COLECTOMY Right 01/17/2019   Procedure: LAPAROSCOPIC RIGHT HEMI COLECTOMY;  Surgeon: Alphonsa Overall, MD;  Location: [facility];  Service: General;  Laterality: Right;  . NASAL FRACTURE SURGERY    . UPPER GI ENDOSCOPY  11/24/2018    Social History   Socioeconomic History   . Marital status: Married    Spouse name: Not on file  . Number of children: Not on file  . Years of education: Not on file  . Highest education level: Not on file  Occupational History  . Not on file  Social Needs  . Financial resource strain: Not on file  . Food insecurity    Worry: Not on file    Inability: Not on file  . Transportation needs    Medical: Not on file    Non-medical: Not on file  Tobacco Use  . Smoking status: Never Smoker  . Smokeless tobacco: Never Used  Substance and Sexual Activity  . Alcohol use: No  . Drug use: No  . Sexual activity: Not on file  Lifestyle  . Physical activity    Days per week: Not on file    Minutes per session: Not on file  . Stress: Not on file  Relationships  . Social Herbalist on phone: Not on file    Gets together: Not on file    Attends religious service: Not on file    Active member of club or organization: Not on file    Attends meetings of clubs or organizations: Not on file    Relationship status: Not on file  Other Topics Concern  . Not on file  Social History Narrative  . Not on file     FAMILY HISTORY:  We obtained a detailed, 4-generation family history.  Significant diagnoses are listed below: Family History  Problem Relation Age of Onset  . Esophageal cancer Sister 8  . Lung cancer Brother 45  . CAD Daughter   . Breast cancer Maternal Aunt        dx over 43  . Colon polyps Sister        total of 24 polyps  . Colon cancer Sister 81  . Breast cancer Cousin        dx in her 66s; mat first cousin    The patient has one daughter and one granddaughter, who are cancer free.  She has one brother and four sisters who lived to adulthood.  Her brother died of lung cancer and was a smoker.  One sister had esophageal cancer, and one sister has colon cancer.  This sister reportedly had 24 polyps. Two sisters are cancer free.  Both parents are deceased from non-cancer related issues.  The patient's  mother had a partial mastectomy from a breast tumor that reportedly was not cancer.  She had three full sisters and two maternal half sisters and a half brother.  One full sister had breast cancer, and this sister's daughter had breast cancer in her 104's.  The maternal grandparents are both deceased from non-cancer related issues.  The patient's father died at  56.  He had three brothers who are reportedly cancer free.  The paternal grandparents are deceased from non-cancer related issues.   Ms. Scriven is unaware of previous family history of genetic testing for hereditary cancer risks. Patient's maternal ancestors are of [place] descent, and paternal ancestors are of [place] descent. There is no reported Ashkenazi Jewish ancestry. There is no known consanguinity.  GENETIC COUNSELING ASSESSMENT: Ms. Fitzsimmons is a 73 y.o. female with a personal and family history of cancer and colon polyps which is somewhat suggestive of a hereditary cancer syndrome and predisposition to cancer given her polyp count over 37 and young age of breast cancer in her cousin. We, therefore, discussed and recommended the following at today's visit.   DISCUSSION: We discussed that 5 - 10% of cancer is hereditary, with most cases of colon cancer and polyposis associated with APC mutations.  About 5-7% of colon cancer is hereditary - polyposis is a minority of these cases.  There are other genes that can be associated with hereditary polyposis cancer syndromes.  These include MUTYH, POLE, POLD1 and GREM1.  We discussed that testing is beneficial for several reasons including knowing how to follow individuals after completing their treatment, identifying whether potential treatment options such as PARP inhibitors would be beneficial, and understand if other family members could be at risk for cancer and allow them to undergo genetic testing.   We reviewed the characteristics, features and inheritance patterns of hereditary cancer  syndromes. We also discussed genetic testing, including the appropriate family members to test, the process of testing, insurance coverage and turn-around-time for results. We discussed the implications of a negative, positive, carrier and/or variant of uncertain significant result. We recommended Ms. Lawrance pursue genetic testing for the CancerNext-Expanded+RNAinsight gene panel. The CancerNext-Expanded gene panel offered by [facility] and includes sequencing and rearrangement analysis for the following 67 genes: AIP, ALK, APC*, ATM*, BAP1, BARD1, BLM, BMPR1A, BRCA1*, BRCA2*, BRIP1*, CDH1*, CDK4, CDKN1B, CDKN2A, CHEK2*, DICER1, FANCC, FH, FLCN, GALNT12, HOXB13, MAX, MEN1, MET, MLH1*, MRE11A, MSH2*, MSH6*, MUTYH*, NBN, NF1*, NF2, PALB2*, PHOX2B, PMS2*, POLD1, POLE, POT1, PRKAR1A, PTCH1, PTEN*, RAD50, RAD51C*, RAD51D*, RB1, RET, SDHA, SDHAF2, SDHB, SDHC, SDHD, SMAD4, SMARCA4, SMARCB1, SMARCE1, STK11, SUFU, TMEM127, TP53*, TSC1, TSC2, VHL and XRCC2 (sequencing and deletion/duplication); MITF (sequencing only); EPCAM and GREM1 (deletion/duplication only). DNA and RNA analyses performed for * genes.   Based on Ms. Corso's personal and family history of cancer, she meets medical criteria for genetic testing. Despite that she meets criteria, she may still have an out of pocket cost. We discussed that if her out of pocket cost for testing is over $100, the laboratory will call and confirm whether she wants to proceed with testing.  If the out of pocket cost of testing is less than $100 she will be billed by the genetic testing laboratory.   PLAN: Despite our recommendation, Ms. Dingledine did not wish to pursue genetic testing at today's visit. She is feeling overwhelmed by her cancer diagnosis and the need for chemotherapy.  We understand this decision and remain available to coordinate genetic testing at any time in the future. In fact, she indicated that she would consider genetic testing after her  chemotherapy is completed.  We, therefore, recommend Ms. Hanks continue to follow the cancer screening guidelines given by her primary healthcare provider.  Lastly, we encouraged Ms. Wilner to remain in contact with cancer genetics annually so that we can continuously update the family history and inform her of any changes  in cancer genetics and testing that may be of benefit for this family.   Ms. Slaven questions were answered to her satisfaction today. Our contact information was provided should additional questions or concerns arise. Thank you for the referral and allowing [place] to share in the care of your patient.   Blayne Garlick P. Florene Glen, [place], [facility] Licensed, Insurance risk surveyor [email or web address] phone: [phone]  The patient was seen for a total of 65 minutes in face-to-face genetic counseling.  This patient was discussed with Drs. Magrinat, Lindi Adie and/or Burr Medico who agrees with the above.    _______________________________________________________________________ For Office Staff:  Number of people involved in session: 1 Was an Intern/ student involved with case: yes: Amy Danelle Earthly

## 2019-02-21 ENCOUNTER — Encounter (HOSPITAL_COMMUNITY): Payer: Self-pay | Admitting: *Deleted

## 2019-02-21 ENCOUNTER — Other Ambulatory Visit: Payer: Self-pay

## 2019-02-21 NOTE — Progress Notes (Signed)
Spoke with pt for pre-op call. Pt has hx of CAD with one stent. Dr. Peter Martinique is her cardiologist. Last office visit was in July, 2020. Pt denies any recent chest pain or sob. Pt states she is not diabetic.   Pt had Covid test done on 02/18/19 and it's negative. Pt has been in quarantine since test was done, did have an appt at the [facility] and she states she wore her mask and everyone in the appt had their masks on also.   Pt informed that she may have one visitor wait in the waiting room while she is in pre-op, surgery and PACU. Pt voiced understanding.  PCP - Dr. Melford Aase

## 2019-02-21 NOTE — Progress Notes (Signed)
[facility]   [phone] [phone]   Clinic Follow up Note   Patient Care Team: Chesley Noon, MD as PCP - General (Family Medicine) Martinique, Peter M, MD as PCP - Cardiology (Cardiology) Richmond Ferrie, MD as Consulting Physician (Gastroenterology) Arna Snipe, RN as Registered Nurse Benay Spice Izola Price, MD as Consulting Physician (Oncology) 02/23/2019  CHIEF COMPLAINT: f/u colon cancer   INTERVAL HISTORY: [name] returns for f/u and treatment as scheduled. She underwent staging CT chest and PAC placement. She attended chemo class. PAC site is sore, improved with tramadol. She is fatigued but functional. Mild DOE is stable. No cough or chest pain. No signs of thrombosis. Eating and drinking well. Denies n/v/c/d. Last BM yesterday. Abdominal pain in RLQ is mild, intermittent and controlled with tylenol PRN. No recent fever or chills. Denies baseline neuropathy.    MEDICAL HISTORY:  Past Medical History:  Diagnosis Date   Cancer of right colon ([place])    Constipation    Coronary artery disease    CVA (cerebral infarction) 1996   Dyspnea    going up stairs and hills   Family history of colon cancer    GERD (gastroesophageal reflux disease)    History of cardiomegaly    Hyperlipidemia    Hypertension    Iron deficiency anemia    Myocardial infarction ([place]) 2017   NSTEMI (non-ST elevated myocardial infarction) ([place])    Personal history of colonic polyps    PONV (postoperative nausea and vomiting)    Right shoulder pain    more painful at night , cold makes it worse   Severe obesity ([place])     SURGICAL HISTORY: Past Surgical History:  Procedure Laterality Date   ABDOMINAL HYSTERECTOMY     BACK SURGERY     x2   CARDIAC CATHETERIZATION N/A 02/28/2016   Procedure: Left Heart Cath and Coronary Angiography;  Surgeon: Peter M Martinique, MD;  Location: [facility];  Service: Cardiovascular;  Laterality: N/A;    CARDIAC CATHETERIZATION N/A 02/28/2016   Procedure: Coronary Stent Intervention;  Surgeon: Peter M Martinique, MD;  Location: [facility];  Service: Cardiovascular;  Laterality: N/A;   CHOLECYSTECTOMY     COLONOSCOPY  11/24/2018   CORONARY ANGIOPLASTY     CYST EXCISION Left    Cheek   LAPAROSCOPIC RIGHT HEMI COLECTOMY Right 01/17/2019   Procedure: LAPAROSCOPIC RIGHT HEMI COLECTOMY;  Surgeon: Alphonsa Overall, MD;  Location: [facility];  Service: General;  Laterality: Right;   NASAL FRACTURE SURGERY     UPPER GI ENDOSCOPY  11/24/2018    I have reviewed the social history and family history with the patient and they are unchanged from previous note.  ALLERGIES:  is allergic to guaifenesin er; lotensin [benazepril hcl]; and propoxyphene.  MEDICATIONS:  Current Outpatient Medications  Medication Sig Dispense Refill   amLODipine (NORVASC) 5 MG tablet Take 5 mg by mouth daily.     cholecalciferol (VITAMIN D) 25 MCG (1000 UT) tablet Take 2,000 Units by mouth daily.     ezetimibe (ZETIA) 10 MG tablet Take 1 tablet by mouth once daily (Patient taking differently: Take 10 mg by mouth at bedtime. ) 90 tablet 0   FERROUS SULFATE DRIED PO Take 43 mg by mouth daily.     hydrochlorothiazide (HYDRODIURIL) 25 MG tablet Take 25 mg by mouth daily.     lidocaine-prilocaine (EMLA) cream Apply to portacath site 1 hour prior to use 30 g 2   losartan (COZAAR)  100 MG tablet Take 100 mg by mouth daily.     omeprazole (PRILOSEC) 40 MG capsule Take 40 mg by mouth daily before breakfast.      prochlorperazine (COMPAZINE) 10 MG tablet Take 1 tablet (10 mg total) by mouth every 6 (six) hours as needed for nausea or vomiting. 30 tablet 2   traMADol (ULTRAM) 50 MG tablet Take 1 tablet (50 mg total) by mouth every 6 (six) hours as needed for moderate pain or severe pain. 10 tablet 0   No current facility-administered medications for this visit.    Facility-Administered Medications Ordered in Other Visits    Medication Dose Route Frequency Provider Last Rate Last Dose   dextrose 5 % solution   Intravenous Once Ladell Pier, MD       fluorouracil (ADRUCIL) 4,600 mg in sodium chloride 0.9 % 58 mL chemo infusion  2,400 mg/m2 (Treatment Plan Recorded) Intravenous 1 day or 1 dose Ladell Pier, MD       fluorouracil (ADRUCIL) chemo injection 750 mg  400 mg/m2 (Treatment Plan Recorded) Intravenous Once Ladell Pier, MD       influenza vaccine adjuvanted (FLUAD) injection 0.5 mL  0.5 mL Intramuscular Once Ladell Pier, MD       leucovorin 768 mg in dextrose 5 % 250 mL infusion  400 mg/m2 (Treatment Plan Recorded) Intravenous Once Ladell Pier, MD       oxaliplatin (ELOXATIN) 150 mg in dextrose 5 % 500 mL chemo infusion  150 mg Intravenous Once Ladell Pier, MD        PHYSICAL EXAMINATION: ECOG PERFORMANCE STATUS: 1 - Symptomatic but completely ambulatory  Vitals:   02/23/19 0855  BP: 138/72  Pulse: 67  Resp: 17  Temp: 98.2 F (36.8 C)  SpO2: 100%   Filed Weights   02/23/19 0855  Weight: 192 lb 1.6 oz (87.1 kg)    GENERAL:alert, no distress and comfortable SKIN: no obvious rash  EYES:  sclera clear LUNGS: clear with normal breathing effort HEART: regular rate & rhythm, no lower extremity edema ABDOMEN: abdomen soft, non-tender and normal bowel sounds. Healed surgical incisions  Musculoskeletal:no cyanosis of digits  NEURO: alert & oriented x 3 with fluent speech, normal gait PAC without erythema, mild swelling and tenderness to left upper chest   LABORATORY DATA:  I have reviewed the data as listed CBC Latest Ref Rng & Units 02/23/2019 02/22/2019 01/20/2019  WBC 4.0 - 10.5 K/uL 11.7(H) 9.7 10.1  Hemoglobin 12.0 - 15.0 g/dL 12.1 13.7 11.8(L)  Hematocrit 36.0 - 46.0 % 37.1 41.4 38.7  Platelets 150 - 400 K/uL 324 354 312     CMP Latest Ref Rng & Units 02/23/2019 02/22/2019 01/20/2019  Glucose 70 - 99 mg/dL 93 109(H) 123(H)  BUN 8 - 23 mg/dL 17 11 7(L)   Creatinine 0.44 - 1.00 mg/dL 1.13(H) 1.13(H) 1.07(H)  Sodium 135 - 145 mmol/L 139 138 134(L)  Potassium 3.5 - 5.1 mmol/L 3.5 2.9(L) 4.3  Chloride 98 - 111 mmol/L 102 100 101  CO2 22 - 32 mmol/L 27 25 25   Calcium 8.9 - 10.3 mg/dL 9.7 9.8 9.0  Total Protein 6.5 - 8.1 g/dL 7.4 - -  Total Bilirubin 0.3 - 1.2 mg/dL 0.4 - -  Alkaline Phos 38 - 126 U/L 78 - -  AST 15 - 41 U/L 17 - -  ALT 0 - 44 U/L 14 - -      RADIOGRAPHIC STUDIES: I have personally reviewed the radiological  images as listed and agreed with the findings in the report. Dg Chest Port 1 View  Result Date: 02/22/2019 CLINICAL DATA:  Status post Port-A-Cath placement today. EXAM: PORTABLE CHEST 1 VIEW COMPARISON:  PA and lateral chest 02/27/2016. FINDINGS: New left subclavian approach Port-A-Cath is in place. Tubing is intact and the catheter tip projects in the mid to lower superior vena cava. No pneumothorax. Mild left basilar atelectasis is seen. No pleural effusion. Heart size is upper normal. Aortic atherosclerosis noted. IMPRESSION: Port-A-Cath tip projects in the mid to lower superior vena cava. No pneumothorax or acute disease. Electronically Signed   By: Inge Rise M.D.   On: 02/22/2019 10:15   Dg Fluoro Guide Cv Line-no Report  Result Date: 02/22/2019 Fluoroscopy was utilized by the requesting physician.  No radiographic interpretation.     ASSESSMENT & PLAN:  1. Colon cancer (T3, N1b)  Colonoscopy 11/24/2018-large 5 cm malignant appearing fungating mass encompassing the ileocecal valve was identified.  Pathology showed invasive adenocarcinoma, moderately differentiated.    CT abdomen/pelvis 12/07/2018-eccentric wall thickening in the cecum, around the ileocecal valve, compatible with findings reported at the recent colonoscopy.  Borderline enlarged adjacent lymph nodes in the ileocolic mesentery.  No evidence for liver metastases.    CEA 01/13/2019-12.7  CEA 02/23/19 postop/at start of adjuvant chemo -  1.83  01/17/2019 right hemicolectomy by Dr. Lucia Gaskins.  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  Declined genetic testing on 02/20/19, patient indicated she may reconsider after completing adjuvant treatment. Seen by Roma Kayser  [facility] placement per Dr. Lucia Gaskins 02/22/2019   Cycle 1 adjuvant FOLFOX 02/23/19   2. Iron deficiency anemia 3. Hypertension 4. History of MI status post stent placement 5. History of stroke 1996  Disposition:  Mr. Wisenbaker appears stable. She has recovered well from abdominal surgery and PAC placement. Bowels are normal. Pain is well controlled. She completed staging CT chest which is negative for metastatic disease; I reviewed with her today.   Labs reviewed, CBC and CMP are adequate for treatment. Post-op CEA is normal today, 1.83. She will proceed with adjuvant chemo as planned with cycle 1 FOLFOX today. She will return for f/u and cycle 2 in two weeks. I reviewed the case with Dr. Benay Spice.  We reviewed symptom management and signs and symptoms that should prompt her to call [facility] to report, such as fever, chills, severe fatigue, dehydration, uncontrolled GI symptoms, rash, new leg swelling/pain, or changes in her respiratory function. She understands.    Orders Placed This Encounter  Procedures   CBC with Differential ([place] Only)    Standing Status:   Future    Standing Expiration Date:   02/23/2020   CMP ([place] only)    Standing Status:   Future    Standing Expiration Date:   02/23/2020   All questions were answered. The patient knows to call the clinic with any problems, questions or concerns. No barriers to learning was detected.     Alla Feeling,  NP 02/23/19

## 2019-02-21 NOTE — H&P (Signed)
[name]  Location: [facility] Patient #: [id number] DOB: [birth year] Married / Language: English / Race: White Female  History of Present Illness   The patient is a 73 year old female who presents with a complaint of colon tumor.  The PCP is D.r Anastasia Pall  The patient was referred by Dr. Lenna Sciara. Earlean Shawl.  She comes by herself.  [The Covid-19 virus has disrupted normal medical care in [place] and across the nation. We have sometimes had to alter normal surgical/medical care to limit this epidemic and we have explained these changes to the patient.]  She underwent laparoscopic right colectomy - 01/17/2019 - Lucia Gaskins Her pathology [phone]) - showed 3.3 invasive adenoca, 2/18 nodes (T3, N1b). I gave her a copy of her path report. We had put in a request for medical oncology, but she had not heard - we will follow up on that.  Plan: 1) Medical oncology consult (she is to call back in one week if she does not hear from them), 2) Follow up in 6 months  Review of Systems as stated in this history (HPI) or in the review of systems. Otherwise all other 12 point ROS are negative  Past Medical History: 1. Laparoscopic right colectomy - 01/17/2019 - Lucia Gaskins She underwent a colonoscopy by Dr. 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 Her pathology [id number]) - showed 3.3 invasive adenoca, 2/18 nodes (T3, N1b) Plan oncology consult  2. Back surgery - 1983/1984 - [place] 3. Cholecystectomy - 2006 - [place] 4. Hysterectomy - [place] 5. CAD s/p NSTEMI cardiac catheterization - 02/26/2016 She has been followed by Dr. Martinique 6. HTN 7. Hyperlipidemia 8. CVA - 1996 Some minimal left leg weakness 9. Obese - BMI - 37  Social History: Married. Husband Rosanna Randy. Has one daughter, Duwaine Maxin, who lives with her. And Candice has one daughter.  Allergies  [facility], CMA; 02/01/2019 11:10 AM) No Known Allergies  [12/16/2018]: No Known Drug Allergies  [12/16/2018]: Allergies Reconciled   Medication History Nance Pew, CMA; 02/01/2019 11:11 AM) Neomycin Sulfate (500MG  Tablet, 2 (two) Oral SEE NOTE, Taken starting 12/16/2018) Active. (TAKE TWO TABLETS AT 2 PM, 3 PM, AND 10 PM THE DAY PRIOR TO SURGERY) Flagyl (500MG  Tablet, 2 (two) Oral SEE NOTE, Taken starting 12/16/2018) Active. (Take at 2pm, 3pm, and 10pm the day prior to your colon operation) Ezetimibe (10MG  Tablet, Oral) Active. amLODIPine Besylate (2.5MG  Tablet, Oral) Active. Losartan Potassium (25MG  Tablet, Oral) Active. hydroCHLOROthiazide (25MG  Tablet, Oral) Active. Gabapentin (300MG /6ML Solution, Oral) Active. Medications Reconciled  Vitals (Sabrina Canty CMA; 02/01/2019 11:12 AM) 02/01/2019 11:11 AM Weight: 188.38 lb Height: 61in Body Surface Area: 1.84 m Body Mass Index: 35.59 kg/m  Temp.: 77F (Temporal)  Pulse: 114 (Regular)  BP: 128/64(Sitting, Left Arm, Standard)   Physical Exam  General: Moderately obese WF who is alert and generally healthy appearing. She is wearing a mask. HEENT: Normal. Pupils equal.  Neck: Supple. No mass. No thyroid mass.  Lymph Nodes: No supraclavicular or cervical nodes.  Lungs: Clear to auscultation and symmetric breath sounds. Heart: RRR. No murmur or rub.  Abdomen: Soft. No mass. No tenderness. No hernia. Normal bowel sounds.  She has a lower abdominal midline scar - from her hysterectomy. Abdominal incision looks good  Extremities: Good strength and ROM in upper and lower extremities.   Assessment & Plan  1.  CANCER OF RIGHT COLON (C18.2)  Story: Laparoscopic right colectomy - 01/17/2019 -  Charnetta Wulff  Her pathology [phone]) - showed 3.3 invasive adenoca, 2/18 nodes (T3, N1b)  Plan:  1. Oncology consult   She saw Dr. Benay Spice    Addendum Note(Kalen Neidert H. Lucia Gaskins MD; 02/13/2019 3:47  PM)    We saw her today, plan is for adjuvant FOLFOX starting 02/23/2019    Can you place a Port-A-Cath prior to 02/23/2019 ?    Thanks    Brad   2. Follow up in 6 months  3. Follow up with Dr. Melford Aase for asymmetric pupils  2.  Power port placement for chemotx  I discussed the indications and potential complications of the power port placement.  The primary complications of the power port, include, but are not limited to, bleeding, infection, nerve injury, thrombosis, and pneumothorax.  3. Back surgery - 1983/1984 - [place] 4. CAD s/p NSTEMI  cardiac catheterization - 02/26/2016  She has been followed by Dr. Martinique 5. HTN 6. Hyperlipidemia 7. CVA - 1996 Some minimal left leg weakness 8. Obese - BMI - 37 9.  Asymmetric pupils   Alphonsa Overall, MD, [facility] Pager: [phone] Office phone:  [phone]

## 2019-02-21 NOTE — Anesthesia Preprocedure Evaluation (Addendum)
Anesthesia Evaluation  Patient identified by MRN, date of birth, ID band Patient awake    Reviewed: Allergy & Precautions, NPO status , Patient's Chart, lab work & pertinent test results  History of Anesthesia Complications (+) PONV and history of anesthetic complications  Airway Mallampati: II  TM Distance: >3 FB Neck ROM: Full    Dental no notable dental hx. (+) Dental Advisory Given   Pulmonary neg pulmonary ROS,    Pulmonary exam normal        Cardiovascular hypertension, Pt. on medications + CAD, + Past MI and + Cardiac Stents  Normal cardiovascular exam  Study Conclusions  - Left ventricle: The cavity size was normal. Systolic function was   normal. The estimated ejection fraction was in the range of 55%   to 60%. Wall motion was normal; there were no regional wall   motion abnormalities. There was an increased relative   contribution of atrial contraction to ventricular filling.   Doppler parameters are consistent with abnormal left ventricular   relaxation (grade 1 diastolic dysfunction). - Aortic valve: There was mild regurgitation. - Right ventricle: Systolic function was mildly reduced.    Neuro/Psych CVA, Residual Symptoms negative psych ROS   GI/Hepatic Neg liver ROS, GERD  ,Colon Ca   Endo/Other  negative endocrine ROS  Renal/GU negative Renal ROS  negative genitourinary   Musculoskeletal negative musculoskeletal ROS (+)   Abdominal   Peds negative pediatric ROS (+)  Hematology negative hematology ROS (+)   Anesthesia Other Findings   Reproductive/Obstetrics negative OB ROS                           Anesthesia Physical  Anesthesia Plan  ASA: III  Anesthesia Plan: General   Post-op Pain Management:    Induction: Intravenous  PONV Risk Score and Plan: 4 or greater and Ondansetron, Dexamethasone, Treatment may vary due to age or medical condition and  Scopolamine patch - Pre-op  Airway Management Planned: LMA  Additional Equipment:   Intra-op Plan:   Post-operative Plan: Extubation in OR  Informed Consent: I have reviewed the patients History and Physical, chart, labs and discussed the procedure including the risks, benefits and alternatives for the proposed anesthesia with the patient or authorized representative who has indicated his/her understanding and acceptance.     Dental advisory given  Plan Discussed with: Anesthesiologist and CRNA  Anesthesia Plan Comments:        Anesthesia Quick Evaluation

## 2019-02-22 ENCOUNTER — Ambulatory Visit (HOSPITAL_COMMUNITY): Payer: Medicare Other | Admitting: Anesthesiology

## 2019-02-22 ENCOUNTER — Ambulatory Visit (HOSPITAL_COMMUNITY): Payer: Medicare Other

## 2019-02-22 ENCOUNTER — Other Ambulatory Visit: Payer: Medicare Other

## 2019-02-22 ENCOUNTER — Encounter (HOSPITAL_COMMUNITY)
Admission: RE | Disposition: A | Discharge status: Home / Self Care | Payer: Self-pay | Source: Home / Self Care | Attending: Surgery

## 2019-02-22 ENCOUNTER — Ambulatory Visit (HOSPITAL_COMMUNITY)
Admission: RE | Admit: 2019-02-22 | Discharge: 2019-02-22 | Disposition: A | Discharge status: Home / Self Care | Payer: Medicare Other | Attending: Surgery | Admitting: Surgery

## 2019-02-22 ENCOUNTER — Ambulatory Visit: Payer: Medicare Other | Admitting: Nurse Practitioner

## 2019-02-22 ENCOUNTER — Encounter (HOSPITAL_COMMUNITY): Payer: Self-pay | Admitting: *Deleted

## 2019-02-22 ENCOUNTER — Ambulatory Visit: Payer: Medicare Other

## 2019-02-22 DIAGNOSIS — Z6835 Body mass index (BMI) 35.0-35.9, adult: Secondary | ICD-10-CM | POA: Diagnosis not present

## 2019-02-22 DIAGNOSIS — C182 Malignant neoplasm of ascending colon: Secondary | ICD-10-CM | POA: Insufficient documentation

## 2019-02-22 DIAGNOSIS — E669 Obesity, unspecified: Secondary | ICD-10-CM | POA: Insufficient documentation

## 2019-02-22 DIAGNOSIS — E785 Hyperlipidemia, unspecified: Secondary | ICD-10-CM | POA: Diagnosis not present

## 2019-02-22 DIAGNOSIS — I10 Essential (primary) hypertension: Secondary | ICD-10-CM | POA: Insufficient documentation

## 2019-02-22 DIAGNOSIS — Z95828 Presence of other vascular implants and grafts: Secondary | ICD-10-CM

## 2019-02-22 DIAGNOSIS — Z9049 Acquired absence of other specified parts of digestive tract: Secondary | ICD-10-CM | POA: Diagnosis not present

## 2019-02-22 DIAGNOSIS — I69354 Hemiplegia and hemiparesis following cerebral infarction affecting left non-dominant side: Secondary | ICD-10-CM | POA: Diagnosis not present

## 2019-02-22 DIAGNOSIS — Z79899 Other long term (current) drug therapy: Secondary | ICD-10-CM | POA: Diagnosis not present

## 2019-02-22 DIAGNOSIS — Z90710 Acquired absence of both cervix and uterus: Secondary | ICD-10-CM | POA: Diagnosis not present

## 2019-02-22 DIAGNOSIS — I2510 Atherosclerotic heart disease of native coronary artery without angina pectoris: Secondary | ICD-10-CM | POA: Diagnosis not present

## 2019-02-22 DIAGNOSIS — I252 Old myocardial infarction: Secondary | ICD-10-CM | POA: Insufficient documentation

## 2019-02-22 DIAGNOSIS — C189 Malignant neoplasm of colon, unspecified: Secondary | ICD-10-CM

## 2019-02-22 HISTORY — PX: PORTACATH PLACEMENT: SHX2246

## 2019-02-22 HISTORY — DX: Constipation, unspecified: K59.00

## 2019-02-22 LAB — BASIC METABOLIC PANEL
Anion gap: 13 (ref 5–15)
BUN: 11 mg/dL (ref 8–23)
CO2: 25 mmol/L (ref 22–32)
Calcium: 9.8 mg/dL (ref 8.9–10.3)
Chloride: 100 mmol/L (ref 98–111)
Creatinine, Ser: 1.13 mg/dL — ABNORMAL HIGH (ref 0.44–1.00)
GFR calc Af Amer: 56 mL/min — ABNORMAL LOW (ref >60)
GFR calc non Af Amer: 48 mL/min — ABNORMAL LOW (ref >60)
Glucose, Bld: 109 mg/dL — ABNORMAL HIGH (ref 70–99)
Potassium: 2.9 mmol/L — ABNORMAL LOW (ref 3.5–5.1)
Sodium: 138 mmol/L (ref 135–145)

## 2019-02-22 LAB — CBC
HCT: 41.4 % (ref 36.0–46.0)
Hemoglobin: 13.7 g/dL (ref 12.0–15.0)
MCH: 28.8 pg (ref 26.0–34.0)
MCHC: 33.1 g/dL (ref 30.0–36.0)
MCV: 87.2 fL (ref 80.0–100.0)
Platelets: 354 10*3/uL (ref 150–400)
RBC: 4.75 MIL/uL (ref 3.87–5.11)
RDW: 16.6 % — ABNORMAL HIGH (ref 11.5–15.5)
WBC: 9.7 10*3/uL (ref 4.0–10.5)
nRBC: 0 % (ref 0.0–0.2)

## 2019-02-22 SURGERY — INSERTION, TUNNELED CENTRAL VENOUS DEVICE, WITH PORT
Anesthesia: General | Site: Chest

## 2019-02-22 MED ORDER — HEPARIN SOD (PORK) LOCK FLUSH 100 UNIT/ML IV SOLN
INTRAVENOUS | Status: AC
  Filled 2019-02-22: qty 5

## 2019-02-22 MED ORDER — ONDANSETRON HCL 4 MG/2ML IJ SOLN
INTRAMUSCULAR | Status: AC
  Filled 2019-02-22: qty 2

## 2019-02-22 MED ORDER — PROMETHAZINE HCL 25 MG/ML IJ SOLN
INTRAMUSCULAR | Status: AC
  Filled 2019-02-22: qty 1

## 2019-02-22 MED ORDER — LIDOCAINE 2% (20 MG/ML) 5 ML SYRINGE
INTRAMUSCULAR | Status: DC | PRN
  Administered 2019-02-22: 100 mg via INTRAVENOUS

## 2019-02-22 MED ORDER — ONDANSETRON HCL 4 MG/2ML IJ SOLN
INTRAMUSCULAR | Status: DC | PRN
  Administered 2019-02-22: 4 mg via INTRAVENOUS

## 2019-02-22 MED ORDER — MIDAZOLAM HCL 2 MG/2ML IJ SOLN
INTRAMUSCULAR | Status: AC
  Filled 2019-02-22: qty 2

## 2019-02-22 MED ORDER — MIDAZOLAM HCL 5 MG/5ML IJ SOLN
INTRAMUSCULAR | Status: DC | PRN
  Administered 2019-02-22: 2 mg via INTRAVENOUS

## 2019-02-22 MED ORDER — CHLORHEXIDINE GLUCONATE CLOTH 2 % EX PADS: 6.0000 | MEDICATED_PAD | Freq: Once | CUTANEOUS | Status: DC

## 2019-02-22 MED ORDER — LIDOCAINE 2% (20 MG/ML) 5 ML SYRINGE
INTRAMUSCULAR | Status: AC
  Filled 2019-02-22: qty 5

## 2019-02-22 MED ORDER — FENTANYL CITRATE (PF) 100 MCG/2ML IJ SOLN
INTRAMUSCULAR | Status: DC | PRN
  Administered 2019-02-22: 50 ug via INTRAVENOUS

## 2019-02-22 MED ORDER — PROPOFOL 10 MG/ML IV BOLUS
INTRAVENOUS | Status: AC
  Filled 2019-02-22: qty 20

## 2019-02-22 MED ORDER — ACETAMINOPHEN 500 MG PO TABS
1000.0000 mg | ORAL_TABLET | ORAL | Status: AC
  Administered 2019-02-22: 07:00:00 1000 mg via ORAL
  Filled 2019-02-22: qty 2

## 2019-02-22 MED ORDER — BUPIVACAINE-EPINEPHRINE 0.25% -1:200000 IJ SOLN
INTRAMUSCULAR | Status: DC | PRN
Start: 2019-02-22 — End: 2019-02-22
  Administered 2019-02-22: 10 mL

## 2019-02-22 MED ORDER — PHENYLEPHRINE 40 MCG/ML (10ML) SYRINGE FOR IV PUSH (FOR BLOOD PRESSURE SUPPORT)
PREFILLED_SYRINGE | INTRAVENOUS | Status: DC | PRN
  Administered 2019-02-22 (×4): 80 ug via INTRAVENOUS

## 2019-02-22 MED ORDER — CEFAZOLIN SODIUM-DEXTROSE 2-4 GM/100ML-% IV SOLN
2.0000 g | INTRAVENOUS | Status: AC
  Administered 2019-02-22: 2 g via INTRAVENOUS
  Filled 2019-02-22: qty 100

## 2019-02-22 MED ORDER — SCOPOLAMINE 1 MG/3DAYS TD PT72
1.0000 | MEDICATED_PATCH | TRANSDERMAL | Status: DC
  Administered 2019-02-22: 1.5 mg via TRANSDERMAL
  Filled 2019-02-22: qty 1

## 2019-02-22 MED ORDER — PHENYLEPHRINE 40 MCG/ML (10ML) SYRINGE FOR IV PUSH (FOR BLOOD PRESSURE SUPPORT)
PREFILLED_SYRINGE | INTRAVENOUS | Status: AC
  Filled 2019-02-22: qty 10

## 2019-02-22 MED ORDER — PROMETHAZINE HCL 25 MG/ML IJ SOLN
6.2500 mg | INTRAMUSCULAR | Status: DC | PRN
  Administered 2019-02-22: 6.25 mg via INTRAVENOUS

## 2019-02-22 MED ORDER — PROPOFOL 10 MG/ML IV BOLUS
INTRAVENOUS | Status: DC | PRN
  Administered 2019-02-22: 150 mg via INTRAVENOUS

## 2019-02-22 MED ORDER — FENTANYL CITRATE (PF) 250 MCG/5ML IJ SOLN
INTRAMUSCULAR | Status: AC
  Filled 2019-02-22: qty 5

## 2019-02-22 MED ORDER — BUPIVACAINE-EPINEPHRINE (PF) 0.25% -1:200000 IJ SOLN
INTRAMUSCULAR | Status: AC
  Filled 2019-02-22: qty 30

## 2019-02-22 MED ORDER — TRAMADOL HCL 50 MG PO TABS: 50.0000 mg | ORAL_TABLET | Freq: Four times a day (QID) | ORAL | 0 refills | Status: DC | PRN

## 2019-02-22 MED ORDER — HEPARIN SOD (PORK) LOCK FLUSH 100 UNIT/ML IV SOLN
INTRAVENOUS | Status: DC | PRN
  Administered 2019-02-22: 400 [IU]

## 2019-02-22 MED ORDER — SODIUM CHLORIDE 0.9 % IV SOLN
INTRAVENOUS | Status: AC
  Filled 2019-02-22: qty 1.2

## 2019-02-22 MED ORDER — ACETAMINOPHEN 500 MG PO TABS: 1000.0000 mg | ORAL_TABLET | Freq: Once | ORAL | Status: DC

## 2019-02-22 MED ORDER — DEXAMETHASONE SODIUM PHOSPHATE 4 MG/ML IJ SOLN
INTRAMUSCULAR | Status: DC | PRN
  Administered 2019-02-22: 5 mg via INTRAVENOUS

## 2019-02-22 MED ORDER — SODIUM CHLORIDE 0.9 % IV SOLN
INTRAVENOUS | Status: DC | PRN
  Administered 2019-02-22: 09:00:00 20 mL

## 2019-02-22 MED ORDER — FENTANYL CITRATE (PF) 100 MCG/2ML IJ SOLN: 25.0000 ug | INTRAMUSCULAR | Status: DC | PRN

## 2019-02-22 MED ORDER — LACTATED RINGERS IV SOLN
INTRAVENOUS | Status: DC | PRN
  Administered 2019-02-22: 08:00:00 via INTRAVENOUS

## 2019-02-22 MED ORDER — 0.9 % SODIUM CHLORIDE (POUR BTL) OPTIME
TOPICAL | Status: DC | PRN
  Administered 2019-02-22: 1000 mL

## 2019-02-22 SURGICAL SUPPLY — 39 items
BAG DECANTER FOR FLEXI CONT (MISCELLANEOUS) ×2 IMPLANT
BENZOIN TINCTURE PRP APPL 2/3 (GAUZE/BANDAGES/DRESSINGS) ×2 IMPLANT
CHLORAPREP W/TINT 10.5 ML (MISCELLANEOUS) ×2 IMPLANT
COVER SURGICAL LIGHT HANDLE (MISCELLANEOUS) ×2 IMPLANT
COVER TRANSDUCER ULTRASND GEL (DRAPE) ×2 IMPLANT
COVER WAND RF STERILE (DRAPES) ×2 IMPLANT
DERMABOND ADVANCED (GAUZE/BANDAGES/DRESSINGS) ×1
DERMABOND ADVANCED .7 DNX12 (GAUZE/BANDAGES/DRESSINGS) ×1 IMPLANT
DRAPE C-ARM 42X72 X-RAY (DRAPES) ×2 IMPLANT
DRAPE CHEST BREAST 15X10 FENES (DRAPES) ×2 IMPLANT
ELECT CAUTERY BLADE 6.4 (BLADE) ×2 IMPLANT
ELECT REM PT RETURN 9FT ADLT (ELECTROSURGICAL) ×2
ELECTRODE REM PT RTRN 9FT ADLT (ELECTROSURGICAL) ×1 IMPLANT
GAUZE 4X4 16PLY RFD (DISPOSABLE) ×2 IMPLANT
GAUZE SPONGE 4X4 12PLY STRL (GAUZE/BANDAGES/DRESSINGS) ×2 IMPLANT
GEL ULTRASOUND 20GR AQUASONIC (MISCELLANEOUS) ×2 IMPLANT
GLOVE SURG SYN 7.5  E (GLOVE) ×1
GLOVE SURG SYN 7.5 E (GLOVE) ×1 IMPLANT
GOWN STRL REUS W/ TWL LRG LVL3 (GOWN DISPOSABLE) ×2 IMPLANT
GOWN STRL REUS W/ TWL XL LVL3 (GOWN DISPOSABLE) ×1 IMPLANT
GOWN STRL REUS W/TWL LRG LVL3 (GOWN DISPOSABLE) ×2
GOWN STRL REUS W/TWL XL LVL3 (GOWN DISPOSABLE) ×1
INTRODUCER 13FR (INTRODUCER) IMPLANT
KIT BASIN OR (CUSTOM PROCEDURE TRAY) ×2 IMPLANT
KIT PORT POWER 8FR ISP CVUE (Port) ×2 IMPLANT
KIT TURNOVER KIT B (KITS) ×2 IMPLANT
NS IRRIG 1000ML POUR BTL (IV SOLUTION) ×2 IMPLANT
PAD ARMBOARD 7.5X6 YLW CONV (MISCELLANEOUS) ×2 IMPLANT
PENCIL BUTTON HOLSTER BLD 10FT (ELECTRODE) ×2 IMPLANT
POSITIONER HEAD DONUT 9IN (MISCELLANEOUS) ×2 IMPLANT
SET SHEATH INTRODUCER 10FR (MISCELLANEOUS) IMPLANT
SHEATH COOK PEEL AWAY SET 9F (SHEATH) IMPLANT
STRIP CLOSURE SKIN 1/2X4 (GAUZE/BANDAGES/DRESSINGS) ×2 IMPLANT
SUT MNCRL AB 4-0 PS2 18 (SUTURE) ×2 IMPLANT
SUT VIC AB 3-0 SH 18 (SUTURE) ×2 IMPLANT
SYR 5ML LUER SLIP (SYRINGE) ×2 IMPLANT
TOWEL GREEN STERILE (TOWEL DISPOSABLE) ×2 IMPLANT
TOWEL GREEN STERILE FF (TOWEL DISPOSABLE) ×2 IMPLANT
TRAY LAPAROSCOPIC MC (CUSTOM PROCEDURE TRAY) ×2 IMPLANT

## 2019-02-22 NOTE — Interval H&P Note (Signed)
History and Physical Interval Note:  02/22/2019 8:12 AM  [name]  has presented today for surgery, with the diagnosis of COLON CANCER.  The various methods of treatment have been discussed with the patient and family.   Her husband is in the lobby.  After consideration of risks, benefits and other options for treatment, the patient has consented to  Procedure(s): POWER PORT PLACEMENT ULTRASOUND ASSISTED (N/A) as a surgical intervention.  The patient's history has been reviewed, patient examined, no change in status, stable for surgery.  I have reviewed the patient's chart and labs.  Questions were answered to the patient's satisfaction.     Shann Medal

## 2019-02-22 NOTE — Transfer of Care (Signed)
Immediate Anesthesia Transfer of Care Note  Patient: [name] [name]  Procedure(s) Performed: POWER PORT PLACEMENT (N/A Chest)  Patient Location: PACU  Anesthesia Type:General  Level of Consciousness: awake, alert  and oriented  Airway & Oxygen Therapy: Patient Spontanous Breathing and Patient connected to face mask oxygen  Post-op Assessment: Report given to RN and Post -op Vital signs reviewed and stable  Post vital signs: Reviewed and stable  Last Vitals:  Vitals Value Taken Time  BP 129/70 02/22/19 0935  Temp 36.1 C 02/22/19 0936  Pulse 68 02/22/19 0938  Resp 14 02/22/19 0938  SpO2 100 % 02/22/19 0938  Vitals shown include unvalidated device data.  Last Pain:  Vitals:   02/22/19 0724  TempSrc:   PainSc: 0-No pain      Patients Stated Pain Goal: 3 ([id number])  Complications: No apparent anesthesia complications

## 2019-02-22 NOTE — Discharge Instructions (Signed)
[facility] - DISCHARGE INSTRUCTIONS TO PATIENT  Activity:  Driving - May drive in two or three days, if doing well   Lifting - No lifting more than 15 pounds for 3 days, then no limit                       Practice you Covid-19 protection:  Wear a mask, social distance, and wash your hands frequently  Wound Care:   Leave the incision dry for 2 days, then you may shower.  Diet:  As tolerated  Follow up appointment:  Call Dr. Pollie Friar office [facility]) at [phone] for an appointment in 6 months.  If there is any question about the wound, let us know.  Medications and dosages:  Resume your home medications.  You have a prescription for:  Ultram  Call Dr. Lucia Gaskins or his office  [phone]) if you have:  Temperature greater than 100.4,  Persistent nausea and vomiting,  Severe uncontrolled pain,  Redness, tenderness, or signs of infection (pain, swelling, redness, odor or green/yellow discharge around the site),  Difficulty breathing, headache or visual disturbances,  Any other questions or concerns you may have after discharge.  In an emergency, call 911 or go to an Emergency Department at a nearby hospital.

## 2019-02-22 NOTE — Anesthesia Procedure Notes (Signed)
Procedure Name: LMA Insertion Date/Time: 02/22/2019 8:37 AM Performed by: Alain Marion, CRNA Pre-anesthesia Checklist: Patient identified, Emergency Drugs available, Suction available and Patient being monitored Patient Re-evaluated:Patient Re-evaluated prior to induction Oxygen Delivery Method: Circle System Utilized Preoxygenation: Pre-oxygenation with 100% oxygen Induction Type: IV induction Ventilation: Mask ventilation without difficulty LMA: LMA inserted LMA Size: 4.0 Number of attempts: 1 Airway Equipment and Method: Bite block Placement Confirmation: positive ETCO2 Tube secured with: Tape Dental Injury: Teeth and Oropharynx as per pre-operative assessment

## 2019-02-22 NOTE — Anesthesia Postprocedure Evaluation (Signed)
Anesthesia Post Note  Patient: [name]  Procedure(s) Performed: POWER PORT PLACEMENT (N/A Chest)     Patient location during evaluation: PACU Anesthesia Type: General Level of consciousness: sedated Pain management: pain level controlled Vital Signs Assessment: post-procedure vital signs reviewed and stable Respiratory status: spontaneous breathing and respiratory function stable Cardiovascular status: stable Postop Assessment: no apparent nausea or vomiting Anesthetic complications: no    Last Vitals:  Vitals:   02/22/19 1045 02/22/19 1050  BP:  (!) 153/68  Pulse: 61 71  Resp: 11 12  Temp:    SpO2: 98% 98%    Last Pain:  Vitals:   02/22/19 1030  TempSrc:   PainSc: 0-No pain                 Jamya Starry DANIEL

## 2019-02-22 NOTE — Op Note (Signed)
02/22/2019  9:28 AM  PATIENT:  [name], 73 y.o., female MRN: [id number] DOB: 01-11-1946  PREOP DIAGNOSIS:  COLON CANCER, anticipate chemotherapy  POSTOP DIAGNOSIS:   Colon cancer, anticipate chemotherapy  PROCEDURE:   Procedure(s):  Left subclavian POWER PORT PLACEMENT  SURGEON:   Alphonsa Overall, M.D.  ANESTHESIA:   general  Anesthesiologist: Duane Boston, MD  General  EBL:  minimal  ml  COUNTS CORRECT:  YES  INDICATIONS FOR PROCEDURE:  [name] is a 73 y.o. (DOB: Apr 29, 1946) white female whose primary care physician is Chesley Noon, MD and comes for power port placement for the treatment of colon cancer.  Dr. Ammie Dalton is her treating oncologist.   The indications and risks of the surgery were explained to the patient.  The risks include, but are not limited to, infection, bleeding, pneumothorax, nerve injury, and thrombosis of the vein.  OPERATIVE NOTE:  The patient was taken to OR room #1 at [facility].  Anesthesia was provided by Anesthesiologist: Duane Boston, MD.  At the beginning of the operation, the patient was given 2 gm Ancef, had a roll placed under her back, and had the upper chest/neck prepped with Chloroprep and draped.   A time out was held and the surgery checklist reviewed.   The patient was placed in Trendelenburg position.  The left subclavian vein was accessed with a 16 gauge needle and a guide wire threaded through the needle into the vein.  The position of the wire was checked with fluoroscopy.   I then developed a pocket in the upper inner aspect of the left chest for the port reservoir.  I used the [employer] for venous access.  The reservoir was sewn in place with a 3-0 Vicryl suture.  The reservoir had been flushed with dilute (10 units/cc) heparin.   I then passed the silastic tubing from the reservoir incision to the subclavian stick site and used the 8 French introducer to pass it into the vein.  The tip  of the silastic catheter was position at the junction of the SVC and the right atrium under fluoroscopy.  The silastic catheter was then attached to the port with the bayonet device.  The entire port and tubing were checked with fluoroscopy.  I injected 10 cc of 1/4% marcaine in the wounds.   The wounds were then closed with 3-0 vicryl subcutaneous sutures and the skin closed with a 4-0 Monocryl suture.  The skin was painted with DermaBond and Steristrips.    The patient is getting chemotherapy tomorrow.  So I accessed the port with a right angle Huber needle.  I then flushed the port with 4 cc of concentrated heparin (100 units/cc).  The wound was sterilely dressed to protect the [facility] needle.   The patient was transferred to the recovery room in good condition.  The sponge and needle count were correct at the end of the case.  A CXR is ordered for port placement and pending at the time of this note.  Alphonsa Overall, MD, [facility] Surgery Pager: [phone] Office phone:  [phone]

## 2019-02-23 ENCOUNTER — Inpatient Hospital Stay: Payer: Medicare Other

## 2019-02-23 ENCOUNTER — Telehealth: Payer: Self-pay | Admitting: Nurse Practitioner

## 2019-02-23 ENCOUNTER — Encounter: Payer: Self-pay | Admitting: Nurse Practitioner

## 2019-02-23 ENCOUNTER — Telehealth: Payer: Self-pay | Admitting: *Deleted

## 2019-02-23 ENCOUNTER — Inpatient Hospital Stay (HOSPITAL_BASED_OUTPATIENT_CLINIC_OR_DEPARTMENT_OTHER): Payer: Medicare Other | Admitting: Nurse Practitioner

## 2019-02-23 ENCOUNTER — Other Ambulatory Visit: Payer: Self-pay

## 2019-02-23 VITALS — BP 138/72 | HR 67 | Temp 98.2°F | Resp 17 | Ht 61.0 in | Wt 192.1 lb

## 2019-02-23 DIAGNOSIS — C182 Malignant neoplasm of ascending colon: Secondary | ICD-10-CM

## 2019-02-23 DIAGNOSIS — Z23 Encounter for immunization: Secondary | ICD-10-CM

## 2019-02-23 DIAGNOSIS — C180 Malignant neoplasm of cecum: Secondary | ICD-10-CM | POA: Diagnosis not present

## 2019-02-23 DIAGNOSIS — Z95828 Presence of other vascular implants and grafts: Secondary | ICD-10-CM | POA: Insufficient documentation

## 2019-02-23 LAB — CMP (CANCER CENTER ONLY)
ALT: 14 U/L (ref 0–44)
AST: 17 U/L (ref 15–41)
Albumin: 3.7 g/dL (ref 3.5–5.0)
Alkaline Phosphatase: 78 U/L (ref 38–126)
Anion gap: 10 (ref 5–15)
BUN: 17 mg/dL (ref 8–23)
CO2: 27 mmol/L (ref 22–32)
Calcium: 9.7 mg/dL (ref 8.9–10.3)
Chloride: 102 mmol/L (ref 98–111)
Creatinine: 1.13 mg/dL — ABNORMAL HIGH (ref 0.44–1.00)
GFR, Est AFR Am: 56 mL/min — ABNORMAL LOW (ref >60)
GFR, Estimated: 48 mL/min — ABNORMAL LOW (ref >60)
Glucose, Bld: 93 mg/dL (ref 70–99)
Potassium: 3.5 mmol/L (ref 3.5–5.1)
Sodium: 139 mmol/L (ref 135–145)
Total Bilirubin: 0.4 mg/dL (ref 0.3–1.2)
Total Protein: 7.4 g/dL (ref 6.5–8.1)

## 2019-02-23 LAB — CEA (IN HOUSE-CHCC): CEA (CHCC-In House): 1.83 ng/mL (ref 0.00–5.00)

## 2019-02-23 LAB — CBC WITH DIFFERENTIAL (CANCER CENTER ONLY)
Abs Immature Granulocytes: 0.04 10*3/uL (ref 0.00–0.07)
Basophils Absolute: 0 10*3/uL (ref 0.0–0.1)
Basophils Relative: 0 %
Eosinophils Absolute: 0 10*3/uL (ref 0.0–0.5)
Eosinophils Relative: 0 %
HCT: 37.1 % (ref 36.0–46.0)
Hemoglobin: 12.1 g/dL (ref 12.0–15.0)
Immature Granulocytes: 0 %
Lymphocytes Relative: 21 %
Lymphs Abs: 2.5 10*3/uL (ref 0.7–4.0)
MCH: 27.9 pg (ref 26.0–34.0)
MCHC: 32.6 g/dL (ref 30.0–36.0)
MCV: 85.7 fL (ref 80.0–100.0)
Monocytes Absolute: 0.7 10*3/uL (ref 0.1–1.0)
Monocytes Relative: 6 %
Neutro Abs: 8.4 10*3/uL — ABNORMAL HIGH (ref 1.7–7.7)
Neutrophils Relative %: 73 %
Platelet Count: 324 10*3/uL (ref 150–400)
RBC: 4.33 MIL/uL (ref 3.87–5.11)
RDW: 16.3 % — ABNORMAL HIGH (ref 11.5–15.5)
WBC Count: 11.7 10*3/uL — ABNORMAL HIGH (ref 4.0–10.5)
nRBC: 0 % (ref 0.0–0.2)

## 2019-02-23 MED ORDER — LEUCOVORIN CALCIUM INJECTION 350 MG
400.0000 mg/m2 | Freq: Once | INTRAVENOUS | Status: AC
  Administered 2019-02-23: 768 mg via INTRAVENOUS
  Filled 2019-02-23: qty 38.4

## 2019-02-23 MED ORDER — DEXTROSE 5 % IV SOLN
Freq: Once | INTRAVENOUS | Status: AC
  Administered 2019-02-23: 10:00:00 via INTRAVENOUS
  Filled 2019-02-23: qty 250

## 2019-02-23 MED ORDER — SODIUM CHLORIDE 0.9% FLUSH
10.0000 mL | INTRAVENOUS | Status: DC | PRN
  Administered 2019-02-23: 10 mL via INTRAVENOUS
  Filled 2019-02-23: qty 10

## 2019-02-23 MED ORDER — DEXTROSE 5 % IV SOLN
Freq: Once | INTRAVENOUS | Status: AC
  Administered 2019-02-23: 12:00:00 via INTRAVENOUS
  Filled 2019-02-23: qty 250

## 2019-02-23 MED ORDER — DEXAMETHASONE SODIUM PHOSPHATE 10 MG/ML IJ SOLN
INTRAMUSCULAR | Status: AC
  Filled 2019-02-23: qty 1

## 2019-02-23 MED ORDER — INFLUENZA VAC A&B SA ADJ QUAD 0.5 ML IM PRSY
0.5000 mL | PREFILLED_SYRINGE | Freq: Once | INTRAMUSCULAR | Status: AC
  Administered 2019-02-23: 13:00:00 0.5 mL via INTRAMUSCULAR

## 2019-02-23 MED ORDER — INFLUENZA VAC SPLIT QUAD 0.5 ML IM SUSY: 0.5000 mL | PREFILLED_SYRINGE | Freq: Once | INTRAMUSCULAR | Status: DC

## 2019-02-23 MED ORDER — FLUOROURACIL CHEMO INJECTION 2.5 GM/50ML
400.0000 mg/m2 | Freq: Once | INTRAVENOUS | Status: AC
  Administered 2019-02-23: 750 mg via INTRAVENOUS
  Filled 2019-02-23: qty 15

## 2019-02-23 MED ORDER — DEXAMETHASONE SODIUM PHOSPHATE 10 MG/ML IJ SOLN
10.0000 mg | Freq: Once | INTRAMUSCULAR | Status: AC
  Administered 2019-02-23: 10 mg via INTRAVENOUS

## 2019-02-23 MED ORDER — SODIUM CHLORIDE 0.9 % IV SOLN
2400.0000 mg/m2 | INTRAVENOUS | Status: DC
  Administered 2019-02-23: 4600 mg via INTRAVENOUS
  Filled 2019-02-23: qty 92

## 2019-02-23 MED ORDER — PALONOSETRON HCL INJECTION 0.25 MG/5ML
0.2500 mg | Freq: Once | INTRAVENOUS | Status: AC
  Administered 2019-02-23: 0.25 mg via INTRAVENOUS

## 2019-02-23 MED ORDER — PALONOSETRON HCL INJECTION 0.25 MG/5ML
INTRAVENOUS | Status: AC
  Filled 2019-02-23: qty 5

## 2019-02-23 MED ORDER — INFLUENZA VAC A&B SA ADJ QUAD 0.5 ML IM PRSY
PREFILLED_SYRINGE | INTRAMUSCULAR | Status: AC
  Filled 2019-02-23: qty 0.5

## 2019-02-23 MED ORDER — SODIUM CHLORIDE 0.9% FLUSH
10.0000 mL | Freq: Once | INTRAVENOUS | Status: DC
  Filled 2019-02-23: qty 10

## 2019-02-23 MED ORDER — OXALIPLATIN CHEMO INJECTION 100 MG/20ML
150.0000 mg | Freq: Once | INTRAVENOUS | Status: AC
  Administered 2019-02-23: 150 mg via INTRAVENOUS
  Filled 2019-02-23: qty 20

## 2019-02-23 NOTE — Telephone Encounter (Signed)
-----   Message from Flo Shanks, RN sent at 02/23/2019  2:28 PM EDT ----- Regarding: Dr. Benay Spice First time chemo First time chemo.

## 2019-02-23 NOTE — Progress Notes (Signed)
Met with [name] in infusion to provided detailed information on available resources at [facility]. Discussed available virtual support groups and provided contact information for social work, nutrition and symptom management clinic. Will follow as needed.

## 2019-02-23 NOTE — Patient Instructions (Addendum)
[facility] Discharge Instructions for Patients Receiving Chemotherapy  Today you received the following chemotherapy agents: oxaliplatin/leucovirn/florouracil.  To help prevent nausea and vomiting after your treatment, we encourage you to take your nausea medication as directed.     If you develop nausea and vomiting that is not controlled by your nausea medication, call the clinic.   BELOW ARE SYMPTOMS THAT SHOULD BE REPORTED IMMEDIATELY:  *FEVER GREATER THAN 100.5 F  *CHILLS WITH OR WITHOUT FEVER  NAUSEA AND VOMITING THAT IS NOT CONTROLLED WITH YOUR NAUSEA MEDICATION  *UNUSUAL SHORTNESS OF BREATH  *UNUSUAL BRUISING OR BLEEDING  TENDERNESS IN MOUTH AND THROAT WITH OR WITHOUT PRESENCE OF ULCERS  *URINARY PROBLEMS  *BOWEL PROBLEMS  UNUSUAL RASH Items with * indicate a potential emergency and should be followed up as soon as possible.  Feel free to call the clinic you have any questions or concerns. The clinic phone number is [phone].  Influenza Virus Vaccine injection What is this medicine? INFLUENZA VIRUS VACCINE (in floo EN zuh VAHY ruhs vak SEEN) helps to reduce the risk of getting influenza also known as the flu. The vaccine only helps protect you against some strains of the flu. This medicine may be used for other purposes; ask your health care provider or pharmacist if you have questions. COMMON BRAND NAME(S): Afluria, Afluria Quadrivalent, Agriflu, Alfuria, FLUAD, Fluarix, Fluarix Quadrivalent, Flublok, Flublok Quadrivalent, FLUCELVAX, Flulaval, Fluvirin, Fluzone, Fluzone High-Dose, Fluzone Intradermal What should I tell my health care provider before I take this medicine? They need to know if you have any of these conditions:  bleeding disorder like hemophilia  fever or infection  Guillain-Barre syndrome or other neurological problems  immune system problems  infection with the human immunodeficiency virus (HIV) or AIDS  low blood  platelet counts  multiple sclerosis  an unusual or allergic reaction to influenza virus vaccine, latex, other medicines, foods, dyes, or preservatives. Different brands of vaccines contain different allergens. Some may contain latex or eggs. Talk to your doctor about your allergies to make sure that you get the right vaccine.  pregnant or trying to get pregnant  breast-feeding How should I use this medicine? This vaccine is for injection into a muscle or under the skin. It is given by a health care professional. A copy of Vaccine Information Statements will be given before each vaccination. Read this sheet carefully each time. The sheet may change frequently. Talk to your healthcare provider to see which vaccines are right for you. Some vaccines should not be used in all age groups. Overdosage: If you think you have taken too much of this medicine contact a poison control center or emergency room at once. NOTE: This medicine is only for you. Do not share this medicine with others. What if I miss a dose? This does not apply. What may interact with this medicine?  chemotherapy or radiation therapy  medicines that lower your immune system like etanercept, anakinra, infliximab, and adalimumab  medicines that treat or prevent blood clots like warfarin  phenytoin  steroid medicines like prednisone or cortisone  theophylline  vaccines This list may not describe all possible interactions. Give your health care provider a list of all the medicines, herbs, non-prescription drugs, or dietary supplements you use. Also tell them if you smoke, drink alcohol, or use illegal drugs. Some items may interact with your medicine. What should I watch for while using this medicine? Report any side effects that do not go away within 3 days to your  doctor or health care professional. Call your health care provider if any unusual symptoms occur within 6 weeks of receiving this vaccine. You may still catch  the flu, but the illness is not usually as bad. You cannot get the flu from the vaccine. The vaccine will not protect against colds or other illnesses that may cause fever. The vaccine is needed every year. What side effects may I notice from receiving this medicine? Side effects that you should report to your doctor or health care professional as soon as possible:  allergic reactions like skin rash, itching or hives, swelling of the face, lips, or tongue Side effects that usually do not require medical attention (report to your doctor or health care professional if they continue or are bothersome):  fever  headache  muscle aches and pains  pain, tenderness, redness, or swelling at the injection site  tiredness This list may not describe all possible side effects. Call your doctor for medical advice about side effects. You may report side effects to FDA at 1-800-FDA-1088. Where should I keep my medicine? The vaccine will be given by a health care professional in a clinic, pharmacy, doctor's office, or other health care setting. You will not be given vaccine doses to store at home. NOTE: This sheet is a summary. It may not cover all possible information. If you have questions about this medicine, talk to your doctor, pharmacist, or health care provider.  2020 Elsevier/Gold Standard (2018-04-19 08:45:43) Oxaliplatin Injection What is this medicine? OXALIPLATIN (ox AL i PLA tin) is a chemotherapy drug. It targets fast dividing cells, like cancer cells, and causes these cells to die. This medicine is used to treat cancers of the colon and rectum, and many other cancers. This medicine may be used for other purposes; ask your health care provider or pharmacist if you have questions. COMMON BRAND NAME(S): Eloxatin What should I tell my health care provider before I take this medicine? They need to know if you have any of these conditions:  kidney disease  an unusual or allergic reaction to  oxaliplatin, other chemotherapy, other medicines, foods, dyes, or preservatives  pregnant or trying to get pregnant  breast-feeding How should I use this medicine? This drug is given as an infusion into a vein. It is administered in a hospital or clinic by a specially trained health care professional. Talk to your pediatrician regarding the use of this medicine in children. Special care may be needed. Overdosage: If you think you have taken too much of this medicine contact a poison control center or emergency room at once. NOTE: This medicine is only for you. Do not share this medicine with others. What if I miss a dose? It is important not to miss a dose. Call your doctor or health care professional if you are unable to keep an appointment. What may interact with this medicine?  medicines to increase blood counts like filgrastim, pegfilgrastim, sargramostim  probenecid  some antibiotics like amikacin, gentamicin, neomycin, polymyxin B, streptomycin, tobramycin  zalcitabine Talk to your doctor or health care professional before taking any of these medicines:  acetaminophen  aspirin  ibuprofen  ketoprofen  naproxen This list may not describe all possible interactions. Give your health care provider a list of all the medicines, herbs, non-prescription drugs, or dietary supplements you use. Also tell them if you smoke, drink alcohol, or use illegal drugs. Some items may interact with your medicine. What should I watch for while using this medicine? Your condition will  be monitored carefully while you are receiving this medicine. You will need important blood work done while you are taking this medicine. This medicine can make you more sensitive to cold. Do not drink cold drinks or use ice. Cover exposed skin before coming in contact with cold temperatures or cold objects. When out in cold weather wear warm clothing and cover your mouth and nose to warm the air that goes into your  lungs. Tell your doctor if you get sensitive to the cold. This drug may make you feel generally unwell. This is not uncommon, as chemotherapy can affect healthy cells as well as cancer cells. Report any side effects. Continue your course of treatment even though you feel ill unless your doctor tells you to stop. In some cases, you may be given additional medicines to help with side effects. Follow all directions for their use. Call your doctor or health care professional for advice if you get a fever, chills or sore throat, or other symptoms of a cold or flu. Do not treat yourself. This drug decreases your body's ability to fight infections. Try to avoid being around people who are sick. This medicine may increase your risk to bruise or bleed. Call your doctor or health care professional if you notice any unusual bleeding. Be careful brushing and flossing your teeth or using a toothpick because you may get an infection or bleed more easily. If you have any dental work done, tell your dentist you are receiving this medicine. Avoid taking products that contain aspirin, acetaminophen, ibuprofen, naproxen, or ketoprofen unless instructed by your doctor. These medicines may hide a fever. Do not become pregnant while taking this medicine. Women should inform their doctor if they wish to become pregnant or think they might be pregnant. There is a potential for serious side effects to an unborn child. Talk to your health care professional or pharmacist for more information. Do not breast-feed an infant while taking this medicine. Call your doctor or health care professional if you get diarrhea. Do not treat yourself. What side effects may I notice from receiving this medicine? Side effects that you should report to your doctor or health care professional as soon as possible:  allergic reactions like skin rash, itching or hives, swelling of the face, lips, or tongue  low blood counts - This drug may decrease  the number of white blood cells, red blood cells and platelets. You may be at increased risk for infections and bleeding.  signs of infection - fever or chills, cough, sore throat, pain or difficulty passing urine  signs of decreased platelets or bleeding - bruising, pinpoint red spots on the skin, black, tarry stools, nosebleeds  signs of decreased red blood cells - unusually weak or tired, fainting spells, lightheadedness  breathing problems  chest pain, pressure  cough  diarrhea  jaw tightness  mouth sores  nausea and vomiting  pain, swelling, redness or irritation at the injection site  pain, tingling, numbness in the hands or feet  problems with balance, talking, walking  redness, blistering, peeling or loosening of the skin, including inside the mouth  trouble passing urine or change in the amount of urine Side effects that usually do not require medical attention (report to your doctor or health care professional if they continue or are bothersome):  changes in vision  constipation  hair loss  loss of appetite  metallic taste in the mouth or changes in taste  stomach pain This list may not describe all  possible side effects. Call your doctor for medical advice about side effects. You may report side effects to FDA at 1-800-FDA-1088. Where should I keep my medicine? This drug is given in a hospital or clinic and will not be stored at home. NOTE: This sheet is a summary. It may not cover all possible information. If you have questions about this medicine, talk to your doctor, pharmacist, or health care provider.  2020 Elsevier/Gold Standard (2007-12-20 17:22:47) Leucovorin injection What is this medicine? LEUCOVORIN (loo koe VOR in) is used to prevent or treat the harmful effects of some medicines. This medicine is used to treat anemia caused by a low amount of folic acid in the body. It is also used with 5-fluorouracil (5-FU) to treat colon cancer. This  medicine may be used for other purposes; ask your health care provider or pharmacist if you have questions. What should I tell my health care provider before I take this medicine? They need to know if you have any of these conditions:  anemia from low levels of vitamin B-12 in the blood  an unusual or allergic reaction to leucovorin, folic acid, other medicines, foods, dyes, or preservatives  pregnant or trying to get pregnant  breast-feeding How should I use this medicine? This medicine is for injection into a muscle or into a vein. It is given by a health care professional in a hospital or clinic setting. Talk to your pediatrician regarding the use of this medicine in children. Special care may be needed. Overdosage: If you think you have taken too much of this medicine contact a poison control center or emergency room at once. NOTE: This medicine is only for you. Do not share this medicine with others. What if I miss a dose? This does not apply. What may interact with this medicine?  capecitabine  fluorouracil  phenobarbital  phenytoin  primidone  trimethoprim-sulfamethoxazole This list may not describe all possible interactions. Give your health care provider a list of all the medicines, herbs, non-prescription drugs, or dietary supplements you use. Also tell them if you smoke, drink alcohol, or use illegal drugs. Some items may interact with your medicine. What should I watch for while using this medicine? Your condition will be monitored carefully while you are receiving this medicine. This medicine may increase the side effects of 5-fluorouracil, 5-FU. Tell your doctor or health care professional if you have diarrhea or mouth sores that do not get better or that get worse. What side effects may I notice from receiving this medicine? Side effects that you should report to your doctor or health care professional as soon as possible:  allergic reactions like skin rash,  itching or hives, swelling of the face, lips, or tongue  breathing problems  fever, infection  mouth sores  unusual bleeding or bruising  unusually weak or tired Side effects that usually do not require medical attention (report to your doctor or health care professional if they continue or are bothersome):  constipation or diarrhea  loss of appetite  nausea, vomiting This list may not describe all possible side effects. Call your doctor for medical advice about side effects. You may report side effects to FDA at 1-800-FDA-1088. Where should I keep my medicine? This drug is given in a hospital or clinic and will not be stored at home. NOTE: This sheet is a summary. It may not cover all possible information. If you have questions about this medicine, talk to your doctor, pharmacist, or health care provider.  2020  Elsevier/Gold Standard (2007-11-29 16:50:29) Fluorouracil, 5-FU injection What is this medicine? FLUOROURACIL, 5-FU (flure oh YOOR a sil) is a chemotherapy drug. It slows the growth of cancer cells. This medicine is used to treat many types of cancer like breast cancer, colon or rectal cancer, pancreatic cancer, and stomach cancer. This medicine may be used for other purposes; ask your health care provider or pharmacist if you have questions. COMMON BRAND NAME(S): Adrucil What should I tell my health care provider before I take this medicine? They need to know if you have any of these conditions:  blood disorders  dihydropyrimidine dehydrogenase (DPD) deficiency  infection (especially a virus infection such as chickenpox, cold sores, or herpes)  kidney disease  liver disease  malnourished, poor nutrition  recent or ongoing radiation therapy  an unusual or allergic reaction to fluorouracil, other chemotherapy, other medicines, foods, dyes, or preservatives  pregnant or trying to get pregnant  breast-feeding How should I use this medicine? This drug is given  as an infusion or injection into a vein. It is administered in a hospital or clinic by a specially trained health care professional. Talk to your pediatrician regarding the use of this medicine in children. Special care may be needed. Overdosage: If you think you have taken too much of this medicine contact a poison control center or emergency room at once. NOTE: This medicine is only for you. Do not share this medicine with others. What if I miss a dose? It is important not to miss your dose. Call your doctor or health care professional if you are unable to keep an appointment. What may interact with this medicine?  allopurinol  cimetidine  dapsone  digoxin  hydroxyurea  leucovorin  levamisole  medicines for seizures like ethotoin, fosphenytoin, phenytoin  medicines to increase blood counts like filgrastim, pegfilgrastim, sargramostim  medicines that treat or prevent blood clots like warfarin, enoxaparin, and dalteparin  methotrexate  metronidazole  pyrimethamine  some other chemotherapy drugs like busulfan, cisplatin, estramustine, vinblastine  trimethoprim  trimetrexate  vaccines Talk to your doctor or health care professional before taking any of these medicines:  acetaminophen  aspirin  ibuprofen  ketoprofen  naproxen This list may not describe all possible interactions. Give your health care provider a list of all the medicines, herbs, non-prescription drugs, or dietary supplements you use. Also tell them if you smoke, drink alcohol, or use illegal drugs. Some items may interact with your medicine. What should I watch for while using this medicine? Visit your doctor for checks on your progress. This drug may make you feel generally unwell. This is not uncommon, as chemotherapy can affect healthy cells as well as cancer cells. Report any side effects. Continue your course of treatment even though you feel ill unless your doctor tells you to stop. In some  cases, you may be given additional medicines to help with side effects. Follow all directions for their use. Call your doctor or health care professional for advice if you get a fever, chills or sore throat, or other symptoms of a cold or flu. Do not treat yourself. This drug decreases your body's ability to fight infections. Try to avoid being around people who are sick. This medicine may increase your risk to bruise or bleed. Call your doctor or health care professional if you notice any unusual bleeding. Be careful brushing and flossing your teeth or using a toothpick because you may get an infection or bleed more easily. If you have any dental work done,  tell your dentist you are receiving this medicine. Avoid taking products that contain aspirin, acetaminophen, ibuprofen, naproxen, or ketoprofen unless instructed by your doctor. These medicines may hide a fever. Do not become pregnant while taking this medicine. Women should inform their doctor if they wish to become pregnant or think they might be pregnant. There is a potential for serious side effects to an unborn child. Talk to your health care professional or pharmacist for more information. Do not breast-feed an infant while taking this medicine. Men should inform their doctor if they wish to father a child. This medicine may lower sperm counts. Do not treat diarrhea with over the counter products. Contact your doctor if you have diarrhea that lasts more than 2 days or if it is severe and watery. This medicine can make you more sensitive to the sun. Keep out of the sun. If you cannot avoid being in the sun, wear protective clothing and use sunscreen. Do not use sun lamps or tanning beds/booths. What side effects may I notice from receiving this medicine? Side effects that you should report to your doctor or health care professional as soon as possible:  allergic reactions like skin rash, itching or hives, swelling of the face, lips, or  tongue  low blood counts - this medicine may decrease the number of white blood cells, red blood cells and platelets. You may be at increased risk for infections and bleeding.  signs of infection - fever or chills, cough, sore throat, pain or difficulty passing urine  signs of decreased platelets or bleeding - bruising, pinpoint red spots on the skin, black, tarry stools, blood in the urine  signs of decreased red blood cells - unusually weak or tired, fainting spells, lightheadedness  breathing problems  changes in vision  chest pain  mouth sores  nausea and vomiting  pain, swelling, redness at site where injected  pain, tingling, numbness in the hands or feet  redness, swelling, or sores on hands or feet  stomach pain  unusual bleeding Side effects that usually do not require medical attention (report to your doctor or health care professional if they continue or are bothersome):  changes in finger or toe nails  diarrhea  dry or itchy skin  hair loss  headache  loss of appetite  sensitivity of eyes to the light  stomach upset  unusually teary eyes This list may not describe all possible side effects. Call your doctor for medical advice about side effects. You may report side effects to FDA at 1-800-FDA-1088. Where should I keep my medicine? This drug is given in a hospital or clinic and will not be stored at home. NOTE: This sheet is a summary. It may not cover all possible information. If you have questions about this medicine, talk to your doctor, pharmacist, or health care provider.  2020 Elsevier/Gold Standard (2007-09-28 13:53:16)

## 2019-02-23 NOTE — Telephone Encounter (Signed)
Informed pt that I would call her tomorrow to check on her & see how she is doing with the chemotherapy.  She states she is doing well at this time.  We discussed pump & how to tell if it is running. She expressed appreciation for call.

## 2019-02-23 NOTE — Telephone Encounter (Signed)
Scheduled appt per 9/17 los. °

## 2019-02-24 ENCOUNTER — Telehealth: Payer: Self-pay | Admitting: *Deleted

## 2019-02-24 NOTE — Telephone Encounter (Signed)
Pt called back & states she has done well so far with her chemotherapy treatment & denies any problems.  She appreciated call & knows to call us with any concerns/questions.  Verified appt tomorrow to get pump d/c @ 12 noon.Marland Kitchen

## 2019-02-24 NOTE — Telephone Encounter (Signed)
-----   Message from Flo Shanks, RN sent at 02/23/2019  2:28 PM EDT ----- Regarding: Dr. Benay Spice First time chemo First time chemo.

## 2019-02-24 NOTE — Telephone Encounter (Signed)
Called & left message on pt's home & mobile # to call back & let us know how she did with her treatment yest.

## 2019-02-25 ENCOUNTER — Inpatient Hospital Stay: Payer: Medicare Other

## 2019-02-25 ENCOUNTER — Other Ambulatory Visit: Payer: Self-pay

## 2019-02-25 VITALS — BP 150/81 | HR 77 | Temp 99.1°F | Resp 17

## 2019-02-25 DIAGNOSIS — Z95828 Presence of other vascular implants and grafts: Secondary | ICD-10-CM

## 2019-02-25 DIAGNOSIS — C180 Malignant neoplasm of cecum: Secondary | ICD-10-CM | POA: Diagnosis not present

## 2019-02-25 DIAGNOSIS — C182 Malignant neoplasm of ascending colon: Secondary | ICD-10-CM

## 2019-02-25 MED ORDER — HEPARIN SOD (PORK) LOCK FLUSH 100 UNIT/ML IV SOLN
500.0000 [IU] | Freq: Once | INTRAVENOUS | Status: AC
  Administered 2019-02-25: 500 [IU]
  Filled 2019-02-25: qty 5

## 2019-02-25 MED ORDER — SODIUM CHLORIDE 0.9% FLUSH
10.0000 mL | Freq: Once | INTRAVENOUS | Status: AC
  Administered 2019-02-25: 10 mL
  Filled 2019-02-25: qty 10

## 2019-03-03 ENCOUNTER — Other Ambulatory Visit: Payer: Self-pay | Admitting: Oncology

## 2019-03-09 ENCOUNTER — Inpatient Hospital Stay (HOSPITAL_BASED_OUTPATIENT_CLINIC_OR_DEPARTMENT_OTHER): Payer: Medicare Other | Admitting: Oncology

## 2019-03-09 ENCOUNTER — Other Ambulatory Visit: Payer: Self-pay

## 2019-03-09 ENCOUNTER — Other Ambulatory Visit: Payer: Self-pay | Admitting: *Deleted

## 2019-03-09 ENCOUNTER — Inpatient Hospital Stay: Payer: Medicare Other | Attending: Nurse Practitioner

## 2019-03-09 ENCOUNTER — Inpatient Hospital Stay: Payer: Medicare Other

## 2019-03-09 VITALS — BP 170/72

## 2019-03-09 VITALS — BP 134/83 | HR 74 | Temp 98.5°F | Resp 18 | Ht 61.0 in | Wt 187.6 lb

## 2019-03-09 DIAGNOSIS — D509 Iron deficiency anemia, unspecified: Secondary | ICD-10-CM | POA: Insufficient documentation

## 2019-03-09 DIAGNOSIS — Z8673 Personal history of transient ischemic attack (TIA), and cerebral infarction without residual deficits: Secondary | ICD-10-CM | POA: Diagnosis not present

## 2019-03-09 DIAGNOSIS — C180 Malignant neoplasm of cecum: Secondary | ICD-10-CM | POA: Diagnosis present

## 2019-03-09 DIAGNOSIS — C182 Malignant neoplasm of ascending colon: Secondary | ICD-10-CM

## 2019-03-09 DIAGNOSIS — Z79899 Other long term (current) drug therapy: Secondary | ICD-10-CM | POA: Diagnosis not present

## 2019-03-09 DIAGNOSIS — I252 Old myocardial infarction: Secondary | ICD-10-CM | POA: Insufficient documentation

## 2019-03-09 DIAGNOSIS — R599 Enlarged lymph nodes, unspecified: Secondary | ICD-10-CM | POA: Diagnosis not present

## 2019-03-09 DIAGNOSIS — I10 Essential (primary) hypertension: Secondary | ICD-10-CM | POA: Insufficient documentation

## 2019-03-09 DIAGNOSIS — Z5111 Encounter for antineoplastic chemotherapy: Secondary | ICD-10-CM | POA: Diagnosis present

## 2019-03-09 LAB — CBC WITH DIFFERENTIAL (CANCER CENTER ONLY)
Abs Immature Granulocytes: 0.01 10*3/uL (ref 0.00–0.07)
Basophils Absolute: 0.1 10*3/uL (ref 0.0–0.1)
Basophils Relative: 1 %
Eosinophils Absolute: 0.2 10*3/uL (ref 0.0–0.5)
Eosinophils Relative: 3 %
HCT: 36.2 % (ref 36.0–46.0)
Hemoglobin: 11.9 g/dL — ABNORMAL LOW (ref 12.0–15.0)
Immature Granulocytes: 0 %
Lymphocytes Relative: 38 %
Lymphs Abs: 2.6 10*3/uL (ref 0.7–4.0)
MCH: 28.7 pg (ref 26.0–34.0)
MCHC: 32.9 g/dL (ref 30.0–36.0)
MCV: 87.4 fL (ref 80.0–100.0)
Monocytes Absolute: 0.5 10*3/uL (ref 0.1–1.0)
Monocytes Relative: 7 %
Neutro Abs: 3.5 10*3/uL (ref 1.7–7.7)
Neutrophils Relative %: 51 %
Platelet Count: 245 10*3/uL (ref 150–400)
RBC: 4.14 MIL/uL (ref 3.87–5.11)
RDW: 15.1 % (ref 11.5–15.5)
WBC Count: 6.8 10*3/uL (ref 4.0–10.5)
nRBC: 0 % (ref 0.0–0.2)

## 2019-03-09 LAB — CMP (CANCER CENTER ONLY)
ALT: 25 U/L (ref 0–44)
AST: 21 U/L (ref 15–41)
Albumin: 3.8 g/dL (ref 3.5–5.0)
Alkaline Phosphatase: 102 U/L (ref 38–126)
Anion gap: 11 (ref 5–15)
BUN: 18 mg/dL (ref 8–23)
CO2: 28 mmol/L (ref 22–32)
Calcium: 9.5 mg/dL (ref 8.9–10.3)
Chloride: 102 mmol/L (ref 98–111)
Creatinine: 1.24 mg/dL — ABNORMAL HIGH (ref 0.44–1.00)
GFR, Est AFR Am: 50 mL/min — ABNORMAL LOW (ref >60)
GFR, Estimated: 43 mL/min — ABNORMAL LOW (ref >60)
Glucose, Bld: 119 mg/dL — ABNORMAL HIGH (ref 70–99)
Potassium: 3.3 mmol/L — ABNORMAL LOW (ref 3.5–5.1)
Sodium: 141 mmol/L (ref 135–145)
Total Bilirubin: 0.2 mg/dL — ABNORMAL LOW (ref 0.3–1.2)
Total Protein: 7.6 g/dL (ref 6.5–8.1)

## 2019-03-09 MED ORDER — ALTEPLASE 2 MG IJ SOLR
INTRAMUSCULAR | Status: AC
  Filled 2019-03-09: qty 2

## 2019-03-09 MED ORDER — DEXTROSE 5 % IV SOLN
Freq: Once | INTRAVENOUS | Status: AC
  Administered 2019-03-09: 12:00:00 via INTRAVENOUS
  Filled 2019-03-09: qty 250

## 2019-03-09 MED ORDER — FLUOROURACIL CHEMO INJECTION 2.5 GM/50ML
400.0000 mg/m2 | Freq: Once | INTRAVENOUS | Status: AC
  Administered 2019-03-09: 750 mg via INTRAVENOUS
  Filled 2019-03-09: qty 15

## 2019-03-09 MED ORDER — OXALIPLATIN CHEMO INJECTION 100 MG/20ML
78.0000 mg/m2 | Freq: Once | INTRAVENOUS | Status: AC
  Administered 2019-03-09: 150 mg via INTRAVENOUS
  Filled 2019-03-09: qty 20

## 2019-03-09 MED ORDER — SODIUM CHLORIDE 0.9 % IV SOLN
2400.0000 mg/m2 | INTRAVENOUS | Status: DC
  Administered 2019-03-09: 4600 mg via INTRAVENOUS
  Filled 2019-03-09: qty 92

## 2019-03-09 MED ORDER — POTASSIUM CHLORIDE ER 10 MEQ PO TBCR: 10.0000 meq | EXTENDED_RELEASE_TABLET | Freq: Every day | ORAL | 1 refills | Status: DC

## 2019-03-09 MED ORDER — DEXAMETHASONE SODIUM PHOSPHATE 10 MG/ML IJ SOLN
INTRAMUSCULAR | Status: AC
  Filled 2019-03-09: qty 1

## 2019-03-09 MED ORDER — ALTEPLASE 2 MG IJ SOLR
2.0000 mg | Freq: Once | INTRAMUSCULAR | Status: AC | PRN
  Administered 2019-03-09: 2 mg
  Filled 2019-03-09: qty 2

## 2019-03-09 MED ORDER — PALONOSETRON HCL INJECTION 0.25 MG/5ML
0.2500 mg | Freq: Once | INTRAVENOUS | Status: AC
  Administered 2019-03-09: 0.25 mg via INTRAVENOUS

## 2019-03-09 MED ORDER — PALONOSETRON HCL INJECTION 0.25 MG/5ML
INTRAVENOUS | Status: AC
  Filled 2019-03-09: qty 5

## 2019-03-09 MED ORDER — DEXAMETHASONE SODIUM PHOSPHATE 10 MG/ML IJ SOLN
10.0000 mg | Freq: Once | INTRAMUSCULAR | Status: AC
  Administered 2019-03-09: 12:00:00 10 mg via INTRAVENOUS

## 2019-03-09 MED ORDER — LEUCOVORIN CALCIUM INJECTION 350 MG
400.0000 mg/m2 | Freq: Once | INTRAVENOUS | Status: AC
  Administered 2019-03-09: 768 mg via INTRAVENOUS
  Filled 2019-03-09: qty 38.4

## 2019-03-09 NOTE — Progress Notes (Signed)
Per Dr. Benay Spice: OK to treat w/low K+ today--will call in script for home

## 2019-03-09 NOTE — Patient Instructions (Signed)
[facility] Discharge Instructions for Patients Receiving Chemotherapy  Today you received the following chemotherapy agents: oxaliplatin/leucovirn/florouracil.  To help prevent nausea and vomiting after your treatment, we encourage you to take your nausea medication as directed.     If you develop nausea and vomiting that is not controlled by your nausea medication, call the clinic.   BELOW ARE SYMPTOMS THAT SHOULD BE REPORTED IMMEDIATELY:  *FEVER GREATER THAN 100.5 F  *CHILLS WITH OR WITHOUT FEVER  NAUSEA AND VOMITING THAT IS NOT CONTROLLED WITH YOUR NAUSEA MEDICATION  *UNUSUAL SHORTNESS OF BREATH  *UNUSUAL BRUISING OR BLEEDING  TENDERNESS IN MOUTH AND THROAT WITH OR WITHOUT PRESENCE OF ULCERS  *URINARY PROBLEMS  *BOWEL PROBLEMS  UNUSUAL RASH Items with * indicate a potential emergency and should be followed up as soon as possible.  Feel free to call the clinic you have any questions or concerns. The clinic phone number is [phone].

## 2019-03-09 NOTE — Patient Instructions (Signed)

## 2019-03-09 NOTE — Progress Notes (Signed)
[place] OFFICE PROGRESS NOTE   Diagnosis: Colon cancer  INTERVAL HISTORY:   Ms. Hearty completed cycle 1 FOLFOX on 02/23/2019.  No nausea/vomiting.  She had diarrhea for several days following chemotherapy, up to 3 times per day.  The diarrhea improved with Imodium.  She had transient numbness in the right fifth finger yesterday.  This has resolved.  She reports a throbbing pain in the lateral left upper and lower leg yesterday.  No pain at present.  No back pain.  No leg swelling.  She had mouth sores following chemotherapy.  These have resolved.  Objective:  Vital signs in last 24 hours:  Blood pressure 134/83, pulse 74, temperature 98.5 F (36.9 C), temperature source Temporal, resp. rate 18, height _0  (1.549 m), weight 187 lb 9.6 oz (85.1 kg), SpO2 100 %.   Limited physical examination secondary to distancing with the COVID pandemic HEENT: No thrush or ulcers GI: No hepatomegaly, nontender Vascular: No leg edema or erythema Musculoskeletal: No pain with motion of the left hip.  No tenderness at the lower back or left thigh  Portacath/PICC-without erythema  Lab Results:  Lab Results  Component Value Date   WBC 6.8 03/09/2019   HGB 11.9 (L) 03/09/2019   HCT 36.2 03/09/2019   MCV 87.4 03/09/2019   PLT 245 03/09/2019   NEUTROABS 3.5 03/09/2019    CMP  Lab Results  Component Value Date   NA 141 03/09/2019   K 3.3 (L) 03/09/2019   CL 102 03/09/2019   CO2 28 03/09/2019   GLUCOSE 119 (H) 03/09/2019   BUN 18 03/09/2019   CREATININE 1.24 (H) 03/09/2019   CALCIUM 9.5 03/09/2019   PROT 7.6 03/09/2019   ALBUMIN 3.8 03/09/2019   AST 21 03/09/2019   ALT 25 03/09/2019   ALKPHOS 102 03/09/2019   BILITOT 0.2 (L) 03/09/2019   GFRNONAA 43 (L) 03/09/2019   GFRAA 50 (L) 03/09/2019    Lab Results  Component Value Date   CEA1 1.83 02/23/2019    Medications: I have reviewed the patient's current medications.   Assessment/Plan: 1. Colon cancer (T3,  N1b)  Colonoscopy 11/24/2018-large 5 cm malignant appearing fungating mass encompassing the ileocecal valve was identified.  Pathology showed invasive adenocarcinoma, moderately differentiated.    CT abdomen/pelvis 12/07/2018-eccentric wall thickening in the cecum, around the ileocecal valve, compatible with findings reported at the recent colonoscopy.  Borderline enlarged adjacent lymph nodes in the ileocolic mesentery.  No evidence for liver metastases.    CEA 01/13/2019-12.7  CEA 02/23/19 postop/at start of adjuvant chemo - 1.83  01/17/2019 right hemicolectomy by Dr. Lucia Gaskins.  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  Declined genetic testing on 02/20/19, patient indicated she may reconsider after completing adjuvant treatment. Seen by Roma Kayser  [facility] placement per Dr. Lucia Gaskins 02/22/2019   Cycle 1 adjuvant FOLFOX 02/23/19   Cycle 2 adjuvant FOLFOX 03/09/2019  2. Iron deficiency anemia 3. Hypertension 4. History of MI status post stent placement 5. History of stroke 1996   Disposition: Ms. Vannest tolerated the first cycle of FOLFOX well.  She will complete cycle 2 today.  The potassium is mildly low.  This is secondary to HCTZ and diarrhea.  She will begin a potassium supplement.  The etiology of the leg pain is  unclear.  This is likely secondary to a benign musculoskeletal condition.  I have a low clinical suspicion for a DVT.  Ms. Standre will return for an office visit and chemotherapy in 2 weeks.  Betsy Coder, MD  03/09/2019  11:24 AM

## 2019-03-10 ENCOUNTER — Telehealth: Payer: Self-pay | Admitting: Oncology

## 2019-03-10 NOTE — Telephone Encounter (Signed)
Called and left msg. Mailed printout  °

## 2019-03-11 ENCOUNTER — Inpatient Hospital Stay: Payer: Medicare Other

## 2019-03-11 ENCOUNTER — Other Ambulatory Visit: Payer: Self-pay

## 2019-03-11 VITALS — BP 156/98 | HR 91 | Temp 99.1°F | Resp 18

## 2019-03-11 DIAGNOSIS — Z5111 Encounter for antineoplastic chemotherapy: Secondary | ICD-10-CM | POA: Diagnosis not present

## 2019-03-11 DIAGNOSIS — C182 Malignant neoplasm of ascending colon: Secondary | ICD-10-CM

## 2019-03-11 MED ORDER — SODIUM CHLORIDE 0.9% FLUSH
10.0000 mL | INTRAVENOUS | Status: DC | PRN
  Administered 2019-03-11: 10 mL
  Filled 2019-03-11: qty 10

## 2019-03-11 MED ORDER — HEPARIN SOD (PORK) LOCK FLUSH 100 UNIT/ML IV SOLN
500.0000 [IU] | Freq: Once | INTRAVENOUS | Status: AC | PRN
  Administered 2019-03-11: 500 [IU]
  Filled 2019-03-11: qty 5

## 2019-03-11 NOTE — Patient Instructions (Signed)

## 2019-03-13 ENCOUNTER — Telehealth: Payer: Self-pay | Admitting: *Deleted

## 2019-03-13 NOTE — Telephone Encounter (Signed)
Wanted MD aware that for several hours after her treatment on 03/09/2019 her gait was impaired--stumbling. Her voice also turned very raspy and almost hoarse, "like someone in a horror movie". She developed nausea and felt lightheaded for 24 hours. Her left ankle feels like there is a pack of ice wrapped around it. Describes this as very unsettling for her and said "I can't feel like this after my treatment. I won't take it if this could happen again".  Became very tearful on phone with nurse. Attempted to reassure her that this was all temporary potential side effect from the oxaliplatin and will go away. We can't predict if it will happen again--it could possibly. MD notified.

## 2019-03-19 ENCOUNTER — Other Ambulatory Visit: Payer: Self-pay | Admitting: Oncology

## 2019-03-23 ENCOUNTER — Inpatient Hospital Stay: Payer: Medicare Other

## 2019-03-23 ENCOUNTER — Inpatient Hospital Stay (HOSPITAL_BASED_OUTPATIENT_CLINIC_OR_DEPARTMENT_OTHER): Payer: Medicare Other | Admitting: Nurse Practitioner

## 2019-03-23 ENCOUNTER — Other Ambulatory Visit: Payer: Self-pay

## 2019-03-23 ENCOUNTER — Encounter: Payer: Self-pay | Admitting: Nurse Practitioner

## 2019-03-23 VITALS — BP 121/76 | HR 92 | Temp 98.3°F | Resp 18 | Ht 61.0 in | Wt 183.9 lb

## 2019-03-23 DIAGNOSIS — C182 Malignant neoplasm of ascending colon: Secondary | ICD-10-CM | POA: Diagnosis not present

## 2019-03-23 DIAGNOSIS — Z95828 Presence of other vascular implants and grafts: Secondary | ICD-10-CM

## 2019-03-23 DIAGNOSIS — Z5111 Encounter for antineoplastic chemotherapy: Secondary | ICD-10-CM | POA: Diagnosis not present

## 2019-03-23 LAB — CBC WITH DIFFERENTIAL (CANCER CENTER ONLY)
Abs Immature Granulocytes: 0.01 10*3/uL (ref 0.00–0.07)
Basophils Absolute: 0.1 10*3/uL (ref 0.0–0.1)
Basophils Relative: 2 %
Eosinophils Absolute: 0 10*3/uL (ref 0.0–0.5)
Eosinophils Relative: 1 %
HCT: 35.8 % — ABNORMAL LOW (ref 36.0–46.0)
Hemoglobin: 12 g/dL (ref 12.0–15.0)
Immature Granulocytes: 0 %
Lymphocytes Relative: 49 %
Lymphs Abs: 2.3 10*3/uL (ref 0.7–4.0)
MCH: 29 pg (ref 26.0–34.0)
MCHC: 33.5 g/dL (ref 30.0–36.0)
MCV: 86.5 fL (ref 80.0–100.0)
Monocytes Absolute: 0.6 10*3/uL (ref 0.1–1.0)
Monocytes Relative: 13 %
Neutro Abs: 1.7 10*3/uL (ref 1.7–7.7)
Neutrophils Relative %: 35 %
Platelet Count: 201 10*3/uL (ref 150–400)
RBC: 4.14 MIL/uL (ref 3.87–5.11)
RDW: 15.3 % (ref 11.5–15.5)
WBC Count: 4.7 10*3/uL (ref 4.0–10.5)
nRBC: 0 % (ref 0.0–0.2)

## 2019-03-23 LAB — CMP (CANCER CENTER ONLY)
ALT: 26 U/L (ref 0–44)
AST: 24 U/L (ref 15–41)
Albumin: 3.9 g/dL (ref 3.5–5.0)
Alkaline Phosphatase: 100 U/L (ref 38–126)
Anion gap: 12 (ref 5–15)
BUN: 16 mg/dL (ref 8–23)
CO2: 28 mmol/L (ref 22–32)
Calcium: 10.1 mg/dL (ref 8.9–10.3)
Chloride: 99 mmol/L (ref 98–111)
Creatinine: 1.35 mg/dL — ABNORMAL HIGH (ref 0.44–1.00)
GFR, Est AFR Am: 45 mL/min — ABNORMAL LOW (ref >60)
GFR, Estimated: 39 mL/min — ABNORMAL LOW (ref >60)
Glucose, Bld: 110 mg/dL — ABNORMAL HIGH (ref 70–99)
Potassium: 3.4 mmol/L — ABNORMAL LOW (ref 3.5–5.1)
Sodium: 139 mmol/L (ref 135–145)
Total Bilirubin: 0.7 mg/dL (ref 0.3–1.2)
Total Protein: 7.7 g/dL (ref 6.5–8.1)

## 2019-03-23 MED ORDER — DEXAMETHASONE SODIUM PHOSPHATE 10 MG/ML IJ SOLN
10.0000 mg | Freq: Once | INTRAMUSCULAR | Status: AC
  Administered 2019-03-23: 10 mg via INTRAVENOUS

## 2019-03-23 MED ORDER — ALTEPLASE 2 MG IJ SOLR
2.0000 mg | Freq: Once | INTRAMUSCULAR | Status: AC
  Administered 2019-03-23: 2 mg
  Filled 2019-03-23: qty 2

## 2019-03-23 MED ORDER — OXALIPLATIN CHEMO INJECTION 100 MG/20ML
65.0000 mg/m2 | Freq: Once | INTRAVENOUS | Status: AC
  Administered 2019-03-23: 125 mg via INTRAVENOUS
  Filled 2019-03-23: qty 20

## 2019-03-23 MED ORDER — SODIUM CHLORIDE 0.9% FLUSH
10.0000 mL | INTRAVENOUS | Status: DC | PRN
  Filled 2019-03-23: qty 10

## 2019-03-23 MED ORDER — ALTEPLASE 2 MG IJ SOLR
INTRAMUSCULAR | Status: AC
  Filled 2019-03-23: qty 2

## 2019-03-23 MED ORDER — FLUOROURACIL CHEMO INJECTION 2.5 GM/50ML
400.0000 mg/m2 | Freq: Once | INTRAVENOUS | Status: AC
  Administered 2019-03-23: 750 mg via INTRAVENOUS
  Filled 2019-03-23: qty 15

## 2019-03-23 MED ORDER — PALONOSETRON HCL INJECTION 0.25 MG/5ML
0.2500 mg | Freq: Once | INTRAVENOUS | Status: AC
  Administered 2019-03-23: 0.25 mg via INTRAVENOUS

## 2019-03-23 MED ORDER — DEXTROSE 5 % IV SOLN
Freq: Once | INTRAVENOUS | Status: AC
  Administered 2019-03-23: 11:00:00 via INTRAVENOUS
  Filled 2019-03-23: qty 250

## 2019-03-23 MED ORDER — DEXAMETHASONE SODIUM PHOSPHATE 10 MG/ML IJ SOLN
INTRAMUSCULAR | Status: AC
  Filled 2019-03-23: qty 1

## 2019-03-23 MED ORDER — SODIUM CHLORIDE 0.9% FLUSH
10.0000 mL | Freq: Once | INTRAVENOUS | Status: AC
  Administered 2019-03-23: 10 mL
  Filled 2019-03-23: qty 10

## 2019-03-23 MED ORDER — PALONOSETRON HCL INJECTION 0.25 MG/5ML
INTRAVENOUS | Status: AC
  Filled 2019-03-23: qty 5

## 2019-03-23 MED ORDER — LEUCOVORIN CALCIUM INJECTION 350 MG
400.0000 mg/m2 | Freq: Once | INTRAVENOUS | Status: AC
  Administered 2019-03-23: 768 mg via INTRAVENOUS
  Filled 2019-03-23: qty 38.4

## 2019-03-23 MED ORDER — HEPARIN SOD (PORK) LOCK FLUSH 100 UNIT/ML IV SOLN
500.0000 [IU] | Freq: Once | INTRAVENOUS | Status: DC | PRN
  Filled 2019-03-23: qty 5

## 2019-03-23 MED ORDER — SODIUM CHLORIDE 0.9 % IV SOLN
2400.0000 mg/m2 | INTRAVENOUS | Status: DC
  Administered 2019-03-23: 4600 mg via INTRAVENOUS
  Filled 2019-03-23: qty 92

## 2019-03-23 NOTE — Progress Notes (Signed)
[place] OFFICE PROGRESS NOTE   Diagnosis: Colon cancer  INTERVAL HISTORY:   [name] returns as scheduled.  She completed cycle 2 FOLFOX 03/09/2019.  She had nausea on day 1.  She developed ataxia and speech difficulty upon returning home.  Symptoms had resolved by the next morning.  She has mild persistent cold sensitivity.  No numbness or tingling in the absence of cold exposure.  No diarrhea. No mouth sores though she does note some gum sensitivity.  Objective:  Vital signs in last 24 hours:  Blood pressure 121/76, pulse 92, temperature 98.3 F (36.8 C), temperature source Temporal, resp. rate 18, height 5' 1"  (1.549 m), weight 183 lb 14.4 oz (83.4 kg), SpO2 100 %.    HEENT: No thrush or ulcers. GI: Abdomen soft and nontender.  No hepatomegaly. Vascular: No leg edema. Neuro: Vibratory sense minimally decreased over the fingertips per tuning fork exam. Skin: Palms without erythema. Port-A-Cath without erythema.   Lab Results:  Lab Results  Component Value Date   WBC 6.8 03/09/2019   HGB 11.9 (L) 03/09/2019   HCT 36.2 03/09/2019   MCV 87.4 03/09/2019   PLT 245 03/09/2019   NEUTROABS 3.5 03/09/2019    Imaging:  No results found.  Medications: I have reviewed the patient's current medications.  Assessment/Plan: 1. Colon cancer(T3, N1b)  Colonoscopy 11/24/2018-large 5 cm malignant appearing fungating mass encompassing the ileocecal valve was identified. Pathology showed invasive adenocarcinoma, moderately differentiated.   CT abdomen/pelvis 12/07/2018-eccentric wall thickening in the cecum, around the ileocecal valve, compatible with findings reported at the recent colonoscopy. Borderline enlarged adjacent lymph nodes in the ileocolic mesentery. No evidence for liver metastases.   CEA 01/13/2019-12.7  CEA 02/23/19 postop/at start of adjuvant chemo - 1.83  01/17/2019 right hemicolectomy by Dr. Lucia Gaskins. Final pathology showed invasive colonic  adenocarcinoma, 3.3 cm; tumor invades through the muscularis propria into pericolonic tissue; margins not involved; metastatic carcinoma present in 2 of 18 lymph nodes; lymphovascular invasion present; perineural invasion not identified; mismatch repair protein (IHC) normal; MSI stable.  Declined genetic testing on 02/20/19, patient indicated she may reconsider after completing adjuvant treatment. Seen by Roma Kayser  PAC placement per Dr. Lucia Gaskins 02/22/2019   Cycle 1 adjuvant FOLFOX 02/23/19   Cycle 2 adjuvant FOLFOX 03/09/2019  Cycle 3 adjuvant FOLFOX 03/23/2019 (oxaliplatin dose reduced due to low normal neutrophil count)  2. Iron deficiency anemia 3. Hypertension 4. History of MI status post stent placement 5. History of stroke 1996   Disposition: [name] appears stable.  She has completed 2 cycles of FOLFOX.  Plan to proceed with cycle 3 today as scheduled.  The oxaliplatin will be dose reduced to 65 mg/m with this cycle due to absolute neutrophil count in the low normal range.  We will plan to add Udenyca with cycle 4 and escalate the Oxaliplatin dose back to 85 mg/m.  Neutropenic precautions reviewed.  She understands to contact the office with fever, chills, other signs of infection.  She had ataxia and speech difficulty after cycle 2.  She understands this was most likely due to the Oxaliplatin and could potentially happen again.  She will contact the office if symptoms recur.  She will return for lab, follow-up, cycle 4 FOLFOX in 2 weeks.  She will contact the office in the interim as outlined above or with any other problems.  Plan reviewed with Dr. Benay Spice.  25 minutes were spent face-to-face at today's visit with the majority of that time involved in  counseling/coordination of care.    Ned Card ANP/GNP-BC   03/23/2019  10:24 AM

## 2019-03-23 NOTE — Progress Notes (Signed)
Was able to get little blood return from port but not enough for labs. Labs drawn from arm by chasidy LPN cathflo administered by [employer] RN

## 2019-03-23 NOTE — Patient Instructions (Signed)
[place] Discharge Instructions for Patients Receiving Chemotherapy  Today you received the following chemotherapy agents: oxaliplatin/leucovirn/florouracil.  To help prevent nausea and vomiting after your treatment, we encourage you to take your nausea medication as directed.     If you develop nausea and vomiting that is not controlled by your nausea medication, call the clinic.   BELOW ARE SYMPTOMS THAT SHOULD BE REPORTED IMMEDIATELY:  *FEVER GREATER THAN 100.5 F  *CHILLS WITH OR WITHOUT FEVER  NAUSEA AND VOMITING THAT IS NOT CONTROLLED WITH YOUR NAUSEA MEDICATION  *UNUSUAL SHORTNESS OF BREATH  *UNUSUAL BRUISING OR BLEEDING  TENDERNESS IN MOUTH AND THROAT WITH OR WITHOUT PRESENCE OF ULCERS  *URINARY PROBLEMS  *BOWEL PROBLEMS  UNUSUAL RASH Items with * indicate a potential emergency and should be followed up as soon as possible.  Feel free to call the clinic you have any questions or concerns. The clinic phone number is [phone].

## 2019-03-25 ENCOUNTER — Inpatient Hospital Stay: Payer: Medicare Other

## 2019-03-25 ENCOUNTER — Other Ambulatory Visit: Payer: Self-pay

## 2019-03-25 VITALS — BP 124/74 | HR 83 | Temp 99.1°F | Resp 18

## 2019-03-25 DIAGNOSIS — Z5111 Encounter for antineoplastic chemotherapy: Secondary | ICD-10-CM | POA: Diagnosis not present

## 2019-03-25 DIAGNOSIS — C182 Malignant neoplasm of ascending colon: Secondary | ICD-10-CM

## 2019-03-25 MED ORDER — SODIUM CHLORIDE 0.9% FLUSH
10.0000 mL | INTRAVENOUS | Status: DC | PRN
  Administered 2019-03-25: 10 mL
  Filled 2019-03-25: qty 10

## 2019-03-25 MED ORDER — HEPARIN SOD (PORK) LOCK FLUSH 100 UNIT/ML IV SOLN
500.0000 [IU] | Freq: Once | INTRAVENOUS | Status: AC | PRN
  Administered 2019-03-25: 500 [IU]
  Filled 2019-03-25: qty 5

## 2019-03-25 NOTE — Patient Instructions (Signed)

## 2019-04-01 ENCOUNTER — Other Ambulatory Visit: Payer: Self-pay | Admitting: Oncology

## 2019-04-06 ENCOUNTER — Other Ambulatory Visit: Payer: Self-pay

## 2019-04-06 ENCOUNTER — Inpatient Hospital Stay: Payer: Medicare Other

## 2019-04-06 ENCOUNTER — Inpatient Hospital Stay (HOSPITAL_BASED_OUTPATIENT_CLINIC_OR_DEPARTMENT_OTHER): Payer: Medicare Other | Admitting: Oncology

## 2019-04-06 VITALS — BP 117/72 | HR 92 | Temp 98.7°F | Resp 17 | Ht 61.0 in | Wt 180.9 lb

## 2019-04-06 DIAGNOSIS — C182 Malignant neoplasm of ascending colon: Secondary | ICD-10-CM

## 2019-04-06 DIAGNOSIS — Z95828 Presence of other vascular implants and grafts: Secondary | ICD-10-CM

## 2019-04-06 DIAGNOSIS — Z5111 Encounter for antineoplastic chemotherapy: Secondary | ICD-10-CM | POA: Diagnosis not present

## 2019-04-06 LAB — CBC WITH DIFFERENTIAL (CANCER CENTER ONLY)
Abs Immature Granulocytes: 0 10*3/uL (ref 0.00–0.07)
Basophils Absolute: 0.1 10*3/uL (ref 0.0–0.1)
Basophils Relative: 1 %
Eosinophils Absolute: 0 10*3/uL (ref 0.0–0.5)
Eosinophils Relative: 1 %
HCT: 36 % (ref 36.0–46.0)
Hemoglobin: 12.2 g/dL (ref 12.0–15.0)
Immature Granulocytes: 0 %
Lymphocytes Relative: 39 %
Lymphs Abs: 1.8 10*3/uL (ref 0.7–4.0)
MCH: 28.9 pg (ref 26.0–34.0)
MCHC: 33.9 g/dL (ref 30.0–36.0)
MCV: 85.3 fL (ref 80.0–100.0)
Monocytes Absolute: 0.6 10*3/uL (ref 0.1–1.0)
Monocytes Relative: 12 %
Neutro Abs: 2.2 10*3/uL (ref 1.7–7.7)
Neutrophils Relative %: 47 %
Platelet Count: 133 10*3/uL — ABNORMAL LOW (ref 150–400)
RBC: 4.22 MIL/uL (ref 3.87–5.11)
RDW: 17.2 % — ABNORMAL HIGH (ref 11.5–15.5)
WBC Count: 4.6 10*3/uL (ref 4.0–10.5)
nRBC: 0 % (ref 0.0–0.2)

## 2019-04-06 LAB — CMP (CANCER CENTER ONLY)
ALT: 59 U/L — ABNORMAL HIGH (ref 0–44)
AST: 47 U/L — ABNORMAL HIGH (ref 15–41)
Albumin: 3.7 g/dL (ref 3.5–5.0)
Alkaline Phosphatase: 93 U/L (ref 38–126)
Anion gap: 13 (ref 5–15)
BUN: 19 mg/dL (ref 8–23)
CO2: 24 mmol/L (ref 22–32)
Calcium: 10.4 mg/dL — ABNORMAL HIGH (ref 8.9–10.3)
Chloride: 100 mmol/L (ref 98–111)
Creatinine: 1.38 mg/dL — ABNORMAL HIGH (ref 0.44–1.00)
GFR, Est AFR Am: 44 mL/min — ABNORMAL LOW (ref >60)
GFR, Estimated: 38 mL/min — ABNORMAL LOW (ref >60)
Glucose, Bld: 120 mg/dL — ABNORMAL HIGH (ref 70–99)
Potassium: 3.4 mmol/L — ABNORMAL LOW (ref 3.5–5.1)
Sodium: 137 mmol/L (ref 135–145)
Total Bilirubin: 0.7 mg/dL (ref 0.3–1.2)
Total Protein: 7.6 g/dL (ref 6.5–8.1)

## 2019-04-06 MED ORDER — DEXTROSE 5 % IV SOLN
Freq: Once | INTRAVENOUS | Status: AC
  Administered 2019-04-06: 12:00:00 via INTRAVENOUS
  Filled 2019-04-06: qty 250

## 2019-04-06 MED ORDER — DEXAMETHASONE SODIUM PHOSPHATE 10 MG/ML IJ SOLN
INTRAMUSCULAR | Status: AC
  Filled 2019-04-06: qty 1

## 2019-04-06 MED ORDER — FLUOROURACIL CHEMO INJECTION 2.5 GM/50ML
400.0000 mg/m2 | Freq: Once | INTRAVENOUS | Status: AC
  Administered 2019-04-06: 750 mg via INTRAVENOUS
  Filled 2019-04-06: qty 15

## 2019-04-06 MED ORDER — PALONOSETRON HCL INJECTION 0.25 MG/5ML
INTRAVENOUS | Status: AC
  Filled 2019-04-06: qty 5

## 2019-04-06 MED ORDER — SODIUM CHLORIDE 0.9 % IV SOLN
2400.0000 mg/m2 | INTRAVENOUS | Status: DC
  Administered 2019-04-06: 4600 mg via INTRAVENOUS
  Filled 2019-04-06: qty 92

## 2019-04-06 MED ORDER — OXALIPLATIN CHEMO INJECTION 100 MG/20ML
65.0000 mg/m2 | Freq: Once | INTRAVENOUS | Status: AC
  Administered 2019-04-06: 125 mg via INTRAVENOUS
  Filled 2019-04-06: qty 20

## 2019-04-06 MED ORDER — DEXAMETHASONE SODIUM PHOSPHATE 10 MG/ML IJ SOLN
10.0000 mg | Freq: Once | INTRAMUSCULAR | Status: AC
  Administered 2019-04-06: 10 mg via INTRAVENOUS

## 2019-04-06 MED ORDER — DEXTROSE 5 % IV SOLN
Freq: Once | INTRAVENOUS | Status: AC
  Administered 2019-04-06: 13:00:00 via INTRAVENOUS
  Filled 2019-04-06: qty 250

## 2019-04-06 MED ORDER — PALONOSETRON HCL INJECTION 0.25 MG/5ML
0.2500 mg | Freq: Once | INTRAVENOUS | Status: AC
  Administered 2019-04-06: 0.25 mg via INTRAVENOUS

## 2019-04-06 MED ORDER — LEUCOVORIN CALCIUM INJECTION 350 MG
400.0000 mg/m2 | Freq: Once | INTRAVENOUS | Status: AC
  Administered 2019-04-06: 768 mg via INTRAVENOUS
  Filled 2019-04-06: qty 38.4

## 2019-04-06 MED ORDER — OXALIPLATIN CHEMO INJECTION 100 MG/20ML
85.0000 mg/m2 | Freq: Once | INTRAVENOUS | Status: DC
  Filled 2019-04-06: qty 33

## 2019-04-06 MED ORDER — SODIUM CHLORIDE 0.9% FLUSH
10.0000 mL | Freq: Once | INTRAVENOUS | Status: AC
  Administered 2019-04-06: 10 mL
  Filled 2019-04-06: qty 10

## 2019-04-06 NOTE — Progress Notes (Signed)
[place] OFFICE PROGRESS NOTE   Diagnosis: Colon cancer  INTERVAL HISTORY:   Ms. [name] completed another cycle of FOLFOX on 03/23/2019.  She had gait ataxia approximately 1.5 hours following chemotherapy.  This resolved.  She has a burning sensation in the fingers.  No other neuropathy symptoms.  She has noticed her eyes are watering.  Minimal nausea following chemotherapy.  She did not have speech difficulty following the last cycle of chemotherapy.  Objective:  Vital signs in last 24 hours:  Blood pressure 117/72, pulse 92, temperature 98.7 F (37.1 C), temperature source Temporal, resp. rate 17, height 5' 1"  (1.549 m), weight 180 lb 14.4 oz (82.1 kg), SpO2 100 %.    HEENT: No thrush or ulcers Resp: Lungs clear bilaterally Cardio: Regular rate and rhythm GI: No hepatomegaly, nontender Vascular: No leg edema Neuro: Mild to moderate loss of vibratory sense of   Portacath/PICC-without erythema  Lab Results:  Lab Results  Component Value Date   WBC 4.6 04/06/2019   HGB 12.2 04/06/2019   HCT 36.0 04/06/2019   MCV 85.3 04/06/2019   PLT 133 (L) 04/06/2019   NEUTROABS 2.2 04/06/2019    CMP  Lab Results  Component Value Date   NA 137 04/06/2019   K 3.4 (L) 04/06/2019   CL 100 04/06/2019   CO2 24 04/06/2019   GLUCOSE 120 (H) 04/06/2019   BUN 19 04/06/2019   CREATININE 1.38 (H) 04/06/2019   CALCIUM 10.4 (H) 04/06/2019   PROT 7.6 04/06/2019   ALBUMIN 3.7 04/06/2019   AST 47 (H) 04/06/2019   ALT 59 (H) 04/06/2019   ALKPHOS 93 04/06/2019   BILITOT 0.7 04/06/2019   GFRNONAA 38 (L) 04/06/2019   GFRAA 44 (L) 04/06/2019    Lab Results  Component Value Date   CEA1 1.83 02/23/2019     Medications: I have reviewed the patient's current medications.   Assessment/Plan: sessment/Plan: 1. Colon cancer(T3, N1b)  Colonoscopy 11/24/2018-large 5 cm malignant appearing fungating mass encompassing the ileocecal valve was identified. Pathology showed  invasive adenocarcinoma, moderately differentiated.   CT abdomen/pelvis 12/07/2018-eccentric wall thickening in the cecum, around the ileocecal valve, compatible with findings reported at the recent colonoscopy. Borderline enlarged adjacent lymph nodes in the ileocolic mesentery. No evidence for liver metastases.   CEA 01/13/2019-12.7  CEA 02/23/19 postop/at start of adjuvant chemo - 1.83  01/17/2019 right hemicolectomy by Dr. Lucia Gaskins. 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  Declined genetic testing on 02/20/19, patient indicated she may reconsider after completing adjuvant treatment. Seen by Roma Kayser  [facility] placement per Dr. Lucia Gaskins 02/22/2019   Cycle 1 adjuvant FOLFOX 02/23/19   Cycle 2 adjuvant FOLFOX 03/09/2019  Cycle 3 adjuvant FOLFOX 03/23/2019 (oxaliplatin dose reduced due to low normal neutrophil count)  Cycle 4 adjuvant FOLFOX 10  2. History of iron deficiency anemia 3. Hypertension 4. History of MI status post stent placement 5. History of stroke 1996   Disposition: [name] has completed 3 cycles of adjuvant FOLFOX.  She has tolerated the chemotherapy well aside from acute neuropathy symptoms.  She has mild loss of vibratory sense on exam today.  The plan is to proceed with cycle 4 FOLFOX today.  [name] will return for an office visit and chemotherapy in 2 weeks.  Betsy Coder,  MD  04/06/2019  11:44 AM

## 2019-04-06 NOTE — Patient Instructions (Signed)
[facility] Discharge Instructions for Patients Receiving Chemotherapy  Today you received the following chemotherapy agents: oxaliplatin/leucovirn/florouracil.  To help prevent nausea and vomiting after your treatment, we encourage you to take your nausea medication as directed.     If you develop nausea and vomiting that is not controlled by your nausea medication, call the clinic.   BELOW ARE SYMPTOMS THAT SHOULD BE REPORTED IMMEDIATELY:  *FEVER GREATER THAN 100.5 F  *CHILLS WITH OR WITHOUT FEVER  NAUSEA AND VOMITING THAT IS NOT CONTROLLED WITH YOUR NAUSEA MEDICATION  *UNUSUAL SHORTNESS OF BREATH  *UNUSUAL BRUISING OR BLEEDING  TENDERNESS IN MOUTH AND THROAT WITH OR WITHOUT PRESENCE OF ULCERS  *URINARY PROBLEMS  *BOWEL PROBLEMS  UNUSUAL RASH Items with * indicate a potential emergency and should be followed up as soon as possible.  Feel free to call the clinic you have any questions or concerns. The clinic phone number is [phone].

## 2019-04-07 ENCOUNTER — Telehealth: Payer: Self-pay | Admitting: Oncology

## 2019-04-07 NOTE — Telephone Encounter (Signed)
Scheduled per los. Called and left msg. Mailed printout  °

## 2019-04-08 ENCOUNTER — Other Ambulatory Visit: Payer: Self-pay

## 2019-04-08 ENCOUNTER — Inpatient Hospital Stay: Payer: Medicare Other

## 2019-04-08 VITALS — BP 126/78 | HR 93 | Temp 98.3°F | Resp 16

## 2019-04-08 DIAGNOSIS — C182 Malignant neoplasm of ascending colon: Secondary | ICD-10-CM

## 2019-04-08 DIAGNOSIS — Z5111 Encounter for antineoplastic chemotherapy: Secondary | ICD-10-CM | POA: Diagnosis not present

## 2019-04-08 MED ORDER — SODIUM CHLORIDE 0.9% FLUSH
10.0000 mL | INTRAVENOUS | Status: DC | PRN
  Administered 2019-04-08: 14:00:00 10 mL
  Filled 2019-04-08: qty 10

## 2019-04-08 MED ORDER — HEPARIN SOD (PORK) LOCK FLUSH 100 UNIT/ML IV SOLN
500.0000 [IU] | Freq: Once | INTRAVENOUS | Status: AC | PRN
  Administered 2019-04-08: 500 [IU]
  Filled 2019-04-08: qty 5

## 2019-04-08 MED ORDER — PEGFILGRASTIM-CBQV 6 MG/0.6ML ~~LOC~~ SOSY
6.0000 mg | PREFILLED_SYRINGE | Freq: Once | SUBCUTANEOUS | Status: AC
  Administered 2019-04-08: 6 mg via SUBCUTANEOUS

## 2019-04-08 NOTE — Patient Instructions (Signed)
Take over-the-counter Claritin once daily for the next 3 days. Do NOT use Claritin-D.  Pegfilgrastim injection What is this medicine? PEGFILGRASTIM (PEG fil gra stim) is a long-acting granulocyte colony-stimulating factor that stimulates the growth of neutrophils, a type of white blood cell important in the body's fight against infection. It is used to reduce the incidence of fever and infection in patients with certain types of cancer who are receiving chemotherapy that affects the bone marrow, and to increase survival after being exposed to high doses of radiation. This medicine may be used for other purposes; ask your health care provider or pharmacist if you have questions. COMMON BRAND NAME(S): Steve Rattler, Ziextenzo What should I tell my health care provider before I take this medicine? They need to know if you have any of these conditions:  kidney disease  latex allergy  ongoing radiation therapy  sickle cell disease  skin reactions to acrylic adhesives (On-Body Injector only)  an unusual or allergic reaction to pegfilgrastim, filgrastim, other medicines, foods, dyes, or preservatives  pregnant or trying to get pregnant  breast-feeding How should I use this medicine? This medicine is for injection under the skin. If you get this medicine at home, you will be taught how to prepare and give the pre-filled syringe or how to use the On-body Injector. Refer to the patient Instructions for Use for detailed instructions. Use exactly as directed. Tell your healthcare provider immediately if you suspect that the On-body Injector may not have performed as intended or if you suspect the use of the On-body Injector resulted in a missed or partial dose. It is important that you put your used needles and syringes in a special sharps container. Do not put them in a trash can. If you do not have a sharps container, call your pharmacist or healthcare provider to get one. Talk to  your pediatrician regarding the use of this medicine in children. While this drug may be prescribed for selected conditions, precautions do apply. Overdosage: If you think you have taken too much of this medicine contact a poison control center or emergency room at once. NOTE: This medicine is only for you. Do not share this medicine with others. What if I miss a dose? It is important not to miss your dose. Call your doctor or health care professional if you miss your dose. If you miss a dose due to an On-body Injector failure or leakage, a new dose should be administered as soon as possible using a single prefilled syringe for manual use. What may interact with this medicine? Interactions have not been studied. Give your health care provider a list of all the medicines, herbs, non-prescription drugs, or dietary supplements you use. Also tell them if you smoke, drink alcohol, or use illegal drugs. Some items may interact with your medicine. This list may not describe all possible interactions. Give your health care provider a list of all the medicines, herbs, non-prescription drugs, or dietary supplements you use. Also tell them if you smoke, drink alcohol, or use illegal drugs. Some items may interact with your medicine. What should I watch for while using this medicine? You may need blood work done while you are taking this medicine. If you are going to need a MRI, CT scan, or other procedure, tell your doctor that you are using this medicine (On-Body Injector only). What side effects may I notice from receiving this medicine? Side effects that you should report to your doctor or health care professional as  soon as possible:  allergic reactions like skin rash, itching or hives, swelling of the face, lips, or tongue  back pain  dizziness  fever  pain, redness, or irritation at site where injected  pinpoint red spots on the skin  red or dark-brown urine  shortness of breath or breathing  problems  stomach or side pain, or pain at the shoulder  swelling  tiredness  trouble passing urine or change in the amount of urine Side effects that usually do not require medical attention (report to your doctor or health care professional if they continue or are bothersome):  bone pain  muscle pain This list may not describe all possible side effects. Call your doctor for medical advice about side effects. You may report side effects to FDA at 1-800-FDA-1088. Where should I keep my medicine? Keep out of the reach of children. If you are using this medicine at home, you will be instructed on how to store it. Throw away any unused medicine after the expiration date on the label. NOTE: This sheet is a summary. It may not cover all possible information. If you have questions about this medicine, talk to your doctor, pharmacist, or health care provider.  2020 Elsevier/Gold Standard (2017-08-30 16:57:08)

## 2019-04-14 ENCOUNTER — Other Ambulatory Visit: Payer: Self-pay | Admitting: Oncology

## 2019-04-20 ENCOUNTER — Other Ambulatory Visit: Payer: Self-pay

## 2019-04-20 ENCOUNTER — Inpatient Hospital Stay: Payer: Medicare Other | Attending: Nurse Practitioner

## 2019-04-20 ENCOUNTER — Inpatient Hospital Stay: Payer: Medicare Other

## 2019-04-20 ENCOUNTER — Inpatient Hospital Stay: Payer: Medicare Other | Admitting: Nurse Practitioner

## 2019-04-20 ENCOUNTER — Encounter: Payer: Self-pay | Admitting: Nurse Practitioner

## 2019-04-20 VITALS — BP 116/59 | HR 74 | Temp 97.6°F | Resp 16

## 2019-04-20 VITALS — BP 81/43 | HR 105 | Temp 98.0°F | Resp 16 | Ht 61.0 in | Wt 175.8 lb

## 2019-04-20 DIAGNOSIS — C182 Malignant neoplasm of ascending colon: Secondary | ICD-10-CM

## 2019-04-20 DIAGNOSIS — Z95828 Presence of other vascular implants and grafts: Secondary | ICD-10-CM

## 2019-04-20 DIAGNOSIS — Z5111 Encounter for antineoplastic chemotherapy: Secondary | ICD-10-CM | POA: Insufficient documentation

## 2019-04-20 DIAGNOSIS — I10 Essential (primary) hypertension: Secondary | ICD-10-CM | POA: Insufficient documentation

## 2019-04-20 DIAGNOSIS — C180 Malignant neoplasm of cecum: Secondary | ICD-10-CM | POA: Diagnosis present

## 2019-04-20 DIAGNOSIS — D509 Iron deficiency anemia, unspecified: Secondary | ICD-10-CM | POA: Insufficient documentation

## 2019-04-20 DIAGNOSIS — Z8673 Personal history of transient ischemic attack (TIA), and cerebral infarction without residual deficits: Secondary | ICD-10-CM | POA: Insufficient documentation

## 2019-04-20 DIAGNOSIS — I252 Old myocardial infarction: Secondary | ICD-10-CM | POA: Insufficient documentation

## 2019-04-20 DIAGNOSIS — Z79899 Other long term (current) drug therapy: Secondary | ICD-10-CM | POA: Insufficient documentation

## 2019-04-20 DIAGNOSIS — R599 Enlarged lymph nodes, unspecified: Secondary | ICD-10-CM | POA: Diagnosis not present

## 2019-04-20 LAB — CMP (CANCER CENTER ONLY)
ALT: 49 U/L — ABNORMAL HIGH (ref 0–44)
AST: 48 U/L — ABNORMAL HIGH (ref 15–41)
Albumin: 3.9 g/dL (ref 3.5–5.0)
Alkaline Phosphatase: 152 U/L — ABNORMAL HIGH (ref 38–126)
Anion gap: 14 (ref 5–15)
BUN: 14 mg/dL (ref 8–23)
CO2: 28 mmol/L (ref 22–32)
Calcium: 10.3 mg/dL (ref 8.9–10.3)
Chloride: 97 mmol/L — ABNORMAL LOW (ref 98–111)
Creatinine: 1.6 mg/dL — ABNORMAL HIGH (ref 0.44–1.00)
GFR, Est AFR Am: 37 mL/min — ABNORMAL LOW (ref >60)
GFR, Estimated: 32 mL/min — ABNORMAL LOW (ref >60)
Glucose, Bld: 133 mg/dL — ABNORMAL HIGH (ref 70–99)
Potassium: 3.2 mmol/L — ABNORMAL LOW (ref 3.5–5.1)
Sodium: 139 mmol/L (ref 135–145)
Total Bilirubin: 0.7 mg/dL (ref 0.3–1.2)
Total Protein: 7.6 g/dL (ref 6.5–8.1)

## 2019-04-20 LAB — CBC WITH DIFFERENTIAL (CANCER CENTER ONLY)
Abs Immature Granulocytes: 0.7 10*3/uL — ABNORMAL HIGH (ref 0.00–0.07)
Band Neutrophils: 4 %
Basophils Absolute: 0.4 10*3/uL — ABNORMAL HIGH (ref 0.0–0.1)
Basophils Relative: 2 %
Eosinophils Absolute: 0 10*3/uL (ref 0.0–0.5)
Eosinophils Relative: 0 %
HCT: 39.7 % (ref 36.0–46.0)
Hemoglobin: 13.2 g/dL (ref 12.0–15.0)
Lymphocytes Relative: 14 %
Lymphs Abs: 3.1 10*3/uL (ref 0.7–4.0)
MCH: 30 pg (ref 26.0–34.0)
MCHC: 33.2 g/dL (ref 30.0–36.0)
MCV: 90.2 fL (ref 80.0–100.0)
Metamyelocytes Relative: 1 %
Monocytes Absolute: 0.9 10*3/uL (ref 0.1–1.0)
Monocytes Relative: 4 %
Myelocytes: 2 %
Neutro Abs: 17.2 10*3/uL — ABNORMAL HIGH (ref 1.7–7.7)
Neutrophils Relative %: 73 %
Platelet Count: 100 10*3/uL — ABNORMAL LOW (ref 150–400)
RBC: 4.4 MIL/uL (ref 3.87–5.11)
RDW: 20.4 % — ABNORMAL HIGH (ref 11.5–15.5)
WBC Count: 22.4 10*3/uL — ABNORMAL HIGH (ref 4.0–10.5)
nRBC: 0.5 % — ABNORMAL HIGH (ref 0.0–0.2)

## 2019-04-20 MED ORDER — HEPARIN SOD (PORK) LOCK FLUSH 100 UNIT/ML IV SOLN
500.0000 [IU] | Freq: Once | INTRAVENOUS | Status: AC
  Administered 2019-04-20: 500 [IU]
  Filled 2019-04-20: qty 5

## 2019-04-20 MED ORDER — SODIUM CHLORIDE 0.9 % IV SOLN
Freq: Once | INTRAVENOUS | Status: AC
  Administered 2019-04-20: 13:00:00 via INTRAVENOUS
  Filled 2019-04-20: qty 250

## 2019-04-20 MED ORDER — ONDANSETRON HCL 4 MG/2ML IJ SOLN
8.0000 mg | Freq: Once | INTRAMUSCULAR | Status: AC
  Administered 2019-04-20: 8 mg via INTRAVENOUS

## 2019-04-20 MED ORDER — SODIUM CHLORIDE 0.9 % IV SOLN
Freq: Once | INTRAVENOUS | Status: AC
  Administered 2019-04-20: 13:00:00 via INTRAVENOUS
  Filled 2019-04-20: qty 1000

## 2019-04-20 MED ORDER — ONDANSETRON HCL 8 MG PO TABS: 8.0000 mg | ORAL_TABLET | Freq: Three times a day (TID) | ORAL | 1 refills | Status: DC | PRN

## 2019-04-20 MED ORDER — SODIUM CHLORIDE 0.9 % IV SOLN: 8.0000 mg | Freq: Once | INTRAVENOUS | Status: DC

## 2019-04-20 MED ORDER — ALTEPLASE 2 MG IJ SOLR
INTRAMUSCULAR | Status: AC
  Filled 2019-04-20: qty 2

## 2019-04-20 MED ORDER — SODIUM CHLORIDE 0.9% FLUSH
10.0000 mL | Freq: Once | INTRAVENOUS | Status: AC
  Administered 2019-04-20: 10 mL
  Filled 2019-04-20: qty 10

## 2019-04-20 MED ORDER — ONDANSETRON HCL 4 MG/2ML IJ SOLN
INTRAMUSCULAR | Status: AC
  Filled 2019-04-20: qty 4

## 2019-04-20 MED ORDER — ALTEPLASE 2 MG IJ SOLR
2.0000 mg | Freq: Once | INTRAMUSCULAR | Status: AC
  Administered 2019-04-20: 2 mg
  Filled 2019-04-20: qty 2

## 2019-04-20 NOTE — Patient Instructions (Signed)

## 2019-04-20 NOTE — Progress Notes (Addendum)
[facility] OFFICE PROGRESS NOTE   Diagnosis: Colon cancer  INTERVAL HISTORY:   [name] returns as scheduled.  She completed cycle 4 adjuvant FOLFOX 04/06/2019.  She had nausea beginning day 3.  The nausea has persisted.  No vomiting.  Compazine not effective.  No mouth sores.  She had a few small volume loose stools yesterday.  She has mild persistent cold sensitivity.  No numbness or tingling unrelated to cold exposure.  Objective:  Vital signs in last 24 hours:  Blood pressure (!) 81/43, pulse (!) 105, temperature 98 F (36.7 C), temperature source Temporal, resp. rate 16, height 5' 1"  (1.549 m), weight 175 lb 12.8 oz (79.7 kg), SpO2 100 %.    HEENT: No thrush or ulcers.  Mucous membranes are moist. GI: Abdomen soft and nontender.  No hepatomegaly. Vascular: No leg edema. Neuro: Vibratory sense mildly decreased over the fingertips per tuning fork exam. Skin: Palms without erythema.  Skin turgor mildly decreased. Port-A-Cath without erythema.   Lab Results:  Lab Results  Component Value Date   WBC 22.4 (H) 04/20/2019   HGB 13.2 04/20/2019   HCT 39.7 04/20/2019   MCV 90.2 04/20/2019   PLT 100 (L) 04/20/2019   NEUTROABS 17.2 (H) 04/20/2019    Imaging:  No results found.  Medications: I have reviewed the patient's current medications.  Assessment/Plan: 1. Colon cancer(T3, N1b)  Colonoscopy 11/24/2018-large 5 cm malignant appearing fungating mass encompassing the ileocecal valve was identified. Pathology showed invasive adenocarcinoma, moderately differentiated.   CT abdomen/pelvis 12/07/2018-eccentric wall thickening in the cecum, around the ileocecal valve, compatible with findings reported at the recent colonoscopy. Borderline enlarged adjacent lymph nodes in the ileocolic mesentery. No evidence for liver metastases.   CEA 01/13/2019-12.7  CEA 02/23/19 postop/at start of adjuvant chemo - 1.83  01/17/2019 right hemicolectomy by Dr. Lucia Gaskins.  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  Declined genetic testing on 02/20/19, patient indicated she may reconsider after completing adjuvant treatment. Seen by Roma Kayser  [facility] placement per Dr. Lucia Gaskins 02/22/2019   Cycle 1 adjuvant FOLFOX 02/23/19  Cycle 2 adjuvant FOLFOX 03/09/2019  Cycle 3 adjuvant FOLFOX 03/23/2019 (oxaliplatin dose reduced due to low normal neutrophil count)  Cycle 4 adjuvant FOLFOX 04/06/2019  2. History of iron deficiency anemia 3. Hypertension 4. History of MI status post stent placement 5. History of stroke 1996  Disposition: [name] has completed 4 cycles of FOLFOX.  She had significant delayed nausea following cycle 4.  She appears dehydrated.  We are holding today's treatment.  She will receive a liter of IV fluids with 20 mEq of potassium, repeat vital signs.  We will give her a dose of Zofran.  She was instructed to hold hydrochlorothiazide and Cozaar.  Plan to add Emend with the next cycle of chemotherapy.  We reviewed the labs from today.  The neutrophil count is elevated likely due to previous white cell growth factor support.  She has mild to moderate thrombocytopenia.  She understands to contact the office with bleeding.  Creatinine is elevated likely due to dehydration.  She has hypokalemia, potassium in IV fluids as above.  She will increase potassium to 20 mEq daily.  She will return for lab, follow-up, cycle 5 FOLFOX in 1 week.  She will contact the  office in the interim with any problems.  Patient seen with Dr. Benay Spice.  25 minutes were spent face-to-face at today's visit with the majority of that time involved in counseling/coordination of care.    Ned Card ANP/GNP-BC   04/20/2019  12:34 PM This was a shared visit with Ned Card.  [name] was interviewed and examined.  She had prolonged nausea following the last cycle of FOLFOX.  She appears dehydrated today.  She will receive intravenous fluids and the potassium will be repleted.  She will hold HCTZ and losartan.  Chemotherapy will be held today.  Julieanne Manson, MD

## 2019-04-20 NOTE — Progress Notes (Signed)
Unable to get blood return from port. Blood drawn from arm by [facility] LPN. Cathflo administered by [employer] RN

## 2019-04-20 NOTE — Patient Instructions (Signed)
Hold HCTZ and cozaar; increase potassium to 2 tabs daily

## 2019-04-21 ENCOUNTER — Telehealth: Payer: Self-pay | Admitting: *Deleted

## 2019-04-21 NOTE — Telephone Encounter (Signed)
Called patient to f/u on her status today in regards to nausea, po intake. She reports nausea is gone and she is drinking fluids without difficulty. Feels stronger today. Does not need to come in for fluids. Appreciates the f/u call.

## 2019-04-22 ENCOUNTER — Inpatient Hospital Stay: Payer: Medicare Other

## 2019-04-28 ENCOUNTER — Telehealth: Payer: Self-pay | Admitting: Nurse Practitioner

## 2019-04-28 ENCOUNTER — Encounter: Payer: Self-pay | Admitting: Nurse Practitioner

## 2019-04-28 ENCOUNTER — Telehealth: Payer: Self-pay

## 2019-04-28 ENCOUNTER — Inpatient Hospital Stay: Payer: Medicare Other

## 2019-04-28 ENCOUNTER — Other Ambulatory Visit: Payer: Self-pay

## 2019-04-28 ENCOUNTER — Other Ambulatory Visit: Payer: Self-pay | Admitting: Nurse Practitioner

## 2019-04-28 ENCOUNTER — Inpatient Hospital Stay: Payer: Medicare Other | Admitting: Nurse Practitioner

## 2019-04-28 ENCOUNTER — Telehealth: Payer: Self-pay | Admitting: *Deleted

## 2019-04-28 VITALS — BP 117/60 | HR 95 | Temp 98.7°F | Resp 20 | Ht 61.0 in | Wt 184.7 lb

## 2019-04-28 DIAGNOSIS — Z5111 Encounter for antineoplastic chemotherapy: Secondary | ICD-10-CM | POA: Diagnosis not present

## 2019-04-28 DIAGNOSIS — C182 Malignant neoplasm of ascending colon: Secondary | ICD-10-CM

## 2019-04-28 DIAGNOSIS — Z95828 Presence of other vascular implants and grafts: Secondary | ICD-10-CM

## 2019-04-28 LAB — CMP (CANCER CENTER ONLY)
ALT: 29 U/L (ref 0–44)
AST: 31 U/L (ref 15–41)
Albumin: 3.2 g/dL — ABNORMAL LOW (ref 3.5–5.0)
Alkaline Phosphatase: 110 U/L (ref 38–126)
Anion gap: 10 (ref 5–15)
BUN: 14 mg/dL (ref 8–23)
CO2: 25 mmol/L (ref 22–32)
Calcium: 9.2 mg/dL (ref 8.9–10.3)
Chloride: 103 mmol/L (ref 98–111)
Creatinine: 1.15 mg/dL — ABNORMAL HIGH (ref 0.44–1.00)
GFR, Est AFR Am: 55 mL/min — ABNORMAL LOW (ref >60)
GFR, Estimated: 47 mL/min — ABNORMAL LOW (ref >60)
Glucose, Bld: 147 mg/dL — ABNORMAL HIGH (ref 70–99)
Potassium: 3 mmol/L — CL (ref 3.5–5.1)
Sodium: 138 mmol/L (ref 135–145)
Total Bilirubin: 0.5 mg/dL (ref 0.3–1.2)
Total Protein: 6.5 g/dL (ref 6.5–8.1)

## 2019-04-28 LAB — CBC WITH DIFFERENTIAL (CANCER CENTER ONLY)
Abs Immature Granulocytes: 0.17 10*3/uL — ABNORMAL HIGH (ref 0.00–0.07)
Basophils Absolute: 0.1 10*3/uL (ref 0.0–0.1)
Basophils Relative: 1 %
Eosinophils Absolute: 0.2 10*3/uL (ref 0.0–0.5)
Eosinophils Relative: 2 %
HCT: 33.4 % — ABNORMAL LOW (ref 36.0–46.0)
Hemoglobin: 11.1 g/dL — ABNORMAL LOW (ref 12.0–15.0)
Immature Granulocytes: 2 %
Lymphocytes Relative: 25 %
Lymphs Abs: 2.5 10*3/uL (ref 0.7–4.0)
MCH: 30.4 pg (ref 26.0–34.0)
MCHC: 33.2 g/dL (ref 30.0–36.0)
MCV: 91.5 fL (ref 80.0–100.0)
Monocytes Absolute: 1 10*3/uL (ref 0.1–1.0)
Monocytes Relative: 10 %
Neutro Abs: 6.2 10*3/uL (ref 1.7–7.7)
Neutrophils Relative %: 60 %
Platelet Count: 127 10*3/uL — ABNORMAL LOW (ref 150–400)
RBC: 3.65 MIL/uL — ABNORMAL LOW (ref 3.87–5.11)
RDW: 22 % — ABNORMAL HIGH (ref 11.5–15.5)
WBC Count: 10.1 10*3/uL (ref 4.0–10.5)
nRBC: 0 % (ref 0.0–0.2)

## 2019-04-28 MED ORDER — SODIUM CHLORIDE 0.9% FLUSH
10.0000 mL | Freq: Once | INTRAVENOUS | Status: AC
  Administered 2019-04-28: 10 mL
  Filled 2019-04-28: qty 10

## 2019-04-28 MED ORDER — HEPARIN SOD (PORK) LOCK FLUSH 100 UNIT/ML IV SOLN
500.0000 [IU] | Freq: Once | INTRAVENOUS | Status: AC
  Administered 2019-04-28: 12:00:00 500 [IU]
  Filled 2019-04-28: qty 5

## 2019-04-28 NOTE — Telephone Encounter (Signed)
-----   Message from Owens Shark, NP sent at 04/28/2019  2:31 PM EST ----- Please call her.  Is she taking potassium?  If so, what dose?  If not she needs to begin K-Dur 20 milliequivalents daily.  Please let me know.

## 2019-04-28 NOTE — Telephone Encounter (Signed)
I spoke to [name] regarding the low potassium.  She has been out of potassium for the last 3 days.  She plans to pick up a new prescription from the pharmacy tomorrow.  She will take 20 mEq a day rather than 10 mEq.  We will repeat a basic metabolic panel when she returns for chemotherapy on 05/01/2019.

## 2019-04-28 NOTE — Telephone Encounter (Signed)
[name] from lab called with a potassium critical value of 3.0.  Hand delivered critical value to Dr. Benay Spice and also told Tammi, RN.  [name]

## 2019-04-28 NOTE — Telephone Encounter (Signed)
Per Ned Card, NP, notified pt of potassium level 3.0 and advised pt to take K-Dur 20 MEQ daily. Pt verbalized understanding.

## 2019-04-28 NOTE — Progress Notes (Signed)
  [place] OFFICE PROGRESS NOTE   Diagnosis: Colon cancer  INTERVAL HISTORY:   Ms. [name] returns as scheduled.  She completed cycle 4 FOLFOX 04/06/2019.  Cycle 5 was held 04/20/2019 due to dehydration.  She received IV fluids.  She is feeling much better.  She has mild occasional nausea.  No significant diarrhea.  No mouth sores.  Appetite is considerably better.  She has intermittent pain in the toes and fingertips.  Objective:  Vital signs in last 24 hours:  Blood pressure 117/60, pulse 95, temperature 98.7 F (37.1 C), temperature source Temporal, resp. rate 20, height '5\' 1"'$  (1.549 m), weight 184 lb 11.2 oz (83.8 kg), SpO2 100 %.    HEENT: No thrush or ulcers. GI: Abdomen soft and nontender.  No hepatomegaly. Vascular: No leg edema. Neuro: Alert and oriented. Skin: Normal skin turgor. Port-A-Cath without erythema.   Lab Results:  Lab Results  Component Value Date   WBC 10.1 04/28/2019   HGB 11.1 (L) 04/28/2019   HCT 33.4 (L) 04/28/2019   MCV 91.5 04/28/2019   PLT 127 (L) 04/28/2019   NEUTROABS 6.2 04/28/2019    Imaging:  No results found.  Medications: I have reviewed the patient's current medications.  Assessment/Plan: 1. Colon cancer(T3, N1b)  Colonoscopy 11/24/2018-large 5 cm malignant appearing fungating mass encompassing the ileocecal valve was identified. Pathology showed invasive adenocarcinoma, moderately differentiated.   CT abdomen/pelvis 12/07/2018-eccentric wall thickening in the cecum, around the ileocecal valve, compatible with findings reported at the recent colonoscopy. Borderline enlarged adjacent lymph nodes in the ileocolic mesentery. No evidence for liver metastases.   CEA 01/13/2019-12.7  CEA 02/23/19 postop/at start of adjuvant chemo - 1.83  01/17/2019 right hemicolectomy by Dr. Lucia Gaskins. Final pathology showed invasive colonic adenocarcinoma, 3.3 cm; tumor invades through the muscularis propria into pericolonic tissue;  margins not involved; metastatic carcinoma present in 2 of 18 lymph nodes; lymphovascular invasion present; perineural invasion not identified; mismatch repair protein (IHC) normal; MSI stable.  Declined genetic testing on 02/20/19, patient indicated she may reconsider after completing adjuvant treatment. Seen by Roma Kayser  [facility] placement per Dr. Lucia Gaskins 02/22/2019   Cycle 1 adjuvant FOLFOX 02/23/19  Cycle 2 adjuvant FOLFOX 03/09/2019  Cycle 3 adjuvant FOLFOX 03/23/2019 (oxaliplatin dose reduced due to low normal neutrophil count)  Cycle 4 adjuvant FOLFOX 04/06/2019  2. History of iron deficiency anemia 3. Hypertension 4. History of MI status post stent placement 5. History of stroke 1996   Disposition: [name] appears improved.  She has completed 4 cycles of adjuvant FOLFOX.  Cycle 5 was held on 04/20/2019 due to delayed nausea/dehydration.  She is no longer having nausea, tolerating regular diet.  The plan is to proceed with cycle 5 05/01/2019 with Emend added to the premedication regimen.  She will return for lab, follow-up, cycle 6 of FOLFOX on 05/15/2019.  She will contact the office in the interim with any problems.    Ned Card ANP/GNP-BC   04/28/2019  12:58 PM

## 2019-04-28 NOTE — Telephone Encounter (Signed)
Added appt per 11/20 sch message - unable to reach pt - left message with appt date and time

## 2019-04-28 NOTE — Patient Instructions (Signed)

## 2019-05-01 ENCOUNTER — Telehealth: Payer: Self-pay | Admitting: *Deleted

## 2019-05-01 ENCOUNTER — Other Ambulatory Visit: Payer: Self-pay | Admitting: *Deleted

## 2019-05-01 ENCOUNTER — Inpatient Hospital Stay: Payer: Medicare Other | Admitting: Nutrition

## 2019-05-01 ENCOUNTER — Other Ambulatory Visit: Payer: Medicare Other

## 2019-05-01 ENCOUNTER — Inpatient Hospital Stay: Payer: Medicare Other

## 2019-05-01 ENCOUNTER — Other Ambulatory Visit: Payer: Self-pay

## 2019-05-01 VITALS — BP 116/78 | HR 91 | Temp 98.3°F | Resp 16 | Ht 61.0 in | Wt 185.2 lb

## 2019-05-01 DIAGNOSIS — C182 Malignant neoplasm of ascending colon: Secondary | ICD-10-CM

## 2019-05-01 DIAGNOSIS — Z5111 Encounter for antineoplastic chemotherapy: Secondary | ICD-10-CM | POA: Diagnosis not present

## 2019-05-01 LAB — BASIC METABOLIC PANEL - CANCER CENTER ONLY
Anion gap: 11 (ref 5–15)
BUN: 12 mg/dL (ref 8–23)
CO2: 24 mmol/L (ref 22–32)
Calcium: 9.2 mg/dL (ref 8.9–10.3)
Chloride: 103 mmol/L (ref 98–111)
Creatinine: 1.08 mg/dL — ABNORMAL HIGH (ref 0.44–1.00)
GFR, Est AFR Am: 59 mL/min — ABNORMAL LOW (ref >60)
GFR, Estimated: 51 mL/min — ABNORMAL LOW (ref >60)
Glucose, Bld: 117 mg/dL — ABNORMAL HIGH (ref 70–99)
Potassium: 3.5 mmol/L (ref 3.5–5.1)
Sodium: 138 mmol/L (ref 135–145)

## 2019-05-01 MED ORDER — LEUCOVORIN CALCIUM INJECTION 350 MG
400.0000 mg/m2 | Freq: Once | INTRAVENOUS | Status: AC
  Administered 2019-05-01: 14:00:00 768 mg via INTRAVENOUS
  Filled 2019-05-01: qty 38.4

## 2019-05-01 MED ORDER — DEXTROSE 5 % IV SOLN
Freq: Once | INTRAVENOUS | Status: AC
  Administered 2019-05-01: 13:00:00 via INTRAVENOUS
  Filled 2019-05-01: qty 250

## 2019-05-01 MED ORDER — PALONOSETRON HCL INJECTION 0.25 MG/5ML
0.2500 mg | Freq: Once | INTRAVENOUS | Status: AC
  Administered 2019-05-01: 0.25 mg via INTRAVENOUS

## 2019-05-01 MED ORDER — OXALIPLATIN CHEMO INJECTION 100 MG/20ML
65.0000 mg/m2 | Freq: Once | INTRAVENOUS | Status: AC
  Administered 2019-05-01: 125 mg via INTRAVENOUS
  Filled 2019-05-01: qty 20

## 2019-05-01 MED ORDER — SODIUM CHLORIDE 0.9 % IV SOLN
2400.0000 mg/m2 | INTRAVENOUS | Status: DC
  Administered 2019-05-01: 17:00:00 4600 mg via INTRAVENOUS
  Filled 2019-05-01: qty 92

## 2019-05-01 MED ORDER — FLUOROURACIL CHEMO INJECTION 2.5 GM/50ML
400.0000 mg/m2 | Freq: Once | INTRAVENOUS | Status: AC
  Administered 2019-05-01: 750 mg via INTRAVENOUS
  Filled 2019-05-01: qty 15

## 2019-05-01 MED ORDER — POTASSIUM CHLORIDE ER 10 MEQ PO TBCR: 20.0000 meq | EXTENDED_RELEASE_TABLET | Freq: Every day | ORAL | 1 refills | Status: DC

## 2019-05-01 MED ORDER — SODIUM CHLORIDE 0.9 % IV SOLN
Freq: Once | INTRAVENOUS | Status: AC
  Administered 2019-05-01: 13:00:00 via INTRAVENOUS
  Filled 2019-05-01: qty 5

## 2019-05-01 MED ORDER — PALONOSETRON HCL INJECTION 0.25 MG/5ML
INTRAVENOUS | Status: AC
  Filled 2019-05-01: qty 5

## 2019-05-01 NOTE — Progress Notes (Signed)
Patient was identified to be at risk for malnutrition on the MST secondary to poor appetite and weight loss.  73 year old female diagnosed with colon cancer.  She had a resection in August 2020. She is a patient of Dr. Benay Spice and is receiving FOLFOX.  Past medical history includes obesity, MI, hypertension, hyperlipidemia, GERD, CVA, CAD, and constipation.  Medications include vitamin D, Imodium, Prilosec, Zofran, and Compazine.  Labs include glucose 117.  Patient reports she has had ongoing severe nausea causing decreased oral intake. She denies vomiting. Reports she has started to gain some weight back. Reports she had diarrhea after she consumed milk. She denies other nutrition impact symptoms.  Nutrition diagnosis: Unintended weight loss related to colon cancer and associated treatments as evidenced by no prior need for nutrition related information.  Intervention: Patient was educated to consume small frequent meals and snacks with adequate calories and protein for weight maintenance. Reviewed importance of taking nausea medication. Educated patient on diet to consume if she is experiencing nausea and vomiting.  I provided a fact sheet. Suggested patient try Lactaid milk and monitor response. Questions were answered.  Teach back method used.  Contact information given.  Monitoring, evaluation, goals: Patient will tolerate adequate calories and protein to minimize further weight loss.  Next visit: To be scheduled as needed.  **Disclaimer: This note was dictated with voice recognition software. Similar sounding words can inadvertently be transcribed and this note may contain transcription errors which may not have been corrected upon publication of note.**

## 2019-05-01 NOTE — Telephone Encounter (Signed)
Per Ned Card, NP, notified pt in infusion, potassium is in low normal range and continue 20 mEq daily. Pt verbalized understanding

## 2019-05-01 NOTE — Patient Instructions (Signed)
[facility] Discharge Instructions for Patients Receiving Chemotherapy  Today you received the following chemotherapy agents :  Oxaliplatin,  Leucovorin,  Fluorouracil.  To help prevent nausea and vomiting after your treatment, we encourage you to take your nausea medication as prescribed.   If you develop nausea and vomiting that is not controlled by your nausea medication, call the clinic.   BELOW ARE SYMPTOMS THAT SHOULD BE REPORTED IMMEDIATELY:  *FEVER GREATER THAN 100.5 F  *CHILLS WITH OR WITHOUT FEVER  NAUSEA AND VOMITING THAT IS NOT CONTROLLED WITH YOUR NAUSEA MEDICATION  *UNUSUAL SHORTNESS OF BREATH  *UNUSUAL BRUISING OR BLEEDING  TENDERNESS IN MOUTH AND THROAT WITH OR WITHOUT PRESENCE OF ULCERS  *URINARY PROBLEMS  *BOWEL PROBLEMS  UNUSUAL RASH Items with * indicate a potential emergency and should be followed up as soon as possible.  Feel free to call the clinic should you have any questions or concerns. The clinic phone number is [phone].  Please show the CHEMO ALERT CARD at check-in to the Emergency Department and triage nurse.   

## 2019-05-01 NOTE — Telephone Encounter (Signed)
-----   Message from Owens Shark, NP sent at 05/01/2019  3:18 PM EST ----- I think she is in infusion now.  Please tell her potassium is in low normal range.  She should continue 20 mEq daily.

## 2019-05-03 ENCOUNTER — Telehealth: Payer: Self-pay | Admitting: Oncology

## 2019-05-03 ENCOUNTER — Inpatient Hospital Stay: Payer: Medicare Other

## 2019-05-03 ENCOUNTER — Other Ambulatory Visit: Payer: Self-pay

## 2019-05-03 VITALS — BP 118/72 | HR 88 | Temp 98.2°F | Resp 18

## 2019-05-03 DIAGNOSIS — Z5111 Encounter for antineoplastic chemotherapy: Secondary | ICD-10-CM | POA: Diagnosis not present

## 2019-05-03 DIAGNOSIS — C182 Malignant neoplasm of ascending colon: Secondary | ICD-10-CM

## 2019-05-03 MED ORDER — SODIUM CHLORIDE 0.9% FLUSH
10.0000 mL | INTRAVENOUS | Status: DC | PRN
  Filled 2019-05-03: qty 10

## 2019-05-03 MED ORDER — PEGFILGRASTIM-CBQV 6 MG/0.6ML ~~LOC~~ SOSY
6.0000 mg | PREFILLED_SYRINGE | Freq: Once | SUBCUTANEOUS | Status: AC
  Administered 2019-05-03: 6 mg via SUBCUTANEOUS

## 2019-05-03 MED ORDER — HEPARIN SOD (PORK) LOCK FLUSH 100 UNIT/ML IV SOLN
500.0000 [IU] | Freq: Once | INTRAVENOUS | Status: DC | PRN
  Filled 2019-05-03: qty 5

## 2019-05-03 MED ORDER — PEGFILGRASTIM-CBQV 6 MG/0.6ML ~~LOC~~ SOSY
PREFILLED_SYRINGE | SUBCUTANEOUS | Status: AC
  Filled 2019-05-03: qty 0.6

## 2019-05-03 NOTE — Telephone Encounter (Signed)
Scheduled per los. Called and spoke with husband. Confirmed appt °

## 2019-05-03 NOTE — Patient Instructions (Signed)

## 2019-05-11 ENCOUNTER — Other Ambulatory Visit: Payer: Medicare Other

## 2019-05-11 ENCOUNTER — Ambulatory Visit: Payer: Medicare Other

## 2019-05-11 ENCOUNTER — Ambulatory Visit: Payer: Medicare Other | Admitting: Nurse Practitioner

## 2019-05-14 ENCOUNTER — Other Ambulatory Visit: Payer: Self-pay | Admitting: Oncology

## 2019-05-15 ENCOUNTER — Other Ambulatory Visit: Payer: Self-pay

## 2019-05-15 ENCOUNTER — Inpatient Hospital Stay: Payer: Medicare Other | Admitting: Nurse Practitioner

## 2019-05-15 ENCOUNTER — Inpatient Hospital Stay: Payer: Medicare Other

## 2019-05-15 ENCOUNTER — Inpatient Hospital Stay: Payer: Medicare Other | Attending: Oncology

## 2019-05-15 ENCOUNTER — Encounter: Payer: Self-pay | Admitting: Nurse Practitioner

## 2019-05-15 VITALS — BP 131/88 | HR 92 | Temp 98.3°F | Resp 19 | Ht 61.0 in | Wt 179.5 lb

## 2019-05-15 DIAGNOSIS — G629 Polyneuropathy, unspecified: Secondary | ICD-10-CM | POA: Diagnosis not present

## 2019-05-15 DIAGNOSIS — Z79899 Other long term (current) drug therapy: Secondary | ICD-10-CM | POA: Diagnosis not present

## 2019-05-15 DIAGNOSIS — C182 Malignant neoplasm of ascending colon: Secondary | ICD-10-CM | POA: Diagnosis not present

## 2019-05-15 DIAGNOSIS — Z95828 Presence of other vascular implants and grafts: Secondary | ICD-10-CM

## 2019-05-15 DIAGNOSIS — Z5111 Encounter for antineoplastic chemotherapy: Secondary | ICD-10-CM | POA: Diagnosis not present

## 2019-05-15 DIAGNOSIS — C180 Malignant neoplasm of cecum: Secondary | ICD-10-CM | POA: Insufficient documentation

## 2019-05-15 LAB — CMP (CANCER CENTER ONLY)
ALT: 18 U/L (ref 0–44)
AST: 26 U/L (ref 15–41)
Albumin: 3.4 g/dL — ABNORMAL LOW (ref 3.5–5.0)
Alkaline Phosphatase: 178 U/L — ABNORMAL HIGH (ref 38–126)
Anion gap: 10 (ref 5–15)
BUN: 13 mg/dL (ref 8–23)
CO2: 24 mmol/L (ref 22–32)
Calcium: 9.6 mg/dL (ref 8.9–10.3)
Chloride: 102 mmol/L (ref 98–111)
Creatinine: 1.21 mg/dL — ABNORMAL HIGH (ref 0.44–1.00)
GFR, Est AFR Am: 51 mL/min — ABNORMAL LOW (ref >60)
GFR, Estimated: 44 mL/min — ABNORMAL LOW (ref >60)
Glucose, Bld: 107 mg/dL — ABNORMAL HIGH (ref 70–99)
Potassium: 4.3 mmol/L (ref 3.5–5.1)
Sodium: 136 mmol/L (ref 135–145)
Total Bilirubin: 0.3 mg/dL (ref 0.3–1.2)
Total Protein: 6.9 g/dL (ref 6.5–8.1)

## 2019-05-15 LAB — CBC WITH DIFFERENTIAL (CANCER CENTER ONLY)
Abs Immature Granulocytes: 1.76 10*3/uL — ABNORMAL HIGH (ref 0.00–0.07)
Basophils Absolute: 0.3 10*3/uL — ABNORMAL HIGH (ref 0.0–0.1)
Basophils Relative: 1 %
Eosinophils Absolute: 0.2 10*3/uL (ref 0.0–0.5)
Eosinophils Relative: 1 %
HCT: 36.7 % (ref 36.0–46.0)
Hemoglobin: 12.1 g/dL (ref 12.0–15.0)
Immature Granulocytes: 7 %
Lymphocytes Relative: 14 %
Lymphs Abs: 3.4 10*3/uL (ref 0.7–4.0)
MCH: 31.3 pg (ref 26.0–34.0)
MCHC: 33 g/dL (ref 30.0–36.0)
MCV: 95.1 fL (ref 80.0–100.0)
Monocytes Absolute: 1.3 10*3/uL — ABNORMAL HIGH (ref 0.1–1.0)
Monocytes Relative: 5 %
Neutro Abs: 17.8 10*3/uL — ABNORMAL HIGH (ref 1.7–7.7)
Neutrophils Relative %: 72 %
Platelet Count: 165 10*3/uL (ref 150–400)
RBC: 3.86 MIL/uL — ABNORMAL LOW (ref 3.87–5.11)
RDW: 20.8 % — ABNORMAL HIGH (ref 11.5–15.5)
WBC Count: 24.7 10*3/uL — ABNORMAL HIGH (ref 4.0–10.5)
nRBC: 0.1 % (ref 0.0–0.2)

## 2019-05-15 LAB — CEA (IN HOUSE-CHCC): CEA (CHCC-In House): 3.63 ng/mL (ref 0.00–5.00)

## 2019-05-15 MED ORDER — DEXTROSE 5 % IV SOLN
Freq: Once | INTRAVENOUS | Status: AC
  Administered 2019-05-15: 13:00:00 via INTRAVENOUS
  Filled 2019-05-15: qty 250

## 2019-05-15 MED ORDER — LEUCOVORIN CALCIUM INJECTION 350 MG
400.0000 mg/m2 | Freq: Once | INTRAVENOUS | Status: AC
  Administered 2019-05-15: 768 mg via INTRAVENOUS
  Filled 2019-05-15: qty 38.4

## 2019-05-15 MED ORDER — SODIUM CHLORIDE 0.9% FLUSH
10.0000 mL | Freq: Once | INTRAVENOUS | Status: AC
  Administered 2019-05-15: 10 mL
  Filled 2019-05-15: qty 10

## 2019-05-15 MED ORDER — SODIUM CHLORIDE 0.9 % IV SOLN
2400.0000 mg/m2 | INTRAVENOUS | Status: DC
  Administered 2019-05-15: 4600 mg via INTRAVENOUS
  Filled 2019-05-15: qty 92

## 2019-05-15 MED ORDER — FLUOROURACIL CHEMO INJECTION 2.5 GM/50ML
400.0000 mg/m2 | Freq: Once | INTRAVENOUS | Status: AC
  Administered 2019-05-15: 750 mg via INTRAVENOUS
  Filled 2019-05-15: qty 15

## 2019-05-15 NOTE — Progress Notes (Addendum)
[name] OFFICE PROGRESS NOTE   Diagnosis: Colon cancer  INTERVAL HISTORY:   [name] returns as scheduled.  She completed cycle 5 FOLFOX 05/01/2019.  She again had severe nausea.  This occurred despite receiving Emend.  She tried Zofran with no relief.  No mouth sores.  She has intermittent loose stools.  Fingertips feel "burnt".  She has difficulty turning the pages in a book.  Feet with intermittent tingling.  She reports her balance is "off".  She further states she cannot "walk straight".  Objective:  Vital signs in last 24 hours:  Blood pressure 131/88, pulse 92, temperature 98.3 F (36.8 C), temperature source Temporal, resp. rate 19, height 5' 1"  (1.549 m), weight 179 lb 8 oz (81.4 kg), SpO2 100 %.    HEENT: No thrush or ulcers. GI: Abdomen soft and nontender.  No hepatomegaly. Vascular: No leg edema. Neuro: Vibratory sense with mild decrease over the fingertips per tuning fork exam.  Motor strength intact in all extremities.  Gait is slow, almost shuffling. Skin: Palms without erythema. Port-A-Cath without erythema.   Lab Results:  Lab Results  Component Value Date   WBC 24.7 (H) 05/15/2019   HGB 12.1 05/15/2019   HCT 36.7 05/15/2019   MCV 95.1 05/15/2019   PLT 165 05/15/2019   NEUTROABS 17.8 (H) 05/15/2019    Imaging:  No results found.  Medications: I have reviewed the patient's current medications.  Assessment/Plan: 1. Colon cancer(T3, N1b)  Colonoscopy 11/24/2018-large 5 cm malignant appearing fungating mass encompassing the ileocecal valve was identified. Pathology showed invasive adenocarcinoma, moderately differentiated.   CT abdomen/pelvis 12/07/2018-eccentric wall thickening in the cecum, around the ileocecal valve, compatible with findings reported at the recent colonoscopy. Borderline enlarged adjacent lymph nodes in the ileocolic mesentery. No evidence for liver metastases.   CEA 01/13/2019-12.7  CEA 02/23/19 postop/at  start of adjuvant chemo - 1.83  01/17/2019 right hemicolectomy by Dr. Lucia Gaskins. 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  Declined genetic testing on 02/20/19, patient indicated she may reconsider after completing adjuvant treatment. Seen by Roma Kayser  PAC placement per Dr. Lucia Gaskins 02/22/2019   Cycle 1 adjuvant FOLFOX 02/23/19  Cycle 2 adjuvant FOLFOX 03/09/2019  Cycle 3 adjuvant FOLFOX 03/23/2019 (oxaliplatin dose reduced due to low normal neutrophil count)  Cycle 4 adjuvant FOLFOX 04/06/2019  Cycle 5 adjuvant FOLFOX 05/01/2019  Cycle 6 adjuvant FOLFOX 05/15/2019 (oxaliplatin held due to neuropathy)  2. History of iron deficiency anemia 3. Hypertension 4. History of MI status post stent placement 5. History of stroke 1996  Disposition: [name] appears stable.  She has completed 5 cycles of adjuvant FOLFOX.  She has progressive neuropathy symptoms affecting activity.  Plan to proceed with cycle 6 today.  Oxaliplatin will be held due to the neuropathy symptoms.  With the oxaliplatin being held she will hopefully not have further issue with severe nausea.  She will not require white cell growth factor support on the day of pump discontinuation.  We reviewed the CBC from today.  Counts adequate to proceed with treatment.  She will return for lab and follow-up in approximately 1 month.  She will contact the office in the interim with any problems.  Patient seen with Dr. Benay Spice.  25 minutes were spent face-to-face at today's visit with  the majority of that time involved in counseling/coordination of care.    [name] ANP/GNP-BC   05/15/2019  12:46 PM This was a shared visit with [name].   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  Oxaliplatin will be held from the chemotherapy regimen today.  She will complete a cycle of 5-fluorouracil.  This will complete the planned course of adjuvant therapy.  Cassie Manson, MD

## 2019-05-16 ENCOUNTER — Other Ambulatory Visit: Payer: Self-pay | Admitting: Nurse Practitioner

## 2019-05-16 ENCOUNTER — Other Ambulatory Visit: Payer: Medicare Other

## 2019-05-16 ENCOUNTER — Ambulatory Visit: Payer: Medicare Other | Admitting: Oncology

## 2019-05-16 DIAGNOSIS — C182 Malignant neoplasm of ascending colon: Secondary | ICD-10-CM

## 2019-05-17 ENCOUNTER — Inpatient Hospital Stay: Payer: Medicare Other

## 2019-05-17 ENCOUNTER — Telehealth: Payer: Self-pay | Admitting: Oncology

## 2019-05-17 ENCOUNTER — Other Ambulatory Visit: Payer: Self-pay

## 2019-05-17 ENCOUNTER — Telehealth: Payer: Self-pay | Admitting: *Deleted

## 2019-05-17 VITALS — BP 142/68 | HR 98 | Temp 98.2°F | Resp 18

## 2019-05-17 DIAGNOSIS — C182 Malignant neoplasm of ascending colon: Secondary | ICD-10-CM

## 2019-05-17 DIAGNOSIS — Z5111 Encounter for antineoplastic chemotherapy: Secondary | ICD-10-CM | POA: Diagnosis not present

## 2019-05-17 MED ORDER — HEPARIN SOD (PORK) LOCK FLUSH 100 UNIT/ML IV SOLN
500.0000 [IU] | Freq: Once | INTRAVENOUS | Status: AC | PRN
  Administered 2019-05-17: 500 [IU]
  Filled 2019-05-17: qty 5

## 2019-05-17 MED ORDER — SODIUM CHLORIDE 0.9% FLUSH
10.0000 mL | INTRAVENOUS | Status: DC | PRN
  Administered 2019-05-17: 10 mL
  Filled 2019-05-17: qty 10

## 2019-05-17 NOTE — Telephone Encounter (Signed)
I talk with patient regarding schedule  

## 2019-05-17 NOTE — Telephone Encounter (Signed)
Has been having nausea since treatment on Monday (no oxaliplatin given). States the zofran not helping. Confirmed she had not refilled her Compazine script. Called [facility] and requested they fill her script for her to pick up on the way home. She is aware and will call tomorrow if this does not help.

## 2019-06-16 ENCOUNTER — Inpatient Hospital Stay: Payer: Medicare Other | Attending: Oncology

## 2019-06-16 ENCOUNTER — Inpatient Hospital Stay (HOSPITAL_BASED_OUTPATIENT_CLINIC_OR_DEPARTMENT_OTHER): Payer: Medicare Other | Admitting: Oncology

## 2019-06-16 ENCOUNTER — Other Ambulatory Visit: Payer: Self-pay

## 2019-06-16 ENCOUNTER — Inpatient Hospital Stay: Payer: Medicare Other

## 2019-06-16 VITALS — BP 149/91 | HR 100 | Temp 98.0°F | Resp 18 | Ht 61.0 in | Wt 179.6 lb

## 2019-06-16 DIAGNOSIS — G629 Polyneuropathy, unspecified: Secondary | ICD-10-CM | POA: Diagnosis not present

## 2019-06-16 DIAGNOSIS — C182 Malignant neoplasm of ascending colon: Secondary | ICD-10-CM | POA: Diagnosis not present

## 2019-06-16 DIAGNOSIS — Z95828 Presence of other vascular implants and grafts: Secondary | ICD-10-CM

## 2019-06-16 DIAGNOSIS — C180 Malignant neoplasm of cecum: Secondary | ICD-10-CM | POA: Insufficient documentation

## 2019-06-16 DIAGNOSIS — Z79899 Other long term (current) drug therapy: Secondary | ICD-10-CM | POA: Insufficient documentation

## 2019-06-16 LAB — CMP (CANCER CENTER ONLY)
ALT: 19 U/L (ref 0–44)
AST: 25 U/L (ref 15–41)
Albumin: 3 g/dL — ABNORMAL LOW (ref 3.5–5.0)
Alkaline Phosphatase: 101 U/L (ref 38–126)
Anion gap: 9 (ref 5–15)
BUN: 11 mg/dL (ref 8–23)
CO2: 27 mmol/L (ref 22–32)
Calcium: 9 mg/dL (ref 8.9–10.3)
Chloride: 104 mmol/L (ref 98–111)
Creatinine: 0.94 mg/dL (ref 0.44–1.00)
GFR, Est AFR Am: >60 mL/min (ref >60)
GFR, Estimated: >60 mL/min (ref >60)
Glucose, Bld: 129 mg/dL — ABNORMAL HIGH (ref 70–99)
Potassium: 3.2 mmol/L — ABNORMAL LOW (ref 3.5–5.1)
Sodium: 140 mmol/L (ref 135–145)
Total Bilirubin: 0.5 mg/dL (ref 0.3–1.2)
Total Protein: 6.5 g/dL (ref 6.5–8.1)

## 2019-06-16 LAB — CBC WITH DIFFERENTIAL (CANCER CENTER ONLY)
Abs Immature Granulocytes: 0.06 10*3/uL (ref 0.00–0.07)
Basophils Absolute: 0.1 10*3/uL (ref 0.0–0.1)
Basophils Relative: 1 %
Eosinophils Absolute: 0.1 10*3/uL (ref 0.0–0.5)
Eosinophils Relative: 1 %
HCT: 35.2 % — ABNORMAL LOW (ref 36.0–46.0)
Hemoglobin: 11.8 g/dL — ABNORMAL LOW (ref 12.0–15.0)
Immature Granulocytes: 1 %
Lymphocytes Relative: 22 %
Lymphs Abs: 2.3 10*3/uL (ref 0.7–4.0)
MCH: 32 pg (ref 26.0–34.0)
MCHC: 33.5 g/dL (ref 30.0–36.0)
MCV: 95.4 fL (ref 80.0–100.0)
Monocytes Absolute: 0.8 10*3/uL (ref 0.1–1.0)
Monocytes Relative: 7 %
Neutro Abs: 7.3 10*3/uL (ref 1.7–7.7)
Neutrophils Relative %: 68 %
Platelet Count: 143 10*3/uL — ABNORMAL LOW (ref 150–400)
RBC: 3.69 MIL/uL — ABNORMAL LOW (ref 3.87–5.11)
RDW: 16.4 % — ABNORMAL HIGH (ref 11.5–15.5)
WBC Count: 10.5 10*3/uL (ref 4.0–10.5)
nRBC: 0 % (ref 0.0–0.2)

## 2019-06-16 LAB — CEA (IN HOUSE-CHCC): CEA (CHCC-In House): 3.2 ng/mL (ref 0.00–5.00)

## 2019-06-16 MED ORDER — SODIUM CHLORIDE 0.9% FLUSH
10.0000 mL | Freq: Once | INTRAVENOUS | Status: AC
  Administered 2019-06-16: 10 mL
  Filled 2019-06-16: qty 10

## 2019-06-16 MED ORDER — HEPARIN SOD (PORK) LOCK FLUSH 100 UNIT/ML IV SOLN
500.0000 [IU] | Freq: Once | INTRAVENOUS | Status: AC
  Administered 2019-06-16: 500 [IU]
  Filled 2019-06-16: qty 5

## 2019-06-16 NOTE — Progress Notes (Signed)
[place] OFFICE PROGRESS NOTE   Diagnosis: Colon cancer  INTERVAL HISTORY:   Ms. Minar returns as scheduled.  She continues to have numbness and tingling in the fingers greater than the toes.  She has difficulty holding objects.  She has noted erythema at the base of the left great toe.  She thinks she may have an ingrown toenail.  She continues to have malaise.  Objective:  Vital signs in last 24 hours:  Blood pressure (!) 149/91, pulse 100, temperature 98 F (36.7 C), temperature source Temporal, resp. rate 18, height 5' 1"  (1.549 m), weight 179 lb 9.6 oz (81.5 kg), SpO2 100 %.   Limited physical examination secondary to distancing with the Covid pandemic Lymphatics: No cervical, supraclavicular, axillary, or inguinal nodes GI: No hepatosplenomegaly, no mass, nontender Vascular: No leg edema  Skin: Erythema at the base of the great toenail, dryness of the soles bilaterally  Portacath/PICC-without erythema  Lab Results:  Lab Results  Component Value Date   WBC 10.5 06/16/2019   HGB 11.8 (L) 06/16/2019   HCT 35.2 (L) 06/16/2019   MCV 95.4 06/16/2019   PLT 143 (L) 06/16/2019   NEUTROABS 7.3 06/16/2019    CMP  Lab Results  Component Value Date   NA 140 06/16/2019   K 3.2 (L) 06/16/2019   CL 104 06/16/2019   CO2 27 06/16/2019   GLUCOSE 129 (H) 06/16/2019   BUN 11 06/16/2019   CREATININE 0.94 06/16/2019   CALCIUM 9.0 06/16/2019   PROT 6.5 06/16/2019   ALBUMIN 3.0 (L) 06/16/2019   AST 25 06/16/2019   ALT 19 06/16/2019   ALKPHOS 101 06/16/2019   BILITOT 0.5 06/16/2019   GFRNONAA >60 06/16/2019   GFRAA >60 06/16/2019    Lab Results  Component Value Date   CEA1 3.20 06/16/2019     Medications: I have reviewed the patient's current medications.   Assessment/Plan: 1. Colon cancer(T3, N1b)  Colonoscopy 11/24/2018-large 5 cm malignant appearing fungating mass encompassing the ileocecal valve was identified. Pathology showed invasive  adenocarcinoma, moderately differentiated.   CT abdomen/pelvis 12/07/2018-eccentric wall thickening in the cecum, around the ileocecal valve, compatible with findings reported at the recent colonoscopy. Borderline enlarged adjacent lymph nodes in the ileocolic mesentery. No evidence for liver metastases.   CEA 01/13/2019-12.7  CEA 02/23/19 postop/at start of adjuvant chemo - 1.83  01/17/2019 right hemicolectomy by Dr. Lucia Gaskins. 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  Declined genetic testing on 02/20/19, patient indicated she may reconsider after completing adjuvant treatment. Seen by Roma Kayser  CT chest 02/17/2019-tiny bilateral pulmonary nodules felt to be unlikely related to metastatic disease  PAC placement per Dr. Lucia Gaskins 02/22/2019   Cycle 1 adjuvant FOLFOX 02/23/19  Cycle 2 adjuvant FOLFOX 03/09/2019  Cycle 3 adjuvant FOLFOX 03/23/2019 (oxaliplatin dose reduced due to low normal neutrophil count)  Cycle 4 adjuvant FOLFOX 04/06/2019  Cycle 5 adjuvant FOLFOX 05/01/2019  Cycle 6 adjuvant FOLFOX 05/15/2019 (oxaliplatin held due to neuropathy)  2. History of iron deficiency anemia 3. Hypertension 4. 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 Remmers completed the course of adjuvant chemotherapy.  She is in remission from colon cancer.  She will be scheduled for an office visit and  restaging CTs in 6 months.  We will refer her to Dr. Lucia Gaskins for removal of the Port-A-Cath.  We referred her to podiatry to evaluate the left great toe.   The potassium is mildly low today.  She has not been taking the potassium supplement.  I  recommended she resume potassium and follow-up with Dr. Melford Aase for a repeat chemistry panel. Betsy Coder, MD  06/16/2019  3:01 PM

## 2019-06-19 ENCOUNTER — Telehealth: Payer: Self-pay | Admitting: Oncology

## 2019-06-19 NOTE — Telephone Encounter (Signed)
Scheduled per los. Called and left msg. Mailed printout  °

## 2019-06-24 ENCOUNTER — Other Ambulatory Visit: Payer: Self-pay | Admitting: Cardiology

## 2019-07-04 ENCOUNTER — Encounter: Payer: Self-pay | Admitting: Oncology

## 2019-07-04 ENCOUNTER — Telehealth: Payer: Self-pay | Admitting: *Deleted

## 2019-07-04 NOTE — Telephone Encounter (Signed)
MD reviewed mychart message from husband. They had attempted to see PCP today, but did not get there. Had to get EMS to get her back in the home. Dr. Benay Spice suggests that the PCP should be working this up--her colon cancer is in remission. Suggest if PCP will not be able to see her, that he should have her taken to the hospital for evaluation, possible scans. If he is not able to care for her and she needs rehab, this would be the most efficient way to get this in process. He understands and will ask for ambulance transport.

## 2019-07-05 ENCOUNTER — Encounter (HOSPITAL_COMMUNITY): Payer: Self-pay | Admitting: Emergency Medicine

## 2019-07-05 ENCOUNTER — Inpatient Hospital Stay (HOSPITAL_COMMUNITY)
Admission: EM | Admit: 2019-07-05 | Discharge: 2019-07-07 | DRG: 149 | Disposition: A | Discharge status: Home / Self Care | Payer: Medicare Other | Attending: Family Medicine | Admitting: Family Medicine

## 2019-07-05 ENCOUNTER — Other Ambulatory Visit: Payer: Self-pay

## 2019-07-05 DIAGNOSIS — I10 Essential (primary) hypertension: Secondary | ICD-10-CM | POA: Diagnosis present

## 2019-07-05 DIAGNOSIS — Z85038 Personal history of other malignant neoplasm of large intestine: Secondary | ICD-10-CM | POA: Diagnosis not present

## 2019-07-05 DIAGNOSIS — N182 Chronic kidney disease, stage 2 (mild): Secondary | ICD-10-CM | POA: Diagnosis present

## 2019-07-05 DIAGNOSIS — H81319 Aural vertigo, unspecified ear: Secondary | ICD-10-CM | POA: Diagnosis not present

## 2019-07-05 DIAGNOSIS — I252 Old myocardial infarction: Secondary | ICD-10-CM

## 2019-07-05 DIAGNOSIS — C182 Malignant neoplasm of ascending colon: Secondary | ICD-10-CM | POA: Diagnosis present

## 2019-07-05 DIAGNOSIS — Z79899 Other long term (current) drug therapy: Secondary | ICD-10-CM

## 2019-07-05 DIAGNOSIS — Z8249 Family history of ischemic heart disease and other diseases of the circulatory system: Secondary | ICD-10-CM | POA: Diagnosis not present

## 2019-07-05 DIAGNOSIS — K219 Gastro-esophageal reflux disease without esophagitis: Secondary | ICD-10-CM | POA: Diagnosis present

## 2019-07-05 DIAGNOSIS — Z6833 Body mass index (BMI) 33.0-33.9, adult: Secondary | ICD-10-CM

## 2019-07-05 DIAGNOSIS — W1830XA Fall on same level, unspecified, initial encounter: Secondary | ICD-10-CM | POA: Diagnosis present

## 2019-07-05 DIAGNOSIS — I2510 Atherosclerotic heart disease of native coronary artery without angina pectoris: Secondary | ICD-10-CM

## 2019-07-05 DIAGNOSIS — Z86010 Personal history of colonic polyps: Secondary | ICD-10-CM

## 2019-07-05 DIAGNOSIS — R197 Diarrhea, unspecified: Secondary | ICD-10-CM | POA: Diagnosis not present

## 2019-07-05 DIAGNOSIS — Y92009 Unspecified place in unspecified non-institutional (private) residence as the place of occurrence of the external cause: Secondary | ICD-10-CM

## 2019-07-05 DIAGNOSIS — Z9221 Personal history of antineoplastic chemotherapy: Secondary | ICD-10-CM

## 2019-07-05 DIAGNOSIS — I129 Hypertensive chronic kidney disease with stage 1 through stage 4 chronic kidney disease, or unspecified chronic kidney disease: Secondary | ICD-10-CM | POA: Diagnosis present

## 2019-07-05 DIAGNOSIS — Z20822 Contact with and (suspected) exposure to covid-19: Secondary | ICD-10-CM | POA: Diagnosis present

## 2019-07-05 DIAGNOSIS — Z888 Allergy status to other drugs, medicaments and biological substances status: Secondary | ICD-10-CM

## 2019-07-05 DIAGNOSIS — Z8673 Personal history of transient ischemic attack (TIA), and cerebral infarction without residual deficits: Secondary | ICD-10-CM

## 2019-07-05 DIAGNOSIS — E785 Hyperlipidemia, unspecified: Secondary | ICD-10-CM | POA: Diagnosis present

## 2019-07-05 DIAGNOSIS — Z800 Family history of malignant neoplasm of digestive organs: Secondary | ICD-10-CM

## 2019-07-05 DIAGNOSIS — R531 Weakness: Secondary | ICD-10-CM | POA: Diagnosis present

## 2019-07-05 DIAGNOSIS — E860 Dehydration: Secondary | ICD-10-CM | POA: Diagnosis present

## 2019-07-05 DIAGNOSIS — W19XXXA Unspecified fall, initial encounter: Secondary | ICD-10-CM

## 2019-07-05 DIAGNOSIS — R296 Repeated falls: Secondary | ICD-10-CM | POA: Diagnosis present

## 2019-07-05 DIAGNOSIS — Z9861 Coronary angioplasty status: Secondary | ICD-10-CM

## 2019-07-05 LAB — URINALYSIS, ROUTINE W REFLEX MICROSCOPIC
Bilirubin Urine: NEGATIVE
Glucose, UA: NEGATIVE mg/dL
Hgb urine dipstick: NEGATIVE
Ketones, ur: 5 mg/dL — AB
Nitrite: NEGATIVE
Protein, ur: NEGATIVE mg/dL
Specific Gravity, Urine: 1.009 (ref 1.005–1.030)
pH: 5 (ref 5.0–8.0)

## 2019-07-05 LAB — HEPATIC FUNCTION PANEL
ALT: 18 U/L (ref 0–44)
AST: 24 U/L (ref 15–41)
Albumin: 3.5 g/dL (ref 3.5–5.0)
Alkaline Phosphatase: 81 U/L (ref 38–126)
Bilirubin, Direct: 0.1 mg/dL (ref 0.0–0.2)
Indirect Bilirubin: 0.9 mg/dL (ref 0.3–0.9)
Total Bilirubin: 1 mg/dL (ref 0.3–1.2)
Total Protein: 7.2 g/dL (ref 6.5–8.1)

## 2019-07-05 LAB — BASIC METABOLIC PANEL
Anion gap: 12 (ref 5–15)
BUN: 10 mg/dL (ref 8–23)
CO2: 24 mmol/L (ref 22–32)
Calcium: 10.2 mg/dL (ref 8.9–10.3)
Chloride: 101 mmol/L (ref 98–111)
Creatinine, Ser: 1.1 mg/dL — ABNORMAL HIGH (ref 0.44–1.00)
GFR calc Af Amer: 58 mL/min — ABNORMAL LOW (ref >60)
GFR calc non Af Amer: 50 mL/min — ABNORMAL LOW (ref >60)
Glucose, Bld: 117 mg/dL — ABNORMAL HIGH (ref 70–99)
Potassium: 4 mmol/L (ref 3.5–5.1)
Sodium: 137 mmol/L (ref 135–145)

## 2019-07-05 LAB — CBC
HCT: 40.5 % (ref 36.0–46.0)
Hemoglobin: 13.4 g/dL (ref 12.0–15.0)
MCH: 32.4 pg (ref 26.0–34.0)
MCHC: 33.1 g/dL (ref 30.0–36.0)
MCV: 97.8 fL (ref 80.0–100.0)
Platelets: 260 10*3/uL (ref 150–400)
RBC: 4.14 MIL/uL (ref 3.87–5.11)
RDW: 14.5 % (ref 11.5–15.5)
WBC: 7.8 10*3/uL (ref 4.0–10.5)
nRBC: 0 % (ref 0.0–0.2)

## 2019-07-05 LAB — RESPIRATORY PANEL BY RT PCR (FLU A&B, COVID)
Influenza A by PCR: NEGATIVE
Influenza B by PCR: NEGATIVE
SARS Coronavirus 2 by RT PCR: NEGATIVE

## 2019-07-05 MED ORDER — ACETAMINOPHEN 325 MG PO TABS: 650.0000 mg | ORAL_TABLET | Freq: Four times a day (QID) | ORAL | Status: DC | PRN

## 2019-07-05 MED ORDER — ENOXAPARIN SODIUM 40 MG/0.4ML ~~LOC~~ SOLN
40.0000 mg | SUBCUTANEOUS | Status: DC
  Administered 2019-07-05 – 2019-07-06 (×2): 40 mg via SUBCUTANEOUS
  Filled 2019-07-05 (×2): qty 0.4

## 2019-07-05 MED ORDER — ONDANSETRON HCL 4 MG PO TABS: 4.0000 mg | ORAL_TABLET | Freq: Four times a day (QID) | ORAL | Status: DC | PRN

## 2019-07-05 MED ORDER — SODIUM CHLORIDE 0.9 % IV SOLN: INTRAVENOUS | Status: DC

## 2019-07-05 MED ORDER — ONDANSETRON HCL 4 MG/2ML IJ SOLN
4.0000 mg | Freq: Four times a day (QID) | INTRAMUSCULAR | Status: DC | PRN
  Administered 2019-07-06: 4 mg via INTRAVENOUS
  Filled 2019-07-05: qty 2

## 2019-07-05 MED ORDER — ACETAMINOPHEN 650 MG RE SUPP: 650.0000 mg | Freq: Four times a day (QID) | RECTAL | Status: DC | PRN

## 2019-07-05 NOTE — ED Provider Notes (Signed)
[place] DEPT Provider Note   CSN: [id number] Arrival date & time: 07/05/19  1522     History Chief Complaint  Patient presents with  . Weakness    [name] is a 74 y.o. female.  74yo female presents from home by EMS for generalized weakness, progressively worsening since the beginning of the month with multiple falls. Patient lives with her husband, states he and her daughter are unable to pick her up off the floor and she is unable to help at times. Also reports uncontrollable diarrhea for the past 2 days, no recent antibiotics, no sick contacts, no travel, no history of c. Diff. Patient denies significant injuries from her falls, denies LOC or hitting her head, is not anticoagulated. Patient was treated for colon cancer this past summer, completed chemo in December and is in remission, reports numbness in her fingers and toes at times and was told this is a result of her chemo. No other complaints or concerns.         Past Medical History:  Diagnosis Date  . Cancer of right colon ([place])   . Constipation   . Coronary artery disease   . CVA (cerebral infarction) 1996  . Dyspnea    going up stairs and hills  . Family history of colon cancer   . GERD (gastroesophageal reflux disease)   . History of cardiomegaly   . Hyperlipidemia   . Hypertension   . Iron deficiency anemia   . Myocardial infarction ([place]) 2017  . NSTEMI (non-ST elevated myocardial infarction) ([place])   . Personal history of colonic polyps   . PONV (postoperative nausea and vomiting)   . Right shoulder pain    more painful at night , cold makes it worse  . Severe obesity [facility])     Patient Active Problem List   Diagnosis Date Noted  . Port-A-Cath in place 02/23/2019  . Family history of colon cancer   . Personal history of colonic polyps   . History of MI (myocardial infarction) 01/22/2019  . Severe obesity ([place])   . PONV (postoperative nausea and  vomiting)   . Iron deficiency anemia   . Hypertension   . Hyperlipidemia   . History of cardiomegaly   . GERD (gastroesophageal reflux disease)   . Cancer of right colon - resected 01/17/2019 01/17/2019  . CAD S/P percutaneous coronary angioplasty 07/10/2016  . Essential hypertension 02/28/2016  . Dyslipidemia, goal LDL below 70 02/28/2016    Past Surgical History:  Procedure Laterality Date  . ABDOMINAL HYSTERECTOMY    . BACK SURGERY     x2  . CARDIAC CATHETERIZATION N/A 02/28/2016   Procedure: Left Heart Cath and Coronary Angiography;  Surgeon: Peter M Martinique, MD;  Location: [place] [facility] CV LAB;  Service: Cardiovascular;  Laterality: N/A;  . CARDIAC CATHETERIZATION N/A 02/28/2016   Procedure: Coronary Stent Intervention;  Surgeon: Peter M Martinique, MD;  Location: [place] [facility] CV LAB;  Service: Cardiovascular;  Laterality: N/A;  . CHOLECYSTECTOMY    . COLONOSCOPY  11/24/2018  . CORONARY ANGIOPLASTY    . CYST EXCISION Left    Cheek  . LAPAROSCOPIC RIGHT HEMI COLECTOMY Right 01/17/2019   Procedure: LAPAROSCOPIC RIGHT HEMI COLECTOMY;  Surgeon: Alphonsa Overall, MD;  Location: [facility];  Service: General;  Laterality: Right;  . NASAL FRACTURE SURGERY    . PORTACATH PLACEMENT N/A 02/22/2019   Procedure: POWER PORT PLACEMENT;  Surgeon: Alphonsa Overall, MD;  Location: [facility];  Service: General;  Laterality:  N/A;  . UPPER GI ENDOSCOPY  11/24/2018     OB History   No obstetric history on file.     Family History  Problem Relation Age of Onset  . Esophageal cancer Sister 27  . Lung cancer Brother 63  . CAD Daughter   . Breast cancer Maternal Aunt        dx over 77  . Colon polyps Sister        total of 24 polyps  . Colon cancer Sister 54  . Breast cancer Cousin        dx in her 58s; mat first cousin    Social History   Tobacco Use  . Smoking status: Never Smoker  . Smokeless tobacco: Never Used  Substance Use Topics  . Alcohol use: No  . Drug use: No    Home Medications Prior to  Admission medications   Medication Sig Start Date End Date Taking? Authorizing Provider  amLODipine (NORVASC) 5 MG tablet Take 5 mg by mouth daily.   Yes [provider]  cholecalciferol (VITAMIN D) 25 MCG (1000 UT) tablet Take 2,000 Units by mouth daily.   Yes [provider]  ezetimibe (ZETIA) 10 MG tablet Take 1 tablet by mouth once daily 06/26/19  Yes Martinique, Peter M, MD  hydrochlorothiazide (HYDRODIURIL) 25 MG tablet Take 25 mg by mouth daily.   Yes [provider]  lidocaine-prilocaine (EMLA) cream Apply to portacath site 1 hour prior to use 02/07/19  Yes Owens Shark, NP  loperamide (IMODIUM) 2 MG capsule Take 2-4 mg by mouth 4 (four) times daily as needed for diarrhea or loose stools.    Yes [provider]  losartan (COZAAR) 100 MG tablet Take 100 mg by mouth daily.   Yes [provider]  omeprazole (PRILOSEC) 40 MG capsule Take 40 mg by mouth daily before breakfast.    Yes [provider]  ondansetron (ZOFRAN) 8 MG tablet Take 1 tablet (8 mg total) by mouth every 8 (eight) hours as needed for nausea or vomiting. Patient not taking: Reported on 06/16/2019 04/20/19   Owens Shark, NP  potassium chloride (KLOR-CON) 10 MEQ tablet Take 2 tablets (20 mEq total) by mouth daily. Patient not taking: Reported on 06/16/2019 05/01/19   Ladell Pier, MD  prochlorperazine (COMPAZINE) 10 MG tablet Take 1 tablet (10 mg total) by mouth every 6 (six) hours as needed for nausea or vomiting. Patient not taking: Reported on 06/16/2019 02/07/19   Owens Shark, NP  traMADol (ULTRAM) 50 MG tablet Take 1 tablet (50 mg total) by mouth every 6 (six) hours as needed for moderate pain or severe pain. Patient not taking: Reported on 06/16/2019 02/22/19   Alphonsa Overall, MD    Allergies    Guaifenesin er, Lotensin [benazepril hcl], and Propoxyphene  Review of Systems   Review of Systems  Constitutional: Negative for chills and fever.  Eyes: Negative for visual  disturbance.  Respiratory: Negative for shortness of breath.   Cardiovascular: Negative for chest pain.  Gastrointestinal: Positive for diarrhea. Negative for abdominal pain, constipation, nausea and vomiting.  Musculoskeletal: Negative for arthralgias and myalgias.  Skin: Negative for rash and wound.  Allergic/Immunologic: Negative for immunocompromised state.  Neurological: Positive for weakness. Negative for dizziness and speech difficulty.  Hematological: Does not bruise/bleed easily.  Psychiatric/Behavioral: Negative for confusion.  All other systems reviewed and are negative.   Physical Exam Updated Vital Signs BP (!) 154/109 (BP Location: Right Arm)   Pulse  81   Temp 98.2 F (36.8 C) (Oral)   Resp 15   Ht 5\' 2"  (1.575 m)   Wt 81.2 kg   SpO2 100%   BMI 32.74 kg/m   Physical Exam Vitals and nursing note reviewed.  Constitutional:      General: She is not in acute distress.    Appearance: She is well-developed. She is not diaphoretic.  HENT:     Head: Normocephalic and atraumatic.  Cardiovascular:     Rate and Rhythm: Normal rate and regular rhythm.     Pulses: Normal pulses.     Heart sounds: Normal heart sounds.  Pulmonary:     Effort: Pulmonary effort is normal.     Breath sounds: Normal breath sounds.  Abdominal:     Palpations: Abdomen is soft.     Tenderness: There is no abdominal tenderness.  Musculoskeletal:        General: No swelling, tenderness or signs of injury.     Right lower leg: No edema.     Left lower leg: No edema.  Skin:    General: Skin is warm and dry.     Findings: Bruising present. No erythema or rash.     Comments: Old bruise to right knee and left breast  Neurological:     Mental Status: She is alert and oriented to person, place, and time.     Motor: No weakness.  Psychiatric:        Behavior: Behavior normal.     ED Results / Procedures / Treatments   Labs (all labs ordered are listed, but only abnormal results are  displayed) Labs Reviewed  BASIC METABOLIC PANEL - Abnormal; Notable for the following components:      Result Value   Glucose, Bld 117 (*)    Creatinine, Ser 1.10 (*)    GFR calc non Af Amer 50 (*)    GFR calc Af Amer 58 (*)    All other components within normal limits  RESPIRATORY PANEL BY RT PCR (FLU A&B, COVID)  GI PATHOGEN PANEL BY PCR, STOOL  C DIFFICILE QUICK SCREEN W PCR REFLEX  CBC  HEPATIC FUNCTION PANEL  URINALYSIS, ROUTINE W REFLEX MICROSCOPIC    EKG EKG Interpretation  Date/Time:  Wednesday July 05 2019 15:42:14 EST Ventricular Rate:  110 PR Interval:    QRS Duration: 82 QT Interval:  328 QTC Calculation: 444 R Axis:   -37 Text Interpretation: Sinus tachycardia Consider right ventricular hypertrophy Inferior infarct, old T wave changes improved compared to 2017 Confirmed by Sherwood Gambler [phone]) on 07/05/2019 4:08:28 PM   Radiology No results found.  Procedures Procedures (including critical care time)  Medications Ordered in ED Medications - No data to display  ED Course  I have reviewed the triage vital signs and the nursing notes.  Pertinent labs & imaging results that were available during my care of the patient were reviewed by me and considered in my medical decision making (see chart for details).  Clinical Course as of Jul 04 1836  Wed Jul 04, 3048  [id number] 74 year old female presents from home with progressive weakness, multiple falls without significant injury and uncontrollable diarrhea.  Exam is unremarkable, abdomen is soft and nontender, arm and leg strength are symmetric.  Labs without significant finding occluding CBC, CMP.  Urinalysis is pending, respiratory panel negative for flu and Covid.  C. difficile screen pending as well as GI bio fire. Case discussed with Dr. Sherry Ruffing, ER attending, agrees with plan to consult  for admission. Case discussed with Dr. Jonelle Sidle with [facility] hospitalist service who will consult for admission.   [LM]      Clinical Course User Index [LM] Roque Lias   MDM Rules/Calculators/A&P                      Final Clinical Impression(s) / ED Diagnoses Final diagnoses:  Generalized weakness  Diarrhea, unspecified type  Fall, initial encounter    Rx / DC Orders ED Discharge Orders    None       Roque Lias 07/05/19 1838    Tegeler, Gwenyth Allegra, MD 07/08/19 [phone]

## 2019-07-05 NOTE — ED Notes (Signed)
Pt ambulated to the restroom with walker- minimal assistance.

## 2019-07-05 NOTE — ED Triage Notes (Signed)
Per pt, states she had colon cancer surgery this past summer-states her oncologist told her she was in remission-states for the past 3 weeks she has been falling a lot, weak, no eating and having diarrhea-her husband talked to her nurse at cancer center at they told her to ocme to ED for evaluation

## 2019-07-05 NOTE — H&P (Signed)
History and Physical   [name] [id number] DOB: March 15, 1946 DOA: 07/05/2019  Referring MD/NP/PA: Suella Broad, PA  PCP: Chesley Noon, MD   Patient coming from: Home  Chief Complaint: Fall and weakness  HPI: [name] is a 74 y.o. female with medical history significant of right colon cancer, CVA, GERD, coronary artery disease, hypertension, hyperlipidemia and morbid obesity who has had progressive weakness and falls in the past.  Patient was brought in by family today due to worsening weakness progressively.  This has been going on for a while but got worse today.  She uses a walker but was unable to contain herself.  She lives with her husband who is unable to handle her at this point.  When she fell today they were unable to pick her up.  Patient has also had severe diarrhea within the last 2 days.  She has remote history of C. difficile colitis but nothing recently.  And no recent antibiotics.  Patient was seen and evaluated.  Appears chronically ill.  She is being admitted for evaluation of her fall, weakness and possible C. difficile colitis..  ED Course: Temperature 98.2 blood pressure 170/88 pulse 109 respiratory rate of 18 oxygen sat 96% room air.  Chemistry appears to be reasonably normal CBC also within normal COVID-19 is negative.  Urinalysis showed small leukocytes with rare bacteria WBC 11-20.  Troponin is negative.  Patient being admitted for evaluation of fall and diarrhea.  Review of Systems: As per HPI otherwise 10 point review of systems negative.    Past Medical History:  Diagnosis Date  . Cancer of right colon ([place])   . Constipation   . Coronary artery disease   . CVA (cerebral infarction) 1996  . Dyspnea    going up stairs and hills  . Family history of colon cancer   . GERD (gastroesophageal reflux disease)   . History of cardiomegaly   . Hyperlipidemia   . Hypertension   . Iron deficiency anemia   . Myocardial infarction  ([place]) 2017  . NSTEMI (non-ST elevated myocardial infarction) ([place])   . Personal history of colonic polyps   . PONV (postoperative nausea and vomiting)   . Right shoulder pain    more painful at night , cold makes it worse  . Severe obesity ([place])     Past Surgical History:  Procedure Laterality Date  . ABDOMINAL HYSTERECTOMY    . BACK SURGERY     x2  . CARDIAC CATHETERIZATION N/A 02/28/2016   Procedure: Left Heart Cath and Coronary Angiography;  Surgeon: Peter M Martinique, MD;  Location: [facility];  Service: Cardiovascular;  Laterality: N/A;  . CARDIAC CATHETERIZATION N/A 02/28/2016   Procedure: Coronary Stent Intervention;  Surgeon: Peter M Martinique, MD;  Location: [facility];  Service: Cardiovascular;  Laterality: N/A;  . CHOLECYSTECTOMY    . COLONOSCOPY  11/24/2018  . CORONARY ANGIOPLASTY    . CYST EXCISION Left    Cheek  . LAPAROSCOPIC RIGHT HEMI COLECTOMY Right 01/17/2019   Procedure: LAPAROSCOPIC RIGHT HEMI COLECTOMY;  Surgeon: Alphonsa Overall, MD;  Location: [facility];  Service: General;  Laterality: Right;  . NASAL FRACTURE SURGERY    . PORTACATH PLACEMENT N/A 02/22/2019   Procedure: POWER PORT PLACEMENT;  Surgeon: Alphonsa Overall, MD;  Location: [facility];  Service: General;  Laterality: N/A;  . UPPER GI ENDOSCOPY  11/24/2018     reports that she has never smoked. She has never used smokeless tobacco. She  reports that she does not drink alcohol or use drugs.  Allergies  Allergen Reactions  . Guaifenesin Er Palpitations  . Lotensin [Benazepril Hcl] Other (See Comments)    Took off when patient had stroke  . Propoxyphene Palpitations    Family History  Problem Relation Age of Onset  . Esophageal cancer Sister 64  . Lung cancer Brother 56  . CAD Daughter   . Breast cancer Maternal Aunt        dx over 68  . Colon polyps Sister        total of 24 polyps  . Colon cancer Sister 11  . Breast cancer Cousin        dx in her 42s; mat first cousin     Prior to  Admission medications   Medication Sig Start Date End Date Taking? Authorizing Provider  amLODipine (NORVASC) 5 MG tablet Take 5 mg by mouth daily.   Yes [provider]  cholecalciferol (VITAMIN D) 25 MCG (1000 UT) tablet Take 2,000 Units by mouth daily.   Yes [provider]  ezetimibe (ZETIA) 10 MG tablet Take 1 tablet by mouth once daily 06/26/19  Yes Martinique, Peter M, MD  hydrochlorothiazide (HYDRODIURIL) 25 MG tablet Take 25 mg by mouth daily.   Yes [provider]  lidocaine-prilocaine (EMLA) cream Apply to portacath site 1 hour prior to use 02/07/19  Yes Owens Shark, NP  loperamide (IMODIUM) 2 MG capsule Take 2-4 mg by mouth 4 (four) times daily as needed for diarrhea or loose stools.    Yes [provider]  losartan (COZAAR) 100 MG tablet Take 100 mg by mouth daily.   Yes [provider]  omeprazole (PRILOSEC) 40 MG capsule Take 40 mg by mouth daily before breakfast.    Yes [provider]  ondansetron (ZOFRAN) 8 MG tablet Take 1 tablet (8 mg total) by mouth every 8 (eight) hours as needed for nausea or vomiting. Patient not taking: Reported on 06/16/2019 04/20/19   Owens Shark, NP  potassium chloride (KLOR-CON) 10 MEQ tablet Take 2 tablets (20 mEq total) by mouth daily. Patient not taking: Reported on 06/16/2019 05/01/19   Ladell Pier, MD  prochlorperazine (COMPAZINE) 10 MG tablet Take 1 tablet (10 mg total) by mouth every 6 (six) hours as needed for nausea or vomiting. Patient not taking: Reported on 06/16/2019 02/07/19   Owens Shark, NP  traMADol (ULTRAM) 50 MG tablet Take 1 tablet (50 mg total) by mouth every 6 (six) hours as needed for moderate pain or severe pain. Patient not taking: Reported on 06/16/2019 02/22/19   Alphonsa Overall, MD    Physical Exam: Vitals:   07/05/19 1530 07/05/19 1745 07/05/19 1845 07/05/19 1900  BP: (!) 154/109  (!) 170/88 (!) 159/87  Pulse: (!) 109 81 88 84  Resp: 15  18 18   Temp: 98.2 F (36.8 C)      TempSrc: Oral     SpO2: 96% 100% 97% 97%  Weight: 81.2 kg     Height: 5\' 2"  (1.575 m)         Constitutional: Morbidly obese, chronically ill looking and weak Vitals:   07/05/19 1530 07/05/19 1745 07/05/19 1845 07/05/19 1900  BP: (!) 154/109  (!) 170/88 (!) 159/87  Pulse: (!) 109 81 88 84  Resp: 15  18 18   Temp: 98.2 F (36.8 C)     TempSrc: Oral     SpO2: 96% 100% 97% 97%  Weight: 81.2 kg  Height: 5\' 2"  (1.575 m)      Eyes: PERRL, lids and conjunctivae normal ENMT: Mucous membranes are dry. Posterior pharynx clear of any exudate or lesions.Normal dentition.  Neck: normal, supple, no masses, no thyromegaly Respiratory: clear to auscultation bilaterally, no wheezing, no crackles. Normal respiratory effort. No accessory muscle use.  Cardiovascular: Sinus tachycardia no murmurs / rubs / gallops. No extremity edema. 2+ pedal pulses. No carotid bruits.  Abdomen: no tenderness, no masses palpated. No hepatosplenomegaly. Bowel sounds positive.  Musculoskeletal: no clubbing / cyanosis. No joint deformity upper and lower extremities. Good ROM, no contractures. Normal muscle tone.  Skin: no rashes, lesions, ulcers. No induration Neurologic: CN 2-12 grossly intact. Sensation intact, DTR normal. Strength 5/5 in all 4.  Psychiatric: Normal judgment and insight. Alert and oriented x 3. Normal mood.     Labs on Admission: I have personally reviewed following labs and imaging studies  CBC: Recent Labs  Lab 07/05/19 1626  WBC 7.8  HGB 13.4  HCT 40.5  MCV 97.8  PLT 123456   Basic Metabolic Panel: Recent Labs  Lab 07/05/19 1626  NA 137  K 4.0  CL 101  CO2 24  GLUCOSE 117*  BUN 10  CREATININE 1.10*  CALCIUM 10.2   GFR: Estimated Creatinine Clearance: 44.9 mL/min (A) (by C-G formula based on SCr of 1.1 mg/dL (H)). Liver Function Tests: Recent Labs  Lab 07/05/19 1617  AST 24  ALT 18  ALKPHOS 81  BILITOT 1.0  PROT 7.2  ALBUMIN 3.5   No results for input(s): LIPASE,  AMYLASE in the last 168 hours. No results for input(s): AMMONIA in the last 168 hours. Coagulation Profile: No results for input(s): INR, PROTIME in the last 168 hours. Cardiac Enzymes: No results for input(s): CKTOTAL, CKMB, CKMBINDEX, TROPONINI in the last 168 hours. BNP (last 3 results) No results for input(s): PROBNP in the last 8760 hours. HbA1C: No results for input(s): HGBA1C in the last 72 hours. CBG: No results for input(s): GLUCAP in the last 168 hours. Lipid Profile: No results for input(s): CHOL, HDL, LDLCALC, TRIG, CHOLHDL, LDLDIRECT in the last 72 hours. Thyroid Function Tests: No results for input(s): TSH, T4TOTAL, FREET4, T3FREE, THYROIDAB in the last 72 hours. Anemia Panel: No results for input(s): VITAMINB12, FOLATE, FERRITIN, TIBC, IRON, RETICCTPCT in the last 72 hours. Urine analysis: No results found for: COLORURINE, APPEARANCEUR, LABSPEC, PHURINE, GLUCOSEU, HGBUR, BILIRUBINUR, KETONESUR, PROTEINUR, UROBILINOGEN, NITRITE, LEUKOCYTESUR Sepsis Labs: @LABRCNTIP (procalcitonin:4,lacticidven:4) ) Recent Results (from the past 240 hour(s))  Respiratory Panel by RT PCR (Flu A&B, Covid) - Nasopharyngeal Swab     Status: None   Collection Time: 07/05/19  4:32 PM   Specimen: Nasopharyngeal Swab  Result Value Ref Range Status   SARS Coronavirus 2 by RT PCR NEGATIVE NEGATIVE Final    Comment: (NOTE) SARS-CoV-2 target nucleic acids are NOT DETECTED. The SARS-CoV-2 RNA is generally detectable in upper respiratoy specimens during the acute phase of infection. The lowest concentration of SARS-CoV-2 viral copies this assay can detect is 131 copies/mL. A negative result does not preclude SARS-Cov-2 infection and should not be used as the sole basis for treatment or other patient management decisions. A negative result may occur with  improper specimen collection/handling, submission of specimen other than nasopharyngeal swab, presence of viral mutation(s) within the areas  targeted by this assay, and inadequate number of viral copies (<131 copies/mL). A negative result must be combined with clinical observations, patient history, and epidemiological information. The expected result is Negative. Fact Sheet  for Patients:  PinkCheek.be Fact Sheet for Healthcare Providers:  GravelBags.it This test is not yet ap proved or cleared by the [place] FDA and  has been authorized for detection and/or diagnosis of SARS-CoV-2 by FDA under an Emergency Use Authorization (EUA). This EUA will remain  in effect (meaning this test can be used) for the duration of the COVID-19 declaration under Section 564(b)(1) of the Act, 21 U.S.C. section 360bbb-3(b)(1), unless the authorization is terminated or revoked sooner.    Influenza A by PCR NEGATIVE NEGATIVE Final   Influenza B by PCR NEGATIVE NEGATIVE Final    Comment: (NOTE) The Xpert Xpress SARS-CoV-2/FLU/RSV assay is intended as an aid in  the diagnosis of influenza from Nasopharyngeal swab specimens and  should not be used as a sole basis for treatment. Nasal washings and  aspirates are unacceptable for Xpert Xpress SARS-CoV-2/FLU/RSV  testing. Fact Sheet for Patients: PinkCheek.be Fact Sheet for Healthcare Providers: GravelBags.it This test is not yet approved or cleared by the [place] FDA and  has been authorized for detection and/or diagnosis of SARS-CoV-2 by  FDA under an Emergency Use Authorization (EUA). This EUA will remain  in effect (meaning this test can be used) for the duration of the  Covid-19 declaration under Section 564(b)(1) of the Act, 21  U.S.C. section 360bbb-3(b)(1), unless the authorization is  terminated or revoked. Performed at [facility], [place] [address], [place], [place] [address]      Radiological Exams on Admission: No results found.  EKG:  Independently reviewed.  Sinus tachycardia with a rate of 110, no significant ST changes  Assessment/Plan Principal Problem:   Generalized weakness Active Problems:   Essential hypertension   Dyslipidemia, goal LDL below 70   CAD S/P percutaneous coronary angioplasty   Cancer of right colon - resected 01/17/2019   Hypertension   Hyperlipidemia   GERD (gastroesophageal reflux disease)   Diarrhea   Fall at home, initial encounter     #1 generalized weakness: Multifactorial.  Patient will be admitted.  Physical therapy and Occupational Therapy.  Evaluate other causes especially gait abnormalities and dehydration.  Continue evaluation for disposition.  #2 recurrent falls at home: Probably gait abnormalities.  Less likely neurologic.  Again evaluate for his physical therapy to determine disposition and further needs.  #3 diarrhea: Patient has not had any diarrhea so far in the ER.  We will evaluate for C. difficile colitis.  If confirmed we will initiate treatment with oral vancomycin.  #4 hyperlipidemia: Continue home statin.  #5 hypertension: Continue home treatment  #6 history of colon cancer: Status post resection.  Patient has had chemotherapy  #7 morbid obesity: Dietary counseling   DVT prophylaxis: Lovenox Code Status: Full code Family Communication: Husband and daughter over the phone Disposition Plan: To be determined Consults called: None Admission status: Inpatient  Severity of Illness: The appropriate patient status for this patient is INPATIENT. Inpatient status is judged to be reasonable and necessary in order to provide the required intensity of service to ensure the patient's safety. The patient's presenting symptoms, physical exam findings, and initial radiographic and laboratory data in the context of their chronic comorbidities is felt to place them at high risk for further clinical deterioration. Furthermore, it is not anticipated that the patient will be  medically stable for discharge from the hospital within 2 midnights of admission. The following factors support the patient status of inpatient.   " The patient's presenting symptoms include recurrent fall. " The worrisome  physical exam findings include generalized weakness and dry mucous membrane. " The initial radiographic and laboratory data are worrisome because of abnormal urine. " The chronic co-morbidities include colon cancer.   * I certify that at the point of admission it is my clinical judgment that the patient will require inpatient hospital care spanning beyond 2 midnights from the point of admission due to high intensity of service, high risk for further deterioration and high frequency of surveillance required.Barbette Merino MD [facility] Pager [phone]  If 7PM-7AM, please contact night-coverage [email or web address] Password [facility]  07/05/2019, 7:29 PM

## 2019-07-05 NOTE — ED Notes (Signed)
Attempted to call report. RN unable to take report at this time. RN requesting to call back in 10 mins

## 2019-07-05 NOTE — ED Notes (Signed)
Patient has a port and would like the blood to be drawn out of port

## 2019-07-05 NOTE — Progress Notes (Signed)
ED TO INPATIENT HANDOFF REPORT  ED Nurse Name and Phone #: [name] [phone]  S Name/Age/Gender [name] [name] 74 y.o. female Room/Bed: WA10/WA10  Code Status   Code Status: Prior  Home/SNF/Other Home Patient oriented to: self, place, time and situation Is this baseline? Yes   Triage Complete: Triage complete  Chief Complaint Generalized weakness [R53.1]  Triage Note Per pt, states she had colon cancer surgery this past summer-states her oncologist told her she was in remission-states for the past 3 weeks she has been falling a lot, weak, no eating and having diarrhea-her husband talked to her nurse at cancer center at they told her to ocme to ED for evaluation     Allergies Allergies  Allergen Reactions  . Guaifenesin Er Palpitations  . Lotensin [Benazepril Hcl] Other (See Comments)    Took off when patient had stroke  . Propoxyphene Palpitations    Level of Care/Admitting Diagnosis ED Disposition    ED Disposition Condition Comment   Admit  Hospital Area: [facility] [[id number]]  Level of Care: Med-Surg [16]  Covid Evaluation: Asymptomatic Screening Protocol (No Symptoms)  Diagnosis: Generalized weakness [id number]  Admitting Physician: Elwyn Reach [[id number]]  Attending Physician: Elwyn Reach [[id number]]  Estimated length of stay: past midnight tomorrow  Certification:: I certify this patient will need inpatient services for at least 2 midnights       B Medical/Surgery History Past Medical History:  Diagnosis Date  . Cancer of right colon ([place])   . Constipation   . Coronary artery disease   . CVA (cerebral infarction) 1996  . Dyspnea    going up stairs and hills  . Family history of colon cancer   . GERD (gastroesophageal reflux disease)   . History of cardiomegaly   . Hyperlipidemia   . Hypertension   . Iron deficiency anemia   . Myocardial infarction ([place]) 2017  . NSTEMI (non-ST elevated myocardial infarction)  ([place])   . Personal history of colonic polyps   . PONV (postoperative nausea and vomiting)   . Right shoulder pain    more painful at night , cold makes it worse  . Severe obesity ([place])    Past Surgical History:  Procedure Laterality Date  . ABDOMINAL HYSTERECTOMY    . BACK SURGERY     x2  . CARDIAC CATHETERIZATION N/A 02/28/2016   Procedure: Left Heart Cath and Coronary Angiography;  Surgeon: Peter M Martinique, MD;  Location: [facility];  Service: Cardiovascular;  Laterality: N/A;  . CARDIAC CATHETERIZATION N/A 02/28/2016   Procedure: Coronary Stent Intervention;  Surgeon: Peter M Martinique, MD;  Location: [facility];  Service: Cardiovascular;  Laterality: N/A;  . CHOLECYSTECTOMY    . COLONOSCOPY  11/24/2018  . CORONARY ANGIOPLASTY    . CYST EXCISION Left    Cheek  . LAPAROSCOPIC RIGHT HEMI COLECTOMY Right 01/17/2019   Procedure: LAPAROSCOPIC RIGHT HEMI COLECTOMY;  Surgeon: Alphonsa Overall, MD;  Location: [facility];  Service: General;  Laterality: Right;  . NASAL FRACTURE SURGERY    . PORTACATH PLACEMENT N/A 02/22/2019   Procedure: POWER PORT PLACEMENT;  Surgeon: Alphonsa Overall, MD;  Location: [facility];  Service: General;  Laterality: N/A;  . UPPER GI ENDOSCOPY  11/24/2018     A IV Location/Drains/Wounds Patient Lines/Drains/Airways Status   Active Line/Drains/Airways    Name:   Placement date:   Placement time:   Site:   Days:   Implanted Port 02/22/19 Left Chest   02/22/19  [id number]    Chest   133   Peripheral IV 02/22/19 Left Wrist   02/22/19    0755    Wrist   133   Peripheral IV 07/05/19 Right Antecubital   07/05/19    1626    Antecubital   less than 1   Incision (Closed) 01/17/19 Abdomen Other (Comment)   01/17/19    0959     169   Incision (Closed) 02/22/19 Chest Left   02/22/19    0913     133   Incision - 4 Ports Abdomen Right;Mid Left;Lower Left;Mid Left;Upper   01/17/19    0755     169          Intake/Output Last 24 hours No intake or output data in the 24 hours  ending 07/05/19 2019  Labs/Imaging Results for orders placed or performed during the hospital encounter of 07/05/19 (from the past 48 hour(s))  Urinalysis, Routine w reflex microscopic     Status: Abnormal   Collection Time: 07/05/19  3:38 PM  Result Value Ref Range   Color, Urine YELLOW YELLOW   APPearance CLEAR CLEAR   Specific Gravity, Urine 1.009 1.005 - 1.030   pH 5.0 5.0 - 8.0   Glucose, UA NEGATIVE NEGATIVE mg/dL   Hgb urine dipstick NEGATIVE NEGATIVE   Bilirubin Urine NEGATIVE NEGATIVE   Ketones, ur 5 (A) NEGATIVE mg/dL   Protein, ur NEGATIVE NEGATIVE mg/dL   Nitrite NEGATIVE NEGATIVE   Leukocytes,Ua SMALL (A) NEGATIVE   RBC / HPF 0-5 0 - 5 RBC/hpf   WBC, UA 11-20 0 - 5 WBC/hpf   Bacteria, UA RARE (A) NONE SEEN   Squamous Epithelial / LPF 0-5 0 - 5   Mucus PRESENT    Hyaline Casts, UA PRESENT     Comment: Performed at [facility], [place] [address] [place], [place] [address]  Hepatic function panel     Status: None   Collection Time: 07/05/19  4:17 PM  Result Value Ref Range   Total Protein 7.2 6.5 - 8.1 g/dL   Albumin 3.5 3.5 - 5.0 g/dL   AST 24 15 - 41 U/L   ALT 18 0 - 44 U/L   Alkaline Phosphatase 81 38 - 126 U/L   Total Bilirubin 1.0 0.3 - 1.2 mg/dL   Bilirubin, Direct 0.1 0.0 - 0.2 mg/dL   Indirect Bilirubin 0.9 0.3 - 0.9 mg/dL    Comment: Performed at [facility], [place] [address] [place], [place] [id number]  Basic metabolic panel     Status: Abnormal   Collection Time: 07/05/19  4:26 PM  Result Value Ref Range   Sodium 137 135 - 145 mmol/L   Potassium 4.0 3.5 - 5.1 mmol/L   Chloride 101 98 - 111 mmol/L   CO2 24 22 - 32 mmol/L   Glucose, Bld 117 (H) 70 - 99 mg/dL   BUN 10 8 - 23 mg/dL   Creatinine, Ser 1.10 (H) 0.44 - 1.00 mg/dL   Calcium 10.2 8.9 - 10.3 mg/dL   GFR calc non Af Amer 50 (L) >60 mL/min   GFR calc Af Amer 58 (L) >60 mL/min   Anion gap 12 5 - 15    Comment: Performed at [facility],  [place] [address], [place], [place] [address]  CBC     Status: None   Collection Time: 07/05/19  4:26 PM  Result Value Ref Range   WBC 7.8 4.0 - 10.5 K/uL  RBC 4.14 3.87 - 5.11 MIL/uL   Hemoglobin 13.4 12.0 - 15.0 g/dL   HCT 40.5 36.0 - 46.0 %   MCV 97.8 80.0 - 100.0 fL   MCH 32.4 26.0 - 34.0 pg   MCHC 33.1 30.0 - 36.0 g/dL   RDW 14.5 11.5 - 15.5 %   Platelets 260 150 - 400 K/uL   nRBC 0.0 0.0 - 0.2 %    Comment: Performed at [facility], [place] [address], [place], [place] [address]  Respiratory Panel by RT PCR (Flu A&B, Covid) - Nasopharyngeal Swab     Status: None   Collection Time: 07/05/19  4:32 PM   Specimen: Nasopharyngeal Swab  Result Value Ref Range   SARS Coronavirus 2 by RT PCR NEGATIVE NEGATIVE    Comment: (NOTE) SARS-CoV-2 target nucleic acids are NOT DETECTED. The SARS-CoV-2 RNA is generally detectable in upper respiratoy specimens during the acute phase of infection. The lowest concentration of SARS-CoV-2 viral copies this assay can detect is 131 copies/mL. A negative result does not preclude SARS-Cov-2 infection and should not be used as the sole basis for treatment or other patient management decisions. A negative result may occur with  improper specimen collection/handling, submission of specimen other than nasopharyngeal swab, presence of viral mutation(s) within the areas targeted by this assay, and inadequate number of viral copies (<131 copies/mL). A negative result must be combined with clinical observations, patient history, and epidemiological information. The expected result is Negative. Fact Sheet for Patients:  PinkCheek.be Fact Sheet for Healthcare Providers:  GravelBags.it This test is not yet ap proved or cleared by the [place] FDA and  has been authorized for detection and/or diagnosis of SARS-CoV-2 by FDA under an Emergency Use Authorization (EUA). This EUA will  remain  in effect (meaning this test can be used) for the duration of the COVID-19 declaration under Section 564(b)(1) of the Act, 21 U.S.C. section 360bbb-3(b)(1), unless the authorization is terminated or revoked sooner.    Influenza A by PCR NEGATIVE NEGATIVE   Influenza B by PCR NEGATIVE NEGATIVE    Comment: (NOTE) The Xpert Xpress SARS-CoV-2/FLU/RSV assay is intended as an aid in  the diagnosis of influenza from Nasopharyngeal swab specimens and  should not be used as a sole basis for treatment. Nasal washings and  aspirates are unacceptable for Xpert Xpress SARS-CoV-2/FLU/RSV  testing. Fact Sheet for Patients: PinkCheek.be Fact Sheet for Healthcare Providers: GravelBags.it This test is not yet approved or cleared by the [place] FDA and  has been authorized for detection and/or diagnosis of SARS-CoV-2 by  FDA under an Emergency Use Authorization (EUA). This EUA will remain  in effect (meaning this test can be used) for the duration of the  Covid-19 declaration under Section 564(b)(1) of the Act, 21  U.S.C. section 360bbb-3(b)(1), unless the authorization is  terminated or revoked. Performed at [facility], [place] [address] [place], [place] [address]    No results found.  Pending Labs Unresulted Labs (From admission, onward)    Start     Ordered   07/05/19 1617  GI pathogen panel by PCR, stool  (Gastrointestinal Panel by PCR, Stool                                                                                                                                                     *  Does Not include CLOSTRIDIUM DIFFICILE testing.**If CDIFF testing is needed, select the C Difficile Quick Screen w PCR reflex order below)  Once,   STAT    Question:  Patient immune status  Answer:  Normal   07/05/19 1617   07/05/19 1617  C Difficile Quick Screen w PCR reflex  (Gastrointestinal Panel by PCR, Stool                                                                                                                                                      *Does Not include CLOSTRIDIUM DIFFICILE testing.**If CDIFF testing is needed, select the C Difficile Quick Screen w PCR reflex order below)  Once, for 24 hours,   STAT     07/05/19 1617   Signed and Held  CBC  (enoxaparin (LOVENOX)    CrCl >/= 30 ml/min)  Once,   R    Comments: Baseline for enoxaparin therapy IF NOT ALREADY DRAWN.  Notify MD if PLT < 100 K.    Signed and Held   Signed and Held  Creatinine, serum  (enoxaparin (LOVENOX)    CrCl >/= 30 ml/min)  Once,   R    Comments: Baseline for enoxaparin therapy IF NOT ALREADY DRAWN.    Signed and Held   Signed and Held  Creatinine, serum  (enoxaparin (LOVENOX)    CrCl >/= 30 ml/min)  Weekly,   R    Comments: while on enoxaparin therapy    Signed and Held   Signed and Held  Comprehensive metabolic panel  Tomorrow morning,   R     Signed and Held   Signed and Held  CBC  Tomorrow morning,   R     Signed and Held          Vitals/Pain Today's Vitals   07/05/19 1845 07/05/19 1900 07/05/19 1940 07/05/19 1945  BP: (!) 170/88 (!) 159/87 (!) 162/91   Pulse: 88 84 88 88  Resp: 18 18  17   Temp:      TempSrc:      SpO2: 97% 97% 100% 98%  Weight:      Height:      PainSc:        Isolation Precautions Enteric precautions (UV disinfection)  Medications Medications - No data to display  Mobility walks High fall risk   Focused Assessments    R Recommendations: See Admitting Provider Note  Report given to: Dorthula Rue, RN  Additional Notes:

## 2019-07-05 NOTE — Progress Notes (Signed)
Attempted to get report, unable to get nurse on the phone. Will call back again.

## 2019-07-06 DIAGNOSIS — R531 Weakness: Secondary | ICD-10-CM | POA: Diagnosis not present

## 2019-07-06 DIAGNOSIS — H81319 Aural vertigo, unspecified ear: Secondary | ICD-10-CM | POA: Diagnosis not present

## 2019-07-06 LAB — COMPREHENSIVE METABOLIC PANEL
ALT: 13 U/L (ref 0–44)
AST: 19 U/L (ref 15–41)
Albumin: 2.9 g/dL — ABNORMAL LOW (ref 3.5–5.0)
Alkaline Phosphatase: 66 U/L (ref 38–126)
Anion gap: 7 (ref 5–15)
BUN: 10 mg/dL (ref 8–23)
CO2: 25 mmol/L (ref 22–32)
Calcium: 9.2 mg/dL (ref 8.9–10.3)
Chloride: 104 mmol/L (ref 98–111)
Creatinine, Ser: 1.11 mg/dL — ABNORMAL HIGH (ref 0.44–1.00)
GFR calc Af Amer: 57 mL/min — ABNORMAL LOW (ref >60)
GFR calc non Af Amer: 49 mL/min — ABNORMAL LOW (ref >60)
Glucose, Bld: 90 mg/dL (ref 70–99)
Potassium: 3.7 mmol/L (ref 3.5–5.1)
Sodium: 136 mmol/L (ref 135–145)
Total Bilirubin: 1 mg/dL (ref 0.3–1.2)
Total Protein: 5.9 g/dL — ABNORMAL LOW (ref 6.5–8.1)

## 2019-07-06 LAB — CLOSTRIDIUM DIFFICILE BY PCR, REFLEXED: Toxigenic C. Difficile by PCR: NEGATIVE

## 2019-07-06 LAB — CBC
HCT: 36.2 % (ref 36.0–46.0)
Hemoglobin: 11.5 g/dL — ABNORMAL LOW (ref 12.0–15.0)
MCH: 31.6 pg (ref 26.0–34.0)
MCHC: 31.8 g/dL (ref 30.0–36.0)
MCV: 99.5 fL (ref 80.0–100.0)
Platelets: 208 10*3/uL (ref 150–400)
RBC: 3.64 MIL/uL — ABNORMAL LOW (ref 3.87–5.11)
RDW: 14.5 % (ref 11.5–15.5)
WBC: 7 10*3/uL (ref 4.0–10.5)
nRBC: 0 % (ref 0.0–0.2)

## 2019-07-06 LAB — C DIFFICILE QUICK SCREEN W PCR REFLEX
C Diff antigen: POSITIVE — AB
C Diff toxin: NEGATIVE

## 2019-07-06 MED ORDER — TRAZODONE HCL 50 MG PO TABS
50.0000 mg | ORAL_TABLET | Freq: Once | ORAL | Status: AC
  Administered 2019-07-06: 50 mg via ORAL
  Filled 2019-07-06: qty 1

## 2019-07-06 NOTE — Progress Notes (Signed)
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 Grief reaction and significant stressors-patient was tearful discussing her cancer history and is fearful that this is "come back" I offered her a pastoral or chaplain visit which she declined We will see how she does over the course of the next day or so  No family present at this time  Subjective: Awake alert coherent Becomes tearful when dealing with issues regarding her cancer Tells me that she has been dizzy since the beginning of January with some changes to her medications No chest pain no fever No current diarrhea no nausea no vomiting Consultants:    Plan  Objective: Vitals:   07/05/19 1940 07/05/19 1945 07/05/19 2049 07/06/19 0554  BP: (!) 162/91  (!) 158/97 (!) 149/69  Pulse: 88 88 94 80  Resp:  17 18 16   Temp:   97.8 F (36.6 C) 97.6 F (36.4 C)  TempSrc:   Oral Oral  SpO2: 100% 98% 100% 97%  Weight:      Height:       No intake or output data in the 24 hours ending 07/06/19 0755 Filed Weights   07/05/19 1530  Weight: 81.2 kg    Examination: Awake alert tearful and labile S1-S2 no murmur rub or gallop Abdomen soft midline scar noted below umbilicus abdomen soft with slight tenderness right lower quadrant S1-S2 no murmur rub or gallop Neurologically intact no focal deficit Psych- somewhat labile   Scheduled Meds: . enoxaparin (LOVENOX) injection  40 mg Subcutaneous Q24H   Continuous Infusions: . sodium chloride 125 mL/hr at 07/05/19 2142     LOS: 1 day   Time spent: [place], MD [facility]  07/06/2019, 7:55 AM

## 2019-07-06 NOTE — Evaluation (Signed)
Physical Therapy Evaluation Patient Details Name: [name] MRN: [id number] DOB: 1945-06-11 Today's Date: 07/06/2019   History of Present Illness  74 y.o. female with medical history significant of right colon cancer, CVA, GERD, coronary artery disease, hypertension, hyperlipidemia and morbid obesity who has had progressive weakness and falls in the past.  Patient was brought in by family today due to worsening weakness progressively. Pt admitted for generalized weakness and recurrent falls  Clinical Impression  Pt admitted with above diagnosis.  Pt currently with functional limitations due to the deficits listed below (see PT Problem List). Pt will benefit from skilled PT to increase their independence and safety with mobility to allow discharge to the venue listed below.   Pt reports hx of "inner ear" since she was 74 year old.  Pt states she has issues with head movement and environment motion causing vertigo and would take over the counter or prescription strength medicine for symptoms.  Per pt's description of dizziness, BPPV not suspected however performed modified dix hallpike and horizontal canal testing to rule out, and pt without symptoms and no nystagmus observed. Attempted oculomotor testing however pt slow and has difficulty with eye movement to perform tests.   Pt also with lateral left leaning with oculomotor exam (smooth pursuits, saccades) and unable to perform head shaking.  Pt feels she can return home with use of RW and assist from family.  Recommend OP Neuro PT for further exam and treatment of vestibular issues and balance deficits.  Orthostatics also obtained and in docflowsheets (not positive, high BP).     Follow Up Recommendations Outpatient PT;Supervision/Assistance - 24 hour(OP neurorehab)    Equipment Recommendations  None recommended by PT    Recommendations for Other Services       Precautions / Restrictions Precautions Precautions: Fall       Mobility  Bed Mobility Overal bed mobility: Needs Assistance Bed Mobility: Supine to Sit;Sit to Supine     Supine to sit: Supervision Sit to supine: Min assist   General bed mobility comments: some assist for LEs onto bed  Transfers Overall transfer level: Needs assistance Equipment used: Rolling walker (2 wheeled) Transfers: Sit to/from Stand Sit to Stand: Min guard         General transfer comment: for safety, cues for hand placement, increased time and effort  Ambulation/Gait Ambulation/Gait assistance: Min guard Gait Distance (Feet): 12 Feet Assistive device: Rolling walker (2 wheeled) Gait Pattern/deviations: Step-through pattern;Decreased stride length     General Gait Details: cues for use of RW for steadying assist, slow pace, ambulated around bed, denies dizziness  Stairs            Wheelchair Mobility    Modified Rankin (Stroke Patients Only)       Balance Overall balance assessment: History of Falls;Needs assistance     Sitting balance - Comments: Pt leans to left with challenges to vision and was not aware   Standing balance support: Bilateral upper extremity supported Standing balance-Leahy Scale: Poor Standing balance comment: requiring UE support                             Pertinent Vitals/Pain Pain Assessment: No/denies pain    Home Living Family/patient expects to be discharged to:: Private residence Living Arrangements: Spouse/significant other;Children Available Help at Discharge: Family;Available 24 hours/day Type of Home: House       Home Layout: Able to live on main level with bedroom/bathroom Home  Equipment: Gilford Rile - 4 wheels      Prior Function Level of Independence: Independent with assistive device(s)               Hand Dominance        Extremity/Trunk Assessment        Lower Extremity Assessment Lower Extremity Assessment: Generalized weakness    Cervical / Trunk  Assessment Cervical / Trunk Assessment: Normal  Communication   Communication: No difficulties  Cognition Arousal/Alertness: Awake/alert Behavior During Therapy: WFL for tasks assessed/performed Overall Cognitive Status: Within Functional Limits for tasks assessed                                 General Comments: becomes emotional when talking about cancer history      General Comments      Exercises     Assessment/Plan    PT Assessment Patient needs continued PT services  PT Problem List Decreased strength;Decreased mobility;Decreased activity tolerance;Decreased balance;Decreased knowledge of use of DME       PT Treatment Interventions DME instruction;Therapeutic exercise;Gait training;Balance training;Functional mobility training;Therapeutic activities;Patient/family education;Neuromuscular re-education    PT Goals (Current goals can be found in the Care Plan section)  Acute Rehab PT Goals PT Goal Formulation: With patient Time For Goal Achievement: 07/21/19 Potential to Achieve Goals: Good    Frequency Min 3X/week   Barriers to discharge        Co-evaluation               AM-PAC PT "6 Clicks" Mobility  Outcome Measure Help needed turning from your back to your side while in a flat bed without using bedrails?: A Little Help needed moving from lying on your back to sitting on the side of a flat bed without using bedrails?: A Little Help needed moving to and from a bed to a chair (including a wheelchair)?: A Little Help needed standing up from a chair using your arms (e.g., wheelchair or bedside chair)?: A Little Help needed to walk in hospital room?: A Little Help needed climbing 3-5 steps with a railing? : A Lot 6 Click Score: 17    End of Session Equipment Utilized During Treatment: Gait belt Activity Tolerance: Patient tolerated treatment well Patient left: in bed;with call bell/phone within reach;with bed alarm set Nurse Communication:  Mobility status PT Visit Diagnosis: Other abnormalities of gait and mobility (R26.89);History of falling (Z91.81)    Time: [id number] PT Time Calculation (min) (ACUTE ONLY): 43 min   Charges:   PT Evaluation $PT Eval Moderate Complexity: 1 Mod PT Treatments $Gait Training: 8-22 mins $Therapeutic Activity: 8-22 mins       Arlyce Dice, DPT Acute Rehabilitation Services Office: [phone]    York Ram E 07/06/2019, 3:28 PM

## 2019-07-07 DIAGNOSIS — R531 Weakness: Secondary | ICD-10-CM | POA: Diagnosis not present

## 2019-07-07 DIAGNOSIS — H81319 Aural vertigo, unspecified ear: Secondary | ICD-10-CM | POA: Diagnosis not present

## 2019-07-07 LAB — CBC WITH DIFFERENTIAL/PLATELET
Abs Immature Granulocytes: 0.01 10*3/uL (ref 0.00–0.07)
Basophils Absolute: 0 10*3/uL (ref 0.0–0.1)
Basophils Relative: 1 %
Eosinophils Absolute: 0.1 10*3/uL (ref 0.0–0.5)
Eosinophils Relative: 2 %
HCT: 35.1 % — ABNORMAL LOW (ref 36.0–46.0)
Hemoglobin: 11.1 g/dL — ABNORMAL LOW (ref 12.0–15.0)
Immature Granulocytes: 0 %
Lymphocytes Relative: 32 %
Lymphs Abs: 1.5 10*3/uL (ref 0.7–4.0)
MCH: 31.3 pg (ref 26.0–34.0)
MCHC: 31.6 g/dL (ref 30.0–36.0)
MCV: 98.9 fL (ref 80.0–100.0)
Monocytes Absolute: 0.3 10*3/uL (ref 0.1–1.0)
Monocytes Relative: 7 %
Neutro Abs: 2.8 10*3/uL (ref 1.7–7.7)
Neutrophils Relative %: 58 %
Platelets: 174 10*3/uL (ref 150–400)
RBC: 3.55 MIL/uL — ABNORMAL LOW (ref 3.87–5.11)
RDW: 14.2 % (ref 11.5–15.5)
WBC: 4.7 10*3/uL (ref 4.0–10.5)
nRBC: 0 % (ref 0.0–0.2)

## 2019-07-07 LAB — BASIC METABOLIC PANEL
Anion gap: 9 (ref 5–15)
BUN: 8 mg/dL (ref 8–23)
CO2: 22 mmol/L (ref 22–32)
Calcium: 9 mg/dL (ref 8.9–10.3)
Chloride: 109 mmol/L (ref 98–111)
Creatinine, Ser: 0.91 mg/dL (ref 0.44–1.00)
GFR calc Af Amer: >60 mL/min (ref >60)
GFR calc non Af Amer: >60 mL/min (ref >60)
Glucose, Bld: 98 mg/dL (ref 70–99)
Potassium: 3.5 mmol/L (ref 3.5–5.1)
Sodium: 140 mmol/L (ref 135–145)

## 2019-07-07 NOTE — TOC Transition Note (Signed)
Transition of Care [facility]) - CM/SW Discharge Note   Patient Details  Name: [name] MRN: [id number] Date of Birth: 1945/12/29  [facility] CM/SW Contact:  Lynnell Catalan, RN Phone Number: 07/07/2019, 9:38 AM   Clinical Narrative:    Outpatient physical therapy referral placed in Epic per [facility] consult.

## 2019-07-07 NOTE — Discharge Summary (Signed)
Physician Discharge Summary  [name] [id number] DOB: 05-May-1946 DOA: 07/05/2019  PCP: Chesley Noon, MD  Admit date: 07/05/2019 Discharge date: 07/07/2019  Time spent: 35 minutes  Recommendations for Outpatient Follow-up:  1. Recommend outpatient follow-up for diarrhea if persisting can use empiric 2. Needs Chem-12, CBC 1 week 3. Needs outpatient further follow-up with oncologist persisting issues improve 4. 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   Hypertension   Hyperlipidemia   GERD (gastroesophageal reflux disease)   Diarrhea   Fall at home, initial encounter   Discharge Condition: Improved  Diet recommendation: Heart healthy low-salt  Filed Weights   07/05/19 1530 07/07/19 0607  Weight: 81.2 kg 83.9 kg    History of present illness:  80 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 Benay Spice needed but can use Imodium as above short-term  Discharge Exam: Vitals:   07/06/19 2233 07/07/19 0607  BP: (!) 155/74 (!) 169/79  Pulse: 76 74  Resp: 17 17  Temp: 98.2 F (36.8 C) 98.5 F (36.9 C)  SpO2: 95% 99%    General: Awake alert coherent no distress at breakfast Cardiovascular: S1-S2 no murmur rub or gallop Respiratory: Clinically clear no rales no rhonchi soft no rebound no lower extremity edema Has ingrown toenail on the left side  Discharge Instructions   Discharge Instructions    Diet - low sodium heart healthy   Complete by: As directed    Discharge instructions   Complete by: As directed    You will notice that some your medications have changed slightly-I have discontinued your HCTZ It was felt that however your symptoms were not consistent with blood pressure related dizziness rather it could have been dizziness from your inner ear and therapy recommended outpatient rehab which we will try to coordinate on discharge Please follow-up with your primary care physician continue your usual medications as otherwise taken previously Recommend also may be a discussion regarding anxiety in the outpatient setting   Discharge instructions   Complete by: As directed    Recommend outpatient management of diarrhea-do not think it is infectious and if you have further diarrhea beyond a week then I would suggest discussion with Dr. Alen Blew about further management and may be imaging studies although at this time it  is unlikely that it is infectious and you do not need any antibiotics We will arrange for you to have outpatient therapy for your inner ear issues and dizziness Would recommend labs in about a week and you will notice that some your blood pressure  medications have changed   Increase activity slowly   Complete by: As directed    Increase activity slowly   Complete by: As directed      Allergies as of 07/07/2019      Reactions   Guaifenesin Er Palpitations   Lotensin [benazepril Hcl] Other (See Comments)   Took off when patient had stroke   Propoxyphene Palpitations      Medication List    STOP taking these medications   cholecalciferol 25 MCG (1000 UNIT) tablet Commonly known as: VITAMIN D   hydrochlorothiazide 25 MG tablet Commonly known as: HYDRODIURIL   lidocaine-prilocaine cream Commonly known as: EMLA   omeprazole 40 MG capsule Commonly known as: PRILOSEC   ondansetron 8 MG tablet Commonly known as: ZOFRAN   potassium chloride 10 MEQ tablet Commonly known as: KLOR-CON   prochlorperazine 10 MG tablet Commonly known as: COMPAZINE   traMADol 50 MG tablet Commonly known as: Ultram     TAKE these medications   amLODipine 5 MG tablet Commonly known as: NORVASC Take 5 mg by mouth daily.   ezetimibe 10 MG tablet Commonly known as: ZETIA Take 1 tablet by mouth once daily   loperamide 2 MG capsule Commonly known as: IMODIUM Take 2-4 mg by mouth 4 (four) times daily as needed for diarrhea or loose stools.   losartan 100 MG tablet Commonly known as: COZAAR Take 100 mg by mouth daily.      Allergies  Allergen Reactions  . Guaifenesin Er Palpitations  . Lotensin [Benazepril Hcl] Other (See Comments)    Took off when patient had stroke  . Propoxyphene Palpitations      The results of significant diagnostics from this hospitalization (including imaging, microbiology, ancillary and laboratory) are listed below for reference.    Significant Diagnostic Studies: No results found.  Microbiology: Recent Results (from the past 240 hour(s))  Respiratory Panel by RT PCR (Flu A&B, Covid) - Nasopharyngeal Swab     Status: None   Collection Time: 07/05/19  4:32 PM   Specimen: Nasopharyngeal Swab  Result  Value Ref Range Status   SARS Coronavirus 2 by RT PCR NEGATIVE NEGATIVE Final    Comment: (NOTE) SARS-CoV-2 target nucleic acids are NOT DETECTED. The SARS-CoV-2 RNA is generally detectable in upper respiratoy specimens during the acute phase of infection. The lowest concentration of SARS-CoV-2 viral copies this assay can detect is 131 copies/mL. A negative result does not preclude SARS-Cov-2 infection and should not be used as the sole basis for treatment or other patient management decisions. A negative result may occur with  improper specimen collection/handling, submission of specimen other than nasopharyngeal swab, presence of viral mutation(s) within the areas targeted by this assay, and inadequate number of viral copies (<131 copies/mL). A negative result must be combined with clinical observations, patient history, and epidemiological information. The expected result is Negative. Fact Sheet for Patients:  PinkCheek.be Fact Sheet for Healthcare Providers:  GravelBags.it This test is not yet ap proved or cleared by the [place] FDA and  has been authorized for detection and/or diagnosis of SARS-CoV-2 by FDA under an Emergency Use Authorization (EUA). This EUA will remain  in effect (meaning this test can be used) for the duration of  the COVID-19 declaration under Section 564(b)(1) of the Act, 21 U.S.C. section 360bbb-3(b)(1), unless the authorization is terminated or revoked sooner.    Influenza A by PCR NEGATIVE NEGATIVE Final   Influenza B by PCR NEGATIVE NEGATIVE Final    Comment: (NOTE) The Xpert Xpress SARS-CoV-2/FLU/RSV assay is intended as an aid in  the diagnosis of influenza from Nasopharyngeal swab specimens and  should not be used as a sole basis for treatment. Nasal washings and  aspirates are unacceptable for Xpert Xpress SARS-CoV-2/FLU/RSV  testing. Fact Sheet for  Patients: PinkCheek.be Fact Sheet for Healthcare Providers: GravelBags.it This test is not yet approved or cleared by the [place] FDA and  has been authorized for detection and/or diagnosis of SARS-CoV-2 by  FDA under an Emergency Use Authorization (EUA). This EUA will remain  in effect (meaning this test can be used) for the duration of the  Covid-19 declaration under Section 564(b)(1) of the Act, 21  U.S.C. section 360bbb-3(b)(1), unless the authorization is  terminated or revoked. Performed at [facility], [place] [address], [place], [place] [address]   C Difficile Quick Screen w PCR reflex     Status: Abnormal   Collection Time: 07/06/19 12:30 PM   Specimen: STOOL  Result Value Ref Range Status   C Diff antigen POSITIVE (A) NEGATIVE Final   C Diff toxin NEGATIVE NEGATIVE Final   C Diff interpretation Results are indeterminate. See PCR results.  Final    Comment: Performed at [facility], [place] [address], [place], [place] [address]  C. Diff by PCR, Reflexed     Status: None   Collection Time: 07/06/19 12:30 PM  Result Value Ref Range Status   Toxigenic C. Difficile by PCR NEGATIVE NEGATIVE Final    Comment: Patient is colonized with non toxigenic C. difficile. May not need treatment unless significant symptoms are present. Performed at [facility], [place] [address] [place], [place] [address]      Labs: Basic Metabolic Panel: Recent Labs  Lab 07/05/19 1626 07/06/19 0451 07/07/19 0742  NA 137 136 140  K 4.0 3.7 3.5  CL 101 104 109  CO2 24 25 22   GLUCOSE 117* 90 98  BUN 10 10 8   CREATININE 1.10* 1.11* 0.91  CALCIUM 10.2 9.2 9.0   Liver Function Tests: Recent Labs  Lab 07/05/19 1617 07/06/19 0451  AST 24 19  ALT 18 13  ALKPHOS 81 66  BILITOT 1.0 1.0  PROT 7.2 5.9*  ALBUMIN 3.5 2.9*   No results for input(s): LIPASE, AMYLASE in the last 168 hours. No  results for input(s): AMMONIA in the last 168 hours. CBC: Recent Labs  Lab 07/05/19 1626 07/06/19 0451 07/07/19 0742  WBC 7.8 7.0 4.7  NEUTROABS  --   --  2.8  HGB 13.4 11.5* 11.1*  HCT 40.5 36.2 35.1*  MCV 97.8 99.5 98.9  PLT 260 208 174   Cardiac Enzymes: No results for input(s): CKTOTAL, CKMB, CKMBINDEX, TROPONINI in the last 168 hours. BNP: BNP (last 3 results) No results for input(s): BNP in the last 8760 hours.  ProBNP (last 3 results) No results for input(s): PROBNP in the last 8760 hours.  CBG: No results for input(s): GLUCAP in the last 168 hours.     Signed:  Nita Sells MD   [facility] 07/07/2019, 8:41 AM

## 2019-07-07 NOTE — Care Management Important Message (Signed)
Important Message  Patient Detail IM Letter given to Marney Doctor RN Case Manager to present to the Patient                                                  Name: [name] MRN: [id number] Date of Birth: 09/22/1945   Medicare Important Message Given:  Yes     Kerin Salen 07/07/2019, 9:34 AM

## 2019-07-07 NOTE — Progress Notes (Signed)
Physical Therapy Treatment Patient Details Name: [name] MRN: [id number] DOB: 04/13/1946 Today's Date: 07/07/2019    History of Present Illness 74 y.o. female with medical history significant of right colon cancer, CVA, GERD, coronary artery disease, hypertension, hyperlipidemia and morbid obesity who has had progressive weakness and falls in the past.  Patient was brought in by family today due to worsening weakness progressively. Pt admitted for generalized weakness and recurrent falls    PT Comments    Pt assisted with ambulating in hallway and present as min/guard for mobility.  Left lateral trunk leaning present in sitting and during ambulation.  Pt provided with referral handout for OP NeuroPT.  Pt to d/c home today.    Follow Up Recommendations  Outpatient PT;Supervision/Assistance - 24 hour     Equipment Recommendations  None recommended by PT    Recommendations for Other Services       Precautions / Restrictions Precautions Precautions: Fall    Mobility  Bed Mobility Overal bed mobility: Needs Assistance Bed Mobility: Supine to Sit     Supine to sit: Supervision        Transfers Overall transfer level: Needs assistance Equipment used: Rolling walker (2 wheeled) Transfers: Sit to/from Stand Sit to Stand: Min guard         General transfer comment: for safety, cues for hand placement, increased time and effort  Ambulation/Gait Ambulation/Gait assistance: Min guard Gait Distance (Feet): 120 Feet Assistive device: Rolling walker (2 wheeled) Gait Pattern/deviations: Step-through pattern;Decreased stride length Gait velocity: decr   General Gait Details: cues for use of RW for steadying assist, slow pace, ambulated around bed, denies dizziness, increased lateral left trunk leaning, able to correct when stopped in standing and manual cues, pt does feel leaning to left with ambulation but not when in sitting (present for both)   Stairs             Wheelchair Mobility    Modified Rankin (Stroke Patients Only)       Balance                                            Cognition Arousal/Alertness: Awake/alert Behavior During Therapy: WFL for tasks assessed/performed Overall Cognitive Status: Within Functional Limits for tasks assessed                                        Exercises      General Comments        Pertinent Vitals/Pain      Home Living                      Prior Function            PT Goals (current goals can now be found in the care plan section) Progress towards PT goals: Progressing toward goals    Frequency    Min 3X/week      PT Plan Current plan remains appropriate    Co-evaluation              AM-PAC PT "6 Clicks" Mobility   Outcome Measure  Help needed turning from your back to your side while in a flat bed without using bedrails?: A Little Help needed moving from lying on your back to  sitting on the side of a flat bed without using bedrails?: A Little Help needed moving to and from a bed to a chair (including a wheelchair)?: A Little Help needed standing up from a chair using your arms (e.g., wheelchair or bedside chair)?: A Little Help needed to walk in hospital room?: A Little Help needed climbing 3-5 steps with a railing? : A Lot 6 Click Score: 17    End of Session Equipment Utilized During Treatment: Gait belt Activity Tolerance: Patient tolerated treatment well Patient left: in chair;with call bell/phone within reach(with OT)   PT Visit Diagnosis: Other abnormalities of gait and mobility (R26.89);History of falling (Z91.81)     Time: [id number] PT Time Calculation (min) (ACUTE ONLY): 16 min  Charges:  $Gait Training: 8-22 mins                     Arlyce Dice, DPT Acute Rehabilitation Services Office: [phone]  Elaf Clauson,KATHrine E 07/07/2019, 3:00 PM

## 2019-07-07 NOTE — Evaluation (Signed)
Occupational Therapy Evaluation Patient Details Name: [name] MRN: [id number] DOB: 23-Jun-1945 Today'Thomas Date: 07/07/2019    History of Present Illness 74 y.o. female with medical history significant of right colon cancer, CVA, GERD, coronary artery disease, hypertension, hyperlipidemia and morbid obesity who has had progressive weakness and falls in the past.  Patient was brought in by family today due to worsening weakness progressively. Pt admitted for generalized weakness and recurrent falls   Clinical Impression   Pt was admitted for the above.  Pt saw her for vestibular eval, and pt has h/o inner ear problems since age 28. She reports that she had a rough month in Dec and has never returned to baseline. She has 24/7 assist from family at home.  Will follow in acute setting with supervision level goals. Pt needs min guard to stand and up to mod A for LB adls; this is due to fatique.  All activities take extra time and effort    Follow Up Recommendations  (PT recommends OP vestibular) Would benefit from [facility] services, if she doesn't go to OP   Equipment Recommendations  None recommended by OT    Recommendations for Other Services       Precautions / Restrictions Precautions Precautions: Fall Restrictions Weight Bearing Restrictions: No      Mobility Bed Mobility               General bed mobility comments: oob  Transfers   Equipment used: Rolling walker (2 wheeled)   Sit to Stand: Min guard         General transfer comment: for safety, cues for hand placement, increased time and effort    Balance                                           ADL either performed or assessed with clinical judgement   ADL Overall ADL'Thomas : Needs assistance/impaired Eating/Feeding: Set up   Grooming: Set up   Upper Body Bathing: Set up   Lower Body Bathing: Moderate assistance   Upper Body Dressing : Set up Upper Body Dressing Details  (indicate cue type and reason): min A today 2* IV Lower Body Dressing: Moderate assistance                 General ADL Comments: pt had just finished walking with PT. Completed bathing and dressing. Pt needs extra time; fatiques easily.  Educated on energy conservation and gave her handouts.  ALso issued a gait belt.  Pt stated she has fallen getting out of shower before.       Vision         Perception     Praxis      Pertinent Vitals/Pain Pain Assessment: No/denies pain     Hand Dominance     Extremity/Trunk Assessment Upper Extremity Assessment Upper Extremity Assessment: Generalized weakness           Communication Communication Communication: No difficulties   Cognition Arousal/Alertness: Awake/alert Behavior During Therapy: WFL for tasks assessed/performed Overall Cognitive Status: No family/caregiver present to determine baseline cognitive functioning                                 General Comments: WFLs during session. After session, pt told NT that OT took clothes out of her [employer]  bag.  I took clothes from hospital bag which she wore in. She had stated, those aren't clean, but they are OK   General Comments       Exercises     Shoulder Instructions      Home Living Family/patient expects to be discharged to:: Private residence Living Arrangements: Spouse/significant other;Children Available Help at Discharge: Family;Available 24 hours/day Type of Home: House       Home Layout: Able to live on main level with bedroom/bathroom     Bathroom Shower/Tub: Teacher, early years/pre: Standard     Home Equipment: Clinical cytogeneticist - 2 wheels          Prior Functioning/Environment Level of Independence: Independent with assistive device(Thomas)                 OT Problem List: Decreased strength;Decreased activity tolerance;Impaired balance (sitting and/or standing);Decreased cognition;Decreased knowledge of  use of DME or AE      OT Treatment/Interventions: Self-care/ADL training;DME and/or AE instruction;Patient/family education;Balance training;Cognitive remediation/compensation;Therapeutic activities    OT Goals(Current goals can be found in the care plan section) Acute Rehab OT Goals Patient Stated Goal: get back to baseline before she was sick in Dec OT Goal Formulation: With patient Time For Goal Achievement: 07/14/19 Potential to Achieve Goals: Good ADL Goals Additional ADL Goal #1: pt will complete toilet transfer and adl with supervision and set up (excluding socks/shoes)  OT Frequency: Min 2X/week   Barriers to D/C:            Co-evaluation              AM-PAC OT "6 Clicks" Daily Activity     Outcome Measure Help from another person eating meals?: A Little Help from another person taking care of personal grooming?: A Little Help from another person toileting, which includes using toliet, bedpan, or urinal?: A Little Help from another person bathing (including washing, rinsing, drying)?: A Lot Help from another person to put on and taking off regular upper body clothing?: A Little Help from another person to put on and taking off regular lower body clothing?: A Lot 6 Click Score: 16   End of Session    Activity Tolerance: Patient tolerated treatment well Patient left: in chair;with call bell/phone within reach  OT Visit Diagnosis: Unsteadiness on feet (R26.81);Muscle weakness (generalized) (M62.81)                Time: [id number] OT Time Calculation (min): 32 min Charges:  OT General Charges $OT Visit: 1 Visit OT Evaluation $OT Eval Low Complexity: 1 Low OT Treatments $Self Care/Home Management : 8-22 mins  [name], OTR/L Acute Rehabilitation Services 07/07/2019  [name] 07/07/2019, 12:09 PM

## 2019-07-10 LAB — GI PATHOGEN PANEL BY PCR, STOOL

## 2019-07-12 ENCOUNTER — Ambulatory Visit: Payer: Medicare Other | Admitting: Physical Therapy

## 2019-07-13 ENCOUNTER — Ambulatory Visit: Payer: Medicare Other

## 2019-07-19 ENCOUNTER — Other Ambulatory Visit: Payer: Self-pay

## 2019-07-19 ENCOUNTER — Ambulatory Visit: Payer: Medicare Other | Attending: Family Medicine | Admitting: Physical Therapy

## 2019-07-19 DIAGNOSIS — R208 Other disturbances of skin sensation: Secondary | ICD-10-CM

## 2019-07-19 DIAGNOSIS — R296 Repeated falls: Secondary | ICD-10-CM | POA: Diagnosis present

## 2019-07-19 DIAGNOSIS — M6281 Muscle weakness (generalized): Secondary | ICD-10-CM | POA: Diagnosis present

## 2019-07-19 DIAGNOSIS — R262 Difficulty in walking, not elsewhere classified: Secondary | ICD-10-CM | POA: Diagnosis present

## 2019-07-19 DIAGNOSIS — R42 Dizziness and giddiness: Secondary | ICD-10-CM | POA: Diagnosis present

## 2019-07-19 DIAGNOSIS — R293 Abnormal posture: Secondary | ICD-10-CM

## 2019-07-19 DIAGNOSIS — R209 Unspecified disturbances of skin sensation: Secondary | ICD-10-CM | POA: Insufficient documentation

## 2019-07-19 DIAGNOSIS — R2681 Unsteadiness on feet: Secondary | ICD-10-CM | POA: Insufficient documentation

## 2019-07-19 NOTE — Therapy (Addendum)
[facility] [address] [place], [place], [address] Phone: [phone]   Fax:  [phone]  Physical Therapy Evaluation  Patient Details  Name: [name] MRN: [id number] Date of Birth: 04/04/1946 Referring Provider (PT): Referred by [facility] physician; certification will be sent to PCP - Chesley Noon, MD   Encounter Date: 07/19/2019  PT End of Session - 07/19/19 1646    Visit Number  1    Number of Visits  9    Date for PT Re-Evaluation  09/17/19    Authorization Type  [employer] $40 copay.  10th visit PN    PT Start Time  1458    PT Stop Time  1540    PT Time Calculation (min)  42 min    Activity Tolerance  Patient tolerated treatment well    Behavior During Therapy  WFL for tasks assessed/performed       Past Medical History:  Diagnosis Date  . Cancer of right colon ([place])   . Constipation   . Coronary artery disease   . CVA (cerebral infarction) 1996  . Dyspnea    going up stairs and hills  . Family history of colon cancer   . GERD (gastroesophageal reflux disease)   . History of cardiomegaly   . Hyperlipidemia   . Hypertension   . Iron deficiency anemia   . Myocardial infarction ([place]) 2017  . NSTEMI (non-ST elevated myocardial infarction) ([place])   . Personal history of colonic polyps   . PONV (postoperative nausea and vomiting)   . Right shoulder pain    more painful at night , cold makes it worse  . Severe obesity ([place])     Past Surgical History:  Procedure Laterality Date  . ABDOMINAL HYSTERECTOMY    . BACK SURGERY     x2  . CARDIAC CATHETERIZATION N/A 02/28/2016   Procedure: Left Heart Cath and Coronary Angiography;  Surgeon: Peter M Martinique, MD;  Location: [facility];  Service: Cardiovascular;  Laterality: N/A;  . CARDIAC CATHETERIZATION N/A 02/28/2016   Procedure: Coronary Stent Intervention;  Surgeon: Peter M Martinique, MD;  Location: [facility];  Service:  Cardiovascular;  Laterality: N/A;  . CHOLECYSTECTOMY    . COLONOSCOPY  11/24/2018  . CORONARY ANGIOPLASTY    . CYST EXCISION Left    Cheek  . LAPAROSCOPIC RIGHT HEMI COLECTOMY Right 01/17/2019   Procedure: LAPAROSCOPIC RIGHT HEMI COLECTOMY;  Surgeon: Alphonsa Overall, MD;  Location: [facility];  Service: General;  Laterality: Right;  . NASAL FRACTURE SURGERY    . PORTACATH PLACEMENT N/A 02/22/2019   Procedure: POWER PORT PLACEMENT;  Surgeon: Alphonsa Overall, MD;  Location: [facility];  Service: General;  Laterality: N/A;  . UPPER GI ENDOSCOPY  11/24/2018    There were no vitals filed for this visit.   Subjective Assessment - 07/19/19 1503    Subjective  Has had 5 falls at home due to LOB when leaning forwards - unable to get herself up.  Was admitted to the hospital and treated for dehydration - no falls since D/C home.  Referred for vestibular therapy.  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  Pt is very motion sensitive - has a hard time watching objects spin or a train go by.  Denies true vertigo.  Symptoms feel more like imbalance/disequilibrium - comes and goes.  Is  affected by the weather.  Denies headaches, denies sensitivity to light, sound or smell, denies changes in vision, pt does have some hearing loss but denies tinnitus.  Pt does have nausea but no vomiting.    Pertinent History  cancer of R colon with colectomy and chemotherapy, constipation, CAD, CVA, cardiomegaly, HLD, HTN, anemia, NSTEMI, obesity, chronic back pain after back surgery, and a history of motion sickness    Limitations  Standing;Walking;House hold activities    Patient Stated Goals  To get back on her feet and walk without feeling like she is going to fall - started last June after finding out she had colon CA - 18 inches removed + chemo.    Currently in Pain?  No/denies            07/19/19 1516  Assessment  Medical Diagnosis  Dizziness and deconditioning after Colon cancer/chemo  Referring Provider (PT) Referred by [facility] physician; certification will be sent to PCP - Chesley Noon, MD  Onset Date/Surgical Date 07/07/19  Prior Therapy yes  Precautions  Precautions Other (comment)  Precaution Comments cancer of R colon with colectomy and chemotherapy, constipation, CAD, CVA, cardiomegaly, HLD, HTN, anemia, NSTEMI, obesity, chronic back pain after back surgery, and a history of motion sickness  Balance Screen  Has the patient fallen in the past 6 months Yes  How many times? 5  Has the patient had a decrease in activity level because of a fear of falling?  Yes  [place] residence  Living Arrangements Spouse/significant other;Children  Type of [place] to enter  Entrance Stairs-Number of Steps 6-7  Entrance Stairs-Rails None  Home Layout One level  [place] - 4 wheels;Other (comment) (bed rail)  Additional Comments just recently started using rollator after surgery  Prior Function  Level of Independence Independent  Observation/Other Assessments  Focus on Therapeutic Outcomes (FOTO)  Reports 28% functional status; 72% limited.    Other Surveys  Dizziness Handicap Inventory [facility])  Dizziness Handicap Inventory [facility])  84% severe  Sensation  Light Touch Impaired by gross assessment  Additional Comments Reports nerves in hands and feet are hypersensitive; decreased to light touch in toes  Posture/Postural Control  Posture/Postural Control Postural limitations  Postural Limitations Rounded Shoulders;Forward head;Increased thoracic kyphosis;Flexed trunk  Posture Comments .  ROM / Strength  AROM / PROM / Strength Strength  Strength  Overall Strength Deficits  Overall Strength Comments 4-/5 hip flexion, 4/5 knee extension, 4-/5 knee flexion, 4/5 ankle DF  Ambulation/Gait  Ambulation/Gait Yes  Ambulation/Gait Assistance 4: Min assist;5:  Supervision  Ambulation/Gait Assistance Details supervision with rollator, min A without rollator  Ambulation Distance (Feet) 115 Feet  Assistive device 4-wheeled walker  Gait Pattern Step-through pattern;Decreased stride length;Trunk flexed;Narrow base of support  Ambulation Surface Level;Indoor  Stairs Yes  Stairs Assistance 4: Min guard  Stair Management Technique Two rails;Alternating pattern;Step to pattern;Forwards  Number of Stairs 4  Height of Stairs 6  Standardized Balance Assessment  Standardized Balance Assessment Berg Balance Test;Five Times Sit to Stand;10 meter walk test  Five times sit to stand comments  23 seconds hands on thighs, flexed trunk  10 Meter Walk 20 seconds with one UE support or 1.64 ft/sec  Berg Balance Test  Sit to Stand 3  Standing Unsupported 4  Sitting with Back Unsupported but Feet Supported on Floor or Stool 4  Stand to Sit 3  Transfers 4  Standing Unsupported with Eyes Closed 3  Standing Unsupported with Feet Together 3  From Standing, Reach Forward with Outstretched Arm 3  From Standing Position, Pick up Object from Floor 3  From Standing Position, Turn to Look Behind Over each Shoulder 4  Turn 360 Degrees 2  Standing Unsupported, Alternately Place Feet on Step/Stool 1  Standing Unsupported, One Foot in Front 0  Standing on One Leg 1  Total Score 38  Berg comment: 38/56              Objective measurements completed on examination: See above findings.              PT Education - 07/19/19 1645    Education Details  clinical findings, PT POC and goals; will limit visits due to financial concerns    Person(s) Educated  Patient    Methods  Explanation    Comprehension  Verbalized understanding       PT Short Term Goals - 07/19/19 1651      PT SHORT TERM GOAL #1   Title  Pt will participate in full vestibular assessment    Time  4    Period  Weeks    Status  New    Target Date  08/18/19      PT SHORT TERM GOAL  #2   Title  Pt will improve LE strength as indicated by decrease in time to perform five time sit to stand to </= 19 seconds with use of UE    Baseline  23 seconds with use of UE    Time  4    Period  Weeks    Status  New    Target Date  08/18/19      PT SHORT TERM GOAL #3   Title  Pt will improve gait velocity with LRAD to >/= 1.8 ft/sec with supervision    Baseline  1.64 ft/sec    Time  4    Period  Weeks    Status  New    Target Date  08/18/19      PT SHORT TERM GOAL #4   Title  Pt will increase BERG to >/= 42/56 to indicate decreased falls risk    Baseline  38/56    Time  4    Period  Weeks    Status  New    Target Date  08/18/19      PT SHORT TERM GOAL #5   Title  Pt will demonstrate independence with initial vestibular, balance and LE strengthening HEP    Time  4    Period  Weeks    Status  New    Target Date  08/18/19        PT Long Term Goals - 07/19/19 1654      PT LONG TERM GOAL #1   Title  Pt will be independent with final HEP for vestibular, balance and strengthening    Time  8    Period  Weeks    Status  New    Target Date  09/17/19      PT LONG TERM GOAL #2   Title  Will report 18 point decrease in [place]    Baseline  84 severe    Time  8    Period  Weeks    Status  New    Target Date  09/17/19      PT LONG TERM GOAL #3   Title  Pt will increase gait velocity with LRAD to >/= 2.0 ft/sec  Time  8    Period  Weeks    Status  New    Target Date  09/17/19      PT LONG TERM GOAL #4   Title  Pt will increase BERG to >/= 46/56 to indicate decreased risk for falls    Time  8    Period  Weeks    Status  New    Target Date  09/17/19      PT LONG TERM GOAL #5   Title  Pt will decrease time to perform five time sit to stand to </= 15 seconds    Time  8    Period  Weeks    Status  New    Target Date  09/17/19      Additional Long Term Goals   Additional Long Term Goals  Yes      PT LONG TERM GOAL #6   Title  Pt will report ability to  participate in MRADL - cooking, laundry, cleaning - with supervision of husband or daughter and will report improvement in function on FOTO to >/= 62%    Baseline  28% function    Time  8    Period  Weeks    Status  New    Target Date  09/17/19             Plan - 07/19/19 1647    Clinical Impression Statement  Pt is a 74 year old female referred to Neuro OPPT for evaluation of dizziness and falls.  Pt's PMH is significant for the following: cancer of R colon with colectomy and chemotherapy, constipation, CAD, CVA, cardiomegaly, HLD, HTN, anemia, NSTEMI, obesity, chronic back pain after back surgery, and a history of motion sickness. The following deficits were noted during pt's exam: significant deconditioning following colon cancer and treatment with chemotherapy, impaired LE sensation, impaired LE strength, impaired balance, impaired posture, impaired gait, decreased activity tolerance and dizziness limiting patient's ability to be able to participate in MRADLs.  Pt's five time sit to stand, gait velocity and BERG scores indicate pt is at significant risk for falls. Pt would benefit from skilled PT to address these impairments and functional limitations to maximize functional mobility independence and reduce falls risk.    Personal Factors and Comorbidities  Comorbidity 3+;Finances;Fitness;Transportation    Comorbidities  cancer of R colon with colectomy and chemotherapy, constipation, CAD, CVA, cardiomegaly, HLD, HTN, anemia, NSTEMI, obesity, chronic back pain after back surgery, and a history of motion sickness    Examination-Activity Limitations  Bend;Locomotion Level;Stairs;Stand    Examination-Participation Restrictions  Cleaning;Laundry;Meal Prep    Stability/Clinical Decision Making  Evolving/Moderate complexity    Clinical Decision Making  Moderate    Rehab Potential  Good    PT Frequency  1x / week    PT Duration  8 weeks    PT Treatment/Interventions  ADLs/Self Care Home  Management;Canalith Repostioning;Cryotherapy;Moist Heat;DME Instruction;Gait training;Stair training;Functional mobility training;Therapeutic activities;Therapeutic exercise;Balance training;Neuromuscular re-education;Patient/family education;Orthotic Fit/Training;Vestibular    PT Next Visit Plan  Perform thorough vestibular assessment; initiate thorough strengthening, balance and vestibular HEP - pt can only do once a week due to finances.    Consulted and Agree with Plan of Care  Patient       Patient will benefit from skilled therapeutic intervention in order to improve the following deficits and impairments:  Decreased activity tolerance, Decreased balance, Decreased endurance, Decreased strength, Difficulty walking, Dizziness, Impaired sensation, Postural dysfunction, Pain  Visit Diagnosis: Repeated falls  Other disturbances  of skin sensation  Muscle weakness (generalized)  Dizziness and giddiness  Unsteadiness on feet  Difficulty in walking, not elsewhere classified  Abnormal posture     Problem List Patient Active Problem List   Diagnosis Date Noted  . Generalized weakness 07/05/2019  . Diarrhea 07/05/2019  . Fall at home, initial encounter 07/05/2019  . Port-A-Cath in place 02/23/2019  . Family history of colon cancer   . Personal history of colonic polyps   . History of MI (myocardial infarction) 01/22/2019  . Severe obesity ([place])   . PONV (postoperative nausea and vomiting)   . Iron deficiency anemia   . Hypertension   . Hyperlipidemia   . History of cardiomegaly   . GERD (gastroesophageal reflux disease)   . Cancer of right colon - resected 01/17/2019 01/17/2019  . CAD S/P percutaneous coronary angioplasty 07/10/2016  . Essential hypertension 02/28/2016  . Dyslipidemia, goal LDL below 70 02/28/2016    Rico Junker, PT, DPT 07/19/19    5:01 PM    [facility] [address] [place],  [place], [address] Phone: [phone]   Fax:  [phone]  Name: [name] MRN: [id number] Date of Birth: [birth year]

## 2019-07-28 ENCOUNTER — Other Ambulatory Visit: Payer: Self-pay

## 2019-07-28 ENCOUNTER — Ambulatory Visit: Payer: Medicare Other | Admitting: Physical Therapy

## 2019-07-28 ENCOUNTER — Encounter: Payer: Self-pay | Admitting: Physical Therapy

## 2019-07-28 VITALS — BP 110/70 | HR 113

## 2019-07-28 DIAGNOSIS — R208 Other disturbances of skin sensation: Secondary | ICD-10-CM

## 2019-07-28 DIAGNOSIS — R42 Dizziness and giddiness: Secondary | ICD-10-CM

## 2019-07-28 DIAGNOSIS — R296 Repeated falls: Secondary | ICD-10-CM

## 2019-07-28 DIAGNOSIS — R262 Difficulty in walking, not elsewhere classified: Secondary | ICD-10-CM

## 2019-07-28 DIAGNOSIS — M6281 Muscle weakness (generalized): Secondary | ICD-10-CM

## 2019-07-28 DIAGNOSIS — R293 Abnormal posture: Secondary | ICD-10-CM

## 2019-07-28 DIAGNOSIS — R2681 Unsteadiness on feet: Secondary | ICD-10-CM

## 2019-07-28 NOTE — Therapy (Addendum)
[facility] [address] [place], [place], [address] Phone: [phone]   Fax:  [phone]  Physical Therapy Treatment  Patient Details  Name: [name] MRN: [id number] Date of Birth: 01/03/1946 Referring Provider (PT): Referred by [facility] physician; certification will be sent to PCP - Chesley Noon, MD   Encounter Date: 07/28/2019  PT End of Session - 07/28/19 1627    Visit Number  2    Number of Visits  17   updated with updated POC 2x/week   Date for PT Re-Evaluation  09/17/19    Authorization Type  [employer] $40 copay.  10th visit PN    PT Start Time  1530    PT Stop Time  1618    PT Time Calculation (min)  48 min    Activity Tolerance  Patient tolerated treatment well    Behavior During Therapy  WFL for tasks assessed/performed       Past Medical History:  Diagnosis Date  . Cancer of right colon ([place])   . Constipation   . Coronary artery disease   . CVA (cerebral infarction) 1996  . Dyspnea    going up stairs and hills  . Family history of colon cancer   . GERD (gastroesophageal reflux disease)   . History of cardiomegaly   . Hyperlipidemia   . Hypertension   . Iron deficiency anemia   . Myocardial infarction ([place]) 2017  . NSTEMI (non-ST elevated myocardial infarction) ([place])   . Personal history of colonic polyps   . PONV (postoperative nausea and vomiting)   . Right shoulder pain    more painful at night , cold makes it worse  . Severe obesity ([place])     Past Surgical History:  Procedure Laterality Date  . ABDOMINAL HYSTERECTOMY    . BACK SURGERY     x2  . CARDIAC CATHETERIZATION N/A 02/28/2016   Procedure: Left Heart Cath and Coronary Angiography;  Surgeon: Peter M Martinique, MD;  Location: [facility];  Service: Cardiovascular;  Laterality: N/A;  . CARDIAC CATHETERIZATION N/A 02/28/2016   Procedure: Coronary Stent Intervention;  Surgeon: Peter M Martinique, MD;  Location: [facility];  Service: Cardiovascular;  Laterality: N/A;  . CHOLECYSTECTOMY    . COLONOSCOPY  11/24/2018  . CORONARY ANGIOPLASTY    . CYST EXCISION Left    Cheek  . LAPAROSCOPIC RIGHT HEMI COLECTOMY Right 01/17/2019   Procedure: LAPAROSCOPIC RIGHT HEMI COLECTOMY;  Surgeon: Alphonsa Overall, MD;  [facility];  Service: General;  Laterality: Right;  . NASAL FRACTURE SURGERY    . PORTACATH PLACEMENT N/A 02/22/2019   Procedure: POWER PORT PLACEMENT;  Surgeon: Alphonsa Overall, MD;  Location: [facility];  Service: General;  Laterality: N/A;  . UPPER GI ENDOSCOPY  11/24/2018    Vitals:   07/28/19 1539  BP: 110/70  Pulse: (!) 113  SpO2: 92%    Subjective Assessment - 07/28/19 1535    Subjective  Pt feels like her walking has gotten worse over the past few days - leaning forwards and to the L more, shuffling more, lack of energy.  No changes in sleep, no changes in medication, no fevers, appetite is the same, no changes with bowel or bladder.  Dizziness is not worse.    Pertinent History  cancer of R colon with colectomy and chemotherapy, constipation, CAD, CVA, cardiomegaly, HLD, HTN, anemia, NSTEMI, obesity, chronic back pain after back surgery, and a history of motion sickness  Limitations  Standing;Walking;House hold activities    Patient Stated Goals  To get back on her feet and walk without feeling like she is going to fall - started last June after finding out she had colon CA - 18 inches removed + chemo.    Currently in Pain?  No/denies             Vestibular Assessment - 07/28/19 1544      Symptom Behavior   Type of Dizziness   Imbalance    Frequency of Dizziness  daily when standing without UE support    Symptom Nature  Positional    Aggravating Factors  Forward bending;Comment   fast moving objects in vision, standing without UE support   Relieving Factors  Comments   holding to support   Progression of Symptoms  Worse      Oculomotor Exam   Oculomotor Alignment   Abnormal   reports premorbid R eye weakness   Spontaneous  Absent    Gaze-induced   Absent    Smooth Pursuits  Saccades    Saccades  Poor trajectory;Slow    Comment  + 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 but pt reports that is premorbid      Oculomotor Exam-Fixation Suppressed    Left Head Impulse  positive but pt guarding neck movement    Right Head Impulse  difficult to assess; pt guarding neck movement      Vestibulo-Ocular Reflex   VOR to Slow Head Movement  Positive bilaterally    VOR Cancellation  Normal      Positional Testing   Sidelying Test  Sidelying Right;Sidelying Left      Sidelying Right   Sidelying Right Duration  0    Sidelying Right Symptoms  No nystagmus      Sidelying Left   Sidelying Left Duration  0    Sidelying Left Symptoms  No nystagmus      Positional Sensitivities   Nose to Right Knee  No dizziness    Right Knee to Sitting  No dizziness    Nose to Left Knee  No dizziness    Left Knee to Sitting  No dizziness    Head Turning x 5  No dizziness    Head Nodding x 5  No dizziness    Pivot Right in Standing  No dizziness    Pivot Left in Standing  No dizziness    Rolling Right  No dizziness    Rolling Left  No dizziness         Access Code: [email or web address] URL: [email or web address] Date: 07/28/2019 Prepared by: Misty Stanley  Exercises Sit to Stand with Hands on Knees - 5 reps - 2 sets - 1x daily - 7x weekly Seated Ankle Dorsiflexion with Resistance - 10 reps - 2 sets - 1x daily - 7x weekly     PT Education - 07/28/19 1626    Education Details  vestibular assessment findings, initial HEP    Person(s) Educated  Patient    Methods  Explanation;Demonstration;Handout    Comprehension  Verbalized understanding;Returned demonstration       PT Short Term Goals - 07/28/19 1637      PT SHORT TERM GOAL #1   Title  Pt will participate in full vestibular assessment    Baseline  no significant vestibular findings noted    Time  4    Period   Weeks    Status  Achieved    Target Date  08/18/19  PT SHORT TERM GOAL #2   Title  Pt will improve LE strength as indicated by decrease in time to perform five time sit to stand to </= 19 seconds with use of UE    Baseline  23 seconds with use of UE    Time  4    Period  Weeks    Status  New    Target Date  08/18/19      PT SHORT TERM GOAL #3   Title  Pt will improve gait velocity with LRAD to >/= 1.8 ft/sec with supervision    Baseline  1.64 ft/sec    Time  4    Period  Weeks    Status  New    Target Date  08/18/19      PT SHORT TERM GOAL #4   Title  Pt will increase BERG to >/= 42/56 to indicate decreased falls risk    Baseline  38/56    Time  4    Period  Weeks    Status  New    Target Date  08/18/19      PT SHORT TERM GOAL #5   Title  Pt will demonstrate independence with initial vestibular, balance and LE strengthening HEP    Time  4    Period  Weeks    Status  New    Target Date  08/18/19        PT Long Term Goals - 07/19/19 1654      PT LONG TERM GOAL #1   Title  Pt will be independent with final HEP for vestibular, balance and strengthening    Time  8    Period  Weeks    Status  New    Target Date  09/17/19      PT LONG TERM GOAL #2   Title  Will report 18 point decrease in [place]    Baseline  84 severe    Time  8    Period  Weeks    Status  New    Target Date  09/17/19      PT LONG TERM GOAL #3   Title  Pt will increase gait velocity with LRAD to >/= 2.0 ft/sec    Time  8    Period  Weeks    Status  New    Target Date  09/17/19      PT LONG TERM GOAL #4   Title  Pt will increase BERG to >/= 46/56 to indicate decreased risk for falls    Time  8    Period  Weeks    Status  New    Target Date  09/17/19      PT LONG TERM GOAL #5   Title  Pt will decrease time to perform five time sit to stand to </= 15 seconds    Time  8    Period  Weeks    Status  New    Target Date  09/17/19      Additional Long Term Goals   Additional Long Term  Goals  Yes      PT LONG TERM GOAL #6   Title  Pt will report ability to participate in MRADL - cooking, laundry, cleaning - with supervision of husband or daughter and will report improvement in function on FOTO to >/= 62%    Baseline  28% function    Time  8    Period  Weeks    Status  New  Target Date  09/17/19            Plan - 07/28/19 1628    Clinical Impression Statement  Pt feeling more off balance today - no other functional changes and no significant medical changes except BP slightly low today with elevated HR.  Pt tolerated vestibular assessment and initiation of LE strengthening HEP.  No significant vestibular findings noted - pt was negative for positional vertigo and did not demonstrate any motion sensitivity; difficult to assess VOR due to neck muscle guarding.  HEP focused on hip strengthening and ankle strengthening.  Will continue to add to HEP next session.  Husband requested pt POC be changed to 2x/week due to pt's level of deconditioning.  POC updated.    Personal Factors and Comorbidities  Comorbidity 3+;Finances;Fitness;Transportation    Comorbidities  cancer of R colon with colectomy and chemotherapy, constipation, CAD, CVA, cardiomegaly, HLD, HTN, anemia, NSTEMI, obesity, chronic back pain after back surgery, and a history of motion sickness    Examination-Activity Limitations  Bend;Locomotion Level;Stairs;Stand    Examination-Participation Restrictions  Cleaning;Laundry;Meal Prep    Stability/Clinical Decision Making  Evolving/Moderate complexity    Rehab Potential  Good    PT Frequency  2x / week    PT Duration  8 weeks    PT Treatment/Interventions  ADLs/Self Care Home Management;Canalith Repostioning;Cryotherapy;Moist Heat;DME Instruction;Gait training;Stair training;Functional mobility training;Therapeutic activities;Therapeutic exercise;Balance training;Neuromuscular re-education;Patient/family education;Orthotic Fit/Training;Vestibular    PT Next Visit  Plan  Continue to add exercises to strengthening and balance HEP.  Focusing on hip extension, ankle PF and DF strength, SLS, balance with decreased UE support.    Consulted and Agree with Plan of Care  Patient       Patient will benefit from skilled therapeutic intervention in order to improve the following deficits and impairments:  Decreased activity tolerance, Decreased balance, Decreased endurance, Decreased strength, Difficulty walking, Dizziness, Impaired sensation, Postural dysfunction, Pain  Visit Diagnosis: Repeated falls - Plan: PT plan of care cert/re-cert  Other disturbances of skin sensation - Plan: PT plan of care cert/re-cert  Muscle weakness (generalized) - Plan: PT plan of care cert/re-cert  Dizziness and giddiness - Plan: PT plan of care cert/re-cert  Unsteadiness on feet - Plan: PT plan of care cert/re-cert  Difficulty in walking, not elsewhere classified - Plan: PT plan of care cert/re-cert  Abnormal posture - Plan: PT plan of care cert/re-cert     Problem List Patient Active Problem List   Diagnosis Date Noted  . Generalized weakness 07/05/2019  . Diarrhea 07/05/2019  . Fall at home, initial encounter 07/05/2019  . Port-A-Cath in place 02/23/2019  . Family history of colon cancer   . Personal history of colonic polyps   . History of MI (myocardial infarction) 01/22/2019  . Severe obesity ([place])   . PONV (postoperative nausea and vomiting)   . Iron deficiency anemia   . Hypertension   . Hyperlipidemia   . History of cardiomegaly   . GERD (gastroesophageal reflux disease)   . Cancer of right colon - resected 01/17/2019 01/17/2019  . CAD S/P percutaneous coronary angioplasty 07/10/2016  . Essential hypertension 02/28/2016  . Dyslipidemia, goal LDL below 70 02/28/2016   Rico Junker, PT, DPT 07/28/19    4:38 PM    [facility] [address] [place], [place], [address] Phone: [phone]    Fax:  [phone]  Name: [name] MRN: [id number] Date of Birth: [birth year]

## 2019-07-28 NOTE — Patient Instructions (Signed)
Access Code: [email or web address] URL: [email or web address] Date: 07/28/2019 Prepared by: Misty Stanley  Exercises Sit to Stand with Hands on Knees - 5 reps - 2 sets - 1x daily - 7x weekly Seated Ankle Dorsiflexion with Resistance - 10 reps - 2 sets - 1x daily - 7x weekly

## 2019-08-04 ENCOUNTER — Other Ambulatory Visit: Payer: Self-pay

## 2019-08-04 ENCOUNTER — Ambulatory Visit: Payer: Medicare Other

## 2019-08-04 DIAGNOSIS — M6281 Muscle weakness (generalized): Secondary | ICD-10-CM

## 2019-08-04 DIAGNOSIS — R296 Repeated falls: Secondary | ICD-10-CM | POA: Diagnosis not present

## 2019-08-04 DIAGNOSIS — R2681 Unsteadiness on feet: Secondary | ICD-10-CM

## 2019-08-04 NOTE — Therapy (Addendum)
[facility] [address] [place], [place], [address] Phone: [phone]   Fax:  [phone]  Physical Therapy Treatment  Patient Details  Name: [name] MRN: [id number] Date of Birth: Apr 15, 1946 Referring Provider (PT): Referred by [facility] physician; certification will be sent to PCP - Chesley Noon, MD   Encounter Date: 08/04/2019  PT End of Session - 08/04/19 1249    Visit Number  3    Number of Visits  17    Date for PT Re-Evaluation  09/17/19    Authorization Type  [employer] $40 copay.  10th visit PN    PT Start Time  1154   pt late   PT Stop Time  1233    PT Time Calculation (min)  39 min    Equipment Utilized During Treatment  Other (comment)   S to ensure safety   Activity Tolerance  Patient tolerated treatment well    Behavior During Therapy  WFL for tasks assessed/performed       Past Medical History:  Diagnosis Date  . Cancer of right colon ([place])   . Constipation   . Coronary artery disease   . CVA (cerebral infarction) 1996  . Dyspnea    going up stairs and hills  . Family history of colon cancer   . GERD (gastroesophageal reflux disease)   . History of cardiomegaly   . Hyperlipidemia   . Hypertension   . Iron deficiency anemia   . Myocardial infarction ([place]) 2017  . NSTEMI (non-ST elevated myocardial infarction) ([place])   . Personal history of colonic polyps   . PONV (postoperative nausea and vomiting)   . Right shoulder pain    more painful at night , cold makes it worse  . Severe obesity ([place])     Past Surgical History:  Procedure Laterality Date  . ABDOMINAL HYSTERECTOMY    . BACK SURGERY     x2  . CARDIAC CATHETERIZATION N/A 02/28/2016   Procedure: Left Heart Cath and Coronary Angiography;  Surgeon: Peter M Martinique, MD;  Location: [facility];  Service: Cardiovascular;  Laterality: N/A;  . CARDIAC CATHETERIZATION N/A 02/28/2016   Procedure: Coronary Stent  Intervention;  Surgeon: Peter M Martinique, MD;  Location: [facility];  Service: Cardiovascular;  Laterality: N/A;  . CHOLECYSTECTOMY    . COLONOSCOPY  11/24/2018  . CORONARY ANGIOPLASTY    . CYST EXCISION Left    Cheek  . LAPAROSCOPIC RIGHT HEMI COLECTOMY Right 01/17/2019   Procedure: LAPAROSCOPIC RIGHT HEMI COLECTOMY;  Surgeon: Alphonsa Overall, MD;  [facility];  Service: General;  Laterality: Right;  . NASAL FRACTURE SURGERY    . PORTACATH PLACEMENT N/A 02/22/2019   Procedure: POWER PORT PLACEMENT;  Surgeon: Alphonsa Overall, MD;  Location: [facility];  Service: General;  Laterality: N/A;  . UPPER GI ENDOSCOPY  11/24/2018    There were no vitals filed for this visit.  Subjective Assessment - 08/04/19 1157    Subjective  Pt denied falls since last visit. Pt reported severe LBP after performing ankle DF HEP. Pt reporting not using rollator at all times while amb. at home and having to grab wall to maintain balance.    Pertinent History  cancer of R colon with colectomy and chemotherapy, constipation, CAD, CVA, cardiomegaly, HLD, HTN, anemia, NSTEMI, obesity, chronic back pain after back surgery, and a history of motion sickness    Patient Stated Goals  To get back on her feet and walk  without feeling like she is going to fall - started last June after finding out she had colon CA - 18 inches removed + chemo.    Currently in Pain?  No/denies        Therex: Access Code: [email or web address]  URL: [email or web address]  Date: 08/04/2019  Prepared by: Geoffry Paradise   Exercises Sit to Stand with Hands on Knees - 5 reps - 2 sets - 1x daily - 7x weekly Seated Ankle Dorsiflexion with Resistance - 10 reps - 2 sets - 1x daily - 3x weekly with yellow theraband tied around balls of feet Heel Toe Raises with Counter Support - 10 reps - 2 sets - 1x daily - 3x weekly Standing Hip Extension - 10 reps - 2 sets - 1x daily - 3x weekly Standing Single Leg Stance with Unilateral Counter Support - 3  reps - 1 sets - 10 hold - 1x daily - 5x weekly  All HEP performed with S to ensure safety. PT had pt review and perform current HEP and modified as indicated (no long arc quad prior to DF and ankle eversion) as pt reported severe LBP after performing this activity at home. Pt required cues and demo for sequencing and proper technique during STS txfs. Cues and demo required for proper technique during all exercises. Pt required seated and standing rest breaks during session. Cues for upright posture during standing activities.                        PT Education - 08/04/19 1248    Education Details  PT discussed that improvements in strength, endurance, and balance take time as pt expressed frustation in not feeling better within two visits. PT reviewed, modified and added to pt's HEP. PT educated pt on the importance of using rollator at all times for safety and to decr. falls risk-even when at home as pt not using rollator at all times and reported she had to grab wall for support to maintain balance.    Person(s) Educated  Patient    Methods  Explanation;Demonstration;Tactile cues;Verbal cues;Handout    Comprehension  Returned demonstration;Verbalized understanding;Need further instruction       PT Short Term Goals - 07/28/19 1637      PT SHORT TERM GOAL #1   Title  Pt will participate in full vestibular assessment    Baseline  no significant vestibular findings noted    Time  4    Period  Weeks    Status  Achieved    Target Date  08/18/19      PT SHORT TERM GOAL #2   Title  Pt will improve LE strength as indicated by decrease in time to perform five time sit to stand to </= 19 seconds with use of UE    Baseline  23 seconds with use of UE    Time  4    Period  Weeks    Status  New    Target Date  08/18/19      PT SHORT TERM GOAL #3   Title  Pt will improve gait velocity with LRAD to >/= 1.8 ft/sec with supervision    Baseline  1.64 ft/sec    Time  4    Period   Weeks    Status  New    Target Date  08/18/19      PT SHORT TERM GOAL #4   Title  Pt will increase BERG to >/= 42/56 to indicate decreased  falls risk    Baseline  38/56    Time  4    Period  Weeks    Status  New    Target Date  08/18/19      PT SHORT TERM GOAL #5   Title  Pt will demonstrate independence with initial vestibular, balance and LE strengthening HEP    Time  4    Period  Weeks    Status  New    Target Date  08/18/19        PT Long Term Goals - 07/19/19 1654      PT LONG TERM GOAL #1   Title  Pt will be independent with final HEP for vestibular, balance and strengthening    Time  8    Period  Weeks    Status  New    Target Date  09/17/19      PT LONG TERM GOAL #2   Title  Will report 18 point decrease in [facility]    Baseline  84 severe    Time  8    Period  Weeks    Status  New    Target Date  09/17/19      PT LONG TERM GOAL #3   Title  Pt will increase gait velocity with LRAD to >/= 2.0 ft/sec    Time  8    Period  Weeks    Status  New    Target Date  09/17/19      PT LONG TERM GOAL #4   Title  Pt will increase BERG to >/= 46/56 to indicate decreased risk for falls    Time  8    Period  Weeks    Status  New    Target Date  09/17/19      PT LONG TERM GOAL #5   Title  Pt will decrease time to perform five time sit to stand to </= 15 seconds    Time  8    Period  Weeks    Status  New    Target Date  09/17/19      Additional Long Term Goals   Additional Long Term Goals  Yes      PT LONG TERM GOAL #6   Title  Pt will report ability to participate in MRADL - cooking, laundry, cleaning - with supervision of husband or daughter and will report improvement in function on FOTO to >/= 62%    Baseline  28% function    Time  8    Period  Weeks    Status  New    Target Date  09/17/19            Plan - 08/04/19 1159    Clinical Impression Statement  Today's skilled session focused on modifying and adding to pt's HEP in order to improve  strengthening and balance. Pt continues to require cues and demo for sequencing during STS txfs with rollator. Pt required several seated and standing rest breaks during session 2/2 decr. endurance. Pt would continue to benefit from skilled PT to improve safety during functional mobility.    Personal Factors and Comorbidities  Comorbidity 3+;Finances;Fitness;Transportation    Comorbidities  cancer of R colon with colectomy and chemotherapy, constipation, CAD, CVA, cardiomegaly, HLD, HTN, anemia, NSTEMI, obesity, chronic back pain after back surgery, and a history of motion sickness    Examination-Activity Limitations  Bend;Locomotion Level;Stairs;Stand    Examination-Participation Restrictions  Cleaning;Laundry;Meal Prep    Stability/Clinical Decision Making  Evolving/Moderate complexity    Rehab Potential  Good    PT Frequency  2x / week    PT Duration  8 weeks    PT Treatment/Interventions  ADLs/Self Care Home Management;Canalith Repostioning;Cryotherapy;Moist Heat;DME Instruction;Gait training;Stair training;Functional mobility training;Therapeutic activities;Therapeutic exercise;Balance training;Neuromuscular re-education;Patient/family education;Orthotic Fit/Training;Vestibular    PT Next Visit Plan  Review HEP PRN and progress as indicated.  Trial dynamic gait activities with counter support and review STS txfs sequencing with rollator for safety.    PT Home Exercise Plan  [email or web address]    Consulted and Agree with Plan of Care  Patient       Patient will benefit from skilled therapeutic intervention in order to improve the following deficits and impairments:  Decreased activity tolerance, Decreased balance, Decreased endurance, Decreased strength, Difficulty walking, Dizziness, Impaired sensation, Postural dysfunction, Pain  Visit Diagnosis: Muscle weakness (generalized)  Unsteadiness on feet  Repeated falls     Problem List Patient Active Problem List   Diagnosis Date Noted  .  Generalized weakness 07/05/2019  . Diarrhea 07/05/2019  . Fall at home, initial encounter 07/05/2019  . Port-A-Cath in place 02/23/2019  . Family history of colon cancer   . Personal history of colonic polyps   . History of MI (myocardial infarction) 01/22/2019  . Severe obesity ([place])   . PONV (postoperative nausea and vomiting)   . Iron deficiency anemia   . Hypertension   . Hyperlipidemia   . History of cardiomegaly   . GERD (gastroesophageal reflux disease)   . Cancer of right colon - resected 01/17/2019 01/17/2019  . CAD S/P percutaneous coronary angioplasty 07/10/2016  . Essential hypertension 02/28/2016  . Dyslipidemia, goal LDL below 70 02/28/2016    Kerem Gilmer L 08/04/2019, 1:01 PM   Geoffry Paradise, PT,DPT 08/04/19 1:01 PM Phone: [phone] Fax: [facility] [address] [place], [place], [address] Phone: [phone]   Fax:  [phone]  Name: [name] MRN: [id number] Date of Birth: Apr 29, 1946

## 2019-08-04 NOTE — Patient Instructions (Addendum)
Access Code: [email or web address]  URL: [email or web address]  Date: 08/04/2019  Prepared by: Geoffry Paradise   Exercises Sit to Stand with Hands on Knees - 5 reps - 2 sets - 1x daily - 7x weekly Seated Ankle Dorsiflexion with Resistance - 10 reps - 2 sets - 1x daily - 3x weekly Heel Toe Raises with Counter Support - 10 reps - 2 sets - 1x daily - 3x weekly Standing Hip Extension - 10 reps - 2 sets - 1x daily - 3x weekly Standing Single Leg Stance with Unilateral Counter Support - 3 reps - 1 sets - 10 hold - 1x daily - 5x weekly

## 2019-08-10 ENCOUNTER — Other Ambulatory Visit: Payer: Self-pay

## 2019-08-10 ENCOUNTER — Encounter: Payer: Self-pay | Admitting: Physical Therapy

## 2019-08-10 ENCOUNTER — Ambulatory Visit: Payer: Medicare Other | Attending: Family Medicine | Admitting: Physical Therapy

## 2019-08-10 VITALS — BP 119/82 | HR 112

## 2019-08-10 DIAGNOSIS — R293 Abnormal posture: Secondary | ICD-10-CM | POA: Insufficient documentation

## 2019-08-10 DIAGNOSIS — R209 Unspecified disturbances of skin sensation: Secondary | ICD-10-CM | POA: Insufficient documentation

## 2019-08-10 DIAGNOSIS — R2681 Unsteadiness on feet: Secondary | ICD-10-CM | POA: Diagnosis present

## 2019-08-10 DIAGNOSIS — R262 Difficulty in walking, not elsewhere classified: Secondary | ICD-10-CM | POA: Insufficient documentation

## 2019-08-10 DIAGNOSIS — M6281 Muscle weakness (generalized): Secondary | ICD-10-CM | POA: Diagnosis not present

## 2019-08-10 DIAGNOSIS — R296 Repeated falls: Secondary | ICD-10-CM | POA: Insufficient documentation

## 2019-08-10 DIAGNOSIS — R42 Dizziness and giddiness: Secondary | ICD-10-CM | POA: Insufficient documentation

## 2019-08-10 DIAGNOSIS — R208 Other disturbances of skin sensation: Secondary | ICD-10-CM

## 2019-08-10 NOTE — Therapy (Signed)
[facility] [address] [place], [place], [address] Phone: [phone]   Fax:  [phone]  Physical Therapy Treatment  Patient Details  Name: [name] MRN: [id number] Date of Birth: 08-22-1945 Referring Provider (PT): Referred by [facility] physician; certification will be sent to PCP - Chesley Noon, MD   Encounter Date: 08/10/2019  PT End of Session - 08/10/19 1514    Visit Number  4    Number of Visits  17    Date for PT Re-Evaluation  09/17/19    Authorization Type  [employer] $40 copay.  10th visit PN    PT Start Time  0201    PT Stop Time  0246    PT Time Calculation (min)  45 min    Activity Tolerance  Patient limited by fatigue;Patient limited by pain    Behavior During Therapy  [facility] for tasks assessed/performed       Past Medical History:  Diagnosis Date  . Cancer of right colon ([place])   . Constipation   . Coronary artery disease   . CVA (cerebral infarction) 1996  . Dyspnea    going up stairs and hills  . Family history of colon cancer   . GERD (gastroesophageal reflux disease)   . History of cardiomegaly   . Hyperlipidemia   . Hypertension   . Iron deficiency anemia   . Myocardial infarction ([place]) 2017  . NSTEMI (non-ST elevated myocardial infarction) ([place])   . Personal history of colonic polyps   . PONV (postoperative nausea and vomiting)   . Right shoulder pain    more painful at night , cold makes it worse  . Severe obesity ([place])     Past Surgical History:  Procedure Laterality Date  . ABDOMINAL HYSTERECTOMY    . BACK SURGERY     x2  . CARDIAC CATHETERIZATION N/A 02/28/2016   Procedure: Left Heart Cath and Coronary Angiography;  Surgeon: Peter M Martinique, MD;  Location: [facility];  Service: Cardiovascular;  Laterality: N/A;  . CARDIAC CATHETERIZATION N/A 02/28/2016   Procedure: Coronary Stent Intervention;  Surgeon: Peter M Martinique, MD;  Location: [facility];   Service: Cardiovascular;  Laterality: N/A;  . CHOLECYSTECTOMY    . COLONOSCOPY  11/24/2018  . CORONARY ANGIOPLASTY    . CYST EXCISION Left    Cheek  . LAPAROSCOPIC RIGHT HEMI COLECTOMY Right 01/17/2019   Procedure: LAPAROSCOPIC RIGHT HEMI COLECTOMY;  Surgeon: Alphonsa Overall, MD;  Location: [facility];  Service: General;  Laterality: Right;  . NASAL FRACTURE SURGERY    . PORTACATH PLACEMENT N/A 02/22/2019   Procedure: POWER PORT PLACEMENT;  Surgeon: Alphonsa Overall, MD;  Location: [facility];  Service: General;  Laterality: N/A;  . UPPER GI ENDOSCOPY  11/24/2018    Vitals:   08/10/19 1412  BP: 119/82  Pulse: (!) 112    Subjective Assessment - 08/10/19 1405    Subjective  Didn't sleep well last night; Not feeling too good today; feeling fatigued.  Notices more shuffling with gait.  DF exercise without resistance did not bother her back.    Pertinent History  cancer of R colon with colectomy and chemotherapy, constipation, CAD, CVA, cardiomegaly, HLD, HTN, anemia, NSTEMI, obesity, chronic back pain after back surgery, and a history of motion sickness    Patient Stated Goals  To get back on her feet and walk without feeling like she is going to fall - started last June after finding out she  had colon CA - 18 inches removed + chemo.    Currently in Pain?  No/denies        Attempted to perform standing exercises from previous HEP revision but pt only able to perform 3-5 reps of 1-2 exercises in standing before she became too fatigued.  Educated pt that the HEP would not be as beneficial for building strength, endurance, balance if she isn't able to perform it at home to reinforce what she is working on in therapy.  Again, continued to reinforce that consistent performance will lead to progress and improvement in strength and mobility.  Transitioned to supine and performed the following exercises:  Access Code: [email or web address] URL: [email or web address] Date: 08/10/2019 Prepared by: Misty Stanley  Exercises Sit to Stand with Hands on Knees - 10 reps - 1 sets - 1x daily - 7x weekly Heel Toe Raises with Counter Support - 8 reps - 1 sets - 1x daily - 7x weekly Standing Single Leg Stance with Counter Support - 2 sets - 5 seconds hold - 1x daily - 7x weekly Supine Scapular Retraction - 10 reps - 1 sets - 1x daily - 7x weekly Supine Quad Set - 8 reps - 2 sets - 4-5 seconds each press hold - 1x daily - 7x weekly Supine Straight Leg Raises - 5 reps - 2 sets - 1x daily - 7x weekly Supine Hip Adduction Isometric with Ball - 10 reps - 3 sets - 1x daily - 7x weekly  Unable to perform bridges in supine due to pain at surgical site.      PT Education - 08/10/19 1513    Education Details  revised HEP completely - decreased amount of standing exercises for now    Person(s) Educated  Patient    Methods  Explanation;Demonstration;Handout    Comprehension  Verbalized understanding;Returned demonstration       PT Short Term Goals - 07/28/19 1637      PT SHORT TERM GOAL #1   Title  Pt will participate in full vestibular assessment    Baseline  no significant vestibular findings noted    Time  4    Period  Weeks    Status  Achieved    Target Date  08/18/19      PT SHORT TERM GOAL #2   Title  Pt will improve LE strength as indicated by decrease in time to perform five time sit to stand to </= 19 seconds with use of UE    Baseline  23 seconds with use of UE    Time  4    Period  Weeks    Status  New    Target Date  08/18/19      PT SHORT TERM GOAL #3   Title  Pt will improve gait velocity with LRAD to >/= 1.8 ft/sec with supervision    Baseline  1.64 ft/sec    Time  4    Period  Weeks    Status  New    Target Date  08/18/19      PT SHORT TERM GOAL #4   Title  Pt will increase BERG to >/= 42/56 to indicate decreased falls risk    Baseline  38/56    Time  4    Period  Weeks    Status  New    Target Date  08/18/19      PT SHORT TERM GOAL #5   Title  Pt will demonstrate  independence with initial vestibular, balance and  LE strengthening HEP    Time  4    Period  Weeks    Status  New    Target Date  08/18/19        PT Long Term Goals - 07/19/19 1654      PT LONG TERM GOAL #1   Title  Pt will be independent with final HEP for vestibular, balance and strengthening    Time  8    Period  Weeks    Status  New    Target Date  09/17/19      PT LONG TERM GOAL #2   Title  Will report 18 point decrease in [facility]    Baseline  84 severe    Time  8    Period  Weeks    Status  New    Target Date  09/17/19      PT LONG TERM GOAL #3   Title  Pt will increase gait velocity with LRAD to >/= 2.0 ft/sec    Time  8    Period  Weeks    Status  New    Target Date  09/17/19      PT LONG TERM GOAL #4   Title  Pt will increase BERG to >/= 46/56 to indicate decreased risk for falls    Time  8    Period  Weeks    Status  New    Target Date  09/17/19      PT LONG TERM GOAL #5   Title  Pt will decrease time to perform five time sit to stand to </= 15 seconds    Time  8    Period  Weeks    Status  New    Target Date  09/17/19      Additional Long Term Goals   Additional Long Term Goals  Yes      PT LONG TERM GOAL #6   Title  Pt will report ability to participate in MRADL - cooking, laundry, cleaning - with supervision of husband or daughter and will report improvement in function on FOTO to >/= 62%    Baseline  28% function    Time  8    Period  Weeks    Status  New    Target Date  09/17/19            Plan - 08/10/19 1515    Clinical Impression Statement  Continued to modify HEP due to pt reporting inability to tolerate and perform full HEP in standing due to fatigue. Kept sit <> stand, SLS and heel-toe raises but remainder of exercises performed in supine to allow for stabilization, anterior shoulder and hip stretch and improved tolerance to exercises in order to perform full program.  Will continue to progress towards standing as pt is able to  tolerate.  By end of session pt demonstrated improved upright posture and decreased shuffling of gait with rollator.    Personal Factors and Comorbidities  Comorbidity 3+;Finances;Fitness;Transportation    Comorbidities  cancer of R colon with colectomy and chemotherapy, constipation, CAD, CVA, cardiomegaly, HLD, HTN, anemia, NSTEMI, obesity, chronic back pain after back surgery, and a history of motion sickness    Examination-Activity Limitations  Bend;Locomotion Level;Stairs;Stand    Examination-Participation Restrictions  Cleaning;Laundry;Meal Prep    Stability/Clinical Decision Making  Evolving/Moderate complexity    Rehab Potential  Good    PT Frequency  2x / week    PT Duration  8 weeks    PT  Treatment/Interventions  ADLs/Self Care Home Management;Canalith Repostioning;Cryotherapy;Moist Heat;DME Instruction;Gait training;Stair training;Functional mobility training;Therapeutic activities;Therapeutic exercise;Balance training;Neuromuscular re-education;Patient/family education;Orthotic Fit/Training;Vestibular    PT Next Visit Plan  STG due by 3/12.  How are supine exercises going?  Better tolerating?  Endurance on Nustep.  Postural exercises, balance with decreased UE support, weight shifting and SLS activities    Consulted and Agree with Plan of Care  Patient       Patient will benefit from skilled therapeutic intervention in order to improve the following deficits and impairments:  Decreased activity tolerance, Decreased balance, Decreased endurance, Decreased strength, Difficulty walking, Dizziness, Impaired sensation, Postural dysfunction, Pain  Visit Diagnosis: Muscle weakness (generalized)  Unsteadiness on feet  Repeated falls  Other disturbances of skin sensation  Dizziness and giddiness  Difficulty in walking, not elsewhere classified  Abnormal posture     Problem List Patient Active Problem List   Diagnosis Date Noted  . Generalized weakness 07/05/2019  . Diarrhea  07/05/2019  . Fall at home, initial encounter 07/05/2019  . Port-A-Cath in place 02/23/2019  . Family history of colon cancer   . Personal history of colonic polyps   . History of MI (myocardial infarction) 01/22/2019  . Severe obesity ([place])   . PONV (postoperative nausea and vomiting)   . Iron deficiency anemia   . Hypertension   . Hyperlipidemia   . History of cardiomegaly   . GERD (gastroesophageal reflux disease)   . Cancer of right colon - resected 01/17/2019 01/17/2019  . CAD S/P percutaneous coronary angioplasty 07/10/2016  . Essential hypertension 02/28/2016  . Dyslipidemia, goal LDL below 70 02/28/2016    Rico Junker, PT, DPT 08/10/19    3:22 PM    [facility] [address] [place], [place], [address] Phone: [phone]   Fax:  [phone]  Name: [name] MRN: [id number] Date of Birth: [birth year]

## 2019-08-10 NOTE — Patient Instructions (Addendum)
Access Code: [email or web address] - deleted this exercise program - unable to perform multiple standing exercises at this time.  Replaced with the following HEP:   Access Code: [email or web address] URL: [email or web address] Date: 08/10/2019 Prepared by: Misty Stanley  Exercises Sit to Stand with Hands on Knees - 10 reps - 1 sets - 1x daily - 7x weekly Heel Toe Raises with Counter Support - 8 reps - 1 sets - 1x daily - 7x weekly Standing Single Leg Stance with Counter Support - 2 sets - 5 seconds hold - 1x daily - 7x weekly Supine Scapular Retraction - 10 reps - 1 sets - 1x daily - 7x weekly Supine Quad Set - 8 reps - 2 sets - 4-5 seconds each press hold - 1x daily - 7x weekly Supine Straight Leg Raises - 5 reps - 2 sets - 1x daily - 7x weekly Supine Hip Adduction Isometric with Ball - 10 reps - 3 sets - 1x daily - 7x weekly

## 2019-08-16 ENCOUNTER — Other Ambulatory Visit: Payer: Self-pay

## 2019-08-16 ENCOUNTER — Ambulatory Visit: Payer: Medicare Other | Admitting: Physical Therapy

## 2019-08-16 ENCOUNTER — Encounter: Payer: Self-pay | Admitting: Physical Therapy

## 2019-08-16 DIAGNOSIS — M6281 Muscle weakness (generalized): Secondary | ICD-10-CM

## 2019-08-16 DIAGNOSIS — R293 Abnormal posture: Secondary | ICD-10-CM

## 2019-08-16 DIAGNOSIS — R2681 Unsteadiness on feet: Secondary | ICD-10-CM

## 2019-08-16 DIAGNOSIS — R208 Other disturbances of skin sensation: Secondary | ICD-10-CM

## 2019-08-16 DIAGNOSIS — R296 Repeated falls: Secondary | ICD-10-CM

## 2019-08-16 DIAGNOSIS — R42 Dizziness and giddiness: Secondary | ICD-10-CM

## 2019-08-16 DIAGNOSIS — R262 Difficulty in walking, not elsewhere classified: Secondary | ICD-10-CM

## 2019-08-16 NOTE — Patient Instructions (Signed)
Access Code: [email or web address] - deleted this exercise program - unable to perform multiple standing exercises at this time.  Replaced with the following HEP:   Access Code: [email or web address] URL: [email or web address] Date: 08/10/2019 Prepared by: Misty Stanley  Exercises Sit to Stand with Hands on Knees - 10 reps - 1 sets - 1x daily - 7x weekly Heel Toe Raises with Counter Support - 8 reps - 1 sets - 1x daily - 7x weekly Standing Single Leg Stance with Counter Support - 2 sets - 5 seconds hold - 1x daily - 7x weekly Supine Scapular Retraction - 10 reps - 1 sets - 1x daily - 7x weekly Supine Quad Set - 8 reps - 2 sets - 4-5 seconds each press hold - 1x daily - 7x weekly Supine Straight Leg Raises - 5 reps - 2 sets - 1x daily - 7x weekly Supine Hip Adduction Isometric with Ball - 10 reps - 3 sets - 1x daily - 7x weekly

## 2019-08-16 NOTE — Therapy (Signed)
[facility] [address] [place], [place], [address] Phone: [phone]   Fax:  [phone]  Physical Therapy Treatment  Patient Details  Name: [name] MRN: [id number] Date of Birth: [birth year] Referring Provider (PT): Referred by [facility] physician; certification will be sent to PCP - Chesley Noon, MD   Encounter Date: 08/16/2019  PT End of Session - 08/16/19 1555    Visit Number  5    Number of Visits  17    Date for PT Re-Evaluation  09/17/19    Authorization Type  [employer] $40 copay.  10th visit PN    Progress Note Due on Visit  10    PT Start Time  1445    PT Stop Time  1530    PT Time Calculation (min)  45 min    Activity Tolerance  Patient limited by pain;Patient tolerated treatment well    Behavior During Therapy  [facility] for tasks assessed/performed       Past Medical History:  Diagnosis Date  . Cancer of right colon ([place])   . Constipation   . Coronary artery disease   . CVA (cerebral infarction) 1996  . Dyspnea    going up stairs and hills  . Family history of colon cancer   . GERD (gastroesophageal reflux disease)   . History of cardiomegaly   . Hyperlipidemia   . Hypertension   . Iron deficiency anemia   . Myocardial infarction ([place]) 2017  . NSTEMI (non-ST elevated myocardial infarction) ([place])   . Personal history of colonic polyps   . PONV (postoperative nausea and vomiting)   . Right shoulder pain    more painful at night , cold makes it worse  . Severe obesity ([place])     Past Surgical History:  Procedure Laterality Date  . ABDOMINAL HYSTERECTOMY    . BACK SURGERY     x2  . CARDIAC CATHETERIZATION N/A 02/28/2016   Procedure: Left Heart Cath and Coronary Angiography;  Surgeon: Peter M Martinique, MD;  Location: [facility];  Service: Cardiovascular;  Laterality: N/A;  . CARDIAC CATHETERIZATION N/A 02/28/2016   Procedure: Coronary Stent Intervention;  Surgeon: Peter M Martinique,  MD;  Location: [facility];  Service: Cardiovascular;  Laterality: N/A;  . CHOLECYSTECTOMY    . COLONOSCOPY  11/24/2018  . CORONARY ANGIOPLASTY    . CYST EXCISION Left    Cheek  . LAPAROSCOPIC RIGHT HEMI COLECTOMY Right 01/17/2019   Procedure: LAPAROSCOPIC RIGHT HEMI COLECTOMY;  Surgeon: Alphonsa Overall, MD;  Location: [facility];  Service: General;  Laterality: Right;  . NASAL FRACTURE SURGERY    . PORTACATH PLACEMENT N/A 02/22/2019   Procedure: POWER PORT PLACEMENT;  Surgeon: Alphonsa Overall, MD;  Location: [facility];  Service: General;  Laterality: N/A;  . UPPER GI ENDOSCOPY  11/24/2018    There were no vitals filed for this visit.  Subjective Assessment - 08/16/19 1449    Subjective  Feeling better today; trying to stand up tall with walking.  Has questions about two of the exercises lying down.  No back pain with new exercises.    Pertinent History  cancer of R colon with colectomy and chemotherapy, constipation, CAD, CVA, cardiomegaly, HLD, HTN, anemia, NSTEMI, obesity, chronic back pain after back surgery, and a history of motion sickness    Patient Stated Goals  To get back on her feet and walk without feeling like she is going to fall - started last June after  finding out she had colon CA - 18 inches removed + chemo.    Currently in Pain?  No/denies                       [facility] Adult PT Treatment/Exercise - 08/16/19 1541      Bed Mobility   Bed Mobility  Rolling Left;Left Sidelying to Sit;Sit to Supine    Rolling Left  Moderate Assistance - Patient 50-74%    Left Sidelying to Sit  Moderate Assistance - Patient 50-74%    Sit to Supine  Moderate Assistance - Patient 50-74%      Transfers   Transfers  Sit to Stand;Stand to Sit    Sit to Stand  5: Supervision    Stand to Sit  5: Supervision      Ambulation/Gait   Ambulation/Gait  Yes    Ambulation/Gait Assistance  4: Min guard    Ambulation/Gait Assistance Details  raised rollator up one level to prevent increased  trunk and forward flexion during ambulation; intermittent cues for more upright posture and shoulder retraction and depression when ambulating with rollator; verbal and visual cues for increased stance time and heel strike bilaterally.      Ambulation Distance (Feet)  115 Feet    Assistive device  4-wheeled walker    Gait Pattern  Step-through pattern;Decreased step length - left;Decreased step length - right;Decreased stance time - right;Decreased stance time - left;Decreased stride length;Decreased hip/knee flexion - right;Decreased dorsiflexion - right;Decreased hip/knee flexion - left;Decreased dorsiflexion - left;Shuffle;Right flexed knee in stance;Left flexed knee in stance;Trunk flexed;Poor foot clearance - left;Poor foot clearance - right    Ambulation Surface  Level;Indoor      Exercises   Exercises  Shoulder;Neck;Knee/Hip      Neck Exercises: Supine   Neck Retraction  10 reps;5 secs    Neck Retraction Limitations  with chin tuck       Knee/Hip Exercises: Supine   Quad Sets  Strengthening;Both;1 set;10 reps    Quad Sets Limitations  no issues with sequencing this exercise    Straight Leg Raises  Strengthening;Right;Left;1 set;10 reps    Straight Leg Raises Limitations  with contralateral LE flexed to decrease strain on low back      Shoulder Exercises: Supine   External Rotation  Strengthening;Right;Left;10 reps    External Rotation Limitations  ER with scapular retraction initially bilaterally but pt demonstrated increased difficulty coordinating both sides simultaneously and unable to perform on L side without upper trap activation.  Focused on one UE at a time - able to perform on R with only supervision.  On L side pt required upper trap stretch and activation of shoulder depression before pt could sequence scapular retraction and shoulder ER.  After performing individually pt able to perform bilateral activation x 5 reps with supervision      Shoulder Exercises: Standing    Flexion  Strengthening;Both;5 reps;Weights    Shoulder Flexion Weight (lbs)  holding one 2lb weight in bilat UE - performed 5 reps with feet apart and then with feet together for balance training.  Focus on posture - cues to activated hip and knee extensors, upright trunk with shoulder retraction and slight depression prior to lifting weight to 90 deg to encourage thoracic extension, balance training and core stabilization in standing without UE support             PT Education - 08/16/19 1554    Education Details  reviewed scapular retraction and  quad sets; also performed cervical retraction but did not add to HEP, changed height of rollator and reviewed standing posture    Person(s) Educated  Patient    Methods  Explanation;Demonstration    Comprehension  Need further instruction       PT Short Term Goals - 07/28/19 1637      PT SHORT TERM GOAL #1   Title  Pt will participate in full vestibular assessment    Baseline  no significant vestibular findings noted    Time  4    Period  Weeks    Status  Achieved    Target Date  08/18/19      PT SHORT TERM GOAL #2   Title  Pt will improve LE strength as indicated by decrease in time to perform five time sit to stand to </= 19 seconds with use of UE    Baseline  23 seconds with use of UE    Time  4    Period  Weeks    Status  New    Target Date  08/18/19      PT SHORT TERM GOAL #3   Title  Pt will improve gait velocity with LRAD to >/= 1.8 ft/sec with supervision    Baseline  1.64 ft/sec    Time  4    Period  Weeks    Status  New    Target Date  08/18/19      PT SHORT TERM GOAL #4   Title  Pt will increase BERG to >/= 42/56 to indicate decreased falls risk    Baseline  38/56    Time  4    Period  Weeks    Status  New    Target Date  08/18/19      PT SHORT TERM GOAL #5   Title  Pt will demonstrate independence with initial vestibular, balance and LE strengthening HEP    Time  4    Period  Weeks    Status  New     Target Date  08/18/19        PT Long Term Goals - 07/19/19 1654      PT LONG TERM GOAL #1   Title  Pt will be independent with final HEP for vestibular, balance and strengthening    Time  8    Period  Weeks    Status  New    Target Date  09/17/19      PT LONG TERM GOAL #2   Title  Will report 18 point decrease in [place]    Baseline  84 severe    Time  8    Period  Weeks    Status  New    Target Date  09/17/19      PT LONG TERM GOAL #3   Title  Pt will increase gait velocity with LRAD to >/= 2.0 ft/sec    Time  8    Period  Weeks    Status  New    Target Date  09/17/19      PT LONG TERM GOAL #4   Title  Pt will increase BERG to >/= 46/56 to indicate decreased risk for falls    Time  8    Period  Weeks    Status  New    Target Date  09/17/19      PT LONG TERM GOAL #5   Title  Pt will decrease time to perform five time sit to stand to </=  15 seconds    Time  8    Period  Weeks    Status  New    Target Date  09/17/19      Additional Long Term Goals   Additional Long Term Goals  Yes      PT LONG TERM GOAL #6   Title  Pt will report ability to participate in MRADL - cooking, laundry, cleaning - with supervision of husband or daughter and will report improvement in function on FOTO to >/= 62%    Baseline  28% function    Time  8    Period  Weeks    Status  New    Target Date  09/17/19            Plan - 08/16/19 1557    Clinical Impression Statement  Continued to review HEP and perform postural exercises and postural education during ambulation with rollator.  Pt demonstrates significant mm tension and decrease ROM on L side and requires significant cues for correct mm activation and sequence.   No increase in back pain today with exercises.  Will continue to address in order to progress towards LTG.    Personal Factors and Comorbidities  Comorbidity 3+;Finances;Fitness;Transportation    Comorbidities  cancer of R colon with colectomy and chemotherapy,  constipation, CAD, CVA, cardiomegaly, HLD, HTN, anemia, NSTEMI, obesity, chronic back pain after back surgery, and a history of motion sickness    Examination-Activity Limitations  Bend;Locomotion Level;Stairs;Stand    Examination-Participation Restrictions  Cleaning;Laundry;Meal Prep    Stability/Clinical Decision Making  Evolving/Moderate complexity    Rehab Potential  Good    PT Frequency  2x / week    PT Duration  8 weeks    PT Treatment/Interventions  ADLs/Self Care Home Management;Canalith Repostioning;Cryotherapy;Moist Heat;DME Instruction;Gait training;Stair training;Functional mobility training;Therapeutic activities;Therapeutic exercise;Balance training;Neuromuscular re-education;Patient/family education;Orthotic Fit/Training;Vestibular    PT Next Visit Plan  STG due on MONDAY.    Endurance on [place].  Postural exercises, balance with decreased UE support, weight shifting and SLS activities    Consulted and Agree with Plan of Care  Patient       Patient will benefit from skilled therapeutic intervention in order to improve the following deficits and impairments:  Decreased activity tolerance, Decreased balance, Decreased endurance, Decreased strength, Difficulty walking, Dizziness, Impaired sensation, Postural dysfunction, Pain  Visit Diagnosis: Muscle weakness (generalized)  Unsteadiness on feet  Repeated falls  Other disturbances of skin sensation  Dizziness and giddiness  Difficulty in walking, not elsewhere classified  Abnormal posture     Problem List Patient Active Problem List   Diagnosis Date Noted  . Generalized weakness 07/05/2019  . Diarrhea 07/05/2019  . Fall at home, initial encounter 07/05/2019  . Port-A-Cath in place 02/23/2019  . Family history of colon cancer   . Personal history of colonic polyps   . History of MI (myocardial infarction) 01/22/2019  . Severe obesity ([place])   . PONV (postoperative nausea and vomiting)   . Iron deficiency anemia    . Hypertension   . Hyperlipidemia   . History of cardiomegaly   . GERD (gastroesophageal reflux disease)   . Cancer of right colon - resected 01/17/2019 01/17/2019  . CAD S/P percutaneous coronary angioplasty 07/10/2016  . Essential hypertension 02/28/2016  . Dyslipidemia, goal LDL below 70 02/28/2016    Rico Junker, PT, DPT 08/16/19    4:03 PM    [facility] [address] [place], [place], [address] Phone:  [phone]   Fax:  [phone]  Name: Yarelly Buri MRN: [id number] Date of Birth: May 06, 1946

## 2019-08-18 ENCOUNTER — Ambulatory Visit: Payer: Medicare Other | Admitting: Physical Therapy

## 2019-08-21 ENCOUNTER — Ambulatory Visit: Payer: Medicare Other | Admitting: Physical Therapy

## 2019-08-21 ENCOUNTER — Encounter: Payer: Self-pay | Admitting: Physical Therapy

## 2019-08-21 ENCOUNTER — Other Ambulatory Visit: Payer: Self-pay

## 2019-08-21 DIAGNOSIS — R296 Repeated falls: Secondary | ICD-10-CM

## 2019-08-21 DIAGNOSIS — R208 Other disturbances of skin sensation: Secondary | ICD-10-CM

## 2019-08-21 DIAGNOSIS — R293 Abnormal posture: Secondary | ICD-10-CM

## 2019-08-21 DIAGNOSIS — R2681 Unsteadiness on feet: Secondary | ICD-10-CM

## 2019-08-21 DIAGNOSIS — R262 Difficulty in walking, not elsewhere classified: Secondary | ICD-10-CM

## 2019-08-21 DIAGNOSIS — M6281 Muscle weakness (generalized): Secondary | ICD-10-CM | POA: Diagnosis not present

## 2019-08-21 NOTE — Patient Instructions (Signed)
Access Code: [email or web address] - deleted this exercise program - unable to perform multiple standing exercises at this time.  Replaced with the following HEP:   Access Code: [email or web address] URL: [email or web address] Date: 08/10/2019 Prepared by: Misty Stanley  Exercises Sit to Stand with Hands on Knees - 10 reps - 1 sets - 1x daily - 7x weekly Heel Toe Raises with Counter Support - 8 reps - 1 sets - 1x daily - 7x weekly Standing Single Leg Stance with Counter Support - 2 sets - 5 seconds hold - 1x daily - 7x weekly Supine Scapular Retraction - 10 reps - 1 sets - 1x daily - 7x weekly Supine Quad Set - 8 reps - 2 sets - 4-5 seconds each press hold - 1x daily - 7x weekly Supine Straight Leg Raises - 5 reps - 2 sets - 1x daily - 7x weekly Supine Hip Adduction Isometric with Ball - 10 reps - 3 sets - 1x daily - 7x weekly

## 2019-08-22 NOTE — Therapy (Signed)
[facility] [address] [place], [place], [address] Phone: [phone]   Fax:  [phone]  Physical Therapy Treatment  Patient Details  Name: [name] MRN: [id number] Date of Birth: March 31, 1946 Referring Provider (PT): Referred by [facility] physician; certification will be sent to PCP - Chesley Noon, MD   Encounter Date: 08/21/2019  PT End of Session - 08/22/19 1714    Visit Number  6    Number of Visits  17    Date for PT Re-Evaluation  09/17/19    Authorization Type  [employer] $40 copay.  10th visit PN    Progress Note Due on Visit  10    PT Start Time  1530    PT Stop Time  1620    PT Time Calculation (min)  50 min    Activity Tolerance  Patient tolerated treatment well    Behavior During Therapy  WFL for tasks assessed/performed       Past Medical History:  Diagnosis Date  . Cancer of right colon ([place])   . Constipation   . Coronary artery disease   . CVA (cerebral infarction) 1996  . Dyspnea    going up stairs and hills  . Family history of colon cancer   . GERD (gastroesophageal reflux disease)   . History of cardiomegaly   . Hyperlipidemia   . Hypertension   . Iron deficiency anemia   . Myocardial infarction ([place]) 2017  . NSTEMI (non-ST elevated myocardial infarction) ([place])   . Personal history of colonic polyps   . PONV (postoperative nausea and vomiting)   . Right shoulder pain    more painful at night , cold makes it worse  . Severe obesity ([place])     Past Surgical History:  Procedure Laterality Date  . ABDOMINAL HYSTERECTOMY    . BACK SURGERY     x2  . CARDIAC CATHETERIZATION N/A 02/28/2016   Procedure: Left Heart Cath and Coronary Angiography;  Surgeon: Peter M Martinique, MD;  Location: [facility];  Service: Cardiovascular;  Laterality: N/A;  . CARDIAC CATHETERIZATION N/A 02/28/2016   Procedure: Coronary Stent Intervention;  Surgeon: Peter M Martinique, MD;  Location: [facility];  Service: Cardiovascular;  Laterality: N/A;  . CHOLECYSTECTOMY    . COLONOSCOPY  11/24/2018  . CORONARY ANGIOPLASTY    . CYST EXCISION Left    Cheek  . LAPAROSCOPIC RIGHT HEMI COLECTOMY Right 01/17/2019   Procedure: LAPAROSCOPIC RIGHT HEMI COLECTOMY;  Surgeon: Alphonsa Overall, MD;  Location: [facility];  Service: General;  Laterality: Right;  . NASAL FRACTURE SURGERY    . PORTACATH PLACEMENT N/A 02/22/2019   Procedure: POWER PORT PLACEMENT;  Surgeon: Alphonsa Overall, MD;  Location: [facility];  Service: General;  Laterality: N/A;  . UPPER GI ENDOSCOPY  11/24/2018    There were no vitals filed for this visit.  Subjective Assessment - 08/21/19 1536    Subjective  Pt walking more upright today; had some back pain after last session but thinks it was because of lying flat on hard mat.  Exercises are going well. Husband is starting to see progress.    Pertinent History  cancer of R colon with colectomy and chemotherapy, constipation, CAD, CVA, cardiomegaly, HLD, HTN, anemia, NSTEMI, obesity, chronic back pain after back surgery, and a history of motion sickness    Patient Stated Goals  To get back on her feet and walk without feeling like she is going to fall -  started last June after finding out she had colon CA - 18 inches removed + chemo.    Currently in Pain?  No/denies         [facility] PT Assessment - 08/21/19 1540      Ambulation/Gait   Ambulation/Gait  Yes    Ambulation/Gait Assistance  5: Supervision    Ambulation/Gait Assistance Details  with rollator; decreased shuffling gait, more upright posture    Ambulation Distance (Feet)  115 Feet    Assistive device  4-wheeled walker    Gait Pattern  Step-through pattern;Decreased step length - left;Decreased step length - right;Decreased stride length;Decreased hip/knee flexion - right;Decreased dorsiflexion - right;Decreased hip/knee flexion - left;Decreased dorsiflexion - left;Shuffle;Right flexed knee in stance;Left flexed knee in  stance;Poor foot clearance - left;Poor foot clearance - right;Right foot flat;Left foot flat    Ambulation Surface  Level;Indoor      Standardized Balance Assessment   Standardized Balance Assessment  Berg Balance Test;Five Times Sit to Stand;10 meter walk test    Five times sit to stand comments   22 seconds with use of UE on arm rests    10 Meter Walk  19 seconds or 1.72 ft/sec - with rollator with more upright posture and decreased shuffling      Berg Balance Test   Sit to Stand  Able to stand  independently using hands    Standing Unsupported  Able to stand safely 2 minutes    Sitting with Back Unsupported but Feet Supported on Floor or Stool  Able to sit safely and securely 2 minutes    Stand to Sit  Controls descent by using hands    Transfers  Able to transfer safely, minor use of hands    Standing Unsupported with Eyes Closed  Able to stand 10 seconds with supervision    Standing Unsupported with Feet Together  Able to place feet together independently and stand for 1 minute with supervision    From Standing, Reach Forward with Outstretched Arm  Can reach forward >12 cm safely (5")    From Standing Position, Pick up Object from [place] to pick up shoe safely and easily    From Standing Position, Turn to Look Behind Over each Shoulder  Looks behind from both sides and weight shifts well    Turn 360 Degrees  Needs assistance while turning    Standing Unsupported, Alternately Place Feet on Step/Stool  Able to complete 4 steps without aid or supervision    Standing Unsupported, One Foot in Front  Able to take small step independently and hold 30 seconds    Standing on One Leg  Tries to lift leg/unable to hold 3 seconds but remains standing independently    Total Score  40    Berg comment:  40/56                   [facility] Adult PT Treatment/Exercise - 08/21/19 1540      Therapeutic Activites    Therapeutic Activities  Other Therapeutic Activities    Other Therapeutic  Activities  Patient reports she is ambulating around her home without an AD.  She also reports her husband bought her a cane.  Discussed safety risks and falls risk ambulating without AD right now.  Recommending that if pt's home does not support use of rollator (rooms/doorways too small) to bring in cane next session and will initiate gait training with cane for pt to begin to use at home.  Discussed  pt's progress - pt wishes she could continue to come 2x/week but continues to feel concerned about finances, high copay and would like to return to one time a week.            Balance Exercises - 08/22/19 1709      Balance Exercises: Standing   Heel Raises  Right;Left;5 reps    Toe Raise  Right;Left;5 reps    Other Standing Exercises Comments  Performed heel and toe raises in staggered stance in combination with anterior/posterior weight shifting to simulate diagonal weight shifting, heel lift during terminal stance and heel strike at initial stance during gait cycle; performed with bilat UE support and cues to advance COG forwards over front stance LE, tactile cues provided at pelvis and cues for closed chain ankle DF        PT Education - 08/22/19 1714    Education Details  progress towards STG, see TA    Person(s) Educated  Patient    Methods  Explanation    Comprehension  Verbalized understanding       PT Short Term Goals - 08/21/19 1539      PT SHORT TERM GOAL #1   Title  Pt will participate in full vestibular assessment    Baseline  no significant vestibular findings noted    Time  4    Period  Weeks    Status  Achieved    Target Date  08/18/19      PT SHORT TERM GOAL #2   Title  Pt will improve LE strength as indicated by decrease in time to perform five time sit to stand to </= 19 seconds with use of UE    Baseline  22 seconds with UE    Time  4    Period  Weeks    Status  Partially Met    Target Date  08/18/19      PT SHORT TERM GOAL #3   Title  Pt will improve gait  velocity with LRAD to >/= 1.8 ft/sec with supervision    Baseline  1.64 ft/sec 1.7 ft/sec    Time  4    Period  Weeks    Status  Partially Met    Target Date  08/18/19      PT SHORT TERM GOAL #4   Title  Pt will increase BERG to >/= 42/56 to indicate decreased falls risk    Baseline  40/56    Time  4    Period  Weeks    Status  Partially Met    Target Date  08/18/19      PT SHORT TERM GOAL #5   Title  Pt will demonstrate independence with initial vestibular, balance and LE strengthening HEP    Time  4    Period  Weeks    Status  Achieved    Target Date  08/18/19        PT Long Term Goals - 08/22/19 1728      PT LONG TERM GOAL #1   Title  Pt will be independent with final HEP for vestibular, balance and strengthening  (LTG due by 09/17/2019)    Time  8    Period  Weeks    Status  New      PT LONG TERM GOAL #2   Title  Will report 18 point decrease in [place]    Baseline  84 severe    Time  8    Period  Weeks  Status  New      PT LONG TERM GOAL #3   Title  Pt will increase gait velocity with LRAD to >/= 2.0 ft/sec    Baseline  1.7    Time  8    Period  Weeks    Status  Revised      PT LONG TERM GOAL #4   Title  Pt will increase BERG to >/= 44/56 to indicate decreased risk for falls    Baseline  40/56    Time  8    Period  Weeks    Status  Revised      PT LONG TERM GOAL #5   Title  Pt will decrease time to perform five time sit to stand to </= 18 seconds    Baseline  22 seconds with UE use    Time  8    Period  Weeks    Status  Revised      PT LONG TERM GOAL #6   Title  Pt will report ability to participate in [place], laundry, cleaning - with supervision of husband or daughter and will report improvement in function on FOTO to >/= 62%    Baseline  28% function    Time  8    Period  Weeks    Status  New            Plan - 08/22/19 1715    Clinical Impression Statement  Performed assessment of progress towards STG.  Pt is making slow but  steady progress towards goals and demonstrates improvement in gait velocity, standing balance and five time sit to stand but not to goal.  Pt is demonstrating improved upright posture, improved foot clearance during gait and improved balance when standing/transferring without UE support.  Pt continues to have difficulty performing prescribed HEP and has required two revisions of HEP.  Pt continues to be at increased falls risk and PT recommends use of AD when ambulating in her home.  Pt to bring in cane at next session to practice with before using at home; pt admits to ambulating around her home without any AD at all.  Discussed safety concerns and falls risk with patient - patient verbalized understanding - will provide recommendations with cane at next session.  Pt continues to feel concerned about finances and requested to return to 1x/week .  Pt to discuss with husband adding more visits (1x/week) at next session.  Appointments adjusted per patient's request.    Personal Factors and Comorbidities  Comorbidity 3+;Finances;Fitness;Transportation    Comorbidities  cancer of R colon with colectomy and chemotherapy, constipation, CAD, CVA, cardiomegaly, HLD, HTN, anemia, NSTEMI, obesity, chronic back pain after back surgery, and a history of motion sickness    Examination-Activity Limitations  Bend;Locomotion Level;Stairs;Stand    Examination-Participation Restrictions  Cleaning;Laundry;Meal Prep    Stability/Clinical Decision Making  Evolving/Moderate complexity    Rehab Potential  Good    PT Frequency  2x / week    PT Duration  8 weeks    PT Treatment/Interventions  ADLs/Self Care Home Management;Canalith Repostioning;Cryotherapy;Moist Heat;DME Instruction;Gait training;Stair training;Functional mobility training;Therapeutic activities;Therapeutic exercise;Balance training;Neuromuscular re-education;Patient/family education;Orthotic Fit/Training;Vestibular    PT Next Visit Plan  Gait training with cane  for home.  (Schedule more visits, 1x/week??)   Endurance on Nustep.  Postural exercises, balance with decreased UE support, weight shifting and SLS activities    PT Home Exercise Plan  [id number]    Consulted and Agree with Plan of Care  Patient       Patient will benefit from skilled therapeutic intervention in order to improve the following deficits and impairments:  Decreased activity tolerance, Decreased balance, Decreased endurance, Decreased strength, Difficulty walking, Dizziness, Impaired sensation, Postural dysfunction, Pain  Visit Diagnosis: Muscle weakness (generalized)  Unsteadiness on feet  Repeated falls  Other disturbances of skin sensation  Difficulty in walking, not elsewhere classified  Abnormal posture     Problem List Patient Active Problem List   Diagnosis Date Noted  . Generalized weakness 07/05/2019  . Diarrhea 07/05/2019  . Fall at home, initial encounter 07/05/2019  . Port-A-Cath in place 02/23/2019  . Family history of colon cancer   . Personal history of colonic polyps   . History of MI (myocardial infarction) 01/22/2019  . Severe obesity ([place])   . PONV (postoperative nausea and vomiting)   . Iron deficiency anemia   . Hypertension   . Hyperlipidemia   . History of cardiomegaly   . GERD (gastroesophageal reflux disease)   . Cancer of right colon - resected 01/17/2019 01/17/2019  . CAD S/P percutaneous coronary angioplasty 07/10/2016  . Essential hypertension 02/28/2016  . Dyslipidemia, goal LDL below 70 02/28/2016    Rico Junker, PT, DPT 08/22/19    5:31 PM    [facility] [address] [place], [place], [address] Phone: [phone]   Fax:  [phone]  Name: [name] MRN: [id number] Date of Birth: [birth year]

## 2019-08-25 ENCOUNTER — Other Ambulatory Visit: Payer: Self-pay

## 2019-08-25 ENCOUNTER — Ambulatory Visit: Payer: Medicare Other | Admitting: Physical Therapy

## 2019-08-25 ENCOUNTER — Encounter: Payer: Self-pay | Admitting: Physical Therapy

## 2019-08-25 DIAGNOSIS — R208 Other disturbances of skin sensation: Secondary | ICD-10-CM

## 2019-08-25 DIAGNOSIS — R262 Difficulty in walking, not elsewhere classified: Secondary | ICD-10-CM

## 2019-08-25 DIAGNOSIS — M6281 Muscle weakness (generalized): Secondary | ICD-10-CM | POA: Diagnosis not present

## 2019-08-25 DIAGNOSIS — R293 Abnormal posture: Secondary | ICD-10-CM

## 2019-08-25 DIAGNOSIS — R296 Repeated falls: Secondary | ICD-10-CM

## 2019-08-25 DIAGNOSIS — R2681 Unsteadiness on feet: Secondary | ICD-10-CM

## 2019-08-26 NOTE — Therapy (Signed)
[facility] [address] [place], [place], [address] Phone: [phone]   Fax:  [phone]  Physical Therapy Treatment  Patient Details  Name: [name] MRN: [id number] Date of Birth: Feb 03, 1946 Referring Provider (PT): Referred by [facility] physician; certification will be sent to PCP - Chesley Noon, MD   Encounter Date: 08/25/2019  PT End of Session - 08/26/19 1344    Visit Number  7    Number of Visits  17    Date for PT Re-Evaluation  09/17/19    Authorization Type  [employer] $40 copay.  10th visit PN    Progress Note Due on Visit  10    PT Start Time  0245    PT Stop Time  0330    PT Time Calculation (min)  45 min    Activity Tolerance  Patient tolerated treatment well    Behavior During Therapy  WFL for tasks assessed/performed       Past Medical History:  Diagnosis Date  . Cancer of right colon ([place])   . Constipation   . Coronary artery disease   . CVA (cerebral infarction) 1996  . Dyspnea    going up stairs and hills  . Family history of colon cancer   . GERD (gastroesophageal reflux disease)   . History of cardiomegaly   . Hyperlipidemia   . Hypertension   . Iron deficiency anemia   . Myocardial infarction ([place]) 2017  . NSTEMI (non-ST elevated myocardial infarction) ([place])   . Personal history of colonic polyps   . PONV (postoperative nausea and vomiting)   . Right shoulder pain    more painful at night , cold makes it worse  . Severe obesity ([place])     Past Surgical History:  Procedure Laterality Date  . ABDOMINAL HYSTERECTOMY    . BACK SURGERY     x2  . CARDIAC CATHETERIZATION N/A 02/28/2016   Procedure: Left Heart Cath and Coronary Angiography;  Surgeon: Peter M Martinique, MD;  Location: [facility];  Service: Cardiovascular;  Laterality: N/A;  . CARDIAC CATHETERIZATION N/A 02/28/2016   Procedure: Coronary Stent Intervention;  Surgeon: Peter M Martinique, MD;  Location: [facility];  Service: Cardiovascular;  Laterality: N/A;  . CHOLECYSTECTOMY    . COLONOSCOPY  11/24/2018  . CORONARY ANGIOPLASTY    . CYST EXCISION Left    Cheek  . LAPAROSCOPIC RIGHT HEMI COLECTOMY Right 01/17/2019   Procedure: LAPAROSCOPIC RIGHT HEMI COLECTOMY;  Surgeon: Alphonsa Overall, MD;  [facility];  Service: General;  Laterality: Right;  . NASAL FRACTURE SURGERY    . PORTACATH PLACEMENT N/A 02/22/2019   Procedure: POWER PORT PLACEMENT;  Surgeon: Alphonsa Overall, MD;  Location: [facility];  Service: General;  Laterality: N/A;  . UPPER GI ENDOSCOPY  11/24/2018    There were no vitals filed for this visit.  Subjective Assessment - 08/25/19 1449    Subjective  Pt did speak with husband and she would still like to decrease frequency to 1x/week due to finances.  Will schedule more visits today.    Pertinent History  cancer of R colon with colectomy and chemotherapy, constipation, CAD, CVA, cardiomegaly, HLD, HTN, anemia, NSTEMI, obesity, chronic back pain after back surgery, and a history of motion sickness    Patient Stated Goals  To get back on her feet and walk without feeling like she is going to fall - started last June after finding out she had colon CA -  18 inches removed + chemo.    Currently in Pain?  No/denies                       [facility] Adult PT Treatment/Exercise - 08/25/19 1453      Ambulation/Gait   Ambulation/Gait  Yes    Ambulation/Gait Assistance  5: Supervision    Ambulation/Gait Assistance Details  with rollator with focus on heel strike at initial contact to increase step and stride length - pt had to slow gait velocity to be able to perform heel strike.  As pt fatigued or ambulated at faster speeds she returned to more of a shuffling gait    Ambulation Distance (Feet)  115 Feet    Assistive device  4-wheeled walker    Gait Pattern  Step-through pattern;Decreased dorsiflexion - left;Decreased dorsiflexion - right;Trunk flexed    Ambulation Surface   Level;Indoor      Exercises   Exercises  Ankle      Knee/Hip Exercises: Standing   Heel Raises  Right;Left;1 set;15 reps    Heel Raises Limitations  in staggered stance performed alternating toe raises with front foot when weight shifting to back leg with heel raises when shifting weight forwards over front foot.  Cues from therapist for weight shift through pelvis and for knee extension during stance.  Pt had greater difficulty shifting anteriorly over R foot with closed chain ankle DF and compensated with heel raise on R to keep pelvis back and trunk flexed; pt lacks terminal hip extension.    Knee Flexion  15 reps      Ankle Exercises: Stretches   Gastroc Stretch  30 seconds;4 reps    Gastroc Stretch Limitations  standing on step, one heel dropping off and then bilat heels dropping off.  Not able to achieve as much DF ROM on L as R          Balance Exercises - 08/25/19 1520      Balance Exercises: Standing   Tandem Gait  Forward;Upper extremity support;2 reps   2 sets with UE support on counter; on seated rest break       PT Education - 08/26/19 1343    Education Details  gait training, weight shift training    Person(s) Educated  Patient    Methods  Explanation;Demonstration;Tactile cues;Verbal cues    Comprehension  Need further instruction       PT Short Term Goals - 08/21/19 1539      PT SHORT TERM GOAL #1   Title  Pt will participate in full vestibular assessment    Baseline  no significant vestibular findings noted    Time  4    Period  Weeks    Status  Achieved    Target Date  08/18/19      PT SHORT TERM GOAL #2   Title  Pt will improve LE strength as indicated by decrease in time to perform five time sit to stand to </= 19 seconds with use of UE    Baseline  22 seconds with UE    Time  4    Period  Weeks    Status  Partially Met    Target Date  08/18/19      PT SHORT TERM GOAL #3   Title  Pt will improve gait velocity with LRAD to >/= 1.8 ft/sec with  supervision    Baseline  1.64 ft/sec 1.7 ft/sec    Time  4  Period  Weeks    Status  Partially Met    Target Date  08/18/19      PT SHORT TERM GOAL #4   Title  Pt will increase BERG to >/= 42/56 to indicate decreased falls risk    Baseline  40/56    Time  4    Period  Weeks    Status  Partially Met    Target Date  08/18/19      PT SHORT TERM GOAL #5   Title  Pt will demonstrate independence with initial vestibular, balance and LE strengthening HEP    Time  4    Period  Weeks    Status  Achieved    Target Date  08/18/19        PT Long Term Goals - 08/22/19 1728      PT LONG TERM GOAL #1   Title  Pt will be independent with final HEP for vestibular, balance and strengthening  (LTG due by 09/17/2019)    Time  8    Period  Weeks    Status  New      PT LONG TERM GOAL #2   Title  Will report 18 point decrease in [place]    Baseline  84 severe    Time  8    Period  Weeks    Status  New      PT LONG TERM GOAL #3   Title  Pt will increase gait velocity with LRAD to >/= 2.0 ft/sec    Baseline  1.7    Time  8    Period  Weeks    Status  Revised      PT LONG TERM GOAL #4   Title  Pt will increase BERG to >/= 44/56 to indicate decreased risk for falls    Baseline  40/56    Time  8    Period  Weeks    Status  Revised      PT LONG TERM GOAL #5   Title  Pt will decrease time to perform five time sit to stand to </= 18 seconds    Baseline  22 seconds with UE use    Time  8    Period  Weeks    Status  Revised      PT LONG TERM GOAL #6   Title  Pt will report ability to participate in [place] - cooking, laundry, cleaning - with supervision of husband or daughter and will report improvement in function on FOTO to >/= 62%    Baseline  28% function    Time  8    Period  Weeks    Status  New            Plan - 08/26/19 1344    Clinical Impression Statement  Due to continued shuffling gait and knee flexion in stance phase continued to perform weight shift training in  staggered stance and with tandem gait to promote more normal gait sequence, increased stance time and step/stride length.  Required gastroc stretches prior to performing weight shifting due to limited closed chain DF ROM.  Pt able to carry over heel strike and foot clearance during gait with rollator when ambulating at slowed velocity.  Will continue to address and progress towards LTG.    Personal Factors and Comorbidities  Comorbidity 3+;Finances;Fitness;Transportation    Comorbidities  cancer of R colon with colectomy and chemotherapy, constipation, CAD, CVA, cardiomegaly, HLD, HTN, anemia, NSTEMI, obesity, chronic back pain  after back surgery, and a history of motion sickness    Examination-Activity Limitations  Bend;Locomotion Level;Stairs;Stand    Examination-Participation Restrictions  Cleaning;Laundry;Meal Prep    Stability/Clinical Decision Making  Evolving/Moderate complexity    Rehab Potential  Good    PT Frequency  2x / week    PT Duration  8 weeks    PT Treatment/Interventions  ADLs/Self Care Home Management;Canalith Repostioning;Cryotherapy;Moist Heat;DME Instruction;Gait training;Stair training;Functional mobility training;Therapeutic activities;Therapeutic exercise;Balance training;Neuromuscular re-education;Patient/family education;Orthotic Fit/Training;Vestibular    PT Next Visit Plan  Gastroc stretch; staggered stance weight shifting, tandem gait to HEP?  Gait training with cane for home. Endurance on Nustep.  Postural exercises, balance with decreased UE support, weight shifting and SLS activities    PT Home Exercise Plan  [id number]    Consulted and Agree with Plan of Care  Patient       Patient will benefit from skilled therapeutic intervention in order to improve the following deficits and impairments:  Decreased activity tolerance, Decreased balance, Decreased endurance, Decreased strength, Difficulty walking, Dizziness, Impaired sensation, Postural dysfunction, Pain  Visit  Diagnosis: Muscle weakness (generalized)  Unsteadiness on feet  Repeated falls  Other disturbances of skin sensation  Difficulty in walking, not elsewhere classified  Abnormal posture     Problem List Patient Active Problem List   Diagnosis Date Noted  . Generalized weakness 07/05/2019  . Diarrhea 07/05/2019  . Fall at home, initial encounter 07/05/2019  . Port-A-Cath in place 02/23/2019  . Family history of colon cancer   . Personal history of colonic polyps   . History of MI (myocardial infarction) 01/22/2019  . Severe obesity ([place])   . PONV (postoperative nausea and vomiting)   . Iron deficiency anemia   . Hypertension   . Hyperlipidemia   . History of cardiomegaly   . GERD (gastroesophageal reflux disease)   . Cancer of right colon - resected 01/17/2019 01/17/2019  . CAD S/P percutaneous coronary angioplasty 07/10/2016  . Essential hypertension 02/28/2016  . Dyslipidemia, goal LDL below 70 02/28/2016    Rico Junker, PT, DPT 08/26/19    1:58 PM    [facility] [address] [place], [place], [address] Phone: [phone]   Fax:  [phone]  Name: [name] MRN: [id number] Date of Birth: 02/27/1946

## 2019-08-30 ENCOUNTER — Encounter: Payer: Medicare Other | Admitting: Physical Therapy

## 2019-09-01 ENCOUNTER — Other Ambulatory Visit: Payer: Self-pay

## 2019-09-01 ENCOUNTER — Encounter: Payer: Self-pay | Admitting: Physical Therapy

## 2019-09-01 ENCOUNTER — Ambulatory Visit: Payer: Medicare Other | Admitting: Physical Therapy

## 2019-09-01 ENCOUNTER — Encounter: Payer: Medicare Other | Admitting: Physical Therapy

## 2019-09-01 DIAGNOSIS — R208 Other disturbances of skin sensation: Secondary | ICD-10-CM

## 2019-09-01 DIAGNOSIS — R296 Repeated falls: Secondary | ICD-10-CM

## 2019-09-01 DIAGNOSIS — M6281 Muscle weakness (generalized): Secondary | ICD-10-CM | POA: Diagnosis not present

## 2019-09-01 DIAGNOSIS — R262 Difficulty in walking, not elsewhere classified: Secondary | ICD-10-CM

## 2019-09-01 DIAGNOSIS — R2681 Unsteadiness on feet: Secondary | ICD-10-CM

## 2019-09-01 DIAGNOSIS — R293 Abnormal posture: Secondary | ICD-10-CM

## 2019-09-01 NOTE — Therapy (Signed)
[facility] [address] [place], [place], [address] Phone: [phone]   Fax:  [phone]  Physical Therapy Treatment  Patient Details  Name: [name] MRN: [id number] Date of Birth: 08/20/1945 Referring Provider (PT): Referred by [facility] physician; certification will be sent to PCP - Chesley Noon, MD   Encounter Date: 09/01/2019  PT End of Session - 09/01/19 1600    Visit Number  8    Number of Visits  17    Date for PT Re-Evaluation  09/17/19    Authorization Type  [employer] $40 copay.  10th visit PN    Progress Note Due on Visit  10    PT Start Time  1445    PT Stop Time  1530    PT Time Calculation (min)  45 min    Activity Tolerance  Patient tolerated treatment well    Behavior During Therapy  WFL for tasks assessed/performed       Past Medical History:  Diagnosis Date  . Cancer of right colon ([place])   . Constipation   . Coronary artery disease   . CVA (cerebral infarction) 1996  . Dyspnea    going up stairs and hills  . Family history of colon cancer   . GERD (gastroesophageal reflux disease)   . History of cardiomegaly   . Hyperlipidemia   . Hypertension   . Iron deficiency anemia   . Myocardial infarction ([place]) 2017  . NSTEMI (non-ST elevated myocardial infarction) ([place])   . Personal history of colonic polyps   . PONV (postoperative nausea and vomiting)   . Right shoulder pain    more painful at night , cold makes it worse  . Severe obesity ([place])     Past Surgical History:  Procedure Laterality Date  . ABDOMINAL HYSTERECTOMY    . BACK SURGERY     x2  . CARDIAC CATHETERIZATION N/A 02/28/2016   Procedure: Left Heart Cath and Coronary Angiography;  Surgeon: Peter M Martinique, MD;  Location: [facility];  Service: Cardiovascular;  Laterality: N/A;  . CARDIAC CATHETERIZATION N/A 02/28/2016   Procedure: Coronary Stent Intervention;  Surgeon: Peter M Martinique, MD;  Location: [facility];  Service: Cardiovascular;  Laterality: N/A;  . CHOLECYSTECTOMY    . COLONOSCOPY  11/24/2018  . CORONARY ANGIOPLASTY    . CYST EXCISION Left    Cheek  . LAPAROSCOPIC RIGHT HEMI COLECTOMY Right 01/17/2019   Procedure: LAPAROSCOPIC RIGHT HEMI COLECTOMY;  Surgeon: Alphonsa Overall, MD;  Location: [facility];  Service: General;  Laterality: Right;  . NASAL FRACTURE SURGERY    . PORTACATH PLACEMENT N/A 02/22/2019   Procedure: POWER PORT PLACEMENT;  Surgeon: Alphonsa Overall, MD;  Location: [facility];  Service: General;  Laterality: N/A;  . UPPER GI ENDOSCOPY  11/24/2018    There were no vitals filed for this visit.  Subjective Assessment - 09/01/19 1452    Subjective  Nothing new to report; at times she feels like she is improving and then she feels like she isn't.  Didn't bring cane today; has tried it at home and still doesn't feel comfortable with it.    Pertinent History  cancer of R colon with colectomy and chemotherapy, constipation, CAD, CVA, cardiomegaly, HLD, HTN, anemia, NSTEMI, obesity, chronic back pain after back surgery, and a history of motion sickness    Limitations  Standing;Walking;House hold activities    Patient Stated Goals  To get back on her feet and  walk without feeling like she is going to fall - started last June after finding out she had colon CA - 18 inches removed + chemo.    Currently in Pain?  No/denies                       [facility] Adult PT Treatment/Exercise - 09/01/19 1501      Ambulation/Gait   Ambulation/Gait  Yes    Ambulation/Gait Assistance  4: Min assist    Ambulation/Gait Assistance Details  around gym with tripod cane with min A for balance - pt demonstrated more flexed posture, gaze down at ground and decreased step length and stride length, intermittent stepping on cane with R foot.  Pt reports walking with cane at home is more difficult due to tighter spaces and negotiating around obstacles    Ambulation Distance (Feet)  115 Feet     Assistive device  Straight cane    Gait Pattern  Step-through pattern;Decreased step length - right;Decreased step length - left;Decreased stride length;Decreased dorsiflexion - right;Decreased dorsiflexion - left;Right foot flat;Left foot flat;Right flexed knee in stance;Left flexed knee in stance;Trunk flexed;Narrow base of support    Ambulation Surface  Level;Indoor      Knee/Hip Exercises: Standing   Heel Raises  Right;1 set;10 reps    Heel Raises Limitations  staggered stance with L foot forwards and RUE holding cane; performing RLE heel lifts to push and advance COG forwards over L stance foot.  Cues to keep trunk up tall and to allow closed chain ankle DF to move knee and hip over L foot.  Mod A to perform    Forward Step Up  Right;Left;2 sets;10 reps;Hand Hold: 1;Step Height: 4"    Forward Step Up Limitations  before performing step ups performed taps to 4" step; 2 sets - one set x 10 with cane and LLE, one set with RLE only.  Progressed to step ups with cane; performed 1 set stepping up with cane and LLE x 10 reps with min A and then 10 reps leading with RLE with min A.    Step Down  Right;Left;2 sets;10 reps;Hand Hold: 1;Step Height: 4"    Step Down Limitations  posterior step downs after performing step ups, min-mod A due to intermittent posterior LOB or decreased clearance of foot when stepping back             PT Education - 09/01/19 1600    Education Details  gait train with cane    Person(s) Educated  Patient    Methods  Tactile cues;Verbal cues    Comprehension  Need further instruction       PT Short Term Goals - 08/21/19 1539      PT SHORT TERM GOAL #1   Title  Pt will participate in full vestibular assessment    Baseline  no significant vestibular findings noted    Time  4    Period  Weeks    Status  Achieved    Target Date  08/18/19      PT SHORT TERM GOAL #2   Title  Pt will improve LE strength as indicated by decrease in time to perform five time sit to  stand to </= 19 seconds with use of UE    Baseline  22 seconds with UE    Time  4    Period  Weeks    Status  Partially Met    Target Date  08/18/19  PT SHORT TERM GOAL #3   Title  Pt will improve gait velocity with LRAD to >/= 1.8 ft/sec with supervision    Baseline  1.64 ft/sec 1.7 ft/sec    Time  4    Period  Weeks    Status  Partially Met    Target Date  08/18/19      PT SHORT TERM GOAL #4   Title  Pt will increase BERG to >/= 42/56 to indicate decreased falls risk    Baseline  40/56    Time  4    Period  Weeks    Status  Partially Met    Target Date  08/18/19      PT SHORT TERM GOAL #5   Title  Pt will demonstrate independence with initial vestibular, balance and LE strengthening HEP    Time  4    Period  Weeks    Status  Achieved    Target Date  08/18/19        PT Long Term Goals - 08/22/19 1728      PT LONG TERM GOAL #1   Title  Pt will be independent with final HEP for vestibular, balance and strengthening  (LTG due by 09/17/2019)    Time  8    Period  Weeks    Status  New      PT LONG TERM GOAL #2   Title  Will report 18 point decrease in [place]    Baseline  84 severe    Time  8    Period  Weeks    Status  New      PT LONG TERM GOAL #3   Title  Pt will increase gait velocity with LRAD to >/= 2.0 ft/sec    Baseline  1.7    Time  8    Period  Weeks    Status  Revised      PT LONG TERM GOAL #4   Title  Pt will increase BERG to >/= 44/56 to indicate decreased risk for falls    Baseline  40/56    Time  8    Period  Weeks    Status  Revised      PT LONG TERM GOAL #5   Title  Pt will decrease time to perform five time sit to stand to </= 18 seconds    Baseline  22 seconds with UE use    Time  8    Period  Weeks    Status  Revised      PT LONG TERM GOAL #6   Title  Pt will report ability to participate in [place] - cooking, laundry, cleaning - with supervision of husband or daughter and will report improvement in function on FOTO to >/= 62%     Baseline  28% function    Time  8    Period  Weeks    Status  New            Plan - 09/01/19 1601    Clinical Impression Statement  Initiated gait training with tripod cane due to pt continuing to ambulate around her house touching furniture and feeling unsafe ambulating with a cane in her home.  Utilized cane for step ups, down and weight shift training to continue to focus on LE strengthening, weight shifting and SLS training.  Pt required increased assistance to maintain balance and demonstrated increased LE fatigue.  Will continue to address in order to progress towards LTG.  Personal Factors and Comorbidities  Comorbidity 3+;Finances;Fitness;Transportation    Comorbidities  cancer of R colon with colectomy and chemotherapy, constipation, CAD, CVA, cardiomegaly, HLD, HTN, anemia, NSTEMI, obesity, chronic back pain after back surgery, and a history of motion sickness    Examination-Activity Limitations  Bend;Locomotion Level;Stairs;Stand    Examination-Participation Restrictions  Cleaning;Laundry;Meal Prep    Stability/Clinical Decision Making  Evolving/Moderate complexity    Rehab Potential  Good    PT Frequency  2x / week    PT Duration  8 weeks    PT Treatment/Interventions  ADLs/Self Care Home Management;Canalith Repostioning;Cryotherapy;Moist Heat;DME Instruction;Gait training;Stair training;Functional mobility training;Therapeutic activities;Therapeutic exercise;Balance training;Neuromuscular re-education;Patient/family education;Orthotic Fit/Training;Vestibular    PT Next Visit Plan  Gait training with tripod cane for home.  Gastroc stretch; staggered stance weight shifting, tandem gait to HEP? Endurance on Nustep.  Postural exercises, balance with decreased UE support, weight shifting and SLS activities    PT Home Exercise Plan  [id number]    Consulted and Agree with Plan of Care  Patient       Patient will benefit from skilled therapeutic intervention in order to improve the  following deficits and impairments:  Decreased activity tolerance, Decreased balance, Decreased endurance, Decreased strength, Difficulty walking, Dizziness, Impaired sensation, Postural dysfunction, Pain  Visit Diagnosis: Muscle weakness (generalized)  Unsteadiness on feet  Repeated falls  Other disturbances of skin sensation  Difficulty in walking, not elsewhere classified  Abnormal posture     Problem List Patient Active Problem List   Diagnosis Date Noted  . Generalized weakness 07/05/2019  . Diarrhea 07/05/2019  . Fall at home, initial encounter 07/05/2019  . Port-A-Cath in place 02/23/2019  . Family history of colon cancer   . Personal history of colonic polyps   . History of MI (myocardial infarction) 01/22/2019  . Severe obesity ([place])   . PONV (postoperative nausea and vomiting)   . Iron deficiency anemia   . Hypertension   . Hyperlipidemia   . History of cardiomegaly   . GERD (gastroesophageal reflux disease)   . Cancer of right colon - resected 01/17/2019 01/17/2019  . CAD S/P percutaneous coronary angioplasty 07/10/2016  . Essential hypertension 02/28/2016  . Dyslipidemia, goal LDL below 70 02/28/2016    Rico Junker, PT, DPT 09/01/19    4:07 PM    [facility] [address] [place], [place], [address] Phone: [phone]   Fax:  [phone]  Name: Akesha Uresti MRN: [id number] Date of Birth: 1945/07/05

## 2019-09-07 ENCOUNTER — Ambulatory Visit: Payer: Medicare Other | Attending: Family Medicine | Admitting: Physical Therapy

## 2019-09-07 ENCOUNTER — Other Ambulatory Visit: Payer: Self-pay

## 2019-09-07 ENCOUNTER — Encounter: Payer: Self-pay | Admitting: Physical Therapy

## 2019-09-07 DIAGNOSIS — R262 Difficulty in walking, not elsewhere classified: Secondary | ICD-10-CM | POA: Diagnosis present

## 2019-09-07 DIAGNOSIS — R296 Repeated falls: Secondary | ICD-10-CM

## 2019-09-07 DIAGNOSIS — M6281 Muscle weakness (generalized): Secondary | ICD-10-CM | POA: Insufficient documentation

## 2019-09-07 DIAGNOSIS — R293 Abnormal posture: Secondary | ICD-10-CM | POA: Insufficient documentation

## 2019-09-07 DIAGNOSIS — R209 Unspecified disturbances of skin sensation: Secondary | ICD-10-CM | POA: Insufficient documentation

## 2019-09-07 DIAGNOSIS — R208 Other disturbances of skin sensation: Secondary | ICD-10-CM

## 2019-09-07 DIAGNOSIS — R2681 Unsteadiness on feet: Secondary | ICD-10-CM | POA: Diagnosis present

## 2019-09-07 NOTE — Therapy (Signed)
[facility] [address] [place], [place], [address] Phone: [phone]   Fax:  [phone]  Physical Therapy Treatment  Patient Details  Name: [name] MRN: [id number] Date of Birth: 1945/12/28 Referring Provider (PT): Referred by [facility] physician; certification will be sent to PCP - Chesley Noon, MD   Encounter Date: 09/07/2019  PT End of Session - 09/07/19 1729    Visit Number  9    Number of Visits  17    Date for PT Re-Evaluation  09/17/19    Authorization Type  [employer] $40 copay.  10th visit PN    Progress Note Due on Visit  10    PT Start Time  1449    PT Stop Time  1536    PT Time Calculation (min)  47 min    Activity Tolerance  Patient tolerated treatment well    Behavior During Therapy  WFL for tasks assessed/performed       Past Medical History:  Diagnosis Date  . Cancer of right colon ([place])   . Constipation   . Coronary artery disease   . CVA (cerebral infarction) 1996  . Dyspnea    going up stairs and hills  . Family history of colon cancer   . GERD (gastroesophageal reflux disease)   . History of cardiomegaly   . Hyperlipidemia   . Hypertension   . Iron deficiency anemia   . Myocardial infarction ([place]) 2017  . NSTEMI (non-ST elevated myocardial infarction) ([place])   . Personal history of colonic polyps   . PONV (postoperative nausea and vomiting)   . Right shoulder pain    more painful at night , cold makes it worse  . Severe obesity ([place])     Past Surgical History:  Procedure Laterality Date  . ABDOMINAL HYSTERECTOMY    . BACK SURGERY     x2  . CARDIAC CATHETERIZATION N/A 02/28/2016   Procedure: Left Heart Cath and Coronary Angiography;  Surgeon: Peter M Martinique, MD;  Location: [facility];  Service: Cardiovascular;  Laterality: N/A;  . CARDIAC CATHETERIZATION N/A 02/28/2016   Procedure: Coronary Stent Intervention;  Surgeon: Peter M Martinique, MD;  Location: [facility];  Service: Cardiovascular;  Laterality: N/A;  . CHOLECYSTECTOMY    . COLONOSCOPY  11/24/2018  . CORONARY ANGIOPLASTY    . CYST EXCISION Left    Cheek  . LAPAROSCOPIC RIGHT HEMI COLECTOMY Right 01/17/2019   Procedure: LAPAROSCOPIC RIGHT HEMI COLECTOMY;  Surgeon: Alphonsa Overall, MD;  Location: [facility];  Service: General;  Laterality: Right;  . NASAL FRACTURE SURGERY    . PORTACATH PLACEMENT N/A 02/22/2019   Procedure: POWER PORT PLACEMENT;  Surgeon: Alphonsa Overall, MD;  Location: [facility];  Service: General;  Laterality: N/A;  . UPPER GI ENDOSCOPY  11/24/2018    There were no vitals filed for this visit.  Subjective Assessment - 09/07/19 1453    Subjective  Went to see surgeon this morning - he was pleased with her progress.  Can feel improvement in her ability to stand up from a chair with less effort.  Was able to get out of the car, walk to the garage and up the 5 stairs into the house without RW or cane without any assistance.  Still feels uncomfortable with cane; continues to furniture walk.    Pertinent History  cancer of R colon with colectomy and chemotherapy, constipation, CAD, CVA, cardiomegaly, HLD, HTN, anemia, NSTEMI, obesity, chronic back pain  after back surgery, and a history of motion sickness    Limitations  Standing;Walking;House hold activities    Patient Stated Goals  To get back on her feet and walk without feeling like she is going to fall - started last June after finding out she had colon CA - 18 inches removed + chemo.    Currently in Pain?  No/denies                       [facility] Adult PT Treatment/Exercise - 09/07/19 1502      Ambulation/Gait   Ambulation/Gait  Yes    Ambulation/Gait Assistance  5: Supervision    Ambulation/Gait Assistance Details  with cane with quad tip;     Ambulation Distance (Feet)  230 Feet    Assistive device  Straight cane    Gait Pattern  Step-through pattern;Decreased stride length;Right foot flat;Left foot  flat;Right flexed knee in stance;Left flexed knee in stance;Trunk flexed;Poor foot clearance - left;Poor foot clearance - right    Ambulation Surface  Level;Indoor;Other (comment)   red mat for compliant surface   Stairs  Yes    Stairs Assistance  4: Min assist    Stairs Assistance Details (indicate cue type and reason)  verbal cues for sequencing with cane and one rail; min A when descending to maintain anterior weight shift and prevent posterior LOB; pt performed alternating when ascending    Stair Management Technique  One rail Left;Alternating pattern;Step to pattern;Forwards;With cane    Number of Stairs  8    Height of Stairs  6    Gait Comments  Initiated gait training on red mat for compliant surface - performed gait forwards and backwards x 3 laps with min A and verbal cues for safe sequencing.  When pt was about to begin side stepping with cane pt began to feel very lightheaded and losing balance backwards; therapist supported pt while tech brought chair for pt to sit in.  BP assessed in sitting and then standing with BP and HR elevating in standing.  Pt states that she may have forgotten to take her BP meds this morning because of her husband being sick and there was a lot going on at home.  Pt to make sure she fills her medication box to ensure consistent use of BP medication.  Treatment stopped due to elevated BP.  By end of session BP had decreased to 147/95, HR: 101      Exercises   Exercises  Knee/Hip      Knee/Hip Exercises: Aerobic   Nustep  Seat set at 7; resistance at level 4 with bilat UE and LE x 6 minutes for aerobic endurance and UE/LE strengthening.             PT Education - 09/07/19 1728    Education Details  stair negotiation with cane    Person(s) Educated  Patient    Methods  Explanation;Demonstration;Tactile cues;Verbal cues    Comprehension  Need further instruction       PT Short Term Goals - 08/21/19 1539      PT SHORT TERM GOAL #1   Title  Pt  will participate in full vestibular assessment    Baseline  no significant vestibular findings noted    Time  4    Period  Weeks    Status  Achieved    Target Date  08/18/19      PT SHORT TERM GOAL #2   Title  Pt  will improve LE strength as indicated by decrease in time to perform five time sit to stand to </= 19 seconds with use of UE    Baseline  22 seconds with UE    Time  4    Period  Weeks    Status  Partially Met    Target Date  08/18/19      PT SHORT TERM GOAL #3   Title  Pt will improve gait velocity with LRAD to >/= 1.8 ft/sec with supervision    Baseline  1.64 ft/sec 1.7 ft/sec    Time  4    Period  Weeks    Status  Partially Met    Target Date  08/18/19      PT SHORT TERM GOAL #4   Title  Pt will increase BERG to >/= 42/56 to indicate decreased falls risk    Baseline  40/56    Time  4    Period  Weeks    Status  Partially Met    Target Date  08/18/19      PT SHORT TERM GOAL #5   Title  Pt will demonstrate independence with initial vestibular, balance and LE strengthening HEP    Time  4    Period  Weeks    Status  Achieved    Target Date  08/18/19        PT Long Term Goals - 08/22/19 1728      PT LONG TERM GOAL #1   Title  Pt will be independent with final HEP for vestibular, balance and strengthening  (LTG due by 09/17/2019)    Time  8    Period  Weeks    Status  New      PT LONG TERM GOAL #2   Title  Will report 18 point decrease in [place]    Baseline  84 severe    Time  8    Period  Weeks    Status  New      PT LONG TERM GOAL #3   Title  Pt will increase gait velocity with LRAD to >/= 2.0 ft/sec    Baseline  1.7    Time  8    Period  Weeks    Status  Revised      PT LONG TERM GOAL #4   Title  Pt will increase BERG to >/= 44/56 to indicate decreased risk for falls    Baseline  40/56    Time  8    Period  Weeks    Status  Revised      PT LONG TERM GOAL #5   Title  Pt will decrease time to perform five time sit to stand to </= 18 seconds     Baseline  22 seconds with UE use    Time  8    Period  Weeks    Status  Revised      PT LONG TERM GOAL #6   Title  Pt will report ability to participate in [place] - cooking, laundry, cleaning - with supervision of husband or daughter and will report improvement in function on FOTO to >/= 62%    Baseline  28% function    Time  8    Period  Weeks    Status  New            Plan - 09/07/19 1730    Clinical Impression Statement  Initiated aerobic/endurance training on Nustep and continued gait training with cane  on compliant surfaces and for stair negotiation. Session terminated a few minutes early today due to pt demonstrating symptomatic HTN.  After a prolonged seated rest break pt's BP had lowered and pt was no longer symptomatic; will continue to monitor BP at each session.    Personal Factors and Comorbidities  Comorbidity 3+;Finances;Fitness;Transportation    Comorbidities  cancer of R colon with colectomy and chemotherapy, constipation, CAD, CVA, cardiomegaly, HLD, HTN, anemia, NSTEMI, obesity, chronic back pain after back surgery, and a history of motion sickness    Examination-Activity Limitations  Bend;Locomotion Level;Stairs;Stand    Examination-Participation Restrictions  Cleaning;Laundry;Meal Prep    Stability/Clinical Decision Making  Evolving/Moderate complexity    Rehab Potential  Good    PT Frequency  2x / week    PT Duration  8 weeks    PT Treatment/Interventions  ADLs/Self Care Home Management;Canalith Repostioning;Cryotherapy;Moist Heat;DME Instruction;Gait training;Stair training;Functional mobility training;Therapeutic activities;Therapeutic exercise;Balance training;Neuromuscular re-education;Patient/family education;Orthotic Fit/Training;Vestibular    PT Next Visit Plan  CHECK STG and 10th visit PN; ASSESS BP; Gait training with tripod cane for home, compliant surfaces, around and over obstacles, stairs.  Gastroc stretch; staggered stance weight shifting, tandem  gait to HEP? Endurance on Nustep.  Postural exercises, balance with decreased UE support, weight shifting and SLS activities    PT Home Exercise Plan  [id number]    Consulted and Agree with Plan of Care  Patient       Patient will benefit from skilled therapeutic intervention in order to improve the following deficits and impairments:  Decreased activity tolerance, Decreased balance, Decreased endurance, Decreased strength, Difficulty walking, Dizziness, Impaired sensation, Postural dysfunction, Pain  Visit Diagnosis: Muscle weakness (generalized)  Unsteadiness on feet  Repeated falls  Other disturbances of skin sensation  Difficulty in walking, not elsewhere classified  Abnormal posture     Problem List Patient Active Problem List   Diagnosis Date Noted  . Generalized weakness 07/05/2019  . Diarrhea 07/05/2019  . Fall at home, initial encounter 07/05/2019  . Port-A-Cath in place 02/23/2019  . Family history of colon cancer   . Personal history of colonic polyps   . History of MI (myocardial infarction) 01/22/2019  . Severe obesity ([place])   . PONV (postoperative nausea and vomiting)   . Iron deficiency anemia   . Hypertension   . Hyperlipidemia   . History of cardiomegaly   . GERD (gastroesophageal reflux disease)   . Cancer of right colon - resected 01/17/2019 01/17/2019  . CAD S/P percutaneous coronary angioplasty 07/10/2016  . Essential hypertension 02/28/2016  . Dyslipidemia, goal LDL below 70 02/28/2016    Rico Junker, PT, DPT 09/07/19    5:35 PM    [facility] [address] [place], [place], [address] Phone: [phone]   Fax:  [phone]  Name: [name] MRN: [id number] Date of Birth: Oct 10, 1945

## 2019-09-13 ENCOUNTER — Telehealth: Payer: Self-pay | Admitting: *Deleted

## 2019-09-13 NOTE — Telephone Encounter (Signed)
Patient saw surgeon and wants to delay removal of port due to so many appointments currently and her husband is in the hospital. Last flush was 06/16/19. Informed her of risk to going too long without flush and she still chooses to delay. She agreed to let RN call her in 2 weeks to see if she is ready.

## 2019-09-22 ENCOUNTER — Encounter: Payer: Self-pay | Admitting: Physical Therapy

## 2019-09-22 ENCOUNTER — Other Ambulatory Visit: Payer: Self-pay

## 2019-09-22 ENCOUNTER — Ambulatory Visit: Payer: Medicare Other | Admitting: Physical Therapy

## 2019-09-22 DIAGNOSIS — R293 Abnormal posture: Secondary | ICD-10-CM

## 2019-09-22 DIAGNOSIS — R2681 Unsteadiness on feet: Secondary | ICD-10-CM

## 2019-09-22 DIAGNOSIS — M6281 Muscle weakness (generalized): Secondary | ICD-10-CM | POA: Diagnosis not present

## 2019-09-22 DIAGNOSIS — R208 Other disturbances of skin sensation: Secondary | ICD-10-CM

## 2019-09-22 DIAGNOSIS — R296 Repeated falls: Secondary | ICD-10-CM

## 2019-09-22 DIAGNOSIS — R262 Difficulty in walking, not elsewhere classified: Secondary | ICD-10-CM

## 2019-09-22 NOTE — Therapy (Signed)
[facility] [address] [place], [place], [address] Phone: [phone]   Fax:  [phone]  Physical Therapy Treatment  Patient Details  Name: [name] MRN: [id number] Date of Birth: Oct 14, 1945 Referring Provider (PT): Referred by [facility] physician; certification will be sent to PCP - Chesley Noon, MD   Encounter Date: 09/22/2019   Progress Note  Reporting Period 07/19/2019 to 09/22/19  See note below for Objective Data and Assessment of Progress/Goals.   PT End of Session - 09/22/19 1650    Visit Number  10    Number of Visits  26    Date for PT Re-Evaluation  11/21/19    Authorization Type  [employer] $40 copay.  10th visit PN    Progress Note Due on Visit  20    PT Start Time  1530    PT Stop Time  1615    PT Time Calculation (min)  45 min    Activity Tolerance  Patient tolerated treatment well    Behavior During Therapy  WFL for tasks assessed/performed       Past Medical History:  Diagnosis Date  . Cancer of right colon ([place])   . Constipation   . Coronary artery disease   . CVA (cerebral infarction) 1996  . Dyspnea    going up stairs and hills  . Family history of colon cancer   . GERD (gastroesophageal reflux disease)   . History of cardiomegaly   . Hyperlipidemia   . Hypertension   . Iron deficiency anemia   . Myocardial infarction ([place]) 2017  . NSTEMI (non-ST elevated myocardial infarction) ([place])   . Personal history of colonic polyps   . PONV (postoperative nausea and vomiting)   . Right shoulder pain    more painful at night , cold makes it worse  . Severe obesity ([place])     Past Surgical History:  Procedure Laterality Date  . ABDOMINAL HYSTERECTOMY    . BACK SURGERY     x2  . CARDIAC CATHETERIZATION N/A 02/28/2016   Procedure: Left Heart Cath and Coronary Angiography;  Surgeon: Peter M Martinique, MD;  Location: [facility];  Service: Cardiovascular;  Laterality: N/A;   . CARDIAC CATHETERIZATION N/A 02/28/2016   Procedure: Coronary Stent Intervention;  Surgeon: Peter M Martinique, MD;  Location: [facility];  Service: Cardiovascular;  Laterality: N/A;  . CHOLECYSTECTOMY    . COLONOSCOPY  11/24/2018  . CORONARY ANGIOPLASTY    . CYST EXCISION Left    Cheek  . LAPAROSCOPIC RIGHT HEMI COLECTOMY Right 01/17/2019   Procedure: LAPAROSCOPIC RIGHT HEMI COLECTOMY;  Surgeon: Alphonsa Overall, MD;  Location: [facility];  Service: General;  Laterality: Right;  . NASAL FRACTURE SURGERY    . PORTACATH PLACEMENT N/A 02/22/2019   Procedure: POWER PORT PLACEMENT;  Surgeon: Alphonsa Overall, MD;  Location: [facility];  Service: General;  Laterality: N/A;  . UPPER GI ENDOSCOPY  11/24/2018    There were no vitals filed for this visit.  Subjective Assessment - 09/22/19 1538    Subjective  Came early today; has been confused this week.  Husband had multiple falls and had to be admitted to the hospital for kidney stent and then for a heart attack.  Had another fall, was reaching down the floor and fell forwards on to her knees.  Was able to get up from the floor with her rollator.    Pertinent History  cancer of R colon with colectomy and  chemotherapy, constipation, CAD, CVA, cardiomegaly, HLD, HTN, anemia, NSTEMI, obesity, chronic back pain after back surgery, and a history of motion sickness    Limitations  Standing;Walking;House hold activities    Patient Stated Goals  To get back on her feet and walk without feeling like she is going to fall - started last June after finding out she had colon CA - 18 inches removed + chemo.    Currently in Pain?  No/denies         [facility] PT Assessment - 09/22/19 1541      Assessment   Medical Diagnosis  Dizziness and deconditioning after Colon cancer/chemo    Referring Provider (PT)  Referred by [facility] physician; certification will be sent to PCP - Chesley Noon, MD    Onset Date/Surgical Date  07/07/19    Prior Therapy  yes      Precautions    Precautions  Other (comment)    Precaution Comments  cancer of R colon with colectomy and chemotherapy, constipation, CAD, CVA, cardiomegaly, HLD, HTN, anemia, NSTEMI, obesity, chronic back pain after back surgery, and a history of motion sickness      Balance Screen   Has the patient fallen in the past 6 months  Yes    How many times?  --   had another fall forwards when reaching to the floor     Prior Function   Level of Independence  Independent      Observation/Other Assessments   Focus on Therapeutic Outcomes (FOTO)   Reports 58% function (improved from 28%)    Other Surveys   Dizziness Handicap Inventory (DHI)    Dizziness Handicap Inventory (DHI)   62%      Standardized Balance Assessment   Standardized Balance Assessment  Berg Balance Test;Five Times Sit to Stand;10 meter walk test    Five times sit to stand comments   20 seconds without use of UE    10 Meter Walk  13.5 seconds with Rollator or 2.42 ft/sec; without AD pt performed in 12.65 seconds or 2.59 ft/sec       Berg Balance Test   Sit to Stand  Able to stand without using hands and stabilize independently    Standing Unsupported  Able to stand safely 2 minutes    Sitting with Back Unsupported but Feet Supported on Floor or Stool  Able to sit safely and securely 2 minutes    Stand to Sit  Sits safely with minimal use of hands    Transfers  Able to transfer safely, minor use of hands    Standing Unsupported with Eyes Closed  Able to stand 10 seconds safely    Standing Unsupported with Feet Together  Able to place feet together independently and stand 1 minute safely    From Standing, Reach Forward with Outstretched Arm  Can reach forward >12 cm safely (5")    From Standing Position, Pick up Object from Floor  Able to pick up shoe safely and easily    From Standing Position, Turn to Look Behind Over each Shoulder  Looks behind one side only/other side shows less weight shift    Turn 360 Degrees  Able to turn 360 degrees  safely but slowly    Standing Unsupported, Alternately Place Feet on Step/Stool  Able to complete 4 steps without aid or supervision    Standing Unsupported, One Foot in Front  Able to plae foot ahead of the other independently and hold 30 seconds    Standing  on One Leg  Tries to lift leg/unable to hold 3 seconds but remains standing independently    Total Score  46    Berg comment:  46/56 moderate falls risk                    [facility] Adult PT Treatment/Exercise - 09/22/19 1645      Transfers   Transfers  Sit to Stand;Stand to [employer] Transfers    Sit to Stand  6: Modified independent (Device/Increase time)    Stand to Sit  6: Modified independent (Device/Increase time)    Stand Pivot Transfers  6: Modified independent (Device/Increase time)      Ambulation/Gait   Ambulation/Gait  Yes    Ambulation/Gait Assistance  5: Supervision    Ambulation/Gait Assistance Details  MOD I with rollator; supervision without rollator    Ambulation Distance (Feet)  100 Feet    Assistive device  None    Gait Pattern  Step-through pattern;Decreased stride length;Trunk flexed;Poor foot clearance - left;Poor foot clearance - right;Decreased dorsiflexion - right;Decreased dorsiflexion - left;Shuffle    Ambulation Surface  Level;Indoor             PT Education - 09/22/19 1648    Education Details  progress towards goals; areas to continue to focus on for remainder of visits    Person(s) Educated  Patient    Methods  Explanation    Comprehension  Verbalized understanding       PT Short Term Goals - 08/21/19 1539      PT SHORT TERM GOAL #1   Title  Pt will participate in full vestibular assessment    Baseline  no significant vestibular findings noted    Time  4    Period  Weeks    Status  Achieved    Target Date  08/18/19      PT SHORT TERM GOAL #2   Title  Pt will improve LE strength as indicated by decrease in time to perform five time sit to stand to </= 19 seconds  with use of UE    Baseline  22 seconds with UE    Time  4    Period  Weeks    Status  Partially Met    Target Date  08/18/19      PT SHORT TERM GOAL #3   Title  Pt will improve gait velocity with LRAD to >/= 1.8 ft/sec with supervision    Baseline  1.64 ft/sec 1.7 ft/sec    Time  4    Period  Weeks    Status  Partially Met    Target Date  08/18/19      PT SHORT TERM GOAL #4   Title  Pt will increase BERG to >/= 42/56 to indicate decreased falls risk    Baseline  40/56    Time  4    Period  Weeks    Status  Partially Met    Target Date  08/18/19      PT SHORT TERM GOAL #5   Title  Pt will demonstrate independence with initial vestibular, balance and LE strengthening HEP    Time  4    Period  Weeks    Status  Achieved    Target Date  08/18/19        PT Long Term Goals - 09/22/19 1540      PT LONG TERM GOAL #1   Title  Pt will be independent with final HEP  for vestibular, balance and strengthening  (LTG due by 09/17/2019)    Time  8    Period  Weeks    Status  Achieved      PT LONG TERM GOAL #2   Title  Will report 18 point decrease in [place]    Baseline  84 severe > 62 (22 point decrease)    Time  8    Period  Weeks    Status  Achieved      PT LONG TERM GOAL #3   Title  Pt will increase gait velocity with LRAD to >/= 2.0 ft/sec    Baseline  1.7 > 2.5 ft/sec    Time  8    Period  Weeks    Status  Achieved      PT LONG TERM GOAL #4   Title  Pt will increase BERG to >/= 44/56 to indicate decreased risk for falls    Baseline  40/56 > 46/56    Time  8    Period  Weeks    Status  Achieved      PT LONG TERM GOAL #5   Title  Pt will decrease time to perform five time sit to stand to </= 18 seconds    Baseline  22 seconds with UE use; 20 seconds but without UE use    Time  8    Period  Weeks    Status  Partially Met      PT LONG TERM GOAL #6   Title  Pt will report ability to participate in MRADL - cooking, laundry, cleaning - with supervision of husband or  daughter and will report improvement in function on FOTO to >/= 62%    Baseline  28% function > 58% function, is doing laundry but not cooking or cleaning    Time  8    Period  Weeks    Status  Partially Met        New goals for recertification: PT Short Term Goals - 09/22/19 1708      PT SHORT TERM GOAL #1   Title  Pt will demonstrate independence with ongoing HEP    Time  4    Period  Weeks    Status  Revised    Target Date  10/22/19      PT SHORT TERM GOAL #2   Title  Pt will improve LE strength as indicated by decrease in time to perform five time sit to stand to </= 18 seconds without use of UE    Baseline  20 without use of UE    Time  4    Period  Weeks    Status  Revised    Target Date  10/22/19      PT SHORT TERM GOAL #3   Title  Pt will improve gait velocity with LRAD to >/= 2.8 ft/sec without AD    Baseline  2.6 without AD    Time  4    Period  Weeks    Status  Revised    Target Date  10/22/19      PT SHORT TERM GOAL #4   Title  Pt will increase BERG to >/= 50/56 to indicate decreased falls risk    Baseline  46/56    Time  4    Period  Weeks    Status  Revised    Target Date  10/22/19      PT SHORT TERM GOAL #5   Title  Pt will demonstrate ability to reach down to the ground multiple times with supervision without LOB    Time  4    Period  Weeks    Status  Revised    Target Date  10/22/19      PT Long Term Goals - 09/22/19 1715      PT LONG TERM GOAL #1   Title  Pt will be independent with final HEP for vestibular, balance and strengthening    Time  8    Period  Weeks    Status  Revised    Target Date  11/21/19      PT LONG TERM GOAL #2   Title  Will report 10 point further decrease in [facility]    Baseline  84 severe > 62 (22 point decrease)    Time  8    Period  Weeks    Status  Revised    Target Date  11/21/19      PT LONG TERM GOAL #3   Title  Pt will increase gait velocity without AD to 3.0 ft/sec    Baseline  1.7 > 2.5 ft/sec    Time   8    Period  Weeks    Status  Revised    Target Date  11/21/19      PT LONG TERM GOAL #4   Title  Pt will increase BERG to >/= 52/56 to indicate decreased risk for falls    Time  8    Period  Weeks    Status  Revised    Target Date  11/21/19      PT LONG TERM GOAL #5   Title  Pt will decrease time to perform five time sit to stand to </= 16 seconds without use of UE    Baseline  22 seconds with UE use; 20 seconds but without UE use    Time  8    Period  Weeks    Status  Revised    Target Date  11/21/19      PT LONG TERM GOAL #6   Title  Pt will report ability to participate in MRADL - cooking, laundry, cleaning - with supervision of husband or daughter and will report improvement in function on FOTO to >/= 62%    Baseline  28% function > 58% function, is doing laundry but not cooking or cleaning    Time  8    Period  Weeks    Status  On-going    Target Date  11/21/19         Plan - 09/22/19 1652    Clinical Impression Statement  Performed assessment of patient's progress towards LTG.  Pt is making good progress and has met 4/6 LTG.  Pt demonstrates increased gait velocity with rollator and was able to perform gait velocity assessment today without any AD.  Pt demonstrates decreased falls risk as indicated by increase in BERG score.  She also demonstrates improved LE strength and balance for transfers as indicated by five time sit to stand improvement.  Pt continues to be at increased falls risk, especially when leaning/bending forwards and continues to present with limited standing balance and standing endurance to be able to return to normal MRADL.  Pt will benefit from continued skilled PT services to address these impairments to maximize functional mobility independence and decrease falls risk.    Personal Factors and Comorbidities  Comorbidity 3+;Finances;Fitness;Transportation    Comorbidities  cancer of R colon with colectomy  and chemotherapy, constipation, CAD, CVA,  cardiomegaly, HLD, HTN, anemia, NSTEMI, obesity, chronic back pain after back surgery, and a history of motion sickness    Examination-Activity Limitations  Bend;Locomotion Level;Stairs;Stand    Examination-Participation Restrictions  Cleaning;Laundry;Meal Prep    Stability/Clinical Decision Making  Evolving/Moderate complexity    Rehab Potential  Good    PT Frequency  2x / week    PT Duration  8 weeks    PT Treatment/Interventions  ADLs/Self Care Home Management;Canalith Repostioning;Cryotherapy;Moist Heat;DME Instruction;Gait training;Stair training;Functional mobility training;Therapeutic activities;Therapeutic exercise;Balance training;Neuromuscular re-education;Patient/family education;Orthotic Fit/Training;Vestibular    PT Next Visit Plan  ASSESS BP; Higher level gait training without AD, compliant surfaces, around and over obstacles, stairs.  Gastroc stretch; staggered stance weight shifting, tandem gait to HEP? Endurance on Nustep.  Postural exercises, balance with decreased UE support, weight shifting and SLS activities    PT Home Exercise Plan  [id number]    Consulted and Agree with Plan of Care  Patient       Patient will benefit from skilled therapeutic intervention in order to improve the following deficits and impairments:  Decreased activity tolerance, Decreased balance, Decreased endurance, Decreased strength, Difficulty walking, Dizziness, Impaired sensation, Postural dysfunction, Pain  Visit Diagnosis: Muscle weakness (generalized)  Unsteadiness on feet  Repeated falls  Other disturbances of skin sensation  Difficulty in walking, not elsewhere classified  Abnormal posture     Problem List Patient Active Problem List   Diagnosis Date Noted  . Generalized weakness 07/05/2019  . Diarrhea 07/05/2019  . Fall at home, initial encounter 07/05/2019  . Port-A-Cath in place 02/23/2019  . Family history of colon cancer   . Personal history of colonic polyps   . History  of MI (myocardial infarction) 01/22/2019  . Severe obesity ([place])   . PONV (postoperative nausea and vomiting)   . Iron deficiency anemia   . Hypertension   . Hyperlipidemia   . History of cardiomegaly   . GERD (gastroesophageal reflux disease)   . Cancer of right colon - resected 01/17/2019 01/17/2019  . CAD S/P percutaneous coronary angioplasty 07/10/2016  . Essential hypertension 02/28/2016  . Dyslipidemia, goal LDL below 70 02/28/2016    Rico Junker, PT, DPT 09/22/19    5:08 PM    [facility] [address] [place], [place], [address] Phone: [phone]   Fax:  [phone]  Name: [name] MRN: [id number] Date of Birth: 30-Nov-1945

## 2019-09-26 ENCOUNTER — Telehealth: Payer: Self-pay | Admitting: *Deleted

## 2019-09-26 NOTE — Telephone Encounter (Signed)
Called patient to schedule her past due port flush. She reports that her husband has had stents placed and can't drive yet and she does not drive. He has an appointment early May near the cancer center. When she finds out date/time she will call to make the flush appointment.

## 2019-09-29 ENCOUNTER — Other Ambulatory Visit: Payer: Self-pay

## 2019-09-29 ENCOUNTER — Ambulatory Visit: Payer: Medicare Other | Admitting: Physical Therapy

## 2019-09-29 ENCOUNTER — Encounter: Payer: Self-pay | Admitting: Physical Therapy

## 2019-09-29 VITALS — BP 120/85

## 2019-09-29 DIAGNOSIS — R208 Other disturbances of skin sensation: Secondary | ICD-10-CM

## 2019-09-29 DIAGNOSIS — R293 Abnormal posture: Secondary | ICD-10-CM

## 2019-09-29 DIAGNOSIS — R296 Repeated falls: Secondary | ICD-10-CM

## 2019-09-29 DIAGNOSIS — R262 Difficulty in walking, not elsewhere classified: Secondary | ICD-10-CM

## 2019-09-29 DIAGNOSIS — M6281 Muscle weakness (generalized): Secondary | ICD-10-CM

## 2019-09-29 DIAGNOSIS — R2681 Unsteadiness on feet: Secondary | ICD-10-CM

## 2019-09-29 NOTE — Therapy (Signed)
[facility] [address] [place], [place], [address] Phone: [phone]   Fax:  [phone]  Physical Therapy Treatment  Patient Details  Name: [name] MRN: [id number] Date of Birth: [birth year] Referring Provider (PT): Referred by [facility] physician; certification will be sent to PCP - Chesley Noon, MD   Encounter Date: 09/29/2019  PT End of Session - 09/29/19 1601    Visit Number  11    Number of Visits  26    Date for PT Re-Evaluation  11/21/19    Authorization Type  [employer] $40 copay.  10th visit PN    Progress Note Due on Visit  20    PT Start Time  1455    PT Stop Time  1540    PT Time Calculation (min)  45 min    Activity Tolerance  Patient tolerated treatment well    Behavior During Therapy  WFL for tasks assessed/performed       Past Medical History:  Diagnosis Date  . Cancer of right colon ([place])   . Constipation   . Coronary artery disease   . CVA (cerebral infarction) 1996  . Dyspnea    going up stairs and hills  . Family history of colon cancer   . GERD (gastroesophageal reflux disease)   . History of cardiomegaly   . Hyperlipidemia   . Hypertension   . Iron deficiency anemia   . Myocardial infarction ([place]) 2017  . NSTEMI (non-ST elevated myocardial infarction) ([place])   . Personal history of colonic polyps   . PONV (postoperative nausea and vomiting)   . Right shoulder pain    more painful at night , cold makes it worse  . Severe obesity ([place])     Past Surgical History:  Procedure Laterality Date  . ABDOMINAL HYSTERECTOMY    . BACK SURGERY     x2  . CARDIAC CATHETERIZATION N/A 02/28/2016   Procedure: Left Heart Cath and Coronary Angiography;  Surgeon: Peter M Martinique, MD;  Location: [facility];  Service: Cardiovascular;  Laterality: N/A;  . CARDIAC CATHETERIZATION N/A 02/28/2016   Procedure: Coronary Stent Intervention;  Surgeon: Peter M Martinique, MD;  Location: [facility];  Service: Cardiovascular;  Laterality: N/A;  . CHOLECYSTECTOMY    . COLONOSCOPY  11/24/2018  . CORONARY ANGIOPLASTY    . CYST EXCISION Left    Cheek  . LAPAROSCOPIC RIGHT HEMI COLECTOMY Right 01/17/2019   Procedure: LAPAROSCOPIC RIGHT HEMI COLECTOMY;  Surgeon: Alphonsa Overall, MD;  Location: [facility];  Service: General;  Laterality: Right;  . NASAL FRACTURE SURGERY    . PORTACATH PLACEMENT N/A 02/22/2019   Procedure: POWER PORT PLACEMENT;  Surgeon: Alphonsa Overall, MD;  Location: [facility];  Service: General;  Laterality: N/A;  . UPPER GI ENDOSCOPY  11/24/2018    Vitals:   09/29/19 1507  BP: 120/85    Subjective Assessment - 09/29/19 1504    Subjective  "Everything feels like an effort!"  Pt reports having low energy today.  Husband is doing better.    Pertinent History  cancer of R colon with colectomy and chemotherapy, constipation, CAD, CVA, cardiomegaly, HLD, HTN, anemia, NSTEMI, obesity, chronic back pain after back surgery, and a history of motion sickness    Limitations  Standing;Walking;House hold activities    Patient Stated Goals  To get back on her feet and walk without feeling like she is going to fall - started last June after finding out she  had colon CA - 18 inches removed + chemo.    Currently in Pain?  No/denies                       [facility] Adult PT Treatment/Exercise - 09/29/19 1508      Ambulation/Gait   Ambulation/Gait  Yes    Ambulation/Gait Assistance  4: Min assist    Ambulation/Gait Assistance Details  performed gait over level surface without AD and then through obstacles: weaving L and R around obstacles, across compliant surface, stepping over low obstacles - required min A when stepping over obstacles due to LOB; pt noted to stay very close to objects on R.  Performed obstacle course x 4 reps    Ambulation Distance (Feet)  150 Feet    Assistive device  None    Gait Pattern  Step-through pattern;Decreased stride length;Trunk  flexed;Narrow base of support;Poor foot clearance - left;Poor foot clearance - right    Ambulation Surface  Level;Indoor;Other (comment)   compliant mat         Balance Exercises - 09/29/19 1533      Balance Exercises: Standing   Standing Eyes Opened  Narrow base of support (BOS);Wide (BOA);Foam/compliant surface;Other reps (comment)   10 reps head turns, nods   Other Standing Exercises Comments  Standing on compliant blue mat performed alternating LE taps to cone in front and then cone wide, 10 times each foot.  Then performed double taps - one to center cone, one to wide cone alternating LE x 10 reps; all with min-mod A for balance due to intermittent posterior LOB        PT Education - 09/29/19 1556    Education Details  encouraged pt with progress she has made and validated fatigue after multiple nights of poor sleep and attending to husband's medical needs    Person(s) Educated  Patient    Methods  Explanation    Comprehension  Verbalized understanding       PT Short Term Goals - 09/22/19 1708      PT SHORT TERM GOAL #1   Title  Pt will demonstrate independence with ongoing HEP    Time  4    Period  Weeks    Status  Revised    Target Date  10/22/19      PT SHORT TERM GOAL #2   Title  Pt will improve LE strength as indicated by decrease in time to perform five time sit to stand to </= 18 seconds without use of UE    Baseline  20 without use of UE    Time  4    Period  Weeks    Status  Revised    Target Date  10/22/19      PT SHORT TERM GOAL #3   Title  Pt will improve gait velocity with LRAD to >/= 2.8 ft/sec without AD    Baseline  2.6 without AD    Time  4    Period  Weeks    Status  Revised    Target Date  10/22/19      PT SHORT TERM GOAL #4   Title  Pt will increase BERG to >/= 50/56 to indicate decreased falls risk    Baseline  46/56    Time  4    Period  Weeks    Status  Revised    Target Date  10/22/19      PT SHORT TERM GOAL #5  Title  Pt will  demonstrate ability to reach down to the ground multiple times with supervision without LOB    Time  4    Period  Weeks    Status  Revised    Target Date  10/22/19        PT Long Term Goals - 09/22/19 1715      PT LONG TERM GOAL #1   Title  Pt will be independent with final HEP for vestibular, balance and strengthening    Time  8    Period  Weeks    Status  Revised    Target Date  11/21/19      PT LONG TERM GOAL #2   Title  Will report 10 point further decrease in [place]    Baseline  84 severe > 62 (22 point decrease)    Time  8    Period  Weeks    Status  Revised    Target Date  11/21/19      PT LONG TERM GOAL #3   Title  Pt will increase gait velocity without AD to 3.0 ft/sec    Baseline  1.7 > 2.5 ft/sec    Time  8    Period  Weeks    Status  Revised    Target Date  11/21/19      PT LONG TERM GOAL #4   Title  Pt will increase BERG to >/= 52/56 to indicate decreased risk for falls    Time  8    Period  Weeks    Status  Revised    Target Date  11/21/19      PT LONG TERM GOAL #5   Title  Pt will decrease time to perform five time sit to stand to </= 16 seconds without use of UE    Baseline  22 seconds with UE use; 20 seconds but without UE use    Time  8    Period  Weeks    Status  Revised    Target Date  11/21/19      PT LONG TERM GOAL #6   Title  Pt will report ability to participate in MRADL - cooking, laundry, cleaning - with supervision of husband or daughter and will report improvement in function on FOTO to >/= 62%    Baseline  28% function > 58% function, is doing laundry but not cooking or cleaning    Time  8    Period  Weeks    Status  On-going    Target Date  11/21/19            Plan - 09/29/19 1601    Clinical Impression Statement  Pt reporting increased fatigue today but able to participate in higher level gait training without AD; required min-mod assist for balance when on compliant surfaces or negotiating over obstacles.  Continued balance  and balance reaction training on compliant surface focusing on use of hip strategy.  Pt required intermittent sitting rest breaks but overall tolerated well. Will continue to address in order to progress towards LTG.    Personal Factors and Comorbidities  Comorbidity 3+;Finances;Fitness;Transportation    Comorbidities  cancer of R colon with colectomy and chemotherapy, constipation, CAD, CVA, cardiomegaly, HLD, HTN, anemia, NSTEMI, obesity, chronic back pain after back surgery, and a history of motion sickness    Examination-Activity Limitations  Bend;Locomotion Level;Stairs;Stand    Examination-Participation Restrictions  Cleaning;Laundry;Meal Prep    Stability/Clinical Decision Making  Evolving/Moderate complexity  Rehab Potential  Good    PT Frequency  2x / week    PT Duration  8 weeks    PT Treatment/Interventions  ADLs/Self Care Home Management;Canalith Repostioning;Cryotherapy;Moist Heat;DME Instruction;Gait training;Stair training;Functional mobility training;Therapeutic activities;Therapeutic exercise;Balance training;Neuromuscular re-education;Patient/family education;Orthotic Fit/Training;Vestibular    PT Next Visit Plan  ASSESS BP; Higher level gait training without AD, compliant surfaces, around and over obstacles, stairs.  Gastroc stretch; staggered stance weight shifting, tandem gait to HEP? Endurance on Nustep.  Postural exercises, balance with decreased UE support, weight shifting and SLS activities    PT Home Exercise Plan  [id number]    Consulted and Agree with Plan of Care  Patient       Patient will benefit from skilled therapeutic intervention in order to improve the following deficits and impairments:  Decreased activity tolerance, Decreased balance, Decreased endurance, Decreased strength, Difficulty walking, Dizziness, Impaired sensation, Postural dysfunction, Pain  Visit Diagnosis: Unsteadiness on feet  Muscle weakness (generalized)  Repeated falls  Other  disturbances of skin sensation  Difficulty in walking, not elsewhere classified  Abnormal posture     Problem List Patient Active Problem List   Diagnosis Date Noted  . Generalized weakness 07/05/2019  . Diarrhea 07/05/2019  . Fall at home, initial encounter 07/05/2019  . Port-A-Cath in place 02/23/2019  . Family history of colon cancer   . Personal history of colonic polyps   . History of MI (myocardial infarction) 01/22/2019  . Severe obesity ([place])   . PONV (postoperative nausea and vomiting)   . Iron deficiency anemia   . Hypertension   . Hyperlipidemia   . History of cardiomegaly   . GERD (gastroesophageal reflux disease)   . Cancer of right colon - resected 01/17/2019 01/17/2019  . CAD S/P percutaneous coronary angioplasty 07/10/2016  . Essential hypertension 02/28/2016  . Dyslipidemia, goal LDL below 70 02/28/2016    Rico Junker, PT, DPT 09/29/19    4:11 PM    [facility] [address] [place], [place], [address] Phone: [phone]   Fax:  [phone]  Name: [name] MRN: [id number] Date of Birth: [birth year]

## 2019-10-03 ENCOUNTER — Telehealth: Payer: Self-pay | Admitting: *Deleted

## 2019-10-03 NOTE — Telephone Encounter (Signed)
Called to request port flush appointment. Scheduling message sent

## 2019-10-04 ENCOUNTER — Telehealth: Payer: Self-pay | Admitting: *Deleted

## 2019-10-04 NOTE — Telephone Encounter (Signed)
Patient had requested RN call regarding the procedure tomorrow for port flush. Called her back and left VM to return call.

## 2019-10-05 ENCOUNTER — Other Ambulatory Visit: Payer: Self-pay

## 2019-10-05 ENCOUNTER — Inpatient Hospital Stay: Payer: Medicare Other | Attending: Oncology

## 2019-10-05 DIAGNOSIS — Z85038 Personal history of other malignant neoplasm of large intestine: Secondary | ICD-10-CM | POA: Insufficient documentation

## 2019-10-05 DIAGNOSIS — C182 Malignant neoplasm of ascending colon: Secondary | ICD-10-CM

## 2019-10-05 DIAGNOSIS — Z452 Encounter for adjustment and management of vascular access device: Secondary | ICD-10-CM | POA: Diagnosis present

## 2019-10-05 DIAGNOSIS — Z95828 Presence of other vascular implants and grafts: Secondary | ICD-10-CM

## 2019-10-05 MED ORDER — HEPARIN SOD (PORK) LOCK FLUSH 100 UNIT/ML IV SOLN
500.0000 [IU] | Freq: Once | INTRAVENOUS | Status: AC
  Administered 2019-10-05: 500 [IU]
  Filled 2019-10-05: qty 5

## 2019-10-05 MED ORDER — SODIUM CHLORIDE 0.9% FLUSH
10.0000 mL | Freq: Once | INTRAVENOUS | Status: AC
  Administered 2019-10-05: 10 mL
  Filled 2019-10-05: qty 10

## 2019-10-05 NOTE — Patient Instructions (Signed)

## 2019-10-06 ENCOUNTER — Ambulatory Visit: Payer: Medicare Other | Admitting: Physical Therapy

## 2019-10-06 ENCOUNTER — Encounter: Payer: Self-pay | Admitting: Physical Therapy

## 2019-10-06 VITALS — BP 119/83

## 2019-10-06 DIAGNOSIS — M6281 Muscle weakness (generalized): Secondary | ICD-10-CM

## 2019-10-06 DIAGNOSIS — R296 Repeated falls: Secondary | ICD-10-CM

## 2019-10-06 DIAGNOSIS — R2681 Unsteadiness on feet: Secondary | ICD-10-CM

## 2019-10-06 DIAGNOSIS — R293 Abnormal posture: Secondary | ICD-10-CM

## 2019-10-06 DIAGNOSIS — R262 Difficulty in walking, not elsewhere classified: Secondary | ICD-10-CM

## 2019-10-06 DIAGNOSIS — R208 Other disturbances of skin sensation: Secondary | ICD-10-CM

## 2019-10-06 NOTE — Therapy (Signed)
[facility] [address] [place], [place], [address] Phone: [phone]   Fax:  [phone]  Physical Therapy Treatment  Patient Details  Name: [name] MRN: [id number] Date of Birth: 02-07-1946 Referring Provider (PT): Referred by [facility] physician; certification will be sent to PCP - Chesley Noon, MD   Encounter Date: 10/06/2019  PT End of Session - 10/06/19 1450    Visit Number  12    Number of Visits  26    Date for PT Re-Evaluation  11/21/19    Authorization Type  [employer] $40 copay.  10th visit PN    Progress Note Due on Visit  20    PT Start Time  1447    PT Stop Time  1530    PT Time Calculation (min)  43 min    Equipment Utilized During Treatment  Gait belt    Activity Tolerance  Patient tolerated treatment well    Behavior During Therapy  WFL for tasks assessed/performed       Past Medical History:  Diagnosis Date  . Cancer of right colon ([place])   . Constipation   . Coronary artery disease   . CVA (cerebral infarction) 1996  . Dyspnea    going up stairs and hills  . Family history of colon cancer   . GERD (gastroesophageal reflux disease)   . History of cardiomegaly   . Hyperlipidemia   . Hypertension   . Iron deficiency anemia   . Myocardial infarction ([place]) 2017  . NSTEMI (non-ST elevated myocardial infarction) ([place])   . Personal history of colonic polyps   . PONV (postoperative nausea and vomiting)   . Right shoulder pain    more painful at night , cold makes it worse  . Severe obesity ([place])     Past Surgical History:  Procedure Laterality Date  . ABDOMINAL HYSTERECTOMY    . BACK SURGERY     x2  . CARDIAC CATHETERIZATION N/A 02/28/2016   Procedure: Left Heart Cath and Coronary Angiography;  Surgeon: Peter M Martinique, MD;  Location: [facility];  Service: Cardiovascular;  Laterality: N/A;  . CARDIAC CATHETERIZATION N/A 02/28/2016   Procedure: Coronary Stent  Intervention;  Surgeon: Peter M Martinique, MD;  Location: [facility];  Service: Cardiovascular;  Laterality: N/A;  . CHOLECYSTECTOMY    . COLONOSCOPY  11/24/2018  . CORONARY ANGIOPLASTY    . CYST EXCISION Left    Cheek  . LAPAROSCOPIC RIGHT HEMI COLECTOMY Right 01/17/2019   Procedure: LAPAROSCOPIC RIGHT HEMI COLECTOMY;  Surgeon: Alphonsa Overall, MD;  Location: [facility];  Service: General;  Laterality: Right;  . NASAL FRACTURE SURGERY    . PORTACATH PLACEMENT N/A 02/22/2019   Procedure: POWER PORT PLACEMENT;  Surgeon: Alphonsa Overall, MD;  Location: [facility];  Service: General;  Laterality: N/A;  . UPPER GI ENDOSCOPY  11/24/2018    Vitals:   10/06/19 1452  BP: 119/83    Subjective Assessment - 10/06/19 1449    Subjective  No new complaints. No falls or pain to report. "I stay tired all the time though". HEP is going well on days she has energy to do them.    Pertinent History  cancer of R colon with colectomy and chemotherapy, constipation, CAD, CVA, cardiomegaly, HLD, HTN, anemia, NSTEMI, obesity, chronic back pain after back surgery, and a history of motion sickness    Limitations  Standing;Walking;House hold activities    Patient Stated Goals  To get back  on her feet and walk without feeling like she is going to fall - started last June after finding out she had colon CA - 18 inches removed + chemo.    Currently in Pain?  No/denies          [facility] Adult PT Treatment/Exercise - 10/06/19 1455      Transfers   Transfers  Sit to Stand;Stand to Sit    Sit to Stand  6: Modified independent (Device/Increase time)    Stand to Sit  6: Modified independent (Device/Increase time)      Ambulation/Gait   Ambulation/Gait  Yes    Ambulation/Gait Assistance  4: Min guard    Ambulation/Gait Assistance Details  use of rollator to enter/exit gym. gait around track no AD working on increased gait speed, then adding in scanning all direcions randomly with min gaurd assit.     Ambulation Distance  (Feet)  230 Feet   x1, plus around gym with session   Assistive device  None    Gait Pattern  Step-through pattern;Decreased stride length;Trunk flexed;Narrow base of support;Poor foot clearance - left;Poor foot clearance - right    Ambulation Surface  Level;Indoor      High Level Balance   High Level Balance Activities  Side stepping;Marching forwards;Backward walking    High Level Balance Comments  on red/blue mats next to counter with UE support-  side stepping left<>right for 3 laps each way with no to light touch to counter with cues on form/step length for bigger step; then forward marching/backward walking for 3 laps each way with counter/HHA for balance, cues for increased march height and increased step length with backward walking. min guard to min assist.                       Balance Exercises - 10/06/19 1511      Balance Exercises: Standing   Standing Eyes Opened  Narrow base of support (BOS);Wide (BOA);Head turns;Foam/compliant surface;Other reps (comment);20 secs;Limitations    Standing Eyes Opened Limitations  on airex with no UE support: feet together for EC no head movements, progressing to feet hip width apart for EC head movements left<>right, up<>down. cues on posture/weight shifting. pt with a posterior balance loss preference.     SLS with Vectors  Foam/compliant surface;Upper extremity assist 1;Other reps (comment);Limitations    SLS with Vectors Limitations  2 tall cones on blue mat with HHA: alternating fwd foot taps, then alternating cross foot taps for ~10 reps each. cues on stance position, weight shifting and hip/knee flexion.           PT Short Term Goals - 09/22/19 1708      PT SHORT TERM GOAL #1   Title  Pt will demonstrate independence with ongoing HEP    Time  4    Period  Weeks    Status  Revised    Target Date  10/22/19      PT SHORT TERM GOAL #2   Title  Pt will improve LE strength as indicated by decrease in time to perform five time sit to  stand to </= 18 seconds without use of UE    Baseline  20 without use of UE    Time  4    Period  Weeks    Status  Revised    Target Date  10/22/19      PT SHORT TERM GOAL #3   Title  Pt will improve gait velocity with LRAD  to >/= 2.8 ft/sec without AD    Baseline  2.6 without AD    Time  4    Period  Weeks    Status  Revised    Target Date  10/22/19      PT SHORT TERM GOAL #4   Title  Pt will increase BERG to >/= 50/56 to indicate decreased falls risk    Baseline  46/56    Time  4    Period  Weeks    Status  Revised    Target Date  10/22/19      PT SHORT TERM GOAL #5   Title  Pt will demonstrate ability to reach down to the ground multiple times with supervision without LOB    Time  4    Period  Weeks    Status  Revised    Target Date  10/22/19        PT Long Term Goals - 09/22/19 1715      PT LONG TERM GOAL #1   Title  Pt will be independent with final HEP for vestibular, balance and strengthening    Time  8    Period  Weeks    Status  Revised    Target Date  11/21/19      PT LONG TERM GOAL #2   Title  Will report 10 point further decrease in [place]    Baseline  84 severe > 62 (22 point decrease)    Time  8    Period  Weeks    Status  Revised    Target Date  11/21/19      PT LONG TERM GOAL #3   Title  Pt will increase gait velocity without AD to 3.0 ft/sec    Baseline  1.7 > 2.5 ft/sec    Time  8    Period  Weeks    Status  Revised    Target Date  11/21/19      PT LONG TERM GOAL #4   Title  Pt will increase BERG to >/= 52/56 to indicate decreased risk for falls    Time  8    Period  Weeks    Status  Revised    Target Date  11/21/19      PT LONG TERM GOAL #5   Title  Pt will decrease time to perform five time sit to stand to </= 16 seconds without use of UE    Baseline  22 seconds with UE use; 20 seconds but without UE use    Time  8    Period  Weeks    Status  Revised    Target Date  11/21/19      PT LONG TERM GOAL #6   Title  Pt will report  ability to participate in MRADL - cooking, laundry, cleaning - with supervision of husband or daughter and will report improvement in function on FOTO to >/= 62%    Baseline  28% function > 58% function, is doing laundry but not cooking or cleaning    Time  8    Period  Weeks    Status  On-going    Target Date  11/21/19            Plan - 10/06/19 1451    Clinical Impression Statement  Today's skilled session continued to address gait with no AD, dynamic gait and balance on compliant surfaces. No issues reported other than fatigue. Rest breaks taken through out session. The pt  is progressing toward goals and should benefit from continued PT to progress toward unmet goals.    Personal Factors and Comorbidities  Comorbidity 3+;Finances;Fitness;Transportation    Comorbidities  cancer of R colon with colectomy and chemotherapy, constipation, CAD, CVA, cardiomegaly, HLD, HTN, anemia, NSTEMI, obesity, chronic back pain after back surgery, and a history of motion sickness    Examination-Activity Limitations  Bend;Locomotion Level;Stairs;Stand    Examination-Participation Restrictions  Cleaning;Laundry;Meal Prep    Stability/Clinical Decision Making  Evolving/Moderate complexity    Rehab Potential  Good    PT Frequency  2x / week    PT Duration  8 weeks    PT Treatment/Interventions  ADLs/Self Care Home Management;Canalith Repostioning;Cryotherapy;Moist Heat;DME Instruction;Gait training;Stair training;Functional mobility training;Therapeutic activities;Therapeutic exercise;Balance training;Neuromuscular re-education;Patient/family education;Orthotic Fit/Training;Vestibular    PT Next Visit Plan  ASSESS BP; Higher level gait training without AD, compliant surfaces, around and over obstacles, stairs.  Gastroc stretch; staggered stance weight shifting, tandem gait to HEP? Endurance on Nustep.  Postural exercises, balance with decreased UE support, weight shifting and SLS activities    PT Home Exercise  Plan  [id number]    Consulted and Agree with Plan of Care  Patient       Patient will benefit from skilled therapeutic intervention in order to improve the following deficits and impairments:  Decreased activity tolerance, Decreased balance, Decreased endurance, Decreased strength, Difficulty walking, Dizziness, Impaired sensation, Postural dysfunction, Pain  Visit Diagnosis: Unsteadiness on feet  Muscle weakness (generalized)  Repeated falls  Other disturbances of skin sensation  Difficulty in walking, not elsewhere classified  Abnormal posture     Problem List Patient Active Problem List   Diagnosis Date Noted  . Generalized weakness 07/05/2019  . Diarrhea 07/05/2019  . Fall at home, initial encounter 07/05/2019  . Port-A-Cath in place 02/23/2019  . Family history of colon cancer   . Personal history of colonic polyps   . History of MI (myocardial infarction) 01/22/2019  . Severe obesity ([place])   . PONV (postoperative nausea and vomiting)   . Iron deficiency anemia   . Hypertension   . Hyperlipidemia   . History of cardiomegaly   . GERD (gastroesophageal reflux disease)   . Cancer of right colon - resected 01/17/2019 01/17/2019  . CAD S/P percutaneous coronary angioplasty 07/10/2016  . Essential hypertension 02/28/2016  . Dyslipidemia, goal LDL below 70 02/28/2016    Willow Ora, PTA, [facility] [address], [place], [place] [address] [phone] 10/06/19, 4:33 PM   Name: [name] MRN: [id number] Date of Birth: 03-18-1946

## 2019-10-13 ENCOUNTER — Ambulatory Visit: Payer: Medicare Other | Attending: Family Medicine | Admitting: Physical Therapy

## 2019-10-13 ENCOUNTER — Other Ambulatory Visit: Payer: Self-pay

## 2019-10-13 ENCOUNTER — Encounter: Payer: Self-pay | Admitting: Physical Therapy

## 2019-10-13 DIAGNOSIS — R209 Unspecified disturbances of skin sensation: Secondary | ICD-10-CM | POA: Diagnosis present

## 2019-10-13 DIAGNOSIS — M6281 Muscle weakness (generalized): Secondary | ICD-10-CM | POA: Insufficient documentation

## 2019-10-13 DIAGNOSIS — R293 Abnormal posture: Secondary | ICD-10-CM | POA: Diagnosis present

## 2019-10-13 DIAGNOSIS — R262 Difficulty in walking, not elsewhere classified: Secondary | ICD-10-CM | POA: Insufficient documentation

## 2019-10-13 DIAGNOSIS — R2681 Unsteadiness on feet: Secondary | ICD-10-CM | POA: Insufficient documentation

## 2019-10-13 DIAGNOSIS — R208 Other disturbances of skin sensation: Secondary | ICD-10-CM

## 2019-10-13 DIAGNOSIS — R296 Repeated falls: Secondary | ICD-10-CM | POA: Diagnosis present

## 2019-10-13 NOTE — Therapy (Addendum)
[facility] [address] [place], [place], [address] Phone: [phone]   Fax:  [phone]  Physical Therapy Treatment  Patient Details  Name: [name] MRN: [id number] Date of Birth: 02-19-1946 Referring Provider (PT): Referred by [facility] physician; certification will be sent to PCP - Chesley Noon, MD   Encounter Date: 10/13/2019  PT End of Session - 10/13/19 1532    Visit Number  13    Number of Visits  26    Date for PT Re-Evaluation  11/21/19    Authorization Type  [employer] $40 copay.  10th visit PN    Progress Note Due on Visit  20    PT Start Time  1445    PT Stop Time  1530    PT Time Calculation (min)  45 min    Equipment Utilized During Treatment  Gait belt    Activity Tolerance  Patient tolerated treatment well    Behavior During Therapy  WFL for tasks assessed/performed       Past Medical History:  Diagnosis Date  . Cancer of right colon ([place])   . Constipation   . Coronary artery disease   . CVA (cerebral infarction) 1996  . Dyspnea    going up stairs and hills  . Family history of colon cancer   . GERD (gastroesophageal reflux disease)   . History of cardiomegaly   . Hyperlipidemia   . Hypertension   . Iron deficiency anemia   . Myocardial infarction ([place]) 2017  . NSTEMI (non-ST elevated myocardial infarction) ([place])   . Personal history of colonic polyps   . PONV (postoperative nausea and vomiting)   . Right shoulder pain    more painful at night , cold makes it worse  . Severe obesity ([place])     Past Surgical History:  Procedure Laterality Date  . ABDOMINAL HYSTERECTOMY    . BACK SURGERY     x2  . CARDIAC CATHETERIZATION N/A 02/28/2016   Procedure: Left Heart Cath and Coronary Angiography;  Surgeon: Peter M Martinique, MD;  Location: [facility];  Service: Cardiovascular;  Laterality: N/A;  . CARDIAC CATHETERIZATION N/A 02/28/2016   Procedure: Coronary Stent  Intervention;  Surgeon: Peter M Martinique, MD;  Location: [facility];  Service: Cardiovascular;  Laterality: N/A;  . CHOLECYSTECTOMY    . COLONOSCOPY  11/24/2018  . CORONARY ANGIOPLASTY    . CYST EXCISION Left    Cheek  . LAPAROSCOPIC RIGHT HEMI COLECTOMY Right 01/17/2019   Procedure: LAPAROSCOPIC RIGHT HEMI COLECTOMY;  Surgeon: Alphonsa Overall, MD;  Location: [facility];  Service: General;  Laterality: Right;  . NASAL FRACTURE SURGERY    . PORTACATH PLACEMENT N/A 02/22/2019   Procedure: POWER PORT PLACEMENT;  Surgeon: Alphonsa Overall, MD;  Location: [facility];  Service: General;  Laterality: N/A;  . UPPER GI ENDOSCOPY  11/24/2018    There were no vitals filed for this visit.  Subjective Assessment - 10/13/19 1449    Subjective  Husband is about the same.  Pt is still fatigued but has been practicing side stepping at the counter.    Pertinent History  cancer of R colon with colectomy and chemotherapy, constipation, CAD, CVA, cardiomegaly, HLD, HTN, anemia, NSTEMI, obesity, chronic back pain after back surgery, and a history of motion sickness    Limitations  Standing;Walking;House hold activities    Patient Stated Goals  To get back on her feet and walk without feeling like she is going  to fall - started last June after finding out she had colon CA - 18 inches removed + chemo.    Currently in Pain?  No/denies                       [facility] Adult PT Treatment/Exercise - 10/13/19 1459      Ambulation/Gait   Ambulation/Gait  Yes    Ambulation/Gait Assistance  5: Supervision    Ambulation/Gait Assistance Details  without AD, cues to heel strike    Ambulation Distance (Feet)  115 Feet    Assistive device  None    Gait Pattern  Step-through pattern;Decreased step length - right;Decreased step length - left;Decreased stride length;Left foot flat;Right foot flat;Right flexed knee in stance;Left flexed knee in stance;Shuffle;Poor foot clearance - left;Poor foot clearance - right;Trunk  flexed    Ambulation Surface  Level;Indoor      Knee/Hip Exercises: Aerobic   Nustep  Level 4 resistance x 8 minutes with bilat UE and LE for aerobic conditioning.            Balance Exercises - 10/13/19 1500      Balance Exercises: Standing   Turning  Right;Left;3 reps;Other (comment)    Other Standing Exercises  In // bars performed walking forwards with 180 turns to L and R without UE support but pt required increased number of steps to effectively turn.  Changed to 90 deg turns in standing to L and R x 5 reps each focusing on pelvic rotation and increased step length when pivoting, performed first with UE support and then performed alternating L and R 90 deg turns without UE support x 5 reps each direction.  Transitioned to 180 deg turns to L and R without UE support x 5 reps each direction.  Returned to walking without UE support and adding in 180 deg turns to L and R using counting to 4 to initiate stepping/pivoting.        PT Education - 10/13/19 1532    Education Details  Discussed progress made and areas to continue to focus on, will schedule 4 more weeks of therapy    Person(s) Educated  Patient    Methods  Explanation    Comprehension  Verbalized understanding       PT Short Term Goals - 09/22/19 1708      PT SHORT TERM GOAL #1   Title  Pt will demonstrate independence with ongoing HEP    Time  4    Period  Weeks    Status  Revised    Target Date  10/22/19      PT SHORT TERM GOAL #2   Title  Pt will improve LE strength as indicated by decrease in time to perform five time sit to stand to </= 18 seconds without use of UE    Baseline  20 without use of UE    Time  4    Period  Weeks    Status  Revised    Target Date  10/22/19      PT SHORT TERM GOAL #3   Title  Pt will improve gait velocity with LRAD to >/= 2.8 ft/sec without AD    Baseline  2.6 without AD    Time  4    Period  Weeks    Status  Revised    Target Date  10/22/19      PT SHORT TERM GOAL #4    Title  Pt will increase BERG  to >/= 50/56 to indicate decreased falls risk    Baseline  46/56    Time  4    Period  Weeks    Status  Revised    Target Date  10/22/19      PT SHORT TERM GOAL #5   Title  Pt will demonstrate ability to reach down to the ground multiple times with supervision without LOB    Time  4    Period  Weeks    Status  Revised    Target Date  10/22/19        PT Long Term Goals - 09/22/19 1715      PT LONG TERM GOAL #1   Title  Pt will be independent with final HEP for vestibular, balance and strengthening    Time  8    Period  Weeks    Status  Revised    Target Date  11/21/19      PT LONG TERM GOAL #2   Title  Will report 10 point further decrease in [place]    Baseline  84 severe > 62 (22 point decrease)    Time  8    Period  Weeks    Status  Revised    Target Date  11/21/19      PT LONG TERM GOAL #3   Title  Pt will increase gait velocity without AD to 3.0 ft/sec    Baseline  1.7 > 2.5 ft/sec    Time  8    Period  Weeks    Status  Revised    Target Date  11/21/19      PT LONG TERM GOAL #4   Title  Pt will increase BERG to >/= 52/56 to indicate decreased risk for falls    Time  8    Period  Weeks    Status  Revised    Target Date  11/21/19      PT LONG TERM GOAL #5   Title  Pt will decrease time to perform five time sit to stand to </= 16 seconds without use of UE    Baseline  22 seconds with UE use; 20 seconds but without UE use    Time  8    Period  Weeks    Status  Revised    Target Date  11/21/19      PT LONG TERM GOAL #6   Title  Pt will report ability to participate in MRADL - cooking, laundry, cleaning - with supervision of husband or daughter and will report improvement in function on FOTO to >/= 62%    Baseline  28% function > 58% function, is doing laundry but not cooking or cleaning    Time  8    Period  Weeks    Status  On-going    Target Date  11/21/19            Plan - 10/13/19 1642    Clinical Impression  Statement  Continued to address fatigue with progressive aerobic conditioning and continuing higher level gait training on level surfaces without AD focusing on improving balance with turns to left and to right.  Pt has greater difficulty taking full step and maintaining balance when stepping to the left and when turning is incorporated into ambulation.  Pt requires less frequent seated rest breaks.  Pt is still uncertain that she is making progress - therapist continues to provide objective examples of pt's progress and continues to encourage pt.  Pt  continues to be concerned about finances.    Personal Factors and Comorbidities  Comorbidity 3+;Finances;Fitness;Transportation    Comorbidities  cancer of R colon with colectomy and chemotherapy, constipation, CAD, CVA, cardiomegaly, HLD, HTN, anemia, NSTEMI, obesity, chronic back pain after back surgery, and a history of motion sickness    Examination-Activity Limitations  Bend;Locomotion Level;Stairs;Stand    Examination-Participation Restrictions  Cleaning;Laundry;Meal Prep    Stability/Clinical Decision Making  Evolving/Moderate complexity    Rehab Potential  Good    PT Frequency  2x / week    PT Duration  8 weeks    PT Treatment/Interventions  ADLs/Self Care Home Management;Canalith Repostioning;Cryotherapy;Moist Heat;DME Instruction;Gait training;Stair training;Functional mobility training;Therapeutic activities;Therapeutic exercise;Balance training;Neuromuscular re-education;Patient/family education;Orthotic Fit/Training;Vestibular    PT Next Visit Plan  STG due by 5/16.  Higher level gait training without AD, making turns with fewer steps, compliant surfaces, around and over obstacles, stairs.  Gastroc stretch; staggered stance weight shifting, tandem gait to HEP? Endurance on Nustep.  Postural exercises, balance with decreased UE support, weight shifting and SLS activities    PT Home Exercise Plan  [id number]    Consulted and Agree with Plan of Care   Patient       Patient will benefit from skilled therapeutic intervention in order to improve the following deficits and impairments:  Decreased activity tolerance, Decreased balance, Decreased endurance, Decreased strength, Difficulty walking, Dizziness, Impaired sensation, Postural dysfunction, Pain  Visit Diagnosis: Unsteadiness on feet  Muscle weakness (generalized)  Repeated falls  Other disturbances of skin sensation  Difficulty in walking, not elsewhere classified  Abnormal posture     Problem List Patient Active Problem List   Diagnosis Date Noted  . Generalized weakness 07/05/2019  . Diarrhea 07/05/2019  . Fall at home, initial encounter 07/05/2019  . Port-A-Cath in place 02/23/2019  . Family history of colon cancer   . Personal history of colonic polyps   . History of MI (myocardial infarction) 01/22/2019  . Severe obesity ([place])   . PONV (postoperative nausea and vomiting)   . Iron deficiency anemia   . Hypertension   . Hyperlipidemia   . History of cardiomegaly   . GERD (gastroesophageal reflux disease)   . Cancer of right colon - resected 01/17/2019 01/17/2019  . CAD S/P percutaneous coronary angioplasty 07/10/2016  . Essential hypertension 02/28/2016  . Dyslipidemia, goal LDL below 70 02/28/2016    Rico Junker, PT, DPT 10/13/19    4:47 PM    [facility] [address] [place], [place], [address] Phone: [phone]   Fax:  [phone]  Name: [name] MRN: [id number] Date of Birth: Oct 31, 1945

## 2019-10-20 ENCOUNTER — Encounter: Payer: Self-pay | Admitting: Physical Therapy

## 2019-10-20 ENCOUNTER — Ambulatory Visit: Payer: Medicare Other | Admitting: Physical Therapy

## 2019-10-20 ENCOUNTER — Other Ambulatory Visit: Payer: Self-pay

## 2019-10-20 DIAGNOSIS — R208 Other disturbances of skin sensation: Secondary | ICD-10-CM

## 2019-10-20 DIAGNOSIS — R296 Repeated falls: Secondary | ICD-10-CM

## 2019-10-20 DIAGNOSIS — R2681 Unsteadiness on feet: Secondary | ICD-10-CM

## 2019-10-20 DIAGNOSIS — R262 Difficulty in walking, not elsewhere classified: Secondary | ICD-10-CM

## 2019-10-20 DIAGNOSIS — M6281 Muscle weakness (generalized): Secondary | ICD-10-CM

## 2019-10-20 DIAGNOSIS — R293 Abnormal posture: Secondary | ICD-10-CM

## 2019-10-20 NOTE — Therapy (Signed)
[facility] [address] [place], [place], [address] Phone: [phone]   Fax:  [phone]  Physical Therapy Treatment  Patient Details  Name: [name] MRN: [id number] Date of Birth: 1945/11/21 Referring Provider (PT): Referred by [facility] physician; certification will be sent to PCP - Chesley Noon, MD   Encounter Date: 10/20/2019  PT End of Session - 10/20/19 1555    Visit Number  14    Number of Visits  26    Date for PT Re-Evaluation  11/21/19    Authorization Type  [employer] $40 copay.  10th visit PN    Progress Note Due on Visit  20    PT Start Time  1445    PT Stop Time  1530    PT Time Calculation (min)  45 min    Activity Tolerance  Patient tolerated treatment well    Behavior During Therapy  WFL for tasks assessed/performed       Past Medical History:  Diagnosis Date  . Cancer of right colon ([place])   . Constipation   . Coronary artery disease   . CVA (cerebral infarction) 1996  . Dyspnea    going up stairs and hills  . Family history of colon cancer   . GERD (gastroesophageal reflux disease)   . History of cardiomegaly   . Hyperlipidemia   . Hypertension   . Iron deficiency anemia   . Myocardial infarction ([place]) 2017  . NSTEMI (non-ST elevated myocardial infarction) ([place])   . Personal history of colonic polyps   . PONV (postoperative nausea and vomiting)   . Right shoulder pain    more painful at night , cold makes it worse  . Severe obesity ([place])     Past Surgical History:  Procedure Laterality Date  . ABDOMINAL HYSTERECTOMY    . BACK SURGERY     x2  . CARDIAC CATHETERIZATION N/A 02/28/2016   Procedure: Left Heart Cath and Coronary Angiography;  Surgeon: Peter M Martinique, MD;  Location: [facility];  Service: Cardiovascular;  Laterality: N/A;  . CARDIAC CATHETERIZATION N/A 02/28/2016   Procedure: Coronary Stent Intervention;  Surgeon: Peter M Martinique, MD;  Location: [facility];  Service: Cardiovascular;  Laterality: N/A;  . CHOLECYSTECTOMY    . COLONOSCOPY  11/24/2018  . CORONARY ANGIOPLASTY    . CYST EXCISION Left    Cheek  . LAPAROSCOPIC RIGHT HEMI COLECTOMY Right 01/17/2019   Procedure: LAPAROSCOPIC RIGHT HEMI COLECTOMY;  Surgeon: Alphonsa Overall, MD;  Location: [facility];  Service: General;  Laterality: Right;  . NASAL FRACTURE SURGERY    . PORTACATH PLACEMENT N/A 02/22/2019   Procedure: POWER PORT PLACEMENT;  Surgeon: Alphonsa Overall, MD;  Location: [facility];  Service: General;  Laterality: N/A;  . UPPER GI ENDOSCOPY  11/24/2018    There were no vitals filed for this visit.  Subjective Assessment - 10/20/19 1456    Subjective  Feels about the same, doesn't feel like she is much better.  Some days she has a lot of energy and some days she feels very fatigued.  Has been doing her exercises.    Pertinent History  cancer of R colon with colectomy and chemotherapy, constipation, CAD, CVA, cardiomegaly, HLD, HTN, anemia, NSTEMI, obesity, chronic back pain after back surgery, and a history of motion sickness    Limitations  Standing;Walking;House hold activities    Patient Stated Goals  To get back on her feet and walk without feeling  like she is going to fall - started last June after finding out she had colon CA - 18 inches removed + chemo.    Currently in Pain?  No/denies                        [facility] Adult PT Treatment/Exercise - 10/20/19 1541      Ambulation/Gait   Ambulation/Gait  Yes    Ambulation/Gait Assistance  4: Min assist;3: Mod assist    Ambulation/Gait Assistance Details  performed outside on gravel to assess pt safety with ambulating outside without an AD since pt would not be able to take her rollator outside without assistance.  Pt performed forwards, R and L lateral stepping along 10' gravel.  Required min A when walking forwards due to shuffling gait and intermittent LOB.  Required mod A when side stepping due to  difficulty with lateral weight shifting and taking increased step length; as pt fatigued she demonstrated more shuffling of feet, smaller step length and freezing of gait.  Performed x 2 reps each direction    Ambulation Distance (Feet)  10 Feet    Assistive device  None    Gait Pattern  Step-to pattern;Step-through pattern;Decreased step length - right;Decreased step length - left;Decreased stride length;Decreased hip/knee flexion - right;Decreased hip/knee flexion - left;Decreased dorsiflexion - right;Decreased dorsiflexion - left;Decreased weight shift to left;Right flexed knee in stance;Left flexed knee in stance;Shuffle;Trunk flexed;Poor foot clearance - left;Poor foot clearance - right    Ambulation Surface  Unlevel;Outdoor;Gravel      Therapeutic Activites    Therapeutic Activities  Other Therapeutic Activities    Other Therapeutic Activities  Pt continues to ask if she is making progress and verbalized discouragement about ongoing issues with fatigue and balance.  Reinforced the progress pt has made and what current barriers to more progress may be (deconditioning, fear/anxiety of falling).  Also discussed patient's specific goals and what she feels is limiting her progress.  Pt reports she wants to be able to get up and do all the household chores she used to do - cooking, laundry, washing dishes, sweeping and vaccuuming.  Pt reports she is doing some light cooking, dishes and laundry but fatigues very quickly and has to take a lot of rest breaks.  She also would like to walk outside but would have to descend stairs without a rail and walk down a gravel driveway to her road.  Discussed realistic expectations with patient regarding speed of progress after going through cancer, surgery, chemotherapy and that recovery time is expected to be protracted.  Discussed the importance of energy conservation and graded return to activities and setting small, realistic goals each week to gradually progress  endurance and activity tolerance.  Pt verbalized agreement with this.          Balance Exercises - 10/20/19 1553      Balance Exercises: Standing   Retro Gait  Upper extremity support;3 reps   3 trips down counter with UE support   Other Standing Exercises  for retro gait pt required verbal cues for increased step length and full weight shift backwards.        PT Education - 10/20/19 1554    Education Details  see TA, gait outside    Person(s) Educated  Patient    Methods  Explanation    Comprehension  Verbalized understanding;Need further instruction       PT Short Term Goals - 09/22/19 1708  PT SHORT TERM GOAL #1   Title  Pt will demonstrate independence with ongoing HEP    Time  4    Period  Weeks    Status  Revised    Target Date  10/22/19      PT SHORT TERM GOAL #2   Title  Pt will improve LE strength as indicated by decrease in time to perform five time sit to stand to </= 18 seconds without use of UE    Baseline  20 without use of UE    Time  4    Period  Weeks    Status  Revised    Target Date  10/22/19      PT SHORT TERM GOAL #3   Title  Pt will improve gait velocity with LRAD to >/= 2.8 ft/sec without AD    Baseline  2.6 without AD    Time  4    Period  Weeks    Status  Revised    Target Date  10/22/19      PT SHORT TERM GOAL #4   Title  Pt will increase BERG to >/= 50/56 to indicate decreased falls risk    Baseline  46/56    Time  4    Period  Weeks    Status  Revised    Target Date  10/22/19      PT SHORT TERM GOAL #5   Title  Pt will demonstrate ability to reach down to the ground multiple times with supervision without LOB    Time  4    Period  Weeks    Status  Revised    Target Date  10/22/19        PT Long Term Goals - 09/22/19 1715      PT LONG TERM GOAL #1   Title  Pt will be independent with final HEP for vestibular, balance and strengthening    Time  8    Period  Weeks    Status  Revised    Target Date  11/21/19       PT LONG TERM GOAL #2   Title  Will report 10 point further decrease in [place]    Baseline  84 severe > 62 (22 point decrease)    Time  8    Period  Weeks    Status  Revised    Target Date  11/21/19      PT LONG TERM GOAL #3   Title  Pt will increase gait velocity without AD to 3.0 ft/sec    Baseline  1.7 > 2.5 ft/sec    Time  8    Period  Weeks    Status  Revised    Target Date  11/21/19      PT LONG TERM GOAL #4   Title  Pt will increase BERG to >/= 52/56 to indicate decreased risk for falls    Time  8    Period  Weeks    Status  Revised    Target Date  11/21/19      PT LONG TERM GOAL #5   Title  Pt will decrease time to perform five time sit to stand to </= 16 seconds without use of UE    Baseline  22 seconds with UE use; 20 seconds but without UE use    Time  8    Period  Weeks    Status  Revised    Target Date  11/21/19  PT LONG TERM GOAL #6   Title  Pt will report ability to participate in [place], laundry, cleaning - with supervision of husband or daughter and will report improvement in function on FOTO to >/= 62%    Baseline  28% function > 58% function, is doing laundry but not cooking or cleaning    Time  8    Period  Weeks    Status  On-going    Target Date  11/21/19            Plan - 10/20/19 1555    Clinical Impression Statement  Continued to discuss with pt her specific goals and barriers to progress and functional progress at home.  Main barriers are fatigue/deconditioning and anxiety/fear of falling.  Educated pt on ways to slowly and gradually progress activity level and tolerance while practicing energy conservation.  Assessed gait outside on gravel - pt is NOT safe to perform gait outside without AD and without assistance.  Will continue to address these goals with patient.    Personal Factors and Comorbidities  Comorbidity 3+;Finances;Fitness;Transportation    Comorbidities  cancer of R colon with colectomy and chemotherapy, constipation,  CAD, CVA, cardiomegaly, HLD, HTN, anemia, NSTEMI, obesity, chronic back pain after back surgery, and a history of motion sickness    Examination-Activity Limitations  Bend;Locomotion Level;Stairs;Stand    Examination-Participation Restrictions  Cleaning;Laundry;Meal Prep    Stability/Clinical Decision Making  Evolving/Moderate complexity    Rehab Potential  Good    PT Frequency  2x / week    PT Duration  8 weeks    PT Treatment/Interventions  ADLs/Self Care Home Management;Canalith Repostioning;Cryotherapy;Moist Heat;DME Instruction;Gait training;Stair training;Functional mobility training;Therapeutic activities;Therapeutic exercise;Balance training;Neuromuscular re-education;Patient/family education;Orthotic Fit/Training;Vestibular    PT Next Visit Plan  Check STG.   Higher level gait training without AD, making turns with fewer steps, compliant surfaces/outside on pavement and gravel, walking backwards and side stepping, around and over obstacles, stairs. Work on functional standing tolerance/endurance - laundry, dishes, sweeping, vac.    PT Home Exercise Plan  [id number]    Consulted and Agree with Plan of Care  Patient       Patient will benefit from skilled therapeutic intervention in order to improve the following deficits and impairments:  Decreased activity tolerance, Decreased balance, Decreased endurance, Decreased strength, Difficulty walking, Dizziness, Impaired sensation, Postural dysfunction, Pain  Visit Diagnosis: Unsteadiness on feet  Muscle weakness (generalized)  Repeated falls  Other disturbances of skin sensation  Difficulty in walking, not elsewhere classified  Abnormal posture     Problem List Patient Active Problem List   Diagnosis Date Noted  . Generalized weakness 07/05/2019  . Diarrhea 07/05/2019  . Fall at home, initial encounter 07/05/2019  . Port-A-Cath in place 02/23/2019  . Family history of colon cancer   . Personal history of colonic polyps   .  History of MI (myocardial infarction) 01/22/2019  . Severe obesity ([place])   . PONV (postoperative nausea and vomiting)   . Iron deficiency anemia   . Hypertension   . Hyperlipidemia   . History of cardiomegaly   . GERD (gastroesophageal reflux disease)   . Cancer of right colon - resected 01/17/2019 01/17/2019  . CAD S/P percutaneous coronary angioplasty 07/10/2016  . Essential hypertension 02/28/2016  . Dyslipidemia, goal LDL below 70 02/28/2016   Rico Junker, PT, DPT 10/20/19    4:02 PM    [facility] [address] [place], [place], [address] Phone: [phone]  Fax:  [phone]  Name: [name] MRN: [id number] Date of Birth: [birth year]

## 2019-10-27 ENCOUNTER — Ambulatory Visit: Payer: Medicare Other | Admitting: Rehabilitative and Restorative Service Providers"

## 2019-10-27 ENCOUNTER — Encounter: Payer: Self-pay | Admitting: Rehabilitative and Restorative Service Providers"

## 2019-10-27 ENCOUNTER — Other Ambulatory Visit: Payer: Self-pay

## 2019-10-27 DIAGNOSIS — R2681 Unsteadiness on feet: Secondary | ICD-10-CM | POA: Diagnosis not present

## 2019-10-27 DIAGNOSIS — M6281 Muscle weakness (generalized): Secondary | ICD-10-CM

## 2019-10-27 DIAGNOSIS — R262 Difficulty in walking, not elsewhere classified: Secondary | ICD-10-CM

## 2019-10-27 DIAGNOSIS — R296 Repeated falls: Secondary | ICD-10-CM

## 2019-10-27 NOTE — Therapy (Addendum)
[name] [address] [place], [place], [address] Phone: [phone]   Fax:  [phone]  Physical Therapy Treatment  Patient Details  Name: [name] MRN: [id number] Date of Birth: Jul 31, 1945 Referring Provider (PT): Referred by [facility] physician; certification will be sent to PCP - Chesley Noon, MD   Encounter Date: 10/27/2019  PT End of Session - 10/27/19 1605    Visit Number  15    Number of Visits  26    Date for PT Re-Evaluation  11/21/19    Authorization Type  [employer] $40 copay.  10th visit PN    Progress Note Due on Visit  20    PT Start Time  1400    PT Stop Time  1445    PT Time Calculation (min)  45 min    Equipment Utilized During Treatment  Gait belt    Activity Tolerance  Patient tolerated treatment well    Behavior During Therapy  WFL for tasks assessed/performed       Past Medical History:  Diagnosis Date  . Cancer of right colon ([place])   . Constipation   . Coronary artery disease   . CVA (cerebral infarction) 1996  . Dyspnea    going up stairs and hills  . Family history of colon cancer   . GERD (gastroesophageal reflux disease)   . History of cardiomegaly   . Hyperlipidemia   . Hypertension   . Iron deficiency anemia   . Myocardial infarction ([place]) 2017  . NSTEMI (non-ST elevated myocardial infarction) ([place])   . Personal history of colonic polyps   . PONV (postoperative nausea and vomiting)   . Right shoulder pain    more painful at night , cold makes it worse  . Severe obesity ([place])     Past Surgical History:  Procedure Laterality Date  . ABDOMINAL HYSTERECTOMY    . BACK SURGERY     x2  . CARDIAC CATHETERIZATION N/A 02/28/2016   Procedure: Left Heart Cath and Coronary Angiography;  Surgeon: Peter M Martinique, MD;  Location: [place] [facility];  Service: Cardiovascular;  Laterality: N/A;  . CARDIAC CATHETERIZATION N/A 02/28/2016   Procedure: Coronary Stent  Intervention;  Surgeon: Peter M Martinique, MD;  Location: [place];  Service: Cardiovascular;  Laterality: N/A;  . CHOLECYSTECTOMY    . COLONOSCOPY  11/24/2018  . CORONARY ANGIOPLASTY    . CYST EXCISION Left    Cheek  . LAPAROSCOPIC RIGHT HEMI COLECTOMY Right 01/17/2019   Procedure: LAPAROSCOPIC RIGHT HEMI COLECTOMY;  Surgeon: Alphonsa Overall, MD;  Location: [facility];  Service: General;  Laterality: Right;  . NASAL FRACTURE SURGERY    . PORTACATH PLACEMENT N/A 02/22/2019   Procedure: POWER PORT PLACEMENT;  Surgeon: Alphonsa Overall, MD;  Location: [facility];  Service: General;  Laterality: N/A;  . UPPER GI ENDOSCOPY  11/24/2018    There were no vitals filed for this visit.  Subjective Assessment - 10/27/19 1359    Subjective  Denies any changes. Denies any falls. She reports that she feels like she is going to fall often especially when trying to pick something up/bending forward, but not actually falling. My feet have been hurting some lately and my legs will randomly hurt some, too.I'm working on at least some of my exercises every day.    Pertinent History  cancer of R colon with colectomy and chemotherapy, constipation, CAD, CVA, cardiomegaly, HLD, HTN, anemia, NSTEMI, obesity, chronic back pain after back  surgery, and a history of motion sickness    Limitations  Standing;Walking;House hold activities    Patient Stated Goals  To get back on her feet and walk without feeling like she is going to fall - started last June after finding out she had colon CA - 18 inches removed + chemo.    Currently in Pain?  No/denies         [facility] PT Assessment - 10/27/19 1408      Berg Balance Test   Sit to Stand  Able to stand without using hands and stabilize independently    Standing Unsupported  Able to stand safely 2 minutes    Sitting with Back Unsupported but Feet Supported on Floor or Stool  Able to sit safely and securely 2 minutes    Stand to Sit  Sits safely with minimal use of hands     Transfers  Able to transfer safely, minor use of hands    Standing Unsupported with Eyes Closed  Able to stand 10 seconds safely    Standing Unsupported with Feet Together  Able to place feet together independently and stand 1 minute safely    From Standing, Reach Forward with Outstretched Arm  Can reach forward >12 cm safely (5")    From Standing Position, Pick up Object from Floor  Able to pick up shoe safely and easily    From Standing Position, Turn to Look Behind Over each Shoulder  Looks behind from both sides and weight shifts well    Turn 360 Degrees  Able to turn 360 degrees safely but slowly    Standing Unsupported, Alternately Place Feet on Step/Stool  Able to complete >2 steps/needs minimal assist    Standing Unsupported, One Foot in Front  Able to plae foot ahead of the other independently and hold 30 seconds    Standing on One Leg  Tries to lift leg/unable to hold 3 seconds but remains standing independently    Total Score  46    Berg comment:  46-51 = Moderate (>50%) fall risk                     [facility] Adult PT Treatment/Exercise - 10/27/19 1408      Transfers   Five time sit to stand comments   16.87 seconds with use of UEs to push up   23 seconds at baseline measurement     Ambulation/Gait   Ambulation/Gait  Yes    Ambulation/Gait Assistance  5: Supervision    Ambulation/Gait Assistance Details  requires supervision for safety and cueing to attend to obstcles around her especially once fatigued     Ambulation Distance (Feet)  100 Feet   100 x 1, multiple short distance in/around PT gym   Assistive device  Rollator    Gait Pattern  Step-to pattern;Step-through pattern;Decreased step length - right;Decreased step length - left;Decreased stride length;Decreased hip/knee flexion - right;Decreased hip/knee flexion - left;Decreased dorsiflexion - right;Decreased dorsiflexion - left;Decreased weight shift to left;Right flexed knee in stance;Left flexed knee in  stance;Shuffle;Trunk flexed;Poor foot clearance - left;Poor foot clearance - right    Ambulation Surface  Level;Indoor    Gait velocity  2.07 ft/sec with rollator   15.87 seconds     Therapeutic Activites    Therapeutic Activities  Other Therapeutic Activities    Other Therapeutic Activities  Performed multiple repetitions of forward reaching to the ground to retreive object. Demo ability to perform 5 repetitions back-to-back with no LOB,  but benefitted from cueing after first repetition to widen BOS      Neuro Re-ed    Neuro Re-ed Details   At counter with unilateral support fwd walking, marching in place, marching forward, sidestepping (both directions; B hand support) with close S to up to intermittent min A due to increase in sway and misstep when fatigued. 6 laps total. Attempted one lap at counter with backwards walking, but was very challenged requiring min A due to substantial narrowing of BOS (this was also last exercise; therefore, likely impacted by fatigue)              PT Education - 10/27/19 1605    Education Details  fall risk in light of today's standardized test findings and findings from short term goal assessment    Person(s) Educated  Patient    Methods  Explanation    Comprehension  Verbalized understanding;Need further instruction       PT Short Term Goals - 10/27/19 1406      PT SHORT TERM GOAL #1   Title  Pt will demonstrate independence with ongoing HEP    Baseline  MET 10/27/19    Time  4    Period  Weeks    Status  Achieved    Target Date  --      PT SHORT TERM GOAL #2   Title  Pt will improve LE strength as indicated by decrease in time to perform five time sit to stand to </= 18 seconds without use of UE    Baseline  10/27/19: without use of UEs = 23 seconds; with use of UEs = 16.87 seconds    Time  4    Period  Weeks    Status  Partially Met    Target Date  --      PT SHORT TERM GOAL #3   Title  Pt will improve gait velocity with LRAD to >/=  2.8 ft/sec without AD    Baseline  NOT MET 10/27/19: 2.60f/sec with rollator    Time  4    Period  Weeks    Status  Not Met    Target Date  --      PT SHORT TERM GOAL #4   Title  Pt will increase BERG to >/= 50/56 to indicate decreased falls risk    Baseline  NOT MET 10/27/19: berg balance test score = 46/56    Time  4    Period  Weeks    Status  Not Met    Target Date  --      PT SHORT TERM GOAL #5   Title  Pt will demonstrate ability to reach down to the ground multiple times with supervision without LOB    Baseline  MET 10/27/19: see TA note for details    Time  4    Period  Weeks    Status  Achieved    Target Date  --        PT Long Term Goals - 09/22/19 1715      PT LONG TERM GOAL #1   Title  Pt will be independent with final HEP for vestibular, balance and strengthening    Time  8    Period  Weeks    Status  Revised    Target Date  11/21/19      PT LONG TERM GOAL #2   Title  Will report 10 point further decrease in [place]   Baseline  84  severe > 62 (22 point decrease)    Time  8    Period  Weeks    Status  Revised    Target Date  11/21/19      PT LONG TERM GOAL #3   Title  Pt will increase gait velocity without AD to 3.0 ft/sec    Baseline  1.7 > 2.5 ft/sec    Time  8    Period  Weeks    Status  Revised    Target Date  11/21/19      PT LONG TERM GOAL #4   Title  Pt will increase BERG to >/= 52/56 to indicate decreased risk for falls    Time  8    Period  Weeks    Status  Revised    Target Date  11/21/19      PT LONG TERM GOAL #5   Title  Pt will decrease time to perform five time sit to stand to </= 16 seconds without use of UE    Baseline  22 seconds with UE use; 20 seconds but without UE use    Time  8    Period  Weeks    Status  Revised    Target Date  11/21/19      PT LONG TERM GOAL #6   Title  Pt will report ability to participate in MRADL - cooking, laundry, cleaning - with supervision of husband or daughter and will report improvement in  function on FOTO to >/= 62%    Baseline  28% function > 58% function, is doing laundry but not cooking or cleaning    Time  8    Period  Weeks    Status  On-going    Target Date  11/21/19            Plan - 10/27/19 1611    Clinical Impression Statement  Today's skilled PT session focused on assessment of patient's short term goals. She has met 2 of 5 STGs, partially met 1 goal, and has not met 2 goals. Patient's partially met goal is related to her five time sit to stand test score of 16.87 seconds with reliance on upper extremities, her unmet goals are related to berg balance test and gait velocity with rollator measurements of 46/56 and 2.55f/sec with rollator, respectively. 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 She is making progress, but would benefit from continued skilled PT in order to maximize her functional mobility and decrease fall risk.    Personal Factors and Comorbidities  Comorbidity 3+;Finances;Fitness;Transportation    Comorbidities  cancer of R colon with colectomy and chemotherapy, constipation, CAD, CVA, cardiomegaly, HLD, HTN, anemia, NSTEMI, obesity, chronic back pain after back surgery, and a history of motion sickness    Examination-Activity Limitations  Bend;Locomotion Level;Stairs;Stand    Examination-Participation Restrictions  Cleaning;Laundry;Meal Prep    Stability/Clinical Decision Making  Evolving/Moderate complexity    Rehab Potential  Good    PT Frequency  2x / week    PT Duration  8 weeks    PT Treatment/Interventions  ADLs/Self Care Home Management;Canalith Repostioning;Cryotherapy;Moist Heat;DME Instruction;Gait training;Stair training;Functional mobility  training;Therapeutic activities;Therapeutic exercise;Balance training;Neuromuscular re-education;Patient/family education;Orthotic Fit/Training;Vestibular    PT Next Visit Plan  Higher level gait  training without AD, making turns with fewer steps, compliant surfaces/outside on pavement and gravel, walking backwards and side stepping, around and over obstacles, stairs. Work on functional standing tolerance/endurance - laundry, dishes, sweeping, vac.    PT Home Exercise Plan  [id number]    Consulted and Agree with Plan of Care  Patient       Patient will benefit from skilled therapeutic intervention in order to improve the following deficits and impairments:  Decreased activity tolerance, Decreased balance, Decreased endurance, Decreased strength, Difficulty walking, Dizziness, Impaired sensation, Postural dysfunction, Pain  Visit Diagnosis: Unsteadiness on feet  Muscle weakness (generalized)  Repeated falls  Difficulty in walking, not elsewhere classified     Problem List Patient Active Problem List   Diagnosis Date Noted  . Generalized weakness 07/05/2019  . Diarrhea 07/05/2019  . Fall at home, initial encounter 07/05/2019  . Port-A-Cath in place 02/23/2019  . Family history of colon cancer   . Personal history of colonic polyps   . History of MI (myocardial infarction) 01/22/2019  . Severe obesity ([place])   . PONV (postoperative nausea and vomiting)   . Iron deficiency anemia   . Hypertension   . Hyperlipidemia   . History of cardiomegaly   . GERD (gastroesophageal reflux disease)   . Cancer of right colon - resected 01/17/2019 01/17/2019  . CAD S/P percutaneous coronary angioplasty 07/10/2016  . Essential hypertension 02/28/2016  . Dyslipidemia, goal LDL below 70 02/28/2016    [place], Jamoni Broadfoot, DPT  10/27/2019, 4:25 PM  [facility] [address] [place], [place], [address] Phone: [phone]   Fax:   [phone]  Name: [name] MRN: [id number] Date of Birth: 1946/03/05

## 2019-11-03 ENCOUNTER — Other Ambulatory Visit: Payer: Self-pay

## 2019-11-03 ENCOUNTER — Ambulatory Visit: Payer: Medicare Other | Admitting: Rehabilitative and Restorative Service Providers"

## 2019-11-03 ENCOUNTER — Ambulatory Visit: Payer: Medicare Other | Admitting: Physical Therapy

## 2019-11-03 ENCOUNTER — Encounter: Payer: Self-pay | Admitting: Rehabilitative and Restorative Service Providers"

## 2019-11-03 DIAGNOSIS — R296 Repeated falls: Secondary | ICD-10-CM

## 2019-11-03 DIAGNOSIS — R262 Difficulty in walking, not elsewhere classified: Secondary | ICD-10-CM

## 2019-11-03 DIAGNOSIS — R293 Abnormal posture: Secondary | ICD-10-CM

## 2019-11-03 DIAGNOSIS — R2681 Unsteadiness on feet: Secondary | ICD-10-CM

## 2019-11-03 DIAGNOSIS — M6281 Muscle weakness (generalized): Secondary | ICD-10-CM

## 2019-11-03 NOTE — Therapy (Signed)
[facility] [address] [place], [place], [address] Phone: [phone]   Fax:  [phone]  Physical Therapy Treatment  Patient Details  Name: [name] MRN: [id number] Date of Birth: 1945/11/05 Referring Provider (PT): Referred by [facility] physician; certification will be sent to PCP - Chesley Noon, MD   Encounter Date: 11/03/2019  PT End of Session - 11/03/19 1416    Visit Number  16    Number of Visits  26    Date for PT Re-Evaluation  11/21/19    Authorization Type  [employer] $40 copay.  10th visit PN    Progress Note Due on Visit  20    PT Start Time  1322    PT Stop Time  1408    PT Time Calculation (min)  46 min    Equipment Utilized During Treatment  Gait belt    Activity Tolerance  Patient tolerated treatment well;Patient limited by fatigue    Behavior During Therapy  WFL for tasks assessed/performed       Past Medical History:  Diagnosis Date  . Cancer of right colon ([place])   . Constipation   . Coronary artery disease   . CVA (cerebral infarction) 1996  . Dyspnea    going up stairs and hills  . Family history of colon cancer   . GERD (gastroesophageal reflux disease)   . History of cardiomegaly   . Hyperlipidemia   . Hypertension   . Iron deficiency anemia   . Myocardial infarction ([place]) 2017  . NSTEMI (non-ST elevated myocardial infarction) ([place])   . Personal history of colonic polyps   . PONV (postoperative nausea and vomiting)   . Right shoulder pain    more painful at night , cold makes it worse  . Severe obesity ([place])     Past Surgical History:  Procedure Laterality Date  . ABDOMINAL HYSTERECTOMY    . BACK SURGERY     x2  . CARDIAC CATHETERIZATION N/A 02/28/2016   Procedure: Left Heart Cath and Coronary Angiography;  Surgeon: Peter M Martinique, MD;  Location: [facility];  Service: Cardiovascular;  Laterality: N/A;  . CARDIAC CATHETERIZATION N/A 02/28/2016   Procedure:  Coronary Stent Intervention;  Surgeon: Peter M Martinique, MD;  Location: [facility];  Service: Cardiovascular;  Laterality: N/A;  . CHOLECYSTECTOMY    . COLONOSCOPY  11/24/2018  . CORONARY ANGIOPLASTY    . CYST EXCISION Left    Cheek  . LAPAROSCOPIC RIGHT HEMI COLECTOMY Right 01/17/2019   Procedure: LAPAROSCOPIC RIGHT HEMI COLECTOMY;  Surgeon: Alphonsa Overall, MD;  Location: [facility];  Service: General;  Laterality: Right;  . NASAL FRACTURE SURGERY    . PORTACATH PLACEMENT N/A 02/22/2019   Procedure: POWER PORT PLACEMENT;  Surgeon: Alphonsa Overall, MD;  Location: [facility];  Service: General;  Laterality: N/A;  . UPPER GI ENDOSCOPY  11/24/2018    There were no vitals filed for this visit.  Subjective Assessment - 11/03/19 1322    Subjective  Patient reports she is feeling off balance some today. Working on her HEP consistently.    Pertinent History  cancer of R colon with colectomy and chemotherapy, constipation, CAD, CVA, cardiomegaly, HLD, HTN, anemia, NSTEMI, obesity, chronic back pain after back surgery, and a history of motion sickness    Limitations  Standing;Walking;House hold activities    Patient Stated Goals  To get back on her feet and walk without feeling like she is going to fall -  started last June after finding out she had colon CA - 18 inches removed + chemo.    Currently in Pain?  No/denies                        [facility] Adult PT Treatment/Exercise - 11/03/19 1327      Transfers   Five time sit to stand comments   --      Ambulation/Gait   Ambulation/Gait  Yes    Ambulation/Gait Assistance  5: Supervision    Ambulation/Gait Assistance Details  requires supervision with rollator especially at end of session due to faitgue     Ambulation Distance (Feet)  115 Feet   115 x 1, multiple short distances in/around PT clinic   Assistive device  Rollator    Gait Pattern  Step-to pattern;Step-through pattern;Decreased step length - right;Decreased step length -  left;Decreased stride length;Decreased hip/knee flexion - right;Decreased hip/knee flexion - left;Decreased dorsiflexion - right;Decreased dorsiflexion - left;Decreased weight shift to left;Right flexed knee in stance;Left flexed knee in stance;Shuffle;Trunk flexed;Poor foot clearance - left;Poor foot clearance - right    Ambulation Surface  Level;Indoor    Gait velocity  --      Therapeutic Activites    Therapeutic Activities  --    Other Therapeutic Activities  --      Neuro Re-ed    Neuro Re-ed Details   At counter: side stepping x 4 laps with cueing to increase step length and to maintain neutral hip rotation. Marching with BUE support (on chair back and counter) x 8 bilaterally, 2 sets (seated rest break between sets). Static standing (feet shoulder width apart): moving cones L hand to R hand (passing 5 cones in both directions R<>L hands), placing cones in upper R cabinet and lower R cabinet (patient is R handed) with only light L hand touch to stabilize - requires cueing to widen BOS for safety when reaching inferiorly. Narrow BOS (feet together) + EO + head turns/nods x 15 seconds, 2 sets in each direction - requires close S with intermittent min A due to increase in sway especially when fatigued. Tends to place excessive weight in heels especially with fatigue. Placing/retrieving 5 cones to/from bottom shelf of cabinets without upper extremity support - requires cueing for proper technique with foot placement and to increase hip/knee flex during squat. Fwd foot taps on bottom shelf of cabinets + 1 finger support on both hands x 8 bilaterally requires min guard due to fatigue                PT Short Term Goals - 10/27/19 1406      PT SHORT TERM GOAL #1   Title  Pt will demonstrate independence with ongoing HEP    Baseline  MET 10/27/19    Time  4    Period  Weeks    Status  Achieved    Target Date  --      PT SHORT TERM GOAL #2   Title  Pt will improve LE strength as indicated  by decrease in time to perform five time sit to stand to </= 18 seconds without use of UE    Baseline  10/27/19: without use of UEs = 23 seconds; with use of UEs = 16.87 seconds    Time  4    Period  Weeks    Status  Partially Met    Target Date  --      PT SHORT TERM GOAL #  3   Title  Pt will improve gait velocity with LRAD to >/= 2.8 ft/sec without AD    Baseline  NOT MET 10/27/19: 2.59f/sec with rollator    Time  4    Period  Weeks    Status  Not Met    Target Date  --      PT SHORT TERM GOAL #4   Title  Pt will increase BERG to >/= 50/56 to indicate decreased falls risk    Baseline  NOT MET 10/27/19: berg balance test score = 46/56    Time  4    Period  Weeks    Status  Not Met    Target Date  --      PT SHORT TERM GOAL #5   Title  Pt will demonstrate ability to reach down to the ground multiple times with supervision without LOB    Baseline  MET 10/27/19: see TA note for details    Time  4    Period  Weeks    Status  Achieved    Target Date  --        PT Long Term Goals - 09/22/19 1715      PT LONG TERM GOAL #1   Title  Pt will be independent with final HEP for vestibular, balance and strengthening    Time  8    Period  Weeks    Status  Revised    Target Date  11/21/19      PT LONG TERM GOAL #2   Title  Will report 10 point further decrease in [place]   Baseline  84 severe > 62 (22 point decrease)    Time  8    Period  Weeks    Status  Revised    Target Date  11/21/19      PT LONG TERM GOAL #3   Title  Pt will increase gait velocity without AD to 3.0 ft/sec    Baseline  1.7 > 2.5 ft/sec    Time  8    Period  Weeks    Status  Revised    Target Date  11/21/19      PT LONG TERM GOAL #4   Title  Pt will increase BERG to >/= 52/56 to indicate decreased risk for falls    Time  8    Period  Weeks    Status  Revised    Target Date  11/21/19      PT LONG TERM GOAL #5   Title  Pt will decrease time to perform five time sit to stand to </= 16 seconds without use  of UE    Baseline  22 seconds with UE use; 20 seconds but without UE use    Time  8    Period  Weeks    Status  Revised    Target Date  11/21/19      PT LONG TERM GOAL #6   Title  Pt will report ability to participate in MRADL - cooking, laundry, cleaning - with supervision of husband or daughter and will report improvement in function on FOTO to >/= 62%    Baseline  28% function > 58% function, is doing laundry but not cooking or cleaning    Time  8    Period  Weeks    Status  On-going    Target Date  11/21/19            Plan - 11/03/19 1417  Clinical Impression Statement  Today's skilled session focused on progression of both standing endurance and high level balance challenges; however, today was greatly limited by fatigue. Patient presented today reporting that she had not rested well recently, and felt tired prior to initiation of session. Presented today with multiple moments of increased posterior sway with poor balance recovery without external cueing from PT in addition to presenting with a heavy reliance on her vision in the maintenance of her balance. She will benefit from continued skilled PT to reduce fall risk and work towards [place].    Personal Factors and Comorbidities  Comorbidity 3+;Finances;Fitness;Transportation    Comorbidities  cancer of R colon with colectomy and chemotherapy, constipation, CAD, CVA, cardiomegaly, HLD, HTN, anemia, NSTEMI, obesity, chronic back pain after back surgery, and a history of motion sickness    Examination-Activity Limitations  Bend;Locomotion Level;Stairs;Stand    Examination-Participation Restrictions  Cleaning;Laundry;Meal Prep    Stability/Clinical Decision Making  Evolving/Moderate complexity    Rehab Potential  Good    PT Frequency  2x / week    PT Duration  8 weeks    PT Treatment/Interventions  ADLs/Self Care Home Management;Canalith Repostioning;Cryotherapy;Moist Heat;DME Instruction;Gait training;Stair training;Functional  mobility training;Therapeutic activities;Therapeutic exercise;Balance training;Neuromuscular re-education;Patient/family education;Orthotic Fit/Training;Vestibular    PT Next Visit Plan  Higher level gait training without AD, making turns with fewer steps, compliant surfaces/outside on pavement and gravel, walking backwards and side stepping, around and over obstacles, stairs. Work on functional standing tolerance/endurance - laundry, dishes, sweeping, vac.    PT Home Exercise Plan  [id number]    Consulted and Agree with Plan of Care  Patient       Patient will benefit from skilled therapeutic intervention in order to improve the following deficits and impairments:  Decreased activity tolerance, Decreased balance, Decreased endurance, Decreased strength, Difficulty walking, Dizziness, Impaired sensation, Postural dysfunction, Pain  Visit Diagnosis: Unsteadiness on feet  Muscle weakness (generalized)  Repeated falls  Difficulty in walking, not elsewhere classified  Abnormal posture     Problem List Patient Active Problem List   Diagnosis Date Noted  . Generalized weakness 07/05/2019  . Diarrhea 07/05/2019  . Fall at home, initial encounter 07/05/2019  . Port-A-Cath in place 02/23/2019  . Family history of colon cancer   . Personal history of colonic polyps   . History of MI (myocardial infarction) 01/22/2019  . Severe obesity ([place])   . PONV (postoperative nausea and vomiting)   . Iron deficiency anemia   . Hypertension   . Hyperlipidemia   . History of cardiomegaly   . GERD (gastroesophageal reflux disease)   . Cancer of right colon - resected 01/17/2019 01/17/2019  . CAD S/P percutaneous coronary angioplasty 07/10/2016  . Essential hypertension 02/28/2016  . Dyslipidemia, goal LDL below 70 02/28/2016    [place], [place], DPT  11/03/2019, 2:25 PM  [facility] [address] [place], [place],  [address] Phone: [phone]   Fax:  [phone]  Name: [name] MRN: [id number] Date of Birth: 1945/09/01

## 2019-11-10 ENCOUNTER — Other Ambulatory Visit: Payer: Self-pay

## 2019-11-10 ENCOUNTER — Ambulatory Visit: Payer: Medicare Other | Attending: Family Medicine | Admitting: Rehabilitative and Restorative Service Providers"

## 2019-11-10 ENCOUNTER — Encounter: Payer: Self-pay | Admitting: Rehabilitative and Restorative Service Providers"

## 2019-11-10 DIAGNOSIS — R2681 Unsteadiness on feet: Secondary | ICD-10-CM

## 2019-11-10 DIAGNOSIS — R209 Unspecified disturbances of skin sensation: Secondary | ICD-10-CM | POA: Diagnosis present

## 2019-11-10 DIAGNOSIS — R296 Repeated falls: Secondary | ICD-10-CM | POA: Diagnosis present

## 2019-11-10 DIAGNOSIS — M6281 Muscle weakness (generalized): Secondary | ICD-10-CM | POA: Diagnosis present

## 2019-11-10 DIAGNOSIS — R293 Abnormal posture: Secondary | ICD-10-CM | POA: Diagnosis present

## 2019-11-10 DIAGNOSIS — R262 Difficulty in walking, not elsewhere classified: Secondary | ICD-10-CM | POA: Diagnosis present

## 2019-11-10 NOTE — Therapy (Signed)
[facility] [address] [place], [place], [address] Phone: [phone]   Fax:  [phone]  Physical Therapy Treatment  Patient Details  Name: [name] MRN: [id number] Date of Birth: 1945-06-18 Referring Provider (PT): Referred by [facility] physician; certification will be sent to PCP - Chesley Noon, MD   Encounter Date: 11/10/2019  PT End of Session - 11/10/19 1626    Visit Number  17    Number of Visits  26    Date for PT Re-Evaluation  11/21/19    Authorization Type  [employer] $40 copay.  10th visit PN    Progress Note Due on Visit  20    PT Start Time  1318    PT Stop Time  1358    PT Time Calculation (min)  40 min    Equipment Utilized During Treatment  Gait belt    Activity Tolerance  Patient tolerated treatment well;Patient limited by fatigue    Behavior During Therapy  WFL for tasks assessed/performed       Past Medical History:  Diagnosis Date  . Cancer of right colon ([place])   . Constipation   . Coronary artery disease   . CVA (cerebral infarction) 1996  . Dyspnea    going up stairs and hills  . Family history of colon cancer   . GERD (gastroesophageal reflux disease)   . History of cardiomegaly   . Hyperlipidemia   . Hypertension   . Iron deficiency anemia   . Myocardial infarction ([place]) 2017  . NSTEMI (non-ST elevated myocardial infarction) ([place])   . Personal history of colonic polyps   . PONV (postoperative nausea and vomiting)   . Right shoulder pain    more painful at night , cold makes it worse  . Severe obesity ([place])     Past Surgical History:  Procedure Laterality Date  . ABDOMINAL HYSTERECTOMY    . BACK SURGERY     x2  . CARDIAC CATHETERIZATION N/A 02/28/2016   Procedure: Left Heart Cath and Coronary Angiography;  Surgeon: Peter M Martinique, MD;  Location: [facility];  Service: Cardiovascular;  Laterality: N/A;  . CARDIAC CATHETERIZATION N/A 02/28/2016   Procedure:  Coronary Stent Intervention;  Surgeon: Peter M Martinique, MD;  Location: [facility];  Service: Cardiovascular;  Laterality: N/A;  . CHOLECYSTECTOMY    . COLONOSCOPY  11/24/2018  . CORONARY ANGIOPLASTY    . CYST EXCISION Left    Cheek  . LAPAROSCOPIC RIGHT HEMI COLECTOMY Right 01/17/2019   Procedure: LAPAROSCOPIC RIGHT HEMI COLECTOMY;  Surgeon: Alphonsa Overall, MD;  [facility];  Service: General;  Laterality: Right;  . NASAL FRACTURE SURGERY    . PORTACATH PLACEMENT N/A 02/22/2019   Procedure: POWER PORT PLACEMENT;  Surgeon: Alphonsa Overall, MD;  Location: [facility];  Service: General;  Laterality: N/A;  . UPPER GI ENDOSCOPY  11/24/2018    There were no vitals filed for this visit.  Subjective Assessment - 11/10/19 1318    Subjective  Patient reports her HEP compliance has been fair. Denies any changes since her last visit. Denies any falls, but reports a couple of near falls when she was standing for "too long" in one spot.    Pertinent History  cancer of R colon with colectomy and chemotherapy, constipation, CAD, CVA, cardiomegaly, HLD, HTN, anemia, NSTEMI, obesity, chronic back pain after back surgery, and a history of motion sickness    Limitations  Standing;Walking;House hold activities  Patient Stated Goals  To get back on her feet and walk without feeling like she is going to fall - started last June after finding out she had colon CA - 18 inches removed + chemo.    Currently in Pain?  No/denies                        [facility] Adult PT Treatment/Exercise - 11/10/19 1322      Ambulation/Gait   Ambulation/Gait  Yes    Ambulation/Gait Assistance  5: Supervision;4: Min guard    Ambulation/Gait Assistance Details  supervision with rollator, superivsion - min guard without rollator (min guard utilized when distracted and turning without AD)    Ambulation Distance (Feet)  215 Feet   215 x 1, 115 x 1, 100 x 1    Assistive device  Rollator;None    Gait Pattern  Step-to  pattern;Step-through pattern;Decreased step length - right;Decreased step length - left;Decreased stride length;Decreased hip/knee flexion - right;Decreased hip/knee flexion - left;Decreased dorsiflexion - right;Decreased dorsiflexion - left;Decreased weight shift to left;Right flexed knee in stance;Left flexed knee in stance;Shuffle;Trunk flexed;Poor foot clearance - left;Poor foot clearance - right    Ambulation Surface  Level;Indoor    Gait Comments  In parallel bars (x 8 laps) gait training with no AD and no UE support - required cueing for lengthened step length and posture maintenance ("look ahead") - great improvement noted with practice around lap # 4  - progresed to gait no AD outside parallel bars in/around busy environment      Therapeutic Activites    Therapeutic Activities  Other Therapeutic Activities    Other Therapeutic Activities  Reviewed proper technique for placing and retrieving object (boom whacker) to/from the floor - cueing required to widen BOS and stand near to object when retrieving it to minimize excessive anterior lean. Improvement noted in use of Les in squat technique and safety due to increase in width of foot placement on final few repetitions.       Neuro Re-ed    Neuro Re-ed Details   Sit to stand  x 5 solid surface elevated mat with cueing for anterior weight shift and to minimize use of UEs to push up. Sit to stand with floor foam (1 inch foam) from elevated mat surface x 5 - PT required to provide manual and verbal cueing to foster appropriate anterior weight shift (defaults to heavy posterior lean). Requires multiple cues to prevent posterior LOB - twice experienced uncontrolled sit back to elevated mat table when fatigued. With elevated mat table behind, PT in front, on 1 inch foam pad static standing + unilateral reaching to find target (PT's hand) in multiple quadrants - progressed to BUE reaching x3 minutes, 2 reps requiring near constant tactile cueing fostering  anterior weight shift from hips with up to min A due to increase sway with fatigue to prevent LOB posteriorly             PT Education - 11/10/19 1626    Education Details  see therapeutic activity    Person(s) Educated  Patient    Methods  Explanation;Demonstration;Tactile cues;Verbal cues    Comprehension  Verbalized understanding;Returned demonstration;Need further instruction       PT Short Term Goals - 10/27/19 1406      PT SHORT TERM GOAL #1   Title  Pt will demonstrate independence with ongoing HEP    Baseline  MET 10/27/19    Time  4  Period  Weeks    Status  Achieved    Target Date  --      PT SHORT TERM GOAL #2   Title  Pt will improve LE strength as indicated by decrease in time to perform five time sit to stand to </= 18 seconds without use of UE    Baseline  10/27/19: without use of UEs = 23 seconds; with use of UEs = 16.87 seconds    Time  4    Period  Weeks    Status  Partially Met    Target Date  --      PT SHORT TERM GOAL #3   Title  Pt will improve gait velocity with LRAD to >/= 2.8 ft/sec without AD    Baseline  NOT MET 10/27/19: 2.88f/sec with rollator    Time  4    Period  Weeks    Status  Not Met    Target Date  --      PT SHORT TERM GOAL #4   Title  Pt will increase BERG to >/= 50/56 to indicate decreased falls risk    Baseline  NOT MET 10/27/19: berg balance test score = 46/56    Time  4    Period  Weeks    Status  Not Met    Target Date  --      PT SHORT TERM GOAL #5   Title  Pt will demonstrate ability to reach down to the ground multiple times with supervision without LOB    Baseline  MET 10/27/19: see TA note for details    Time  4    Period  Weeks    Status  Achieved    Target Date  --        PT Long Term Goals - 09/22/19 1715      PT LONG TERM GOAL #1   Title  Pt will be independent with final HEP for vestibular, balance and strengthening    Time  8    Period  Weeks    Status  Revised    Target Date  11/21/19      PT  LONG TERM GOAL #2   Title  Will report 10 point further decrease in [place]   Baseline  84 severe > 62 (22 point decrease)    Time  8    Period  Weeks    Status  Revised    Target Date  11/21/19      PT LONG TERM GOAL #3   Title  Pt will increase gait velocity without AD to 3.0 ft/sec    Baseline  1.7 > 2.5 ft/sec    Time  8    Period  Weeks    Status  Revised    Target Date  11/21/19      PT LONG TERM GOAL #4   Title  Pt will increase BERG to >/= 52/56 to indicate decreased risk for falls    Time  8    Period  Weeks    Status  Revised    Target Date  11/21/19      PT LONG TERM GOAL #5   Title  Pt will decrease time to perform five time sit to stand to </= 16 seconds without use of UE    Baseline  22 seconds with UE use; 20 seconds but without UE use    Time  8    Period  Weeks    Status  Revised    Target Date  11/21/19      PT LONG TERM GOAL #6   Title  Pt will report ability to participate in MRADL - cooking, laundry, cleaning - with supervision of husband or daughter and will report improvement in function on FOTO to >/= 62%    Baseline  28% function > 58% function, is doing laundry but not cooking or cleaning    Time  8    Period  Weeks    Status  On-going    Target Date  11/21/19            Plan - 11/10/19 1629    Clinical Impression Statement  Today's skilled session focused on progression of gait training without an AD and higher-level balance challenges on compliant surfaces fostering an increase in reliance upon vestibular input in the maintenance of her balance, which challenges her. She tolerated all of the above well demonstrating marked improvement in gait without an AD following today's interventions and instruction. She is making progress and will benefit from continued skilled PT to decrease fall risk.    Personal Factors and Comorbidities  Comorbidity 3+;Finances;Fitness;Transportation    Comorbidities  cancer of R colon with colectomy and chemotherapy,  constipation, CAD, CVA, cardiomegaly, HLD, HTN, anemia, NSTEMI, obesity, chronic back pain after back surgery, and a history of motion sickness    Examination-Activity Limitations  Bend;Locomotion Level;Stairs;Stand    Examination-Participation Restrictions  Cleaning;Laundry;Meal Prep    Stability/Clinical Decision Making  Evolving/Moderate complexity    Rehab Potential  Good    PT Frequency  2x / week    PT Duration  8 weeks    PT Treatment/Interventions  ADLs/Self Care Home Management;Canalith Repostioning;Cryotherapy;Moist Heat;DME Instruction;Gait training;Stair training;Functional mobility training;Therapeutic activities;Therapeutic exercise;Balance training;Neuromuscular re-education;Patient/family education;Orthotic Fit/Training;Vestibular    PT Next Visit Plan  Higher level gait training without AD, making turns with fewer steps, compliant surfaces/outside on pavement and gravel, walking backwards and side stepping, around and over obstacles, stairs. Work on functional standing tolerance/endurance - laundry, dishes, sweeping, vac.    PT Home Exercise Plan  [id number]    Consulted and Agree with Plan of Care  Patient       Patient will benefit from skilled therapeutic intervention in order to improve the following deficits and impairments:  Decreased activity tolerance, Decreased balance, Decreased endurance, Decreased strength, Difficulty walking, Dizziness, Impaired sensation, Postural dysfunction, Pain  Visit Diagnosis: Unsteadiness on feet  Muscle weakness (generalized)  Repeated falls  Difficulty in walking, not elsewhere classified  Abnormal posture     Problem List Patient Active Problem List   Diagnosis Date Noted  . Generalized weakness 07/05/2019  . Diarrhea 07/05/2019  . Fall at home, initial encounter 07/05/2019  . Port-A-Cath in place 02/23/2019  . Family history of colon cancer   . Personal history of colonic polyps   . History of MI (myocardial infarction)  01/22/2019  . Severe obesity ([place])   . PONV (postoperative nausea and vomiting)   . Iron deficiency anemia   . Hypertension   . Hyperlipidemia   . History of cardiomegaly   . GERD (gastroesophageal reflux disease)   . Cancer of right colon - resected 01/17/2019 01/17/2019  . CAD S/P percutaneous coronary angioplasty 07/10/2016  . Essential hypertension 02/28/2016  . Dyslipidemia, goal LDL below 70 02/28/2016    [place] 11/10/2019, 4:31 PM  [facility] [address] [place], [place], [address] Phone: [phone]   Fax:  [phone]  Name: [name] MRN: [id number] Date of Birth: [birth year]

## 2019-11-17 ENCOUNTER — Encounter: Payer: Self-pay | Admitting: Physical Therapy

## 2019-11-17 ENCOUNTER — Ambulatory Visit: Payer: Medicare Other | Admitting: Physical Therapy

## 2019-11-17 ENCOUNTER — Other Ambulatory Visit: Payer: Self-pay

## 2019-11-17 DIAGNOSIS — R2681 Unsteadiness on feet: Secondary | ICD-10-CM | POA: Diagnosis not present

## 2019-11-17 DIAGNOSIS — R262 Difficulty in walking, not elsewhere classified: Secondary | ICD-10-CM

## 2019-11-17 DIAGNOSIS — R296 Repeated falls: Secondary | ICD-10-CM

## 2019-11-17 DIAGNOSIS — R293 Abnormal posture: Secondary | ICD-10-CM

## 2019-11-17 DIAGNOSIS — M6281 Muscle weakness (generalized): Secondary | ICD-10-CM

## 2019-11-17 DIAGNOSIS — R208 Other disturbances of skin sensation: Secondary | ICD-10-CM

## 2019-11-17 NOTE — Therapy (Signed)
[facility] [address] [place], [place], [address] Phone: [phone]   Fax:  [phone]  Physical Therapy Treatment  Patient Details  Name: [name] MRN: [id number] Date of Birth: 10-Aug-1945 Referring Provider (PT): Referred by [facility] physician; certification will be sent to PCP - Chesley Noon, MD   Encounter Date: 11/17/2019   PT End of Session - 11/17/19 1647    Visit Number 18    Number of Visits 21    Date for PT Re-Evaluation 12/08/19    Authorization Type [employer] $40 copay.  10th visit PN    Progress Note Due on Visit 20    PT Start Time 1450    PT Stop Time 1533    PT Time Calculation (min) 43 min    Activity Tolerance Patient tolerated treatment well    Behavior During Therapy WFL for tasks assessed/performed           Past Medical History:  Diagnosis Date  . Cancer of right colon ([place])   . Constipation   . Coronary artery disease   . CVA (cerebral infarction) 1996  . Dyspnea    going up stairs and hills  . Family history of colon cancer   . GERD (gastroesophageal reflux disease)   . History of cardiomegaly   . Hyperlipidemia   . Hypertension   . Iron deficiency anemia   . Myocardial infarction ([place]) 2017  . NSTEMI (non-ST elevated myocardial infarction) ([place])   . Personal history of colonic polyps   . PONV (postoperative nausea and vomiting)   . Right shoulder pain    more painful at night , cold makes it worse  . Severe obesity ([place])     Past Surgical History:  Procedure Laterality Date  . ABDOMINAL HYSTERECTOMY    . BACK SURGERY     x2  . CARDIAC CATHETERIZATION N/A 02/28/2016   Procedure: Left Heart Cath and Coronary Angiography;  Surgeon: Peter M Martinique, MD;  Location: [facility];  Service: Cardiovascular;  Laterality: N/A;  . CARDIAC CATHETERIZATION N/A 02/28/2016   Procedure: Coronary Stent Intervention;  Surgeon: Peter M Martinique, MD;  Location: [facility];  Service: Cardiovascular;  Laterality: N/A;  . CHOLECYSTECTOMY    . COLONOSCOPY  11/24/2018  . CORONARY ANGIOPLASTY    . CYST EXCISION Left    Cheek  . LAPAROSCOPIC RIGHT HEMI COLECTOMY Right 01/17/2019   Procedure: LAPAROSCOPIC RIGHT HEMI COLECTOMY;  Surgeon: Alphonsa Overall, MD;  Location: [facility];  Service: General;  Laterality: Right;  . NASAL FRACTURE SURGERY    . PORTACATH PLACEMENT N/A 02/22/2019   Procedure: POWER PORT PLACEMENT;  Surgeon: Alphonsa Overall, MD;  Location: [facility];  Service: General;  Laterality: N/A;  . UPPER GI ENDOSCOPY  11/24/2018    There were no vitals filed for this visit.   Subjective Assessment - 11/17/19 1456    Subjective Pt reports she is trying to do more housework, was able to mop the kitchen which she hadn't done in a long time.  Fatigue is still limiting.  Feels like she does better here than at home.  Still having good days and bad days.  Not using the walker at home.    Pertinent History cancer of R colon with colectomy and chemotherapy, constipation, CAD, CVA, cardiomegaly, HLD, HTN, anemia, NSTEMI, obesity, chronic back pain after back surgery, and a history of motion sickness    Limitations Standing;Walking;House hold activities    Patient  Stated Goals To get back on her feet and walk without feeling like she is going to fall - started last June after finding out she had colon CA - 18 inches removed + chemo.              [facility] PT Assessment - 11/17/19 1500      Assessment   Medical Diagnosis Dizziness and deconditioning after Colon cancer/chemo    Referring Provider (PT) Referred by [facility] physician; certification will be sent to PCP - Chesley Noon, MD    Onset Date/Surgical Date 07/07/19    Prior Therapy yes      Precautions   Precautions Other (comment)    Precaution Comments cancer of R colon with colectomy and chemotherapy, constipation, CAD, CVA, cardiomegaly, HLD, HTN, anemia, NSTEMI, obesity, chronic back pain after back  surgery, and a history of motion sickness      Prior Function   Level of Independence Independent      Observation/Other Assessments   Focus on Therapeutic Outcomes (FOTO)  Reports 58% function (improved from 28%)    Other Surveys  Dizziness Handicap Inventory (DHI)    Dizziness Handicap Inventory ([place])  62%      Standardized Balance Assessment   Standardized Balance Assessment Berg Balance Test;Five Times Sit to Stand;10 meter walk test    Five times sit to stand comments  15 seconds from mat without use of UE    10 Meter Walk 11.84 seconds without AD with supervision - 2.77 ft/sec      Berg Balance Test   Sit to Stand Able to stand without using hands and stabilize independently    Standing Unsupported Able to stand safely 2 minutes    Sitting with Back Unsupported but Feet Supported on Floor or Stool Able to sit safely and securely 2 minutes    Stand to Sit Sits safely with minimal use of hands    Transfers Able to transfer safely, minor use of hands    Standing Unsupported with Eyes Closed Able to stand 10 seconds safely    Standing Unsupported with Feet Together Able to place feet together independently and stand 1 minute safely    From Standing, Reach Forward with Outstretched Arm Can reach forward >12 cm safely (5")    From Standing Position, Pick up Object from Floor Able to pick up shoe safely and easily    From Standing Position, Turn to Look Behind Over each Shoulder Looks behind from both sides and weight shifts well    Turn 360 Degrees Able to turn 360 degrees safely but slowly    Standing Unsupported, Alternately Place Feet on Step/Stool Able to complete 4 steps without aid or supervision    Standing Unsupported, One Foot in Front Able to plae foot ahead of the other independently and hold 30 seconds    Standing on One Leg Tries to lift leg/unable to hold 3 seconds but remains standing independently    Total Score 47    Berg comment: 47/56 still moderate falls risk                          [facility] Adult PT Treatment/Exercise - 11/17/19 1527      Therapeutic Activites    Therapeutic Activities Other Therapeutic Activities    Other Therapeutic Activities Discussed D/C in 3 visits and ways for pt to maintain gains made in therapy and to continue to progress outside of therapy.  Pt's main goals  are to do more household chores and activities.  Discussed ways for pt to gradually progress ability to help with cooking at home: being responsible for making all her breakfasts and all her lunches or assisting daughter with one part of the evening meal and gradually adding on to that.  Also discussed ways to conserve energy during cooking including sitting down to chop or having a stool to use when stirring or mixing items.                    PT Education - 11/17/19 1643    Education Details progress towards goals; plan to recert for final 3 visits and then D/C; see TA    Person(s) Educated Patient    Methods Explanation    Comprehension Verbalized understanding            PT Short Term Goals - 10/27/19 1406      PT SHORT TERM GOAL #1   Title Pt will demonstrate independence with ongoing HEP    Baseline MET 10/27/19    Time 4    Period Weeks    Status Achieved    Target Date --      PT SHORT TERM GOAL #2   Title Pt will improve LE strength as indicated by decrease in time to perform five time sit to stand to </= 18 seconds without use of UE    Baseline 10/27/19: without use of UEs = 23 seconds; with use of UEs = 16.87 seconds    Time 4    Period Weeks    Status Partially Met    Target Date --      PT SHORT TERM GOAL #3   Title Pt will improve gait velocity with LRAD to >/= 2.8 ft/sec without AD    Baseline NOT MET 10/27/19: 2.52f/sec with rollator    Time 4    Period Weeks    Status Not Met    Target Date --      PT SHORT TERM GOAL #4   Title Pt will increase BERG to >/= 50/56 to indicate decreased falls risk    Baseline NOT MET  10/27/19: berg balance test score = 46/56    Time 4    Period Weeks    Status Not Met    Target Date --      PT SHORT TERM GOAL #5   Title Pt will demonstrate ability to reach down to the ground multiple times with supervision without LOB    Baseline MET 10/27/19: see TA note for details    Time 4    Period Weeks    Status Achieved    Target Date --             PT Long Term Goals - 11/17/19 1514      PT LONG TERM GOAL #1   Title Pt will be independent with final HEP for vestibular, balance and strengthening    Time 8    Period Weeks    Status Partially Met      PT LONG TERM GOAL #2   Title Will report 10 point further decrease in [place]   Baseline 84 severe > 62 (22 point decrease)    Time 8    Period Weeks    Status On-going      PT LONG TERM GOAL #3   Title Pt will increase gait velocity without AD to 3.0 ft/sec    Baseline 1.7 > 2.5 ft/sec >  2.7 ft/sec without AD    Time 8    Period Weeks    Status Partially Met      PT LONG TERM GOAL #4   Title Pt will increase BERG to >/= 52/56 to indicate decreased risk for falls    Baseline 47/56    Time 8    Period Weeks    Status Partially Met      PT LONG TERM GOAL #5   Title Pt will decrease time to perform five time sit to stand to </= 16 seconds without use of UE    Baseline 22 seconds with UE use; 20 seconds but without UE use > 15 seconds without use of UE    Time 8    Period Weeks    Status Achieved      PT LONG TERM GOAL #6   Title Pt will report ability to participate in MRADL - cooking, laundry, cleaning - with supervision of husband or daughter and will report improvement in function on FOTO to >/= 62%    Baseline Is doing some light cleaning and laundry but hasn't attempted any cooking yet    Time 8    Period Weeks    Status Partially Met          New goals for recertification:  PT Short Term Goals - 11/17/19 1655      PT SHORT TERM GOAL #1   Title = LTG           PT Long Term Goals - 11/17/19  1655      PT LONG TERM GOAL #1   Title Pt will be independent with final HEP for vestibular, balance and strengthening    Time 3    Period Weeks    Status Revised    Target Date 12/08/19      PT LONG TERM GOAL #2   Title Will report 10 point further decrease in [place]    Baseline 84 severe > 62 (22 point decrease)    Time 3    Period Weeks    Status Revised    Target Date 12/08/19      PT LONG TERM GOAL #3   Title Pt will increase gait velocity without AD to 3.0 ft/sec    Baseline 1.7 > 2.5 ft/sec > 2.7 ft/sec without AD    Time 3    Period Weeks    Status Revised    Target Date 12/08/19      PT LONG TERM GOAL #4   Title Pt will increase BERG to >/= 52/56 to indicate decreased risk for falls    Baseline 47/56    Time 3    Period Weeks    Status Revised    Target Date 12/08/19      PT LONG TERM GOAL #5   Title Pt will report ability to participate in MRADL - cooking, laundry, cleaning - with supervision of husband or daughter and will report improvement in function on FOTO to >/= 62%    Baseline Is doing some light cleaning and laundry but hasn't attempted any cooking yet    Time 3    Period Weeks    Status Revised    Target Date 12/08/19                Plan - 11/17/19 1532    Clinical Impression Statement Pt continues to make slow but steady progress.  She met 1/6 LTG, partially meeting all other goals.  She  is demonstrating improvements in LE functional strength, standing balance, gait with decreased UE support, and increased gait velocity.  She continues to be a moderate risk for falls and continues to be less compliant with exercises and self progressing with household activities due to ongoing fatigue and fear of falling.  Discussed ways for pt to practice energy conservation while gradually performing more parts of a specific tasks or more tasks overall.  Will recert for 3 more visits and then plan to D/C with pt self progressing activities at home.  Pt agreeable  to plan.    Personal Factors and Comorbidities Comorbidity 3+;Finances;Fitness;Transportation    Comorbidities cancer of R colon with colectomy and chemotherapy, constipation, CAD, CVA, cardiomegaly, HLD, HTN, anemia, NSTEMI, obesity, chronic back pain after back surgery, and a history of motion sickness    Examination-Activity Limitations [place];Locomotion Level;Stairs;Stand    Examination-Participation Restrictions Cleaning;Laundry;Meal Prep    Stability/Clinical Decision Making Evolving/Moderate complexity    Rehab Potential Good    PT Frequency 1x / week    PT Duration 3 weeks    PT Treatment/Interventions ADLs/Self Care Home Management;Canalith Repostioning;Cryotherapy;Moist Heat;DME Instruction;Gait training;Stair training;Functional mobility training;Therapeutic activities;Therapeutic exercise;Balance training;Neuromuscular re-education;Patient/family education;Orthotic Fit/Training;Vestibular    PT Next Visit Plan Simulate home tasks - mop, sweep, dishes, cook, etc.  Functional reaching forwards and down to floor.  Higher level gait training without AD, making turns with fewer steps, compliant surfaces/outside on pavement and gravel, walking backwards and side stepping, around and over obstacles, stairs. Work on functional standing tolerance/endurance - laundry, dishes, sweeping, vac.    PT Home Exercise Plan [id number]    Consulted and Agree with Plan of Care Patient           Patient will benefit from skilled therapeutic intervention in order to improve the following deficits and impairments:  Decreased activity tolerance, Decreased balance, Decreased endurance, Decreased strength, Difficulty walking, Dizziness, Impaired sensation, Postural dysfunction, Pain  Visit Diagnosis: Unsteadiness on feet  Muscle weakness (generalized)  Repeated falls  Difficulty in walking, not elsewhere classified  Abnormal posture  Other disturbances of skin sensation     Problem List Patient  Active Problem List   Diagnosis Date Noted  . Generalized weakness 07/05/2019  . Diarrhea 07/05/2019  . Fall at home, initial encounter 07/05/2019  . Port-A-Cath in place 02/23/2019  . Family history of colon cancer   . Personal history of colonic polyps   . History of MI (myocardial infarction) 01/22/2019  . Severe obesity ([place])   . PONV (postoperative nausea and vomiting)   . Iron deficiency anemia   . Hypertension   . Hyperlipidemia   . History of cardiomegaly   . GERD (gastroesophageal reflux disease)   . Cancer of right colon - resected 01/17/2019 01/17/2019  . CAD S/P percutaneous coronary angioplasty 07/10/2016  . Essential hypertension 02/28/2016  . Dyslipidemia, goal LDL below 70 02/28/2016    Rico Junker, PT, DPT 11/17/19    4:55 PM    [facility] [address] [place], [place], [address] Phone: [phone]   Fax:  [phone]  Name: Houda Brau MRN: [id number] Date of Birth: [birth year]

## 2019-11-24 ENCOUNTER — Other Ambulatory Visit: Payer: Self-pay

## 2019-11-24 ENCOUNTER — Ambulatory Visit: Payer: Medicare Other

## 2019-11-24 DIAGNOSIS — R262 Difficulty in walking, not elsewhere classified: Secondary | ICD-10-CM

## 2019-11-24 DIAGNOSIS — R2681 Unsteadiness on feet: Secondary | ICD-10-CM | POA: Diagnosis not present

## 2019-11-24 DIAGNOSIS — M6281 Muscle weakness (generalized): Secondary | ICD-10-CM

## 2019-11-24 NOTE — Therapy (Signed)
[facility] [address] [place], [place], [address] Phone: [phone]   Fax:  [phone]  Physical Therapy Treatment  Patient Details  Name: [name] MRN: [id number] Date of Birth: [birth year] Referring Provider (PT): Referred by [facility] physician; certification will be sent to PCP - Chesley Noon, MD   Encounter Date: 11/24/2019   PT End of Session - 11/24/19 1456    Visit Number 19    Number of Visits 21    Date for PT Re-Evaluation 12/08/19    Authorization Type [employer] $40 copay.  10th visit PN    Progress Note Due on Visit 20    PT Start Time 1452    PT Stop Time 1530    PT Time Calculation (min) 38 min    Equipment Utilized During Treatment Gait belt    Activity Tolerance Patient tolerated treatment well    Behavior During Therapy WFL for tasks assessed/performed           Past Medical History:  Diagnosis Date  . Cancer of right colon ([place])   . Constipation   . Coronary artery disease   . CVA (cerebral infarction) 1996  . Dyspnea    going up stairs and hills  . Family history of colon cancer   . GERD (gastroesophageal reflux disease)   . History of cardiomegaly   . Hyperlipidemia   . Hypertension   . Iron deficiency anemia   . Myocardial infarction ([place]) 2017  . NSTEMI (non-ST elevated myocardial infarction) ([place])   . Personal history of colonic polyps   . PONV (postoperative nausea and vomiting)   . Right shoulder pain    more painful at night , cold makes it worse  . Severe obesity ([place])     Past Surgical History:  Procedure Laterality Date  . ABDOMINAL HYSTERECTOMY    . BACK SURGERY     x2  . CARDIAC CATHETERIZATION N/A 02/28/2016   Procedure: Left Heart Cath and Coronary Angiography;  Surgeon: Peter M Martinique, MD;  Location: [facility];  Service: Cardiovascular;  Laterality: N/A;  . CARDIAC CATHETERIZATION N/A 02/28/2016   Procedure: Coronary Stent Intervention;   Surgeon: Peter M Martinique, MD;  Location: [facility];  Service: Cardiovascular;  Laterality: N/A;  . CHOLECYSTECTOMY    . COLONOSCOPY  11/24/2018  . CORONARY ANGIOPLASTY    . CYST EXCISION Left    Cheek  . LAPAROSCOPIC RIGHT HEMI COLECTOMY Right 01/17/2019   Procedure: LAPAROSCOPIC RIGHT HEMI COLECTOMY;  Surgeon: Alphonsa Overall, MD;  [facility];  Service: General;  Laterality: Right;  . NASAL FRACTURE SURGERY    . PORTACATH PLACEMENT N/A 02/22/2019   Procedure: POWER PORT PLACEMENT;  Surgeon: Alphonsa Overall, MD;  Location: [facility];  Service: General;  Laterality: N/A;  . UPPER GI ENDOSCOPY  11/24/2018    There were no vitals filed for this visit.   Subjective Assessment - 11/24/19 1456    Subjective Pt reports that she did have a fall at home last Saturday outside. She was walking up her gravel driveway and went to step on first step and went straight backwards. She hit her back/hip and posterior head. Reports she had a mild headache with a little redness. She had slight headache that day and a little nausea but nothing since then. She reports left hip was a little sore as well but doing fine right now.    Pertinent History cancer of R colon with colectomy and  chemotherapy, constipation, CAD, CVA, cardiomegaly, HLD, HTN, anemia, NSTEMI, obesity, chronic back pain after back surgery, and a history of motion sickness    Limitations Standing;Walking;House hold activities    Patient Stated Goals To get back on her feet and walk without feeling like she is going to fall - started last June after finding out she had colon CA - 18 inches removed + chemo.    Currently in Pain? No/denies                             [facility] Adult PT Treatment/Exercise - 11/24/19 1500      Transfers   Transfers Sit to Stand;Stand to Sit    Sit to Stand 6: Modified independent (Device/Increase time)    Stand to Sit 6: Modified independent (Device/Increase time)      Ambulation/Gait    Ambulation/Gait Yes    Ambulation/Gait Assistance 5: Supervision;4: Min guard    Ambulation/Gait Assistance Details Pt was cued to try to increase step length. Was able to do this some and increase cadence during second half.    Ambulation Distance (Feet) 230 Feet   230' x 1   Assistive device None    Gait Pattern Step-through pattern;Decreased step length - right;Decreased step length - left    Stairs Yes    Stairs Assistance 4: Min guard;4: Min assist    Stairs Assistance Details (indicate cue type and reason) Pt performed reciprocal pattern up and step-to down. On first bout held to right railing and with descent she turned more to side. 2nd 4 steps utilized 1 hand held assist with therapist behind going up and 1 hand held assist with touching rail at times with descent as patient feeling less steady with descent coming forward.      Therapeutic Activites    Therapeutic Activities Other Therapeutic Activities    Other Therapeutic Activities Pt reports that she has been trying to do a little more cooking for small meals. Did a little sweeping. Was also able to bend over to pick a couple small things off floor.  Pt reports that if she feels a little lightheaded she does sit down right away. Does feel that she is doing better being able to do a little more. Still breaking up activities as instructed. She is also modifying activities like laundry bringing in to bedroom and sitting on bed to fold.      Neuro Re-ed    Neuro Re-ed Details  Standing in front of mat: side stepping between 2 tables separated about 6 feet grabbing clip off one table and then placing it on pole on other table reaching up and across x 9. Picking up 3 clips off floor and handing to therapist each time with verbal cues to get close and use knees to help when going down. Tapping 3 different colored cones in front on command x 6 on right and x 6 on left min assist and verbal cues to tighten bottom on stance leg.                   PT Education - 11/24/19 2014    Education Details Pt was instructed to continue to monitor head if she develops any headaches, confusion, nausea to get it checked out further. Nothing noted at session today or since day of fall. Advised not to try her stairs on her own.    Person(s) Educated Patient    Methods Explanation  Comprehension Verbalized understanding            PT Short Term Goals - 11/17/19 1655      PT SHORT TERM GOAL #1   Title = LTG             PT Long Term Goals - 11/17/19 1655      PT LONG TERM GOAL #1   Title Pt will be independent with final HEP for vestibular, balance and strengthening    Time 3    Period Weeks    Status Revised    Target Date 12/08/19      PT LONG TERM GOAL #2   Title Will report 10 point further decrease in [place]    Baseline 84 severe > 62 (22 point decrease)    Time 3    Period Weeks    Status Revised    Target Date 12/08/19      PT LONG TERM GOAL #3   Title Pt will increase gait velocity without AD to 3.0 ft/sec    Baseline 1.7 > 2.5 ft/sec > 2.7 ft/sec without AD    Time 3    Period Weeks    Status Revised    Target Date 12/08/19      PT LONG TERM GOAL #4   Title Pt will increase BERG to >/= 52/56 to indicate decreased risk for falls    Baseline 47/56    Time 3    Period Weeks    Status Revised    Target Date 12/08/19      PT LONG TERM GOAL #5   Title Pt will report ability to participate in [facility]AD[facility] - cooking, laundry, cleaning - with supervision of husband or daughter and will report improvement in function on FOTO to >/= 62%    Baseline Is doing some light cleaning and laundry but hasn't attempted any cooking yet    Time 3    Period Weeks    Status Revised    Target Date 12/08/19                 Plan - 11/24/19 2016    Clinical Impression Statement PT continued to focus on improving activity tolerance and balance activities simulating household tasks. Rest breaks provided throughout  session. Pt able to increase distance of gait bouts. She was challenged with less UE support with increasing SLS time. Practiced stairs as this is where pt had fall over the weekend. She has no rail on stairs and is "crawling" up/down. Needs assistance to perform maintaining upright.    Personal Factors and Comorbidities Comorbidity 3+;Finances;Fitness;Transportation    Comorbidities cancer of R colon with colectomy and chemotherapy, constipation, CAD, CVA, cardiomegaly, HLD, HTN, anemia, NSTEMI, obesity, chronic back pain after back surgery, and a history of motion sickness    Examination-Activity Limitations Bend;Locomotion Level;Stairs;Stand    Examination-Participation Restrictions Cleaning;Laundry;Meal Prep    Stability/Clinical Decision Making Evolving/Moderate complexity    Rehab Potential Good    PT Frequency 1x / week    PT Duration 3 weeks    PT Treatment/Interventions ADLs/Self Care Home Management;Canalith Repostioning;Cryotherapy;Moist Heat;DME Instruction;Gait training;Stair training;Functional mobility training;Therapeutic activities;Therapeutic exercise;Balance training;Neuromuscular re-education;Patient/family education;Orthotic Fit/Training;Vestibular    PT Next Visit Plan 10th visit progress note to [facility]. Practice stairs again. She does not have rails on her 3 steps to enter home and had recent fall trying to step to first step by herself. Simulate home tasks - mop, sweep, dishes, cook, etc.  Functional reaching forwards and down to  floor.  Higher level gait training without AD, making turns with fewer steps, compliant surfaces/outside on pavement and gravel, walking backwards and side stepping, around and over obstacles. Primary PT does plan to d/c in 2 visits.    PT Home Exercise Plan [id number]    Consulted and Agree with Plan of Care Patient           Patient will benefit from skilled therapeutic intervention in order to improve the following deficits and impairments:   Decreased activity tolerance, Decreased balance, Decreased endurance, Decreased strength, Difficulty walking, Dizziness, Impaired sensation, Postural dysfunction, Pain  Visit Diagnosis: Muscle weakness (generalized)  Difficulty in walking, not elsewhere classified  Unsteadiness on feet     Problem List Patient Active Problem List   Diagnosis Date Noted  . Generalized weakness 07/05/2019  . Diarrhea 07/05/2019  . Fall at home, initial encounter 07/05/2019  . Port-A-Cath in place 02/23/2019  . Family history of colon cancer   . Personal history of colonic polyps   . History of MI (myocardial infarction) 01/22/2019  . Severe obesity ([place])   . PONV (postoperative nausea and vomiting)   . Iron deficiency anemia   . Hypertension   . Hyperlipidemia   . History of cardiomegaly   . GERD (gastroesophageal reflux disease)   . Cancer of right colon - resected 01/17/2019 01/17/2019  . CAD S/P percutaneous coronary angioplasty 07/10/2016  . Essential hypertension 02/28/2016  . Dyslipidemia, goal LDL below 70 02/28/2016    Electa Sniff, PT, DPT, NCS 11/24/2019, 8:24 PM  [facility] [address] [place], [place], [address] Phone: [phone]   Fax:  [phone]  Name: Ashunti Schofield MRN: [id number] Date of Birth: 11/07/1945

## 2019-12-01 ENCOUNTER — Other Ambulatory Visit: Payer: Self-pay

## 2019-12-01 ENCOUNTER — Encounter: Payer: Self-pay | Admitting: Physical Therapy

## 2019-12-01 ENCOUNTER — Ambulatory Visit: Payer: Medicare Other | Admitting: Physical Therapy

## 2019-12-01 DIAGNOSIS — R296 Repeated falls: Secondary | ICD-10-CM

## 2019-12-01 DIAGNOSIS — R262 Difficulty in walking, not elsewhere classified: Secondary | ICD-10-CM

## 2019-12-01 DIAGNOSIS — R2681 Unsteadiness on feet: Secondary | ICD-10-CM | POA: Diagnosis not present

## 2019-12-01 DIAGNOSIS — M6281 Muscle weakness (generalized): Secondary | ICD-10-CM

## 2019-12-01 NOTE — Therapy (Addendum)
[facility] [address] [place], [place], [address] Phone: [phone]   Fax:  [phone]  Physical Therapy Treatment and 10th visit Progress Note  Patient Details  Name: [name] MRN: [id number] Date of Birth: [birth year] Referring Provider (PT): Referred by [facility] physician; certification will be sent to PCP - Chesley Noon, MD   Encounter Date: 12/01/2019   Progress Note Reporting Period 09/22/2019 to 12/01/2019  See note below for Objective Data and Assessment of Progress/Goals.   Pt is making progress towards goals; plan is to perform final assessment of LTG and D/C at next session.    Rico Junker, PT, DPT 12/06/19    11:36 AM       PT End of Session - 12/01/19 1456    Visit Number 20    Number of Visits 21    Date for PT Re-Evaluation 12/08/19    Authorization Type [employer] $40 copay.  10th visit PN    Progress Note Due on Visit 20    PT Start Time 0250    PT Stop Time 0334    PT Time Calculation (min) 44 min    Equipment Utilized During Treatment Gait belt    Activity Tolerance Patient tolerated treatment well    Behavior During Therapy WFL for tasks assessed/performed           Past Medical History:  Diagnosis Date  . Cancer of right colon ([place])   . Constipation   . Coronary artery disease   . CVA (cerebral infarction) 1996  . Dyspnea    going up stairs and hills  . Family history of colon cancer   . GERD (gastroesophageal reflux disease)   . History of cardiomegaly   . Hyperlipidemia   . Hypertension   . Iron deficiency anemia   . Myocardial infarction ([place]) 2017  . NSTEMI (non-ST elevated myocardial infarction) ([place])   . Personal history of colonic polyps   . PONV (postoperative nausea and vomiting)   . Right shoulder pain    more painful at night , cold makes it worse  . Severe obesity ([place])     Past Surgical History:  Procedure Laterality Date  . ABDOMINAL  HYSTERECTOMY    . BACK SURGERY     x2  . CARDIAC CATHETERIZATION N/A 02/28/2016   Procedure: Left Heart Cath and Coronary Angiography;  Surgeon: Peter M Martinique, MD;  Location: [facility];  Service: Cardiovascular;  Laterality: N/A;  . CARDIAC CATHETERIZATION N/A 02/28/2016   Procedure: Coronary Stent Intervention;  Surgeon: Peter M Martinique, MD;  Location: [facility];  Service: Cardiovascular;  Laterality: N/A;  . CHOLECYSTECTOMY    . COLONOSCOPY  11/24/2018  . CORONARY ANGIOPLASTY    . CYST EXCISION Left    Cheek  . LAPAROSCOPIC RIGHT HEMI COLECTOMY Right 01/17/2019   Procedure: LAPAROSCOPIC RIGHT HEMI COLECTOMY;  Surgeon: Alphonsa Overall, MD;  [facility];  Service: General;  Laterality: Right;  . NASAL FRACTURE SURGERY    . PORTACATH PLACEMENT N/A 02/22/2019   Procedure: POWER PORT PLACEMENT;  Surgeon: Alphonsa Overall, MD;  Location: [facility];  Service: General;  Laterality: N/A;  . UPPER GI ENDOSCOPY  11/24/2018    There were no vitals filed for this visit.   Subjective Assessment - 12/01/19 1453    Subjective Patient reports that she is feeling fine today and has no complaints of pain. No new falls. Pt. states that she has mentioned getting a  handrail on her stairs to her husband but he cannot put one up right now. Patient states that she has been "crawling" up her stairs to get into her house.    Pertinent History cancer of R colon with colectomy and chemotherapy, constipation, CAD, CVA, cardiomegaly, HLD, HTN, anemia, NSTEMI, obesity, chronic back pain after back surgery, and a history of motion sickness    Limitations Standing;Walking;House hold activities    Patient Stated Goals To get back on her feet and walk without feeling like she is going to fall - started last June after finding out she had colon CA - 18 inches removed + chemo.    Currently in Pain? No/denies    Multiple Pain Sites No              [facility] PT Assessment - 12/01/19 1458      Standardized  Balance Assessment   10 Meter Walk 15.13 seconds without AD with supervision - 2.14 ft/sec                 [facility] Adult PT Treatment/Exercise - 12/01/19 1458      Transfers   Sit to Stand --    Stand to Sit --      Ambulation/Gait   Ambulation/Gait Yes    Ambulation/Gait Assistance 5: Supervision;4: Min guard    Ambulation/Gait Assistance Details Pt. was cued to look up and maintain uright posture as well as to increase step length.    Ambulation Distance (Feet) 230 Feet   x1; 115 ft x1   Assistive device None    Gait Pattern Step-through pattern;Decreased arm swing - right;Decreased arm swing - left;Decreased step length - right;Decreased step length - left;Decreased stride length;Trunk flexed    Ambulation Surface Level;Indoor    Stairs Yes    Stairs Assistance 4: Min assist;4: Min guard    Stairs Assistance Details (indicate cue type and reason) Pt. performed stairs with 2 rails and a reciprocal pattern when ascending and a step to pattern descending; pt. then performed stairs with 1 rail and same gait pattern as previous; pt. then was given a demo of how to use a cane in her R hand and was able to ascend 1 step before grabbing the rail with her L UE out of fear; pt. also performed stairs sideways with both UE's on 1 rail and tolerated this well.    Stair Management Technique Forwards;With cane;Two rails;One rail Right;Alternating pattern;Step to pattern;Sideways    Number of Stairs 4 x3 reps   Height of Stairs 6      Self-Care   Self-Care Other Self-Care Comments    Other Self-Care Comments  Addressed handrails being added at the pt's home but the pt. reported that her husband was not able to do this right now due to other issues; pt. reported that she doesn't have anyone else that she feels comfortable asking to do this. Pt. was then educated on placing a sturdy piece of furniture beside her stairs to support her because she denied there being a wall next to her stairs. Pt.  reported that she uses this entry because her other entrance would require her to walk across grass in order to get there. Pt adamant that her spouse and daughter are always there to help her when needed. She was agreeable to rails, however does not feel comfortable asking her spouse's friends for help and states he is "too sick" right now for her to ask him to ask his friends.  Knee/Hip Exercises: Standing   Forward Step Up Both;1 set;10 reps;Hand Hold: 2;Step Height: 6"    Forward Step Up Limitations VC's for technique     Other Standing Knee Exercises Sit to stands/stand to sit; 10 reps from mat table; no UE use; independent               Balance Exercises - 12/01/19 1544      Balance Exercises: Standing   Balance Beam Pt. performed 10 reps ea, alternating forward and retro LE's on foam surface;  VC's needed for patient to increase step length and to only use UE's when necessary     Retro Gait Upper extremity support;Other reps (comment);Limitations   Pt. performed backward walking in the parallel bars   Retro Gait Limitations VC's needed for patient to increase step length and maintain upright posture    Sidestepping Foam/compliant support;Other reps (comment);Limitations;Upper extremity support   Pt. performed 2 laps of sidestepping in the parallell bars   Sidestepping Limitations VC's necessary for patient to "point toes straight" in order to avoid external rotation of the hip               PT Short Term Goals - 11/17/19 1655      PT SHORT TERM GOAL #1   Title = LTG             PT Long Term Goals - 12/01/19 1611      PT LONG TERM GOAL #1   Title Pt will be independent with final HEP for vestibular, balance and strengthening    Time 3    Period Weeks    Status On-going      PT LONG TERM GOAL #2   Title Will report 10 point further decrease in [place]    Baseline 84 severe > 62 (22 point decrease)    Time 3    Period Weeks    Status On-going      PT LONG  TERM GOAL #3   Title Pt will increase gait velocity without AD to 3.0 ft/sec    Baseline 2.14 ft/sec without AD    Time 3    Period Weeks    Status Not Met      PT LONG TERM GOAL #4   Title Pt will increase BERG to >/= 52/56 to indicate decreased risk for falls    Baseline 47/56    Time 3    Period Weeks    Status On-going      PT LONG TERM GOAL #5   Title Pt will report ability to participate in [place], laundry, cleaning - with supervision of husband or daughter and will report improvement in function on FOTO to >/= 62%    Baseline Is doing some light cleaning and laundry but hasn't attempted any cooking yet    Time 3    Period Weeks    Status On-going      PT LONG TERM GOAL #6   Status On-going                 Plan - 12/01/19 1535    Clinical Impression Statement Todays therapy session placed emphasis on gait training on stairs as well as balance training in the parallel bars. Because patient does not have rails at home stairs were also practiced with a cane which patient reports she does have at home. Pt. displayed fear and reluctance with descending stairs while using cane. She tolerated balance activities well and required only  2 rest breaks this session secondary to fatigue.    Personal Factors and Comorbidities Comorbidity 3+;Finances;Fitness;Transportation    Comorbidities cancer of R colon with colectomy and chemotherapy, constipation, CAD, CVA, cardiomegaly, HLD, HTN, anemia, NSTEMI, obesity, chronic back pain after back surgery, and a history of motion sickness    Examination-Activity Limitations Bend;Locomotion Level;Stairs;Stand    Examination-Participation Restrictions Cleaning;Laundry;Meal Prep    Stability/Clinical Decision Making Evolving/Moderate complexity    Rehab Potential Good    PT Frequency 1x / week    PT Duration 3 weeks    PT Treatment/Interventions ADLs/Self Care Home Management;Canalith Repostioning;Cryotherapy;Moist Heat;DME  Instruction;Gait training;Stair training;Functional mobility training;Therapeutic activities;Therapeutic exercise;Balance training;Neuromuscular re-education;Patient/family education;Orthotic Fit/Training;Vestibular    PT Next Visit Plan Assess goals for anticipated discharge.    PT Home Exercise Plan [email or web address]    Consulted and Agree with Plan of Care Patient           Patient will benefit from skilled therapeutic intervention in order to improve the following deficits and impairments:  Decreased activity tolerance, Decreased balance, Decreased endurance, Decreased strength, Difficulty walking, Dizziness, Impaired sensation, Postural dysfunction, Pain  Visit Diagnosis: Muscle weakness (generalized)  Difficulty in walking, not elsewhere classified  Unsteadiness on feet  Repeated falls     Problem List Patient Active Problem List   Diagnosis Date Noted  . Generalized weakness 07/05/2019  . Diarrhea 07/05/2019  . Fall at home, initial encounter 07/05/2019  . Port-A-Cath in place 02/23/2019  . Family history of colon cancer   . Personal history of colonic polyps   . History of MI (myocardial infarction) 01/22/2019  . Severe obesity ([place])   . PONV (postoperative nausea and vomiting)   . Iron deficiency anemia   . Hypertension   . Hyperlipidemia   . History of cardiomegaly   . GERD (gastroesophageal reflux disease)   . Cancer of right colon - resected 01/17/2019 01/17/2019  . CAD S/P percutaneous coronary angioplasty 07/10/2016  . Essential hypertension 02/28/2016  . Dyslipidemia, goal LDL below 70 02/28/2016    Royann Shivers, SPTA 12/01/2019, 4:31 PM  [facility] [address] [place], [place], [address] Phone: [phone]   Fax:  [phone]  Name: [name] MRN: [id number] Date of Birth: [birth year]  This note has been reviewed and edited by supervising CI.  Willow Ora, PTA, [facility] [address], [place], [place] [address] [phone] 12/01/19, 4:34 PM

## 2019-12-04 ENCOUNTER — Inpatient Hospital Stay: Payer: Medicare Other | Attending: Oncology

## 2019-12-04 ENCOUNTER — Ambulatory Visit (HOSPITAL_COMMUNITY)
Admission: RE | Admit: 2019-12-04 | Discharge: 2019-12-04 | Disposition: A | Discharge status: Home / Self Care | Payer: Medicare Other | Source: Ambulatory Visit | Attending: Oncology | Admitting: Oncology

## 2019-12-04 ENCOUNTER — Other Ambulatory Visit: Payer: Self-pay

## 2019-12-04 DIAGNOSIS — C182 Malignant neoplasm of ascending colon: Secondary | ICD-10-CM

## 2019-12-04 DIAGNOSIS — Z85038 Personal history of other malignant neoplasm of large intestine: Secondary | ICD-10-CM | POA: Insufficient documentation

## 2019-12-04 LAB — BASIC METABOLIC PANEL - CANCER CENTER ONLY
Anion gap: 9 (ref 5–15)
BUN: 15 mg/dL (ref 8–23)
CO2: 29 mmol/L (ref 22–32)
Calcium: 10.4 mg/dL — ABNORMAL HIGH (ref 8.9–10.3)
Chloride: 101 mmol/L (ref 98–111)
Creatinine: 1.09 mg/dL — ABNORMAL HIGH (ref 0.44–1.00)
GFR, Est AFR Am: 58 mL/min — ABNORMAL LOW (ref >60)
GFR, Estimated: 50 mL/min — ABNORMAL LOW (ref >60)
Glucose, Bld: 101 mg/dL — ABNORMAL HIGH (ref 70–99)
Potassium: 4.4 mmol/L (ref 3.5–5.1)
Sodium: 139 mmol/L (ref 135–145)

## 2019-12-04 LAB — CEA (IN HOUSE-CHCC): CEA (CHCC-In House): 2.03 ng/mL (ref 0.00–5.00)

## 2019-12-04 MED ORDER — SODIUM CHLORIDE (PF) 0.9 % IJ SOLN
INTRAMUSCULAR | Status: AC
  Filled 2019-12-04: qty 50

## 2019-12-04 MED ORDER — IOHEXOL 300 MG/ML  SOLN
100.0000 mL | Freq: Once | INTRAMUSCULAR | Status: AC | PRN
  Administered 2019-12-04: 100 mL via INTRAVENOUS

## 2019-12-06 ENCOUNTER — Ambulatory Visit: Payer: Medicare Other | Admitting: Nurse Practitioner

## 2019-12-07 ENCOUNTER — Inpatient Hospital Stay: Payer: Medicare Other | Admitting: Nurse Practitioner

## 2019-12-08 ENCOUNTER — Other Ambulatory Visit: Payer: Self-pay | Admitting: Oncology

## 2019-12-08 ENCOUNTER — Telehealth: Payer: Self-pay | Admitting: *Deleted

## 2019-12-08 ENCOUNTER — Encounter: Payer: Self-pay | Admitting: Physical Therapy

## 2019-12-08 ENCOUNTER — Inpatient Hospital Stay: Payer: Medicare Other | Attending: Oncology | Admitting: Oncology

## 2019-12-08 ENCOUNTER — Other Ambulatory Visit: Payer: Self-pay

## 2019-12-08 ENCOUNTER — Inpatient Hospital Stay: Payer: Medicare Other

## 2019-12-08 ENCOUNTER — Ambulatory Visit: Payer: Medicare Other | Attending: Family Medicine | Admitting: Physical Therapy

## 2019-12-08 VITALS — BP 141/65 | HR 86 | Temp 97.7°F | Resp 20 | Ht 62.0 in | Wt 166.5 lb

## 2019-12-08 DIAGNOSIS — R2681 Unsteadiness on feet: Secondary | ICD-10-CM | POA: Diagnosis present

## 2019-12-08 DIAGNOSIS — I252 Old myocardial infarction: Secondary | ICD-10-CM | POA: Insufficient documentation

## 2019-12-08 DIAGNOSIS — D649 Anemia, unspecified: Secondary | ICD-10-CM | POA: Diagnosis not present

## 2019-12-08 DIAGNOSIS — Z8673 Personal history of transient ischemic attack (TIA), and cerebral infarction without residual deficits: Secondary | ICD-10-CM | POA: Insufficient documentation

## 2019-12-08 DIAGNOSIS — C182 Malignant neoplasm of ascending colon: Secondary | ICD-10-CM | POA: Diagnosis not present

## 2019-12-08 DIAGNOSIS — Z9221 Personal history of antineoplastic chemotherapy: Secondary | ICD-10-CM | POA: Diagnosis not present

## 2019-12-08 DIAGNOSIS — M6281 Muscle weakness (generalized): Secondary | ICD-10-CM | POA: Insufficient documentation

## 2019-12-08 DIAGNOSIS — E538 Deficiency of other specified B group vitamins: Secondary | ICD-10-CM | POA: Insufficient documentation

## 2019-12-08 DIAGNOSIS — I10 Essential (primary) hypertension: Secondary | ICD-10-CM | POA: Insufficient documentation

## 2019-12-08 DIAGNOSIS — R262 Difficulty in walking, not elsewhere classified: Secondary | ICD-10-CM | POA: Diagnosis present

## 2019-12-08 DIAGNOSIS — Z85038 Personal history of other malignant neoplasm of large intestine: Secondary | ICD-10-CM | POA: Diagnosis present

## 2019-12-08 LAB — CBC WITH DIFFERENTIAL (CANCER CENTER ONLY)
Abs Immature Granulocytes: 0.03 10*3/uL (ref 0.00–0.07)
Basophils Absolute: 0.1 10*3/uL (ref 0.0–0.1)
Basophils Relative: 1 %
Eosinophils Absolute: 0.1 10*3/uL (ref 0.0–0.5)
Eosinophils Relative: 1 %
HCT: 39.4 % (ref 36.0–46.0)
Hemoglobin: 13 g/dL (ref 12.0–15.0)
Immature Granulocytes: 0 %
Lymphocytes Relative: 22 %
Lymphs Abs: 2 10*3/uL (ref 0.7–4.0)
MCH: 30.6 pg (ref 26.0–34.0)
MCHC: 33 g/dL (ref 30.0–36.0)
MCV: 92.7 fL (ref 80.0–100.0)
Monocytes Absolute: 0.5 10*3/uL (ref 0.1–1.0)
Monocytes Relative: 6 %
Neutro Abs: 6.4 10*3/uL (ref 1.7–7.7)
Neutrophils Relative %: 70 %
Platelet Count: 288 10*3/uL (ref 150–400)
RBC: 4.25 MIL/uL (ref 3.87–5.11)
RDW: 14.4 % (ref 11.5–15.5)
WBC Count: 9 10*3/uL (ref 4.0–10.5)
nRBC: 0 % (ref 0.0–0.2)

## 2019-12-08 LAB — VITAMIN B12: Vitamin B-12: 102 pg/mL — ABNORMAL LOW (ref 180–914)

## 2019-12-08 LAB — TSH: TSH: 3.402 u[IU]/mL (ref 0.308–3.960)

## 2019-12-08 MED ORDER — SODIUM CHLORIDE 0.9% FLUSH
10.0000 mL | INTRAVENOUS | Status: DC | PRN
  Administered 2019-12-08: 10 mL via INTRAVENOUS
  Filled 2019-12-08: qty 10

## 2019-12-08 MED ORDER — HEPARIN SOD (PORK) LOCK FLUSH 100 UNIT/ML IV SOLN
500.0000 [IU] | Freq: Once | INTRAVENOUS | Status: AC
  Administered 2019-12-08: 500 [IU] via INTRAVENOUS
  Filled 2019-12-08: qty 5

## 2019-12-08 NOTE — Patient Instructions (Signed)
Access Code: [email or web address] URL: [email or web address] Date: 12/08/2019 Prepared by: Willow Ora  Exercises Sit to Stand with Hands on Knees - 1 x daily - 7 x weekly - 10 reps - 1 sets Heel Toe Raises with Counter Support - 1 x daily - 7 x weekly - 8 reps - 1 sets Standing Single Leg Stance with Counter Support - 1 x daily - 7 x weekly - 2 sets - 5 seconds hold Seated Scapular Retraction - 1 x daily - 5 x weekly - 1 sets - 8 reps - 5 hold Seated Hip Adduction Isometrics with Ball - 1 x daily - 5 x weekly - 1 sets - 10 reps - 5 hold Seated Long Arc Quad - 1 x daily - 5 x weekly - 1 sets - 10 reps

## 2019-12-08 NOTE — Telephone Encounter (Addendum)
Faxed office note and labs from today to office of Dr. Anastasia Pall w/note that patient needs to be seen asap--multiple falls. Caryl Pina from PCP office called and confirmed fax received. Will get her seen next week.

## 2019-12-08 NOTE — Therapy (Addendum)
[facility] [address] [place], [place], [address] Phone: [phone]   Fax:  [phone]  Physical Therapy Treatment and D/C Summary  Patient Details  Name: [name] MRN: [id number] Date of Birth: 25-Nov-1945 Referring Provider (PT): Referred by [facility] physician; certification will be sent to PCP - Chesley Noon, MD   Encounter Date: 12/08/2019   PHYSICAL THERAPY DISCHARGE SUMMARY  Visits from Start of Care: 21  Current functional level related to goals / functional outcomes: Pt has made good progress and has met 1/5 LTG, partially meeting other 4.  Pt demonstrates improvements in cardiopulmonary endurance and activity tolerance, improved functional LE strength, improved balance, increased gait velocity, decreased falls risk and improved overall function.   Remaining deficits: Decreased standing endurance/activity tolerance, balance impairments - pt not using rollator in the home but is encouraged to continue to use in community and to continue to progressively increase her overall activity level at home when performing MRADL.   Education / Equipment: HEP  Plan: Patient agrees to discharge.  Patient goals were partially met. Patient is being discharged due to being pleased with the current functional level.  ?????     Rico Junker, PT, DPT 12/13/19    8:08 AM      PT End of Session - 12/08/19 1450    Visit Number 21    Number of Visits 21    Date for PT Re-Evaluation 12/08/19    Authorization Type [employer] $40 copay.  10th visit PN    Progress Note Due on Visit 20    PT Start Time 1448    PT Stop Time 1532    PT Time Calculation (min) 44 min    Equipment Utilized During Treatment Gait belt    Activity Tolerance Patient tolerated treatment well    Behavior During Therapy WFL for tasks assessed/performed           Past Medical History:  Diagnosis Date  . Cancer of right colon ([place])    . Constipation   . Coronary artery disease   . CVA (cerebral infarction) 1996  . Dyspnea    going up stairs and hills  . Family history of colon cancer   . GERD (gastroesophageal reflux disease)   . History of cardiomegaly   . Hyperlipidemia   . Hypertension   . Iron deficiency anemia   . Myocardial infarction ([place]) 2017  . NSTEMI (non-ST elevated myocardial infarction) ([place])   . Personal history of colonic polyps   . PONV (postoperative nausea and vomiting)   . Right shoulder pain    more painful at night , cold makes it worse  . Severe obesity ([place])     Past Surgical History:  Procedure Laterality Date  . ABDOMINAL HYSTERECTOMY    . BACK SURGERY     x2  . CARDIAC CATHETERIZATION N/A 02/28/2016   Procedure: Left Heart Cath and Coronary Angiography;  Surgeon: Peter M Martinique, MD;  Location: [facility];  Service: Cardiovascular;  Laterality: N/A;  . CARDIAC CATHETERIZATION N/A 02/28/2016   Procedure: Coronary Stent Intervention;  Surgeon: Peter M Martinique, MD;  Location: [facility];  Service: Cardiovascular;  Laterality: N/A;  . CHOLECYSTECTOMY    . COLONOSCOPY  11/24/2018  . CORONARY ANGIOPLASTY    . CYST EXCISION Left    Cheek  . LAPAROSCOPIC RIGHT HEMI COLECTOMY Right 01/17/2019   Procedure: LAPAROSCOPIC RIGHT HEMI COLECTOMY;  Surgeon: Alphonsa Overall, MD;  Location: [facility];  Service: General;  Laterality: Right;  . NASAL FRACTURE SURGERY    . PORTACATH PLACEMENT N/A 02/22/2019   Procedure: POWER PORT PLACEMENT;  Surgeon: Alphonsa Overall, MD;  Location: [facility];  Service: General;  Laterality: N/A;  . UPPER GI ENDOSCOPY  11/24/2018    There were no vitals filed for this visit.   Subjective Assessment - 12/08/19 1448    Subjective Patient reports that she is feeling tired today because she had a doctors appointment before therapy. No pain and no falls.    Pertinent History cancer of R colon with colectomy and chemotherapy, constipation, CAD, CVA, cardiomegaly, HLD,  HTN, anemia, NSTEMI, obesity, chronic back pain after back surgery, and a history of motion sickness    Limitations Standing;Walking;House hold activities    Patient Stated Goals To get back on her feet and walk without feeling like she is going to fall - started last June after finding out she had colon CA - 18 inches removed + chemo.    Currently in Pain? No/denies    Pain Score 0-No pain    Multiple Pain Sites No             [facility] PT Assessment - 12/08/19 1454      Standardized Balance Assessment   Standardized Balance Assessment Berg Balance Test;10 meter walk test    10 Meter Walk 13.75 seconds with no AD and supervision; 2.39 ft/sec       Berg Balance Test   Sit to Stand Able to stand without using hands and stabilize independently    Standing Unsupported Able to stand safely 2 minutes    Sitting with Back Unsupported but Feet Supported on Floor or Stool Able to sit safely and securely 2 minutes    Stand to Sit Sits safely with minimal use of hands    Transfers Able to transfer safely, minor use of hands    Standing Unsupported with Eyes Closed Able to stand 10 seconds safely    Standing Unsupported with Feet Together Able to place feet together independently and stand 1 minute safely    From Standing, Reach Forward with Outstretched Arm Can reach forward >12 cm safely (5")    From Standing Position, Pick up Object from Floor Able to pick up shoe safely and easily    From Standing Position, Turn to Look Behind Over each Shoulder Looks behind from both sides and weight shifts well    Turn 360 Degrees Able to turn 360 degrees safely one side only in 4 seconds or less    Standing Unsupported, Alternately Place Feet on Step/Stool Able to complete >2 steps/needs minimal assist    Standing Unsupported, One Foot in Front Able to place foot tandem independently and hold 30 seconds    Standing on One Leg Able to lift leg independently and hold > 10 seconds    Total Score 51              [facility] Adult PT Treatment/Exercise - 12/08/19 1526      Exercises   Exercises Other Exercises    Other Exercises  Reviewed, revised and revamped HEP; exercises were demo'd by SPTA and performed by pt. in todays session; VC's needed for technique. Refer to medbridge for current HEP.          Issued the following to HEP today: Access Code: [email or web address] URL: [email or web address] Date: 12/08/2019 Prepared by: Willow Ora  Exercises Sit to Stand with Hands on Knees - 1 x daily -  7 x weekly - 10 reps - 1 sets Heel Toe Raises with Counter Support - 1 x daily - 7 x weekly - 8 reps - 1 sets Standing Single Leg Stance with Counter Support - 1 x daily - 7 x weekly - 2 sets - 5 seconds hold Seated Scapular Retraction - 1 x daily - 5 x weekly - 1 sets - 8 reps - 5 hold Seated Hip Adduction Isometrics with Ball - 1 x daily - 5 x weekly - 1 sets - 10 reps - 5 hold Seated Long Arc Quad - 1 x daily - 5 x weekly - 1 sets - 10 reps   PT Education - 12/08/19 1605    Education Details progress toward goals; updated HEP;    Person(s) Educated Patient    Methods Explanation;Demonstration;Verbal cues;Handout    Comprehension Verbalized understanding;Returned demonstration            PT Short Term Goals - 11/17/19 1655      PT SHORT TERM GOAL #1   Title = LTG             PT Long Term Goals - 12/08/19 1529      PT LONG TERM GOAL #1   Title Pt will be independent with final HEP for vestibular, balance and strengthening    Baseline 12/08/19; HEP was revised and revamped today; pt. performed new exercises during the session and tolerated them well.    Time 3    Period Weeks    Status Achieved      PT LONG TERM GOAL #2   Title Will report 10 point further decrease in [place]    Baseline 12/08/19; 54 points scored today (decrease of 8 points).    Time 3    Period Weeks    Status Partially Met      PT LONG TERM GOAL #3   Title Pt will increase gait velocity without AD to 3.0  ft/sec    Baseline 12/08/19; pt. increased gait velocity without AD to 2.39 ft/sec today.    Time 3    Period Weeks    Status Partially Met      PT LONG TERM GOAL #4   Title Pt will increase BERG to >/= 52/56 to indicate decreased risk for falls    Baseline 12/08/19; 51/56    Time 3    Period Weeks    Status Partially Met      PT LONG TERM GOAL #5   Title Pt will report ability to participate in [place], laundry, cleaning - with supervision of husband or daughter and will report improvement in function on FOTO to >/= 62%    Baseline 12/08/19; Is doing some light cleaning and laundry and has been doing "light" cooking. FOTO score today is 48%.    Time 3    Period Weeks    Status Partially Met      PT LONG TERM GOAL #6   Status Partially Met             Plan - 12/08/19 1546    Clinical Impression Statement Todays therapy session focused on addressing all goals in preparation for discharge. The pt. has made progress toward all goals and achieved goal #1. She has made significant progress throughout therapy and reported noticing "a big difference" since she started.    Personal Factors and Comorbidities Comorbidity 3+;Finances;Fitness;Transportation    Comorbidities cancer of R colon with colectomy and chemotherapy, constipation, CAD, CVA, cardiomegaly, HLD,  HTN, anemia, NSTEMI, obesity, chronic back pain after back surgery, and a history of motion sickness    Examination-Activity Limitations Bend;Locomotion Level;Stairs;Stand    Examination-Participation Restrictions Cleaning;Laundry;Meal Prep    Stability/Clinical Decision Making Evolving/Moderate complexity    Rehab Potential Good    PT Frequency 1x / week    PT Duration 3 weeks    PT Treatment/Interventions ADLs/Self Care Home Management;Canalith Repostioning;Cryotherapy;Moist Heat;DME Instruction;Gait training;Stair training;Functional mobility training;Therapeutic activities;Therapeutic exercise;Balance  training;Neuromuscular re-education;Patient/family education;Orthotic Fit/Training;Vestibular    PT Next Visit Plan Discharge today.    PT Home Exercise Plan [email or web address]    Consulted and Agree with Plan of Care Patient           Patient will benefit from skilled therapeutic intervention in order to improve the following deficits and impairments:  Decreased activity tolerance, Decreased balance, Decreased endurance, Decreased strength, Difficulty walking, Dizziness, Impaired sensation, Postural dysfunction, Pain  Visit Diagnosis: Muscle weakness (generalized)  Difficulty in walking, not elsewhere classified  Unsteadiness on feet     Problem List Patient Active Problem List   Diagnosis Date Noted  . Vitamin B12 deficiency 12/08/2019  . Generalized weakness 07/05/2019  . Diarrhea 07/05/2019  . Fall at home, initial encounter 07/05/2019  . Port-A-Cath in place 02/23/2019  . Family history of colon cancer   . Personal history of colonic polyps   . History of MI (myocardial infarction) 01/22/2019  . Severe obesity ([place])   . PONV (postoperative nausea and vomiting)   . Iron deficiency anemia   . Hypertension   . Hyperlipidemia   . History of cardiomegaly   . GERD (gastroesophageal reflux disease)   . Cancer of right colon - resected 01/17/2019 01/17/2019  . CAD S/P percutaneous coronary angioplasty 07/10/2016  . Essential hypertension 02/28/2016  . Dyslipidemia, goal LDL below 70 02/28/2016    Royann Shivers, SPTA 12/08/2019, 4:09 PM  [facility] [address] [place], [place], [address] Phone: [phone]   Fax:  [phone]  Name: Hessie Varone MRN: [id number] Date of Birth: Dec 27, 1945  This note has been reviewed and edited by supervising CI.  Willow Ora, PTA, [facility] [address] [place], [place] [address] [phone] 12/08/19, 4:09 PM

## 2019-12-08 NOTE — Telephone Encounter (Signed)
Called pt & informed to come tomorrow at 9 am for Vit B-12 injection & to start oral Vit B12 1000 micrograms daily.  Message to schedulers to schedule weekly Vit b12 x 3 weeks.

## 2019-12-08 NOTE — Progress Notes (Signed)
[facility] OFFICE PROGRESS NOTE   Diagnosis: Colon cancer  INTERVAL HISTORY:   Ms. Wery reports persistent anorexia.  She has mild numbness in the hands and feet.  She has balance difficulty and reports several falls. She was admitted on 07/05/2019 with generalized weakness, diarrhea, and a fall.  C. difficile antigen returned positive.  She was evaluated by physical therapy while in the hospital and continued outpatient physical therapy.  She reports a recent fall at home.  She also felt "hot "when in a store this week.  Objective:  Vital signs in last 24 hours:  Blood pressure (!) 141/65, pulse 86, temperature 97.7 F (36.5 C), temperature source Temporal, resp. rate 20, height 5' 2"  (1.575 m), weight 166 lb 8 oz (75.5 kg), SpO2 100 %.    Lymphatics: No cervical, supraclavicular, axillary, or inguinal nodes Resp: Lungs clear bilaterally Cardio: Regular rate and rhythm GI: No hepatosplenomegaly, nontender, no mass Vascular: No leg edema Neuro: Alert and oriented, the motor exam appears intact in the upper and lower extremities bilaterally.  Slightly unsteady gait.    Portacath/PICC-without erythema  Lab Results:  Lab Results  Component Value Date   WBC 4.7 07/07/2019   HGB 11.1 (L) 07/07/2019   HCT 35.1 (L) 07/07/2019   MCV 98.9 07/07/2019   PLT 174 07/07/2019   NEUTROABS 2.8 07/07/2019    CMP  Lab Results  Component Value Date   NA 139 12/04/2019   K 4.4 12/04/2019   CL 101 12/04/2019   CO2 29 12/04/2019   GLUCOSE 101 (H) 12/04/2019   BUN 15 12/04/2019   CREATININE 1.09 (H) 12/04/2019   CALCIUM 10.4 (H) 12/04/2019   PROT 5.9 (L) 07/06/2019   ALBUMIN 2.9 (L) 07/06/2019   AST 19 07/06/2019   ALT 13 07/06/2019   ALKPHOS 66 07/06/2019   BILITOT 1.0 07/06/2019   GFRNONAA 50 (L) 12/04/2019   GFRAA 58 (L) 12/04/2019    Lab Results  Component Value Date   CEA1 2.03 12/04/2019     Imaging:  CT Chest W Contrast  Result Date:  12/04/2019 CLINICAL DATA:  Right-sided colon cancer. Prior appendectomy and hysterectomy. EXAM: CT CHEST, ABDOMEN, AND PELVIS WITH CONTRAST TECHNIQUE: Multidetector CT imaging of the chest, abdomen and pelvis was performed following the standard protocol during bolus administration of intravenous contrast. CONTRAST:  158m OMNIPAQUE IOHEXOL 300 MG/ML  SOLN COMPARISON:  Abdomen/pelvis CT 12/07/2018.  Chest CT 02/17/2019. FINDINGS: CT CHEST FINDINGS Cardiovascular: Heart size upper normal. No substantial pericardial effusion. Coronary artery calcification is evident. Atherosclerotic calcification is noted in the wall of the thoracic aorta. Left-sided Port-A-Cath tip is positioned in the distal SVC. Mediastinum/Nodes: No mediastinal lymphadenopathy. Stable 19 mm right thyroid nodule. There is no hilar lymphadenopathy. The esophagus has normal imaging features. There is no axillary lymphadenopathy. Lungs/Pleura: Tiny bilateral pulmonary nodules identified previously are stable. Index 2 mm right lower lobe nodule is unchanged on 94/4. Index 2 mm left lower lobe nodule on 48/4 is stable. Additional tiny nodules in the right upper (36/4) and left lower lobes (71/4) are unchanged. No new suspicious nodule or mass. No focal airspace consolidation. No pleural effusion. Musculoskeletal: No worrisome lytic or sclerotic osseous abnormality. CT ABDOMEN PELVIS FINDINGS Hepatobiliary: No suspicious focal abnormality within the liver parenchyma. Gallbladder is surgically absent. No intrahepatic or extrahepatic biliary dilation. Pancreas: No focal mass lesion. No dilatation of the main duct. No intraparenchymal cyst. No peripancreatic edema. Spleen: No splenomegaly. No focal mass lesion. Adrenals/Urinary Tract: No adrenal  nodule or mass. Stable 12 mm cyst lower pole right kidney. Left kidney unremarkable. No evidence for hydroureter. The urinary bladder appears normal for the degree of distention. Stomach/Bowel: Stomach is  unremarkable. No gastric wall thickening. No evidence of outlet obstruction. Duodenum is normally positioned as is the ligament of Treitz. No small bowel wall thickening. No small bowel dilatation. Status post right hemicolectomy. No gross colonic mass. No colonic wall thickening. Vascular/Lymphatic: There is abdominal aortic atherosclerosis without aneurysm. There is no gastrohepatic or hepatoduodenal ligament lymphadenopathy. No retroperitoneal or mesenteric lymphadenopathy. No pelvic sidewall lymphadenopathy. Reproductive: Uterus surgically absent.  There is no adnexal mass. Other: No intraperitoneal free fluid. Musculoskeletal: No worrisome lytic or sclerotic osseous abnormality. IMPRESSION: 1. No findings to suggest recurrent or metastatic disease in the chest, abdomen, or pelvis. 2. Tiny bilateral pulmonary nodules are stable in the interval since 02/17/2019. While unlikely to be metastatic, continued surveillance may be warranted. 3. Stable 19 mm right thyroid nodule. Thyroid ultrasound recommended. (Ref: J Am Coll Radiol. 2015 Feb;12(2): 143-50). 4. Aortic Atherosclerosis (ICD10-I70.0). Electronically Signed   By: Misty Stanley M.D.   On: 12/04/2019 13:51   CT Abdomen Pelvis W Contrast  Result Date: 12/04/2019 CLINICAL DATA:  Right-sided colon cancer. Prior appendectomy and hysterectomy. EXAM: CT CHEST, ABDOMEN, AND PELVIS WITH CONTRAST TECHNIQUE: Multidetector CT imaging of the chest, abdomen and pelvis was performed following the standard protocol during bolus administration of intravenous contrast. CONTRAST:  156m OMNIPAQUE IOHEXOL 300 MG/ML  SOLN COMPARISON:  Abdomen/pelvis CT 12/07/2018.  Chest CT 02/17/2019. FINDINGS: CT CHEST FINDINGS Cardiovascular: Heart size upper normal. No substantial pericardial effusion. Coronary artery calcification is evident. Atherosclerotic calcification is noted in the wall of the thoracic aorta. Left-sided Port-A-Cath tip is positioned in the distal SVC.  Mediastinum/Nodes: No mediastinal lymphadenopathy. Stable 19 mm right thyroid nodule. There is no hilar lymphadenopathy. The esophagus has normal imaging features. There is no axillary lymphadenopathy. Lungs/Pleura: Tiny bilateral pulmonary nodules identified previously are stable. Index 2 mm right lower lobe nodule is unchanged on 94/4. Index 2 mm left lower lobe nodule on 48/4 is stable. Additional tiny nodules in the right upper (36/4) and left lower lobes (71/4) are unchanged. No new suspicious nodule or mass. No focal airspace consolidation. No pleural effusion. Musculoskeletal: No worrisome lytic or sclerotic osseous abnormality. CT ABDOMEN PELVIS FINDINGS Hepatobiliary: No suspicious focal abnormality within the liver parenchyma. Gallbladder is surgically absent. No intrahepatic or extrahepatic biliary dilation. Pancreas: No focal mass lesion. No dilatation of the main duct. No intraparenchymal cyst. No peripancreatic edema. Spleen: No splenomegaly. No focal mass lesion. Adrenals/Urinary Tract: No adrenal nodule or mass. Stable 12 mm cyst lower pole right kidney. Left kidney unremarkable. No evidence for hydroureter. The urinary bladder appears normal for the degree of distention. Stomach/Bowel: Stomach is unremarkable. No gastric wall thickening. No evidence of outlet obstruction. Duodenum is normally positioned as is the ligament of Treitz. No small bowel wall thickening. No small bowel dilatation. Status post right hemicolectomy. No gross colonic mass. No colonic wall thickening. Vascular/Lymphatic: There is abdominal aortic atherosclerosis without aneurysm. There is no gastrohepatic or hepatoduodenal ligament lymphadenopathy. No retroperitoneal or mesenteric lymphadenopathy. No pelvic sidewall lymphadenopathy. Reproductive: Uterus surgically absent.  There is no adnexal mass. Other: No intraperitoneal free fluid. Musculoskeletal: No worrisome lytic or sclerotic osseous abnormality. IMPRESSION: 1. No  findings to suggest recurrent or metastatic disease in the chest, abdomen, or pelvis. 2. Tiny bilateral pulmonary nodules are stable in the interval since 02/17/2019. While unlikely to be  metastatic, continued surveillance may be warranted. 3. Stable 19 mm right thyroid nodule. Thyroid ultrasound recommended. (Ref: J Am Coll Radiol. 2015 Feb;12(2): 143-50). 4. Aortic Atherosclerosis (ICD10-I70.0). Electronically Signed   By: Misty Stanley M.D.   On: 12/04/2019 13:51    Medications: I have reviewed the patient's current medications.   Assessment/Plan: 1. Colon cancer(T3, N1b)  Colonoscopy 11/24/2018-large 5 cm malignant appearing fungating mass encompassing the ileocecal valve was identified. Pathology showed invasive adenocarcinoma, moderately differentiated.   CT abdomen/pelvis 12/07/2018-eccentric wall thickening in the cecum, around the ileocecal valve, compatible with findings reported at the recent colonoscopy. Borderline enlarged adjacent lymph nodes in the ileocolic mesentery. No evidence for liver metastases.   CEA 01/13/2019-12.7  CEA 02/23/19 postop/at start of adjuvant chemo - 1.83  01/17/2019 right hemicolectomy by Dr. Lucia Gaskins. 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  Declined genetic testing on 02/20/19, patient indicated she may reconsider after completing adjuvant treatment. Seen by Roma Kayser  CT chest 02/17/2019-tiny bilateral pulmonary nodules felt to be unlikely related to metastatic disease  PAC placement per Dr. Lucia Gaskins 02/22/2019   Cycle 1 adjuvant FOLFOX 02/23/19  Cycle 2 adjuvant FOLFOX 03/09/2019  Cycle 3 adjuvant FOLFOX 03/23/2019 (oxaliplatin dose reduced due to low normal  neutrophil count)  Cycle 4 adjuvant FOLFOX 04/06/2019  Cycle 5 adjuvant FOLFOX 05/01/2019  Cycle 6 adjuvant FOLFOX 05/15/2019 (oxaliplatin held due to neuropathy)  CT 12/04/2019-stable right thyroid nodule, no evidence of metastatic disease, stable bilateral lung nodules  2. History of iron deficiency anemia 3. Hypertension 4. History of MI status post stent placement 5. 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 Anorexia/weight loss 8. Report of balance difficulty     Disposition: [name] is in clinical remission from colon cancer.  The restaging CTs revealed no evidence of recurrent disease.  The CEA is normal.  The etiology of her symptom complex is unclear.  She has lost a significant amount of weight since I saw her in January.  We will check a vitamin B12 and TSH today.  I recommended she follow-up with Dr. Melford Aase as soon as possible.  Ms. Abernathy would like to keep the Port-A-Cath in place.  She will return for an office visit and Port-A-Cath flush in approximately 6 weeks.  Betsy Coder, MD  12/08/2019  11:16 AM

## 2019-12-09 ENCOUNTER — Inpatient Hospital Stay: Payer: Medicare Other

## 2019-12-09 ENCOUNTER — Other Ambulatory Visit: Payer: Self-pay

## 2019-12-09 VITALS — BP 133/76 | HR 78 | Temp 98.1°F | Resp 16

## 2019-12-09 DIAGNOSIS — E538 Deficiency of other specified B group vitamins: Secondary | ICD-10-CM

## 2019-12-09 DIAGNOSIS — Z95828 Presence of other vascular implants and grafts: Secondary | ICD-10-CM

## 2019-12-09 DIAGNOSIS — C182 Malignant neoplasm of ascending colon: Secondary | ICD-10-CM

## 2019-12-09 DIAGNOSIS — Z85038 Personal history of other malignant neoplasm of large intestine: Secondary | ICD-10-CM | POA: Diagnosis not present

## 2019-12-09 MED ORDER — CYANOCOBALAMIN 1000 MCG/ML IJ SOLN
1000.0000 ug | Freq: Once | INTRAMUSCULAR | Status: AC
  Administered 2019-12-09: 1000 ug via SUBCUTANEOUS

## 2019-12-09 NOTE — Patient Instructions (Signed)

## 2019-12-16 ENCOUNTER — Other Ambulatory Visit: Payer: Self-pay

## 2019-12-16 ENCOUNTER — Inpatient Hospital Stay: Payer: Medicare Other

## 2019-12-16 VITALS — BP 145/81 | HR 77 | Temp 97.5°F | Resp 18

## 2019-12-16 DIAGNOSIS — E538 Deficiency of other specified B group vitamins: Secondary | ICD-10-CM

## 2019-12-16 DIAGNOSIS — Z85038 Personal history of other malignant neoplasm of large intestine: Secondary | ICD-10-CM | POA: Diagnosis not present

## 2019-12-16 DIAGNOSIS — Z95828 Presence of other vascular implants and grafts: Secondary | ICD-10-CM

## 2019-12-16 DIAGNOSIS — C182 Malignant neoplasm of ascending colon: Secondary | ICD-10-CM

## 2019-12-16 MED ORDER — CYANOCOBALAMIN 1000 MCG/ML IJ SOLN
1000.0000 ug | Freq: Once | INTRAMUSCULAR | Status: AC
  Administered 2019-12-16: 1000 ug via SUBCUTANEOUS

## 2019-12-16 NOTE — Patient Instructions (Signed)
Cyanocobalamin, Pyridoxine, and Folate What is this medicine? A multivitamin containing folic acid, vitamin B6, and vitamin B12. This medicine may be used for other purposes; ask your health care provider or pharmacist if you have questions. COMMON BRAND NAME(S): AllanFol RX, AllanTex, Av-Vite FB, B Complex with Folic Acid, ComBgen, FaBB, Folamin, Folastin, Folbalin, Folbee, Folbic, Folcaps, Folgard, Folgard RX, Folgard RX 2.2, Folplex, Folplex 2.2, Foltabs 800, Foltx, Homocysteine Formula, Niva-Fol, NuFol, TL Gard RX, Virt-Gard, Virt-Vite, Virt-Vite Forte, Vita-Respa What should I tell my health care provider before I take this medicine? They need to know if you have any of these conditions:  bleeding or clotting disorder  history of anemia of any type  other chronic health condition  an unusual or allergic reaction to vitamins, other medicines, foods, dyes, or preservatives  pregnant or trying to get pregnant  breast-feeding How should I use this medicine? Take by mouth with a glass of water. May take with food. Follow the directions on the prescription label. It is usually given once a day. Do not take your medicine more often than directed. Contact your pediatrician regarding the use of this medicine in children. Special care may be needed. Overdosage: If you think you have taken too much of this medicine contact a poison control center or emergency room at once. NOTE: This medicine is only for you. Do not share this medicine with others. What if I miss a dose? If you miss a dose, take it as soon as you can. If it is almost time for your next dose, take only that dose. Do not take double or extra doses. What may interact with this medicine?  levodopa This list may not describe all possible interactions. Give your health care provider a list of all the medicines, herbs, non-prescription drugs, or dietary supplements you use. Also tell them if you smoke, drink alcohol, or use illegal  drugs. Some items may interact with your medicine. What should I watch for while using this medicine? See your health care professional for regular checks on your progress. Remember that vitamin supplements do not replace the need for good nutrition from a balanced diet. What side effects may I notice from receiving this medicine? Side effects that you should report to your doctor or health care professional as soon as possible:  allergic reaction such as skin rash or difficulty breathing  vomiting Side effects that usually do not require medical attention (report to your doctor or health care professional if they continue or are bothersome):  nausea  stomach upset This list may not describe all possible side effects. Call your doctor for medical advice about side effects. You may report side effects to FDA at 1-800-FDA-1088. Where should I keep my medicine? Keep out of the reach of children. Most vitamins should be stored at controlled room temperature. Check your specific product directions. Protect from heat and moisture. Throw away any unused medicine after the expiration date. NOTE: This sheet is a summary. It may not cover all possible information. If you have questions about this medicine, talk to your doctor, pharmacist, or health care provider.  2020 Elsevier/Gold Standard (2007-07-16 00:59:55)  

## 2019-12-19 ENCOUNTER — Telehealth: Payer: Self-pay | Admitting: *Deleted

## 2019-12-19 NOTE — Telephone Encounter (Signed)
Called to request to move her 01/22/20 port flush to another day due to having an appointment w/PCP that day. Scheduling message sent.

## 2019-12-22 ENCOUNTER — Telehealth: Payer: Self-pay | Admitting: Emergency Medicine

## 2019-12-22 NOTE — Telephone Encounter (Signed)
Ok to give B-12 injection tomorrow per MD Benay Spice, pt getting injection once a week for four weeks (instead of q 4 weeks).

## 2019-12-23 ENCOUNTER — Inpatient Hospital Stay: Payer: Medicare Other

## 2019-12-23 ENCOUNTER — Other Ambulatory Visit: Payer: Self-pay

## 2019-12-23 VITALS — BP 145/72 | HR 77 | Temp 97.9°F | Resp 18

## 2019-12-23 DIAGNOSIS — C182 Malignant neoplasm of ascending colon: Secondary | ICD-10-CM

## 2019-12-23 DIAGNOSIS — Z85038 Personal history of other malignant neoplasm of large intestine: Secondary | ICD-10-CM | POA: Diagnosis not present

## 2019-12-23 DIAGNOSIS — E538 Deficiency of other specified B group vitamins: Secondary | ICD-10-CM

## 2019-12-23 DIAGNOSIS — Z95828 Presence of other vascular implants and grafts: Secondary | ICD-10-CM

## 2019-12-23 MED ORDER — CYANOCOBALAMIN 1000 MCG/ML IJ SOLN
1000.0000 ug | Freq: Once | INTRAMUSCULAR | Status: AC
  Administered 2019-12-23: 1000 ug via SUBCUTANEOUS

## 2019-12-23 NOTE — Patient Instructions (Signed)
Cyanocobalamin, Pyridoxine, and Folate What is this medicine? A multivitamin containing folic acid, vitamin B6, and vitamin B12. This medicine may be used for other purposes; ask your health care provider or pharmacist if you have questions. COMMON BRAND NAME(S): AllanFol RX, AllanTex, Av-Vite FB, B Complex with Folic Acid, ComBgen, FaBB, Folamin, Folastin, Folbalin, Folbee, Folbic, Folcaps, Folgard, Folgard RX, Folgard RX 2.2, Folplex, Folplex 2.2, Foltabs 800, Foltx, Homocysteine Formula, Niva-Fol, NuFol, TL Gard RX, Virt-Gard, Virt-Vite, Virt-Vite Forte, Vita-Respa What should I tell my health care provider before I take this medicine? They need to know if you have any of these conditions:  bleeding or clotting disorder  history of anemia of any type  other chronic health condition  an unusual or allergic reaction to vitamins, other medicines, foods, dyes, or preservatives  pregnant or trying to get pregnant  breast-feeding How should I use this medicine? Take by mouth with a glass of water. May take with food. Follow the directions on the prescription label. It is usually given once a day. Do not take your medicine more often than directed. Contact your pediatrician regarding the use of this medicine in children. Special care may be needed. Overdosage: If you think you have taken too much of this medicine contact a poison control center or emergency room at once. NOTE: This medicine is only for you. Do not share this medicine with others. What if I miss a dose? If you miss a dose, take it as soon as you can. If it is almost time for your next dose, take only that dose. Do not take double or extra doses. What may interact with this medicine?  levodopa This list may not describe all possible interactions. Give your health care provider a list of all the medicines, herbs, non-prescription drugs, or dietary supplements you use. Also tell them if you smoke, drink alcohol, or use illegal  drugs. Some items may interact with your medicine. What should I watch for while using this medicine? See your health care professional for regular checks on your progress. Remember that vitamin supplements do not replace the need for good nutrition from a balanced diet. What side effects may I notice from receiving this medicine? Side effects that you should report to your doctor or health care professional as soon as possible:  allergic reaction such as skin rash or difficulty breathing  vomiting Side effects that usually do not require medical attention (report to your doctor or health care professional if they continue or are bothersome):  nausea  stomach upset This list may not describe all possible side effects. Call your doctor for medical advice about side effects. You may report side effects to FDA at 1-800-FDA-1088. Where should I keep my medicine? Keep out of the reach of children. Most vitamins should be stored at controlled room temperature. Check your specific product directions. Protect from heat and moisture. Throw away any unused medicine after the expiration date. NOTE: This sheet is a summary. It may not cover all possible information. If you have questions about this medicine, talk to your doctor, pharmacist, or health care provider.  2020 Elsevier/Gold Standard (2007-07-16 00:59:55)  

## 2019-12-30 ENCOUNTER — Inpatient Hospital Stay: Payer: Medicare Other

## 2019-12-30 ENCOUNTER — Other Ambulatory Visit: Payer: Self-pay

## 2019-12-30 VITALS — BP 147/79 | HR 77 | Temp 98.9°F | Resp 17 | Ht 62.0 in

## 2019-12-30 DIAGNOSIS — C182 Malignant neoplasm of ascending colon: Secondary | ICD-10-CM

## 2019-12-30 DIAGNOSIS — E538 Deficiency of other specified B group vitamins: Secondary | ICD-10-CM

## 2019-12-30 DIAGNOSIS — Z85038 Personal history of other malignant neoplasm of large intestine: Secondary | ICD-10-CM | POA: Diagnosis not present

## 2019-12-30 DIAGNOSIS — Z95828 Presence of other vascular implants and grafts: Secondary | ICD-10-CM

## 2019-12-30 MED ORDER — CYANOCOBALAMIN 1000 MCG/ML IJ SOLN
1000.0000 ug | Freq: Once | INTRAMUSCULAR | Status: AC
  Administered 2019-12-30: 1000 ug via SUBCUTANEOUS

## 2019-12-30 NOTE — Patient Instructions (Signed)
Cyanocobalamin, Pyridoxine, and Folate What is this medicine? A multivitamin containing folic acid, vitamin B6, and vitamin B12. This medicine may be used for other purposes; ask your health care provider or pharmacist if you have questions. COMMON BRAND NAME(S): AllanFol RX, AllanTex, Av-Vite FB, B Complex with Folic Acid, ComBgen, FaBB, Folamin, Folastin, Folbalin, Folbee, Folbic, Folcaps, Folgard, Folgard RX, Folgard RX 2.2, Folplex, Folplex 2.2, Foltabs 800, Foltx, Homocysteine Formula, Niva-Fol, NuFol, TL Gard RX, Virt-Gard, Virt-Vite, Virt-Vite Forte, Vita-Respa What should I tell my health care provider before I take this medicine? They need to know if you have any of these conditions:  bleeding or clotting disorder  history of anemia of any type  other chronic health condition  an unusual or allergic reaction to vitamins, other medicines, foods, dyes, or preservatives  pregnant or trying to get pregnant  breast-feeding How should I use this medicine? Take by mouth with a glass of water. May take with food. Follow the directions on the prescription label. It is usually given once a day. Do not take your medicine more often than directed. Contact your pediatrician regarding the use of this medicine in children. Special care may be needed. Overdosage: If you think you have taken too much of this medicine contact a poison control center or emergency room at once. NOTE: This medicine is only for you. Do not share this medicine with others. What if I miss a dose? If you miss a dose, take it as soon as you can. If it is almost time for your next dose, take only that dose. Do not take double or extra doses. What may interact with this medicine?  levodopa This list may not describe all possible interactions. Give your health care provider a list of all the medicines, herbs, non-prescription drugs, or dietary supplements you use. Also tell them if you smoke, drink alcohol, or use illegal  drugs. Some items may interact with your medicine. What should I watch for while using this medicine? See your health care professional for regular checks on your progress. Remember that vitamin supplements do not replace the need for good nutrition from a balanced diet. What side effects may I notice from receiving this medicine? Side effects that you should report to your doctor or health care professional as soon as possible:  allergic reaction such as skin rash or difficulty breathing  vomiting Side effects that usually do not require medical attention (report to your doctor or health care professional if they continue or are bothersome):  nausea  stomach upset This list may not describe all possible side effects. Call your doctor for medical advice about side effects. You may report side effects to FDA at 1-800-FDA-1088. Where should I keep my medicine? Keep out of the reach of children. Most vitamins should be stored at controlled room temperature. Check your specific product directions. Protect from heat and moisture. Throw away any unused medicine after the expiration date. NOTE: This sheet is a summary. It may not cover all possible information. If you have questions about this medicine, talk to your doctor, pharmacist, or health care provider.  2020 Elsevier/Gold Standard (2007-07-16 00:59:55)  

## 2020-01-22 ENCOUNTER — Ambulatory Visit: Payer: Medicare Other | Admitting: Nurse Practitioner

## 2020-01-23 ENCOUNTER — Other Ambulatory Visit: Payer: Self-pay

## 2020-01-23 ENCOUNTER — Telehealth: Payer: Self-pay | Admitting: Oncology

## 2020-01-23 ENCOUNTER — Inpatient Hospital Stay: Payer: Medicare Other | Admitting: Nurse Practitioner

## 2020-01-23 ENCOUNTER — Inpatient Hospital Stay: Payer: Medicare Other | Attending: Oncology

## 2020-01-23 ENCOUNTER — Encounter: Payer: Self-pay | Admitting: Nurse Practitioner

## 2020-01-23 VITALS — BP 169/84 | HR 75 | Temp 98.0°F | Resp 16 | Ht 62.0 in | Wt 169.8 lb

## 2020-01-23 DIAGNOSIS — Z452 Encounter for adjustment and management of vascular access device: Secondary | ICD-10-CM | POA: Insufficient documentation

## 2020-01-23 DIAGNOSIS — I252 Old myocardial infarction: Secondary | ICD-10-CM | POA: Insufficient documentation

## 2020-01-23 DIAGNOSIS — I10 Essential (primary) hypertension: Secondary | ICD-10-CM | POA: Insufficient documentation

## 2020-01-23 DIAGNOSIS — E538 Deficiency of other specified B group vitamins: Secondary | ICD-10-CM | POA: Diagnosis not present

## 2020-01-23 DIAGNOSIS — Z85038 Personal history of other malignant neoplasm of large intestine: Secondary | ICD-10-CM | POA: Insufficient documentation

## 2020-01-23 DIAGNOSIS — E041 Nontoxic single thyroid nodule: Secondary | ICD-10-CM | POA: Insufficient documentation

## 2020-01-23 DIAGNOSIS — G620 Drug-induced polyneuropathy: Secondary | ICD-10-CM | POA: Insufficient documentation

## 2020-01-23 DIAGNOSIS — C182 Malignant neoplasm of ascending colon: Secondary | ICD-10-CM | POA: Diagnosis not present

## 2020-01-23 DIAGNOSIS — Z8673 Personal history of transient ischemic attack (TIA), and cerebral infarction without residual deficits: Secondary | ICD-10-CM | POA: Insufficient documentation

## 2020-01-23 DIAGNOSIS — Z95828 Presence of other vascular implants and grafts: Secondary | ICD-10-CM

## 2020-01-23 MED ORDER — SODIUM CHLORIDE 0.9% FLUSH
10.0000 mL | INTRAVENOUS | Status: DC | PRN
  Administered 2020-01-23: 10 mL via INTRAVENOUS
  Filled 2020-01-23: qty 10

## 2020-01-23 MED ORDER — HEPARIN SOD (PORK) LOCK FLUSH 100 UNIT/ML IV SOLN
500.0000 [IU] | Freq: Once | INTRAVENOUS | Status: AC
  Administered 2020-01-23: 500 [IU] via INTRAVENOUS
  Filled 2020-01-23: qty 5

## 2020-01-23 NOTE — Telephone Encounter (Signed)
Scheduled appointment sper 8/17 los. Patient is aware of appointments dates and times.  

## 2020-01-23 NOTE — Patient Instructions (Signed)

## 2020-01-23 NOTE — Progress Notes (Signed)
[facility] OFFICE PROGRESS NOTE   Diagnosis: Colon cancer  INTERVAL HISTORY:   Ms. Nonaka returns as scheduled.  Bowels overall moving regularly.  No blood with bowel movements.  No abdominal pain.  She reports no recent falls.  She completed outpatient rehab with some improvement.  She ambulates with a walker.  She has occasional numbness in the right hand with certain positions.  She has an intermittent "aching" sensation in the feet.  Objective:  Vital signs in last 24 hours:  Blood pressure (!) 169/84, pulse 75, temperature 98 F (36.7 C), temperature source Tympanic, resp. rate 16, height _0  (1.575 m), weight 169 lb 12.8 oz (77 kg), SpO2 100 %.    HEENT: Neck without mass. Lymphatics: No palpable cervical, supraclavicular, axillary or inguinal lymph nodes. Resp: Lungs clear bilaterally. Cardio: Regular rate and rhythm. GI: Abdomen soft and nontender.  No hepatomegaly. Vascular: No leg edema. Neuro: Alert and oriented. Port-A-Cath without erythema.  Lab Results:  Lab Results  Component Value Date   WBC 9.0 12/08/2019   HGB 13.0 12/08/2019   HCT 39.4 12/08/2019   MCV 92.7 12/08/2019   PLT 288 12/08/2019   NEUTROABS 6.4 12/08/2019    Imaging:  No results found.  Medications: I have reviewed the patient's current medications.  Assessment/Plan: 1. Colon cancer(T3, N1b)  Colonoscopy 11/24/2018-large 5 cm malignant appearing fungating mass encompassing the ileocecal valve was identified. Pathology showed invasive adenocarcinoma, moderately differentiated.   CT abdomen/pelvis 12/07/2018-eccentric wall thickening in the cecum, around the ileocecal valve, compatible with findings reported at the recent colonoscopy. Borderline enlarged adjacent lymph nodes in the ileocolic mesentery. No evidence for liver metastases.   CEA 01/13/2019-12.7  CEA 02/23/19 postop/at start of adjuvant chemo - 1.83  01/17/2019 right hemicolectomy by Dr. Lucia Gaskins. Final  pathology showed invasive colonic adenocarcinoma, 3.3 cm; tumor invades through the muscularis propria into pericolonic tissue; margins not involved; metastatic carcinoma present in 2 of 18 lymph nodes; lymphovascular invasion present; perineural invasion not identified; mismatch repair protein (IHC) normal; MSI stable.  Declined genetic testing on 02/20/19, patient indicated she may reconsider after completing adjuvant treatment. Seen by Roma Kayser  CT chest 02/17/2019-tiny bilateral pulmonary nodules felt to be unlikely related to metastatic disease  PAC placement per Dr. Lucia Gaskins 02/22/2019   Cycle 1 adjuvant FOLFOX 02/23/19  Cycle 2 adjuvant FOLFOX 03/09/2019  Cycle 3 adjuvant FOLFOX 03/23/2019 (oxaliplatin dose reduced due to low normal neutrophil count)  Cycle 4 adjuvant FOLFOX 04/06/2019  Cycle 5 adjuvant FOLFOX 05/01/2019  Cycle 6 adjuvant FOLFOX 05/15/2019 (oxaliplatin held due to neuropathy)  CT 12/04/2019-stable right thyroid nodule, no evidence of metastatic disease, stable bilateral lung nodules  2. History of iron deficiency anemia 3. Hypertension 4. History of MI status post stent placement 5. 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 Anorexia/weight loss 8. Report of balance difficulty 9. B 12 deficiency-B12 weekly x4 beginning 12/09/2019.  On oral B12.    Disposition: Ms. Westra remains in clinical remission from colon cancer.  She is due for the 1 year colonoscopy.  She will contact gastroenterology to schedule.  She completed 4 weekly subcutaneous B12 injections and is now taking oral B12.  We will check a B12 level when she returns in 3 months.  She will continue follow-up with Dr. Melford Aase regarding the balance difficulty.  She will return for a Port-A-Cath flush in 6 weeks.  We will see her in follow-up with a CEA and B12 level in 3 months.  Plan  reviewed with Dr. Benay Spice.    Ned Card ANP/GNP-BC   01/23/2020  12:13 PM

## 2020-02-02 NOTE — Progress Notes (Signed)
Cardiology Office Note    Date:  02/05/2020   ID:  Cassie, Thomas 1946-02-21, MRN [id number]  PCP:  Dr. Anastasia Pall at [facility]  Cardiologist:  Dr. Martinique  Chief Complaint  Patient presents with  . Coronary Artery Disease    History of Present Illness:  [name] is a 74 y.o. female with PMH of HTN, elevated lipids who was admitted on 02/28/2016 with substernal chest pain, abnormal EKG and positive troponin.  Her troponin was 0.55. She opted for definitive diagnosis with cardiac catheterization. She underwent a cardiac catheterization on 02/26/2016 which showed a 20% proximal to mid RCA lesion, 25% proximal LAD lesion, 30% mid LAD lesion, 95% distal RCA lesion treated with Synergy 3 x 12 mm DES, normal LVEDP. Post procedure, patient was placed on aspirin, Brilinta, she was resumed on her losartan. Toprol-XL was added to her medical regimen. Echocardiogram obtained on 02/29/2016 showed EF 40-08%, grade 1 diastolic dysfunction, mild AR, mildly reduced RV EF. She is intolerant of statins. Unable to afford PCSK 9 inhibitor.   She underwent right hemicolectomy in August for colon CA 2020. She did receive chemotherapy but completed this in January. CT in June 2021 showed no evidence of cancer.   She is doing well from a cardiac standpoint. Rare twinge of chest pain. No dyspnea. Walks with a walker.      Past Medical History:  Diagnosis Date  . Cancer of right colon ([facility])   . Constipation   . Coronary artery disease   . CVA (cerebral infarction) 1996  . Dyspnea    going up stairs and hills  . Family history of colon cancer   . GERD (gastroesophageal reflux disease)   . History of cardiomegaly   . Hyperlipidemia   . Hypertension   . Iron deficiency anemia   . Myocardial infarction ([place]) 2017  . NSTEMI (non-ST elevated myocardial infarction) ([place])   . Personal history of colonic polyps   . PONV (postoperative nausea and vomiting)   . Right shoulder  pain    more painful at night , cold makes it worse  . Severe obesity ([facility])     Past Surgical History:  Procedure Laterality Date  . ABDOMINAL HYSTERECTOMY    . BACK SURGERY     x2  . CARDIAC CATHETERIZATION N/A 02/28/2016   Procedure: Left Heart Cath and Coronary Angiography;  Surgeon: Nigil Braman M Martinique, MD;  Location: [facility];  Service: Cardiovascular;  Laterality: N/A;  . CARDIAC CATHETERIZATION N/A 02/28/2016   Procedure: Coronary Stent Intervention;  Surgeon: Kieren Ricci M Martinique, MD;  Location: [facility];  Service: Cardiovascular;  Laterality: N/A;  . CHOLECYSTECTOMY    . COLONOSCOPY  11/24/2018  . CORONARY ANGIOPLASTY    . CYST EXCISION Left    Cheek  . LAPAROSCOPIC RIGHT HEMI COLECTOMY Right 01/17/2019   Procedure: LAPAROSCOPIC RIGHT HEMI COLECTOMY;  Surgeon: Alphonsa Overall, MD;  Location: [facility];  Service: General;  Laterality: Right;  . NASAL FRACTURE SURGERY    . PORTACATH PLACEMENT N/A 02/22/2019   Procedure: POWER PORT PLACEMENT;  Surgeon: Alphonsa Overall, MD;  Location: [facility];  Service: General;  Laterality: N/A;  . UPPER GI ENDOSCOPY  11/24/2018    Current Medications: Outpatient Medications Prior to Visit  Medication Sig Dispense Refill  . amLODipine (NORVASC) 5 MG tablet Take 5 mg by mouth daily.    . ASPIRIN 81 PO Take by mouth.    . ezetimibe (ZETIA) 10 MG tablet  Take 1 tablet by mouth once daily 90 tablet 1  . Ferrous Sulfate (IRON PO) Take 65 mg by mouth daily.    Marland Kitchen loperamide (IMODIUM) 2 MG capsule Take 2-4 mg by mouth 4 (four) times daily as needed for diarrhea or loose stools.     Marland Kitchen losartan (COZAAR) 100 MG tablet Take 100 mg by mouth daily.    Marland Kitchen omeprazole (PRILOSEC) 40 MG capsule Take 40 mg by mouth daily.    . vitamin B-12 (CYANOCOBALAMIN) 1000 MCG tablet Take 1,000 mcg by mouth daily.    Marland Kitchen VITAMIN D PO Take by mouth.     No facility-administered medications prior to visit.     Allergies:   Guaifenesin er, Lotensin [benazepril hcl], and  Propoxyphene   Social History   Socioeconomic History  . Marital status: Married    Spouse name: Not on file  . Number of children: Not on file  . Years of education: Not on file  . Highest education level: Not on file  Occupational History  . Not on file  Tobacco Use  . Smoking status: Never Smoker  . Smokeless tobacco: Never Used  Vaping Use  . Vaping Use: Never used  Substance and Sexual Activity  . Alcohol use: No  . Drug use: No  . Sexual activity: Not on file  Other Topics Concern  . Not on file  Social History Narrative  . Not on file   Social Determinants of Health   Financial Resource Strain:   . Difficulty of Paying Living Expenses: Not on file  Food Insecurity:   . Worried About Charity fundraiser in the Last Year: Not on file  . Ran Out of Food in the Last Year: Not on file  Transportation Needs:   . Lack of Transportation (Medical): Not on file  . Lack of Transportation (Non-Medical): Not on file  Physical Activity:   . Days of Exercise per Week: Not on file  . Minutes of Exercise per Session: Not on file  Stress:   . Feeling of Stress : Not on file  Social Connections:   . Frequency of Communication with Friends and Family: Not on file  . Frequency of Social Gatherings with Friends and Family: Not on file  . Attends Religious Services: Not on file  . Active Member of Clubs or Organizations: Not on file  . Attends Archivist Meetings: Not on file  . Marital Status: Not on file     Family History:  The patient's family history includes Breast cancer in her cousin and maternal aunt; CAD in her daughter; Colon cancer (age of onset: 55) in her sister; Colon polyps in her sister; Esophageal cancer (age of onset: 70) in her sister; Lung cancer (age of onset: 26) in her brother.   ROS:   Please see the history of present illness.    ROS All other systems reviewed and are negative.   PHYSICAL EXAM:   VS:  BP 122/82   Pulse (!) 53   Ht 5\' 2"   (1.575 m)   Wt 173 lb 9.6 oz (78.7 kg)   SpO2 99%   BMI 31.75 kg/m    GENERAL:  Well appearing, obese WF in NAD HEENT:  PERRL, EOMI, sclera are clear. Oropharynx is clear. NECK:  No jugular venous distention, carotid upstroke brisk and symmetric, no bruits, no thyromegaly or adenopathy LUNGS:  Clear to auscultation bilaterally CHEST:  Unremarkable HEART:  RRR,  PMI not displaced or sustained,S1 and  S2 within normal limits, no S3, no S4: no clicks, no rubs, no murmurs ABD:  Soft, nontender. BS +, no masses or bruits. No hepatomegaly, no splenomegaly EXT:  2 + pulses throughout, no edema, no cyanosis no clubbing SKIN:  Warm and dry.  No rashes NEURO:  Alert and oriented x 3. Cranial nerves II through XII intact. PSYCH:  Cognitively intact    Wt Readings from Last 3 Encounters:  02/05/20 173 lb 9.6 oz (78.7 kg)  01/23/20 169 lb 12.8 oz (77 kg)  12/08/19 166 lb 8 oz (75.5 kg)      Studies/Labs Reviewed:   EKG:  EKG is not ordered today.    Recent Labs: 07/06/2019: ALT 13 12/04/2019: BUN 15; Creatinine 1.09; Potassium 4.4; Sodium 139 12/08/2019: Hemoglobin 13.0; Platelet Count 288; TSH 3.402   Lipid Panel    Component Value Date/Time   CHOL 159 02/29/2016 0556   TRIG 65 02/29/2016 0556   HDL 37 (L) 02/29/2016 0556   CHOLHDL 4.3 02/29/2016 0556   VLDL 13 02/29/2016 0556   LDLCALC 109 (H) 02/29/2016 0556   Labs dated 07/23/16: creatinine 1.25. Otherwise CMET normal. Cholesterol 109, triglycerides 58, HDL 44, LDL 53 Dated 10/27/18: cholesterol 181, triglycerides 75, HDL 51, LDL 115.  Dated 12/20/19: creatinine 1.12. GFR 49, glucose 105. Otherwise CMET and CBC normal.    Additional studies/ records that were reviewed today include:   Cath 02/28/2016  Conclusion     Prox RCA to Mid RCA lesion, 20 %stenosed.  Prox LAD lesion, 25 %stenosed.  Mid LAD lesion, 30 %stenosed.  Prox Cx to Mid Cx lesion, 15 %stenosed.  A STENT SYNERGY DES 3X12 drug eluting stent was  successfully placed.  Dist RCA lesion, 95 %stenosed.  Post intervention, there is a 0% residual stenosis.  LV end diastolic pressure is normal.   1. 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 Normal LV EDP 3. Successful stenting of the distal RCA with a DES.  Plan: DAPT for one year. Check LV function with Echo. Anticipate DC tomorrow.     Echo 02/29/2016 LV EF: 55% -   60%  ------------------------------------------------------------------- Indications:      Chest pain 786.51.  ------------------------------------------------------------------- History:   PMH:   Non-ST-elevated myocardial infarction.  PMH: Stroke.  Risk factors:  Hypertension. Dyslipidemia.  ------------------------------------------------------------------- Study Conclusions  - Left ventricle: The cavity size was normal. Systolic function was   normal. The estimated ejection fraction was in the range of 55%   to 60%. Wall motion was normal; there were no regional wall   motion abnormalities. There was an increased relative   contribution of atrial contraction to ventricular filling.   Doppler parameters are consistent with abnormal left ventricular   relaxation (grade 1 diastolic dysfunction). - Aortic valve: There was mild regurgitation. - Right ventricle: Systolic function was mildly reduced.   ASSESSMENT:    No diagnosis found.   PLAN:  In order of problems listed above:  1. CAD with s/p  NSTEMI in September 2017: s/p DES of the distal RCA.  Continue ASA 81 mg daily.   2. HTN: Blood pressure well-controlled. Continue losartan HCT  3. HLD: intolerance to lipitor and  Crestor. on Zetia 10 mg daily. Previously unable to afford a PCSK 9 inhibitor. She should have lipid panel updated. If LDL still not at goal < 70 we should reinvestigate a PCSK9 inhibitor since she may have better insurance coverage now or be able to get some patient assistance.  Medication Adjustments/Labs and Tests  Ordered: Current medicines are reviewed at length with the patient today.  Concerns regarding medicines are outlined above.  Medication changes, Labs and Tests ordered today are listed in the Patient Instructions below. There are no Patient Instructions on file for this visit.   Signed, Rayven Hendrickson Martinique, MD ,[facility] 02/05/2020 3:42 PM    [facility] [address], [place], [place]  [address] Phone: [phone]; Fax: [phone]

## 2020-02-05 ENCOUNTER — Encounter: Payer: Self-pay | Admitting: Cardiology

## 2020-02-05 ENCOUNTER — Other Ambulatory Visit: Payer: Self-pay

## 2020-02-05 ENCOUNTER — Ambulatory Visit: Payer: Medicare Other | Admitting: Cardiology

## 2020-02-05 VITALS — BP 122/82 | HR 53 | Ht 62.0 in | Wt 173.6 lb

## 2020-02-05 DIAGNOSIS — I10 Essential (primary) hypertension: Secondary | ICD-10-CM | POA: Diagnosis not present

## 2020-02-05 DIAGNOSIS — I2510 Atherosclerotic heart disease of native coronary artery without angina pectoris: Secondary | ICD-10-CM

## 2020-02-05 DIAGNOSIS — Z9861 Coronary angioplasty status: Secondary | ICD-10-CM | POA: Diagnosis not present

## 2020-02-05 DIAGNOSIS — E785 Hyperlipidemia, unspecified: Secondary | ICD-10-CM

## 2020-03-04 ENCOUNTER — Other Ambulatory Visit: Payer: Self-pay | Admitting: *Deleted

## 2020-03-04 DIAGNOSIS — C182 Malignant neoplasm of ascending colon: Secondary | ICD-10-CM

## 2020-03-05 ENCOUNTER — Inpatient Hospital Stay: Payer: Medicare Other

## 2020-03-05 ENCOUNTER — Inpatient Hospital Stay: Payer: Medicare Other | Attending: Oncology

## 2020-03-05 ENCOUNTER — Other Ambulatory Visit: Payer: Self-pay

## 2020-03-05 DIAGNOSIS — Z85038 Personal history of other malignant neoplasm of large intestine: Secondary | ICD-10-CM | POA: Insufficient documentation

## 2020-03-05 DIAGNOSIS — C182 Malignant neoplasm of ascending colon: Secondary | ICD-10-CM

## 2020-03-05 DIAGNOSIS — Z95828 Presence of other vascular implants and grafts: Secondary | ICD-10-CM

## 2020-03-05 LAB — CEA (IN HOUSE-CHCC): CEA (CHCC-In House): 1.98 ng/mL (ref 0.00–5.00)

## 2020-03-05 MED ORDER — SODIUM CHLORIDE 0.9% FLUSH
10.0000 mL | INTRAVENOUS | Status: DC | PRN
  Administered 2020-03-05: 10 mL via INTRAVENOUS
  Filled 2020-03-05: qty 10

## 2020-03-05 MED ORDER — HEPARIN SOD (PORK) LOCK FLUSH 100 UNIT/ML IV SOLN
500.0000 [IU] | Freq: Once | INTRAVENOUS | Status: AC
  Administered 2020-03-05: 500 [IU] via INTRAVENOUS
  Filled 2020-03-05: qty 5

## 2020-03-05 NOTE — Patient Instructions (Signed)

## 2020-03-19 ENCOUNTER — Telehealth: Payer: Self-pay | Admitting: Oncology

## 2020-03-19 NOTE — Telephone Encounter (Signed)
Rescheduled appointments per 10/12 scheduling message. Spoke to patient who is aware of appointments date and times.

## 2020-04-23 ENCOUNTER — Other Ambulatory Visit: Payer: Medicare Other

## 2020-04-23 ENCOUNTER — Ambulatory Visit: Payer: Medicare Other | Admitting: Oncology

## 2020-04-26 ENCOUNTER — Inpatient Hospital Stay: Payer: Medicare Other | Attending: Oncology

## 2020-04-26 ENCOUNTER — Inpatient Hospital Stay: Payer: Medicare Other

## 2020-04-26 ENCOUNTER — Other Ambulatory Visit: Payer: Self-pay

## 2020-04-26 ENCOUNTER — Inpatient Hospital Stay: Payer: Medicare Other | Admitting: Oncology

## 2020-04-26 VITALS — BP 160/90 | HR 79 | Temp 98.3°F | Resp 17 | Ht 62.0 in | Wt 181.6 lb

## 2020-04-26 DIAGNOSIS — Z85038 Personal history of other malignant neoplasm of large intestine: Secondary | ICD-10-CM | POA: Diagnosis not present

## 2020-04-26 DIAGNOSIS — C182 Malignant neoplasm of ascending colon: Secondary | ICD-10-CM

## 2020-04-26 DIAGNOSIS — Z95828 Presence of other vascular implants and grafts: Secondary | ICD-10-CM

## 2020-04-26 DIAGNOSIS — Z9221 Personal history of antineoplastic chemotherapy: Secondary | ICD-10-CM | POA: Diagnosis not present

## 2020-04-26 DIAGNOSIS — E538 Deficiency of other specified B group vitamins: Secondary | ICD-10-CM

## 2020-04-26 LAB — VITAMIN B12: Vitamin B-12: 680 pg/mL (ref 180–914)

## 2020-04-26 LAB — CEA (IN HOUSE-CHCC): CEA (CHCC-In House): 2.22 ng/mL (ref 0.00–5.00)

## 2020-04-26 MED ORDER — SODIUM CHLORIDE 0.9% FLUSH
10.0000 mL | Freq: Once | INTRAVENOUS | Status: AC
  Administered 2020-04-26: 10 mL via INTRAVENOUS
  Filled 2020-04-26: qty 10

## 2020-04-26 MED ORDER — HEPARIN SOD (PORK) LOCK FLUSH 100 UNIT/ML IV SOLN
500.0000 [IU] | Freq: Once | INTRAVENOUS | Status: AC
  Administered 2020-04-26: 500 [IU] via INTRAVENOUS
  Filled 2020-04-26: qty 5

## 2020-04-26 NOTE — Patient Instructions (Signed)

## 2020-04-26 NOTE — Progress Notes (Signed)
 [facility]ESS [facility]   Diagnosis: Colon cancer  INTERVAL HISTORY:   [name] returns as scheduled.  She feels well in general.  Good appetite.  She continues to have discomfort in the toes and decreased grip strength in the hands.  No hand numbness.  She has not completed a surveillance colonoscopy.  She reports a history of chronic intermittent bleeding from a "hemorrhoid ".  Objective:  Vital signs in last 24 hours:  Blood pressure (!) 160/90, pulse 79, temperature 98.3 F (36.8 C), temperature source Tympanic, resp. rate 17, height 5' 2" (1.575 m), weight 181 lb 9.6 oz (82.4 kg), SpO2 100 %.   Lymphatics: No cervical, supraclavicular, axillary, or inguinal nodes.  Tenderness in the left axilla. Resp: Lungs clear bilaterally Cardio: Regular rate and rhythm GI: No mass, nontender, no hepatosplenomegaly Vascular: No leg edema   Portacath/PICC-without erythema  Lab Results:  Lab Results  Component Value Date   WBC 9.0 12/08/2019   HGB 13.0 12/08/2019   HCT 39.4 12/08/2019   MCV 92.7 12/08/2019   PLT 288 12/08/2019   NEUTROABS 6.4 12/08/2019    CMP  Lab Results  Component Value Date   NA 139 12/04/2019   K 4.4 12/04/2019   CL 101 12/04/2019   CO2 29 12/04/2019   GLUCOSE 101 (H) 12/04/2019   BUN 15 12/04/2019   CREATININE 1.09 (H) 12/04/2019   CALCIUM 10.4 (H) 12/04/2019   PROT 5.9 (L) 07/06/2019   ALBUMIN 2.9 (L) 07/06/2019   AST 19 07/06/2019   ALT 13 07/06/2019   ALKPHOS 66 07/06/2019   BILITOT 1.0 07/06/2019   GFRNONAA 50 (L) 12/04/2019   GFRAA 58 (L) 12/04/2019    Lab Results  Component Value Date   CEA1 2.22 04/26/2020    Lab Results  Component Value Date   INR 1.11 02/28/2016    Imaging:  No results found.  Medications: I have reviewed the patient's current medications.   Assessment/Plan: 1. Colon cancer(T3, N1b)  Colonoscopy 11/24/2018-large 5 cm malignant appearing fungating mass encompassing the  ileocecal valve was identified. Pathology showed invasive adenocarcinoma, moderately differentiated.   CT abdomen/pelvis 12/07/2018-eccentric wall thickening in the cecum, around the ileocecal valve, compatible with findings reported at the recent colonoscopy. Borderline enlarged adjacent lymph nodes in the ileocolic mesentery. No evidence for liver metastases.   CEA 01/13/2019-12.7  CEA 02/23/19 postop/at start of adjuvant chemo - 1.83  01/17/2019 right hemicolectomy by Dr. Newman. 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  Declined genetic testing on 02/20/19, patient indicated she may reconsider after completing adjuvant treatment. Seen by Karen Powell  CT chest 02/17/2019-tiny bilateral pulmonary nodules felt to be unlikely related to metastatic disease  PAC placement per Dr. Newman 02/22/2019   Cycle 1 adjuvant FOLFOX 02/23/19  Cycle 2 adjuvant FOLFOX 03/09/2019  Cycle 3 adjuvant FOLFOX 03/23/2019 (oxaliplatin dose reduced due to low normal neutrophil count)  Cycle 4 adjuvant FOLFOX 04/06/2019  Cycle 5 adjuvant FOLFOX 05/01/2019  Cycle 6 adjuvant FOLFOX 05/15/2019 (oxaliplatin held due to neuropathy)  CT 12/04/2019-stable right thyroid nodule, no evidence of metastatic disease, stable bilateral lung nodules  2. History of iron deficiency anemia 3. Hypertension 4. History of MI status post stent placement 5. 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 Anorexia/weight loss   8. Report of balance difficulty 9. B 12 deficiency-B12 weekly x4 beginning 12/09/2019.  On oral B12.      Disposition: [name] is in clinical remission from colon cancer.  We will follow up on the CEA from today.   I referred her to Dr. Earlean Shawl for a surveillance colonoscopy.  She would like to keep the Port-A-Cath in place until after she has a colonoscopy.  She will be scheduled for a Port-A-Cath flush in 6 weeks in 8 weeks.  Ms. Huneycutt will return for an office visit in 6 months.  She will be scheduled for surveillance CTs when she returns in 6 months.  Betsy Coder, MD  04/26/2020  1:47 PM

## 2020-04-29 ENCOUNTER — Other Ambulatory Visit: Payer: Self-pay | Admitting: Cardiology

## 2020-06-24 ENCOUNTER — Inpatient Hospital Stay: Payer: Medicare Other

## 2020-06-28 ENCOUNTER — Other Ambulatory Visit: Payer: Self-pay

## 2020-06-28 ENCOUNTER — Inpatient Hospital Stay: Payer: Medicare Other | Attending: Oncology

## 2020-06-28 DIAGNOSIS — E538 Deficiency of other specified B group vitamins: Secondary | ICD-10-CM

## 2020-06-28 DIAGNOSIS — Z85038 Personal history of other malignant neoplasm of large intestine: Secondary | ICD-10-CM | POA: Diagnosis not present

## 2020-06-28 DIAGNOSIS — Z452 Encounter for adjustment and management of vascular access device: Secondary | ICD-10-CM | POA: Insufficient documentation

## 2020-06-28 DIAGNOSIS — Z95828 Presence of other vascular implants and grafts: Secondary | ICD-10-CM

## 2020-06-28 DIAGNOSIS — C182 Malignant neoplasm of ascending colon: Secondary | ICD-10-CM

## 2020-06-28 MED ORDER — SODIUM CHLORIDE 0.9% FLUSH
10.0000 mL | Freq: Once | INTRAVENOUS | Status: AC
  Administered 2020-06-28: 10 mL
  Filled 2020-06-28: qty 10

## 2020-06-28 MED ORDER — HEPARIN SOD (PORK) LOCK FLUSH 100 UNIT/ML IV SOLN
500.0000 [IU] | Freq: Once | INTRAVENOUS | Status: AC
  Administered 2020-06-28: 500 [IU]
  Filled 2020-06-28: qty 5

## 2020-08-19 ENCOUNTER — Inpatient Hospital Stay: Payer: Medicare Other | Attending: Oncology

## 2020-08-19 ENCOUNTER — Other Ambulatory Visit: Payer: Self-pay

## 2020-08-19 DIAGNOSIS — Z452 Encounter for adjustment and management of vascular access device: Secondary | ICD-10-CM | POA: Insufficient documentation

## 2020-08-19 DIAGNOSIS — Z85038 Personal history of other malignant neoplasm of large intestine: Secondary | ICD-10-CM | POA: Insufficient documentation

## 2020-08-19 DIAGNOSIS — Z95828 Presence of other vascular implants and grafts: Secondary | ICD-10-CM

## 2020-08-19 DIAGNOSIS — C182 Malignant neoplasm of ascending colon: Secondary | ICD-10-CM

## 2020-08-19 DIAGNOSIS — E538 Deficiency of other specified B group vitamins: Secondary | ICD-10-CM

## 2020-08-19 MED ORDER — SODIUM CHLORIDE 0.9% FLUSH
10.0000 mL | Freq: Once | INTRAVENOUS | Status: AC
  Administered 2020-08-19: 10 mL
  Filled 2020-08-19: qty 10

## 2020-08-19 MED ORDER — HEPARIN SOD (PORK) LOCK FLUSH 100 UNIT/ML IV SOLN
500.0000 [IU] | Freq: Once | INTRAVENOUS | Status: AC
  Administered 2020-08-19: 500 [IU]
  Filled 2020-08-19: qty 5

## 2020-08-19 MED ORDER — CYANOCOBALAMIN 1000 MCG/ML IJ SOLN: 1000.0000 ug | Freq: Once | INTRAMUSCULAR | Status: DC

## 2020-09-02 ENCOUNTER — Telehealth: Payer: Self-pay | Admitting: Oncology

## 2020-09-02 NOTE — Telephone Encounter (Signed)
Calendar & letter has been mailed to the patient with updated address for appointments at [facility]  

## 2020-09-06 ENCOUNTER — Other Ambulatory Visit: Payer: Self-pay | Admitting: Cardiology

## 2020-10-14 ENCOUNTER — Inpatient Hospital Stay: Payer: Medicare Other

## 2020-10-14 ENCOUNTER — Other Ambulatory Visit: Payer: Medicare Other

## 2020-10-14 ENCOUNTER — Ambulatory Visit (HOSPITAL_BASED_OUTPATIENT_CLINIC_OR_DEPARTMENT_OTHER)
Admission: RE | Admit: 2020-10-14 | Discharge: 2020-10-14 | Disposition: A | Discharge status: Home / Self Care | Payer: Medicare Other | Source: Ambulatory Visit | Attending: Oncology | Admitting: Oncology

## 2020-10-14 ENCOUNTER — Ambulatory Visit: Payer: Medicare Other | Admitting: Nurse Practitioner

## 2020-10-14 ENCOUNTER — Other Ambulatory Visit: Payer: Self-pay

## 2020-10-14 ENCOUNTER — Inpatient Hospital Stay: Payer: Medicare Other | Attending: Oncology

## 2020-10-14 ENCOUNTER — Ambulatory Visit (HOSPITAL_BASED_OUTPATIENT_CLINIC_OR_DEPARTMENT_OTHER): Admission: RE | Admit: 2020-10-14 | Payer: Medicare Other | Source: Ambulatory Visit

## 2020-10-14 VITALS — BP 173/99 | HR 100 | Temp 98.3°F | Resp 20

## 2020-10-14 DIAGNOSIS — Z8673 Personal history of transient ischemic attack (TIA), and cerebral infarction without residual deficits: Secondary | ICD-10-CM | POA: Insufficient documentation

## 2020-10-14 DIAGNOSIS — E041 Nontoxic single thyroid nodule: Secondary | ICD-10-CM | POA: Insufficient documentation

## 2020-10-14 DIAGNOSIS — C182 Malignant neoplasm of ascending colon: Secondary | ICD-10-CM

## 2020-10-14 DIAGNOSIS — Z95828 Presence of other vascular implants and grafts: Secondary | ICD-10-CM

## 2020-10-14 DIAGNOSIS — I10 Essential (primary) hypertension: Secondary | ICD-10-CM | POA: Insufficient documentation

## 2020-10-14 DIAGNOSIS — E538 Deficiency of other specified B group vitamins: Secondary | ICD-10-CM

## 2020-10-14 DIAGNOSIS — I252 Old myocardial infarction: Secondary | ICD-10-CM | POA: Insufficient documentation

## 2020-10-14 DIAGNOSIS — Z79899 Other long term (current) drug therapy: Secondary | ICD-10-CM | POA: Diagnosis not present

## 2020-10-14 DIAGNOSIS — Z85038 Personal history of other malignant neoplasm of large intestine: Secondary | ICD-10-CM | POA: Diagnosis not present

## 2020-10-14 LAB — CBC WITH DIFFERENTIAL (CANCER CENTER ONLY)
Abs Immature Granulocytes: 0.02 10*3/uL (ref 0.00–0.07)
Basophils Absolute: 0.1 10*3/uL (ref 0.0–0.1)
Basophils Relative: 1 %
Eosinophils Absolute: 0.1 10*3/uL (ref 0.0–0.5)
Eosinophils Relative: 1 %
HCT: 39.9 % (ref 36.0–46.0)
Hemoglobin: 13.2 g/dL (ref 12.0–15.0)
Immature Granulocytes: 0 %
Lymphocytes Relative: 23 %
Lymphs Abs: 2.1 10*3/uL (ref 0.7–4.0)
MCH: 30.3 pg (ref 26.0–34.0)
MCHC: 33.1 g/dL (ref 30.0–36.0)
MCV: 91.7 fL (ref 80.0–100.0)
Monocytes Absolute: 0.6 10*3/uL (ref 0.1–1.0)
Monocytes Relative: 6 %
Neutro Abs: 6.4 10*3/uL (ref 1.7–7.7)
Neutrophils Relative %: 69 %
Platelet Count: 302 10*3/uL (ref 150–400)
RBC: 4.35 MIL/uL (ref 3.87–5.11)
RDW: 13.6 % (ref 11.5–15.5)
WBC Count: 9.3 10*3/uL (ref 4.0–10.5)
nRBC: 0 % (ref 0.0–0.2)

## 2020-10-14 LAB — CEA (ACCESS): CEA (CHCC): 2.23 ng/mL (ref 0.00–5.00)

## 2020-10-14 LAB — BASIC METABOLIC PANEL - CANCER CENTER ONLY
Anion gap: 11 (ref 5–15)
BUN: 17 mg/dL (ref 8–23)
CO2: 26 mmol/L (ref 22–32)
Calcium: 10 mg/dL (ref 8.9–10.3)
Chloride: 101 mmol/L (ref 98–111)
Creatinine: 0.91 mg/dL (ref 0.44–1.00)
GFR, Estimated: >60 mL/min (ref >60)
Glucose, Bld: 109 mg/dL — ABNORMAL HIGH (ref 70–99)
Potassium: 3.7 mmol/L (ref 3.5–5.1)
Sodium: 138 mmol/L (ref 135–145)

## 2020-10-14 MED ORDER — HEPARIN SOD (PORK) LOCK FLUSH 100 UNIT/ML IV SOLN
500.0000 [IU] | Freq: Once | INTRAVENOUS | Status: AC
  Administered 2020-10-14: 500 [IU]
  Filled 2020-10-14: qty 5

## 2020-10-14 MED ORDER — HEPARIN SOD (PORK) LOCK FLUSH 100 UNIT/ML IV SOLN
250.0000 [IU] | Freq: Once | INTRAVENOUS | Status: AC
  Filled 2020-10-14: qty 5

## 2020-10-14 MED ORDER — IOHEXOL 300 MG/ML  SOLN
90.0000 mL | Freq: Once | INTRAMUSCULAR | Status: AC | PRN
  Administered 2020-10-14: 90 mL via INTRAVENOUS

## 2020-10-14 MED ORDER — SODIUM CHLORIDE 0.9% FLUSH
10.0000 mL | Freq: Once | INTRAVENOUS | Status: AC
  Administered 2020-10-14: 10 mL
  Filled 2020-10-14: qty 10

## 2020-10-14 NOTE — Addendum Note (Signed)
Addended by: Tedd Sias on: 10/14/2020 04:34 PM   Modules accepted: Orders

## 2020-10-15 LAB — CEA (IN HOUSE-CHCC): CEA (CHCC-In House): 2.54 ng/mL (ref 0.00–5.00)

## 2020-10-17 ENCOUNTER — Other Ambulatory Visit: Payer: Self-pay

## 2020-10-17 ENCOUNTER — Inpatient Hospital Stay: Payer: Medicare Other | Admitting: Nurse Practitioner

## 2020-10-17 VITALS — BP 145/80 | HR 103 | Temp 98.0°F | Resp 18 | Ht 62.0 in | Wt 193.0 lb

## 2020-10-17 DIAGNOSIS — C182 Malignant neoplasm of ascending colon: Secondary | ICD-10-CM | POA: Diagnosis not present

## 2020-10-17 DIAGNOSIS — E041 Nontoxic single thyroid nodule: Secondary | ICD-10-CM

## 2020-10-17 DIAGNOSIS — Z85038 Personal history of other malignant neoplasm of large intestine: Secondary | ICD-10-CM | POA: Diagnosis not present

## 2020-10-17 NOTE — Progress Notes (Addendum)
[place] OFFICE PROGRESS NOTE   Diagnosis: Colon cancer  INTERVAL HISTORY:   [name] returns as scheduled.  No change in bowel habits.  She occasionally notes a small amount of blood after an episode of constipation.  She felt nauseated this morning and noticed a mild right-sided sore throat.  Prior to that no nausea.  She has not scheduled a surveillance colonoscopy.  She plans to do so in the near future.  She denies numbness/tingling in the hands or feet.  She continues to have difficulty with finger dexterity.  She reports recent right knee pain.  Objective:  Vital signs in last 24 hours:  Blood pressure (!) 145/80, pulse (!) 103, temperature 98 F (36.7 C), temperature source Oral, resp. rate 18, height _0  (1.575 m), weight 193 lb (87.5 kg), SpO2 100 %.    HEENT: Palpable nodule right neck, region of the thyroid.  Firm fullness just inferior to the palpable nodule. Lymphatics: No palpable supraclavicular, axillary or inguinal lymph nodes. Resp: Lungs clear bilaterally. Cardio: Regular rate and rhythm. GI: Abdomen soft and nontender.  No hepatomegaly.  No mass. Vascular: No leg edema.   Lab Results:  Lab Results  Component Value Date   WBC 9.3 10/14/2020   HGB 13.2 10/14/2020   HCT 39.9 10/14/2020   MCV 91.7 10/14/2020   PLT 302 10/14/2020   NEUTROABS 6.4 10/14/2020    Imaging:  No results found.  Medications: I have reviewed the patient's current medications.  Assessment/Plan: 1. Colon cancer(T3, N1b)  Colonoscopy 11/24/2018-large 5 cm malignant appearing fungating mass encompassing the ileocecal valve was identified. Pathology showed invasive adenocarcinoma, moderately differentiated.   CT abdomen/pelvis 12/07/2018-eccentric wall thickening in the cecum, around the ileocecal valve, compatible with findings reported at the recent colonoscopy. Borderline enlarged adjacent lymph nodes in the ileocolic mesentery. No evidence for liver  metastases.   CEA 01/13/2019-12.7  CEA 02/23/19 postop/at start of adjuvant chemo - 1.83  01/17/2019 right hemicolectomy by Dr. Lucia Gaskins. 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  Declined genetic testing on 02/20/19, patient indicated she may reconsider after completing adjuvant treatment. Seen by [name]  CT chest 02/17/2019-tiny bilateral pulmonary nodules felt to be unlikely related to metastatic disease  PAC placement per Dr. Lucia Gaskins 02/22/2019   Cycle 1 adjuvant FOLFOX 02/23/19  Cycle 2 adjuvant FOLFOX 03/09/2019  Cycle 3 adjuvant FOLFOX 03/23/2019 (oxaliplatin dose reduced due to low normal neutrophil count)  Cycle 4 adjuvant FOLFOX 04/06/2019  Cycle 5 adjuvant FOLFOX 05/01/2019  Cycle 6 adjuvant FOLFOX 05/15/2019 (oxaliplatin held due to neuropathy)  CT 12/04/2019-stable right thyroid nodule, no evidence of metastatic disease, stable bilateral lung nodules  CTs 10/15/2020- no evidence of metastatic disease chest, abdomen, pelvis.  Occasional tiny pulmonary nodules stable.  Interval enlargement of a nodule right lobe of the thyroid.  2. History of iron deficiency anemia 3. Hypertension 4. History of MI status post stent placement 5. 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 Anorexia/weight loss 8. Report of balance difficulty 9. B 12 deficiency-B12 weekly x4 beginning 12/09/2019.  On oral B12.   Disposition: [name] appears stable.  Recent surveillance CT scanning showed no evidence of metastatic disease in the chest, abdomen or pelvis.  She was noted to have interval enlargement of a nodule  right lobe of the thyroid.  On examination she has a palpable right neck nodule in the  region of the right thyroid; just inferior there is soft tissue fullness, question second nodule.  We made a referral for an ultrasound to further evaluate.  She will return for follow-up in 2 weeks to review the ultrasound results.  We are available to see her sooner if needed.  Patient seen with Dr. Benay Spice.  Ned Card ANP/GNP-BC   10/17/2020  2:38 PM  [name] was interviewed and examined.  There is a palpable nodule or nodules in the rt. Thyroid.  She will be referred for a diagnostic US.  I was present for greater than 50% of today's visit.  I performed medical decision making.  Cassie Manson, MD

## 2020-10-21 ENCOUNTER — Other Ambulatory Visit: Payer: Self-pay | Admitting: *Deleted

## 2020-10-21 DIAGNOSIS — E041 Nontoxic single thyroid nodule: Secondary | ICD-10-CM

## 2020-10-21 NOTE — Progress Notes (Signed)
Attempted to schedule [place] to assess thyroid/ neck nodule. Was instructed by radiology to change order to US Thyroid--this will also include the neck. Prior order will not image the neck even with comments about nodule. Order changed as requested and scan scheduled.  Patient notified of [place] at [facility] on 5/19 at 1045/1100. Provided phone # for central scheduling if they decide to reschedule.

## 2020-10-24 ENCOUNTER — Ambulatory Visit (HOSPITAL_BASED_OUTPATIENT_CLINIC_OR_DEPARTMENT_OTHER): Payer: Medicare Other

## 2020-10-31 ENCOUNTER — Ambulatory Visit (HOSPITAL_BASED_OUTPATIENT_CLINIC_OR_DEPARTMENT_OTHER)
Admission: RE | Admit: 2020-10-31 | Discharge: 2020-10-31 | Disposition: A | Discharge status: Home / Self Care | Payer: Medicare Other | Source: Ambulatory Visit | Attending: Oncology | Admitting: Oncology

## 2020-10-31 ENCOUNTER — Encounter: Payer: Self-pay | Admitting: Nurse Practitioner

## 2020-10-31 ENCOUNTER — Inpatient Hospital Stay: Payer: Medicare Other | Admitting: Nurse Practitioner

## 2020-10-31 ENCOUNTER — Other Ambulatory Visit: Payer: Self-pay

## 2020-10-31 VITALS — BP 134/80 | HR 92 | Temp 98.0°F | Resp 18 | Ht 62.0 in | Wt 193.8 lb

## 2020-10-31 DIAGNOSIS — C182 Malignant neoplasm of ascending colon: Secondary | ICD-10-CM

## 2020-10-31 DIAGNOSIS — E041 Nontoxic single thyroid nodule: Secondary | ICD-10-CM | POA: Diagnosis present

## 2020-10-31 DIAGNOSIS — Z85038 Personal history of other malignant neoplasm of large intestine: Secondary | ICD-10-CM | POA: Diagnosis not present

## 2020-10-31 NOTE — Progress Notes (Signed)
[place] OFFICE PROGRESS NOTE   Diagnosis: Colon cancer  INTERVAL HISTORY:   [name] returns as scheduled to review the results of a thyroid ultrasound.  Unfortunately she came to our office this morning instead of radiology and has not had the ultrasound completed.  She reports coughing and congestion for the past week.  No fever.  No shortness of breath.  The sore throat she complained of at the time of her last visit has resolved.   Objective:  Vital signs in last 24 hours:  Blood pressure 134/80, pulse 92, temperature 98 F (36.7 C), temperature source Oral, resp. rate 18, height 5' 2"  (1.575 m), weight 193 lb 12.8 oz (87.9 kg), SpO2 100 %.    HEENT: Palpable nodule right neck with firm fullness just inferior. Resp: Lungs clear bilaterally. Cardio: Regular rate and rhythm. GI: Abdomen soft and nontender.  No hepatomegaly. Vascular: No leg edema. Port-A-Cath without erythema.  Lab Results:  Lab Results  Component Value Date   WBC 9.3 10/14/2020   HGB 13.2 10/14/2020   HCT 39.9 10/14/2020   MCV 91.7 10/14/2020   PLT 302 10/14/2020   NEUTROABS 6.4 10/14/2020    Imaging:  No results found.  Medications: I have reviewed the patient's current medications.  Assessment/Plan: 1. Colon cancer(T3, N1b)  Colonoscopy 11/24/2018-large 5 cm malignant appearing fungating mass encompassing the ileocecal valve was identified. Pathology showed invasive adenocarcinoma, moderately differentiated.   CT abdomen/pelvis 12/07/2018-eccentric wall thickening in the cecum, around the ileocecal valve, compatible with findings reported at the recent colonoscopy. Borderline enlarged adjacent lymph nodes in the ileocolic mesentery. No evidence for liver metastases.   CEA 01/13/2019-12.7  CEA 02/23/19 postop/at start of adjuvant chemo - 1.83  01/17/2019 right hemicolectomy by Dr. Lucia Gaskins. Final pathology showed invasive colonic adenocarcinoma, 3.3 cm; tumor invades  through the muscularis propria into pericolonic tissue; margins not involved; metastatic carcinoma present in 2 of 18 lymph nodes; lymphovascular invasion present; perineural invasion not identified; mismatch repair protein (IHC) normal; MSI stable.  Declined genetic testing on 02/20/19, patient indicated she may reconsider after completing adjuvant treatment. Seen by Roma Kayser  CT chest 02/17/2019-tiny bilateral pulmonary nodules felt to be unlikely related to metastatic disease  PAC placement per Dr. Lucia Gaskins 02/22/2019   Cycle 1 adjuvant FOLFOX 02/23/19  Cycle 2 adjuvant FOLFOX 03/09/2019  Cycle 3 adjuvant FOLFOX 03/23/2019 (oxaliplatin dose reduced due to low normal neutrophil count)  Cycle 4 adjuvant FOLFOX 04/06/2019  Cycle 5 adjuvant FOLFOX 05/01/2019  Cycle 6 adjuvant FOLFOX 05/15/2019 (oxaliplatin held due to neuropathy)  CT 12/04/2019-stable right thyroid nodule, no evidence of metastatic disease, stable bilateral lung nodules  CTs 10/15/2020- no evidence of metastatic disease chest, abdomen, pelvis.  Occasional tiny pulmonary nodules stable.  Interval enlargement of a nodule right lobe of the thyroid.  2. History of iron deficiency anemia 3. Hypertension 4. History of MI status post stent placement 5. 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 Anorexia/weight loss 8. Report of balance difficulty 9. B 12 deficiency-B12 weekly x4 beginning 12/09/2019. On oral B12. 10. Right thyroid nodule on CT 10/15/2020.  Referred for thyroid ultrasound.   Disposition: [name] appears stable.  She was returning today to review the results of a thyroid ultrasound. Unfortunately this has not been completed.  We will contact her once the results are available.  She has had coughing and congestion for the past week.  No fever.  I recommended she complete a COVID test.  She is scheduled  for routine port flushes.  Next office visit approximately 6 months.  As noted above we will  follow-up on the thyroid ultrasound results.   Ned Card ANP/GNP-BC   10/31/2020  10:27 AM

## 2020-11-05 ENCOUNTER — Other Ambulatory Visit: Payer: Self-pay | Admitting: Nurse Practitioner

## 2020-11-05 ENCOUNTER — Telehealth: Payer: Self-pay

## 2020-11-05 DIAGNOSIS — C182 Malignant neoplasm of ascending colon: Secondary | ICD-10-CM

## 2020-11-05 DIAGNOSIS — E041 Nontoxic single thyroid nodule: Secondary | ICD-10-CM

## 2020-11-05 NOTE — Telephone Encounter (Signed)
Called/ no answer message left to make pt aware of referral placed for biopsy, encouraged to call for questions concerns or changes

## 2020-11-05 NOTE — Telephone Encounter (Signed)
-----   Message from Owens Shark, NP sent at 11/05/2020  2:51 PM EDT ----- Please let her know the recommendation is to biopsy right inferior thyroid nodule.  I am making a referral.

## 2020-11-07 ENCOUNTER — Telehealth: Payer: Self-pay

## 2020-11-07 NOTE — Telephone Encounter (Signed)
Learned call has to be placed with [place] imaging received number called to schedule

## 2020-11-07 NOTE — Telephone Encounter (Signed)
Called to schedule thyroid biopsy clinic will call pt directly and schedule appt

## 2020-11-07 NOTE — Telephone Encounter (Signed)
-----   Message from Owens Shark, NP sent at 11/06/2020 12:11 PM EDT ----- Please follow-up on the thyroid biopsy.  Once this has been scheduled she will need a follow-up appointment with Dr. Benay Spice 2 to 3 days later.

## 2020-11-12 ENCOUNTER — Other Ambulatory Visit (HOSPITAL_COMMUNITY)
Admission: RE | Admit: 2020-11-12 | Discharge: 2020-11-12 | Disposition: A | Discharge status: Home / Self Care | Payer: Medicare Other | Source: Ambulatory Visit | Attending: Radiology | Admitting: Radiology

## 2020-11-12 ENCOUNTER — Ambulatory Visit
Admission: RE | Admit: 2020-11-12 | Discharge: 2020-11-12 | Disposition: A | Discharge status: Home / Self Care | Payer: Medicare Other | Source: Ambulatory Visit | Attending: Nurse Practitioner | Admitting: Nurse Practitioner

## 2020-11-12 DIAGNOSIS — C182 Malignant neoplasm of ascending colon: Secondary | ICD-10-CM | POA: Diagnosis not present

## 2020-11-12 DIAGNOSIS — E041 Nontoxic single thyroid nodule: Secondary | ICD-10-CM

## 2020-11-12 DIAGNOSIS — D440 Neoplasm of uncertain behavior of thyroid gland: Secondary | ICD-10-CM | POA: Insufficient documentation

## 2020-11-13 LAB — CYTOLOGY - NON PAP

## 2020-11-22 ENCOUNTER — Ambulatory Visit: Payer: Medicare Other | Admitting: Nurse Practitioner

## 2020-11-25 ENCOUNTER — Other Ambulatory Visit: Payer: Self-pay

## 2020-11-25 ENCOUNTER — Inpatient Hospital Stay: Payer: Medicare Other

## 2020-11-25 ENCOUNTER — Inpatient Hospital Stay: Payer: Medicare Other | Attending: Oncology | Admitting: Nurse Practitioner

## 2020-11-25 ENCOUNTER — Encounter: Payer: Self-pay | Admitting: Nurse Practitioner

## 2020-11-25 VITALS — BP 140/80 | HR 86 | Temp 98.2°F | Resp 18 | Ht 62.0 in | Wt 195.6 lb

## 2020-11-25 DIAGNOSIS — I252 Old myocardial infarction: Secondary | ICD-10-CM | POA: Insufficient documentation

## 2020-11-25 DIAGNOSIS — I10 Essential (primary) hypertension: Secondary | ICD-10-CM | POA: Insufficient documentation

## 2020-11-25 DIAGNOSIS — Z8673 Personal history of transient ischemic attack (TIA), and cerebral infarction without residual deficits: Secondary | ICD-10-CM | POA: Diagnosis not present

## 2020-11-25 DIAGNOSIS — E041 Nontoxic single thyroid nodule: Secondary | ICD-10-CM | POA: Diagnosis not present

## 2020-11-25 DIAGNOSIS — C182 Malignant neoplasm of ascending colon: Secondary | ICD-10-CM | POA: Diagnosis not present

## 2020-11-25 DIAGNOSIS — Z85038 Personal history of other malignant neoplasm of large intestine: Secondary | ICD-10-CM | POA: Diagnosis present

## 2020-11-25 NOTE — Progress Notes (Signed)
[place] OFFICE PROGRESS NOTE   Diagnosis: Colon cancer  INTERVAL HISTORY:   Ms. Simms returns as scheduled.  She complains of significant discomfort with the thyroid biopsy procedure.    No dysphagia.  She has occasional constipation.  Objective:  Vital signs in last 24 hours:  Blood pressure 140/80, pulse 86, temperature 98.2 F (36.8 C), temperature source Oral, resp. rate 18, height 5' 2"  (1.575 m), weight 195 lb 9.6 oz (88.7 kg), SpO2 100 %.    HEENT: Palpable nodule right neck with firm fullness just inferior. Resp: Lungs clear bilaterally. Cardio: Regular rate and rhythm. GI: Abdomen soft and nontender.  No hepatomegaly. Vascular: No leg edema. Port-A-Cath without erythema.  Lab Results:  Lab Results  Component Value Date   WBC 9.3 10/14/2020   HGB 13.2 10/14/2020   HCT 39.9 10/14/2020   MCV 91.7 10/14/2020   PLT 302 10/14/2020   NEUTROABS 6.4 10/14/2020    Imaging:  No results found.  Medications: I have reviewed the patient's current medications.  Assessment/Plan: Colon cancer (T3, N1b) Colonoscopy 11/24/2018-large 5 cm malignant appearing fungating mass encompassing the ileocecal valve was identified.  Pathology showed invasive adenocarcinoma, moderately differentiated.   CT abdomen/pelvis 12/07/2018-eccentric wall thickening in the cecum, around the ileocecal valve, compatible with findings reported at the recent colonoscopy.  Borderline enlarged adjacent lymph nodes in the ileocolic mesentery.  No evidence for liver metastases.   CEA 01/13/2019-12.7 CEA 02/23/19 postop/at start of adjuvant chemo - 1.83 01/17/2019 right hemicolectomy by Dr. Lucia Gaskins.  Final pathology showed invasive colonic adenocarcinoma, 3.3 cm; tumor invades through the muscularis propria into pericolonic tissue; margins not involved; metastatic carcinoma present in 2 of 18 lymph nodes; lymphovascular invasion present; perineural invasion not identified; mismatch repair  protein (IHC) normal; MSI stable. Declined genetic testing on 02/20/19, patient indicated she may reconsider after completing adjuvant treatment. Seen by Roma Kayser CT chest 02/17/2019-tiny bilateral pulmonary nodules felt to be unlikely related to metastatic disease PAC placement per Dr. Lucia Gaskins 02/22/2019 Cycle 1 adjuvant FOLFOX 02/23/19  Cycle 2 adjuvant FOLFOX 03/09/2019 Cycle 3 adjuvant FOLFOX 03/23/2019 (oxaliplatin dose reduced due to low normal neutrophil count) Cycle 4 adjuvant FOLFOX 04/06/2019 Cycle 5 adjuvant FOLFOX 05/01/2019 Cycle 6 adjuvant FOLFOX 05/15/2019 (oxaliplatin held due to neuropathy) CT 12/04/2019-stable right thyroid nodule, no evidence of metastatic disease, stable bilateral lung nodules CTs 10/15/2020- no evidence of metastatic disease chest, abdomen, pelvis.  Occasional tiny pulmonary nodules stable.  Interval enlargement of a nodule right lobe of the thyroid.   History of iron deficiency anemia Hypertension History of MI status post stent placement History of stroke 1996 Oxaliplatin neuropathy Anorexia/weight loss Report of balance difficulty B 12 deficiency-B12 weekly x4 beginning 12/09/2019.  On oral B12. Right thyroid nodule on CT 10/15/2020.  Referred for thyroid ultrasound.  Thyroid ultrasound 10/31/2020-3.3 cm right inferior TR 3 nodule meets criteria for biopsy, this correlates with the chest CT finding.  Additional 1.5 cm right superior T3 nodule meets criteria for follow-up in 1 year.  FNA biopsy of right inferior thyroid lesion 08/08/2023-[facility] follicular epithelium present.  Nondiagnostic material.    Disposition: Ms. Wolfgang appears unchanged.  Biopsy of the right inferior thyroid lesion returned nondiagnostic.  We discussed a repeat biopsy of the thyroid nodule versus evaluation by Dr. Harlow Asa.  She would like to meet with Dr. Harlow Asa.  In addition to discussing biopsy of the thyroid nodule she would like to discuss Port-A-Cath removal.  If the Port-A-Cath  remains in place she will  continue to have flushes done every 6 to 8 weeks through our office.  We are scheduled to see her in follow-up in November.  We are available to see her sooner if needed.  Patient seen with Dr. Benay Spice.    Ned Card ANP/GNP-BC   11/25/2020  1:26 PM This was a shared visit with Ned Card.  Ms. Vincelette underwent a nondiagnostic biopsy of the inferior right thyroid nodule.  We discussed options with her including a repeat biopsy and referral for consultation with Dr. Harlow Asa.  She agrees to a consult with Dr. Harlow Asa for evaluation of the thyroid nodule and Port-A-Cath removal.  She remains in clinical remission from colon cancer.  I was present for greater than 50% of today's visit.  I performed medical decision making.  Julieanne Manson, MD

## 2020-11-26 ENCOUNTER — Encounter: Payer: Self-pay | Admitting: *Deleted

## 2020-11-26 NOTE — Progress Notes (Signed)
Faxed referral order, demographics and chart information to [facility] for Dr. Armandina Gemma to see [id number] mass removal.

## 2020-11-28 ENCOUNTER — Other Ambulatory Visit: Payer: Self-pay | Admitting: Nurse Practitioner

## 2020-11-28 ENCOUNTER — Telehealth: Payer: Self-pay

## 2020-11-28 DIAGNOSIS — E041 Nontoxic single thyroid nodule: Secondary | ICD-10-CM

## 2020-11-28 NOTE — Telephone Encounter (Signed)
V/M message from Pt stating she was in to see Leander Rams NP a couple days ago and decided she would do the imaging again for thyroid Message relayed to Leander Rams NP.

## 2020-12-10 ENCOUNTER — Inpatient Hospital Stay: Payer: Medicare Other | Attending: Oncology

## 2020-12-10 ENCOUNTER — Other Ambulatory Visit: Payer: Self-pay

## 2020-12-10 VITALS — BP 175/73 | HR 93 | Temp 98.4°F | Resp 18

## 2020-12-10 DIAGNOSIS — Z452 Encounter for adjustment and management of vascular access device: Secondary | ICD-10-CM | POA: Insufficient documentation

## 2020-12-10 DIAGNOSIS — C182 Malignant neoplasm of ascending colon: Secondary | ICD-10-CM

## 2020-12-10 DIAGNOSIS — Z85038 Personal history of other malignant neoplasm of large intestine: Secondary | ICD-10-CM | POA: Insufficient documentation

## 2020-12-10 DIAGNOSIS — Z95828 Presence of other vascular implants and grafts: Secondary | ICD-10-CM

## 2020-12-10 DIAGNOSIS — E538 Deficiency of other specified B group vitamins: Secondary | ICD-10-CM

## 2020-12-10 MED ORDER — HEPARIN SOD (PORK) LOCK FLUSH 100 UNIT/ML IV SOLN
500.0000 [IU] | Freq: Once | INTRAVENOUS | Status: AC
  Administered 2020-12-10: 500 [IU]
  Filled 2020-12-10: qty 5

## 2020-12-10 MED ORDER — SODIUM CHLORIDE 0.9% FLUSH
10.0000 mL | Freq: Once | INTRAVENOUS | Status: AC
  Administered 2020-12-10: 10 mL
  Filled 2020-12-10: qty 10

## 2020-12-17 ENCOUNTER — Other Ambulatory Visit (HOSPITAL_COMMUNITY)
Admission: RE | Admit: 2020-12-17 | Discharge: 2020-12-17 | Disposition: A | Discharge status: Home / Self Care | Payer: Medicare Other | Source: Ambulatory Visit | Attending: Nurse Practitioner | Admitting: Nurse Practitioner

## 2020-12-17 ENCOUNTER — Ambulatory Visit
Admission: RE | Admit: 2020-12-17 | Discharge: 2020-12-17 | Disposition: A | Discharge status: Home / Self Care | Payer: Medicare Other | Source: Ambulatory Visit | Attending: Nurse Practitioner | Admitting: Nurse Practitioner

## 2020-12-17 DIAGNOSIS — E041 Nontoxic single thyroid nodule: Secondary | ICD-10-CM

## 2020-12-19 LAB — CYTOLOGY - NON PAP

## 2020-12-20 ENCOUNTER — Telehealth: Payer: Self-pay

## 2020-12-20 NOTE — Telephone Encounter (Signed)
path report faxed to [place].  16 pages total.

## 2020-12-26 ENCOUNTER — Encounter: Payer: Self-pay | Admitting: *Deleted

## 2020-12-26 NOTE — Progress Notes (Signed)
Faxed referral order, demographics and chart information for appointment at [facility] for thyroid mass and to discuss removal of port. Sent staff message to referral coordinator as well.

## 2020-12-27 ENCOUNTER — Telehealth: Payer: Self-pay | Admitting: Nurse Practitioner

## 2020-12-27 NOTE — Telephone Encounter (Signed)
I returned [name]'s call.  There was no answer.  I left a message requesting she call us back.

## 2020-12-27 NOTE — Telephone Encounter (Signed)
I attempted to call Ms. Nute to discuss the thyroid biopsy result and rationale for referral to surgery.  Call went to voicemail.  Message left to please call back.

## 2020-12-27 NOTE — Telephone Encounter (Signed)
I spoke with [name] regarding the result of the second thyroid biopsy.  We discussed Dr. Gearldine Shown recommendation that she follow-up with Dr. Harlow Asa regarding the thyroid nodule as well as Port-A-Cath removal (referral placed previously).  She was provided with the phone number to [facility].

## 2021-01-07 ENCOUNTER — Encounter (HOSPITAL_COMMUNITY): Payer: Self-pay

## 2021-01-23 DIAGNOSIS — E042 Nontoxic multinodular goiter: Secondary | ICD-10-CM | POA: Insufficient documentation

## 2021-01-23 DIAGNOSIS — Z85038 Personal history of other malignant neoplasm of large intestine: Secondary | ICD-10-CM | POA: Insufficient documentation

## 2021-02-03 ENCOUNTER — Other Ambulatory Visit: Payer: Self-pay

## 2021-02-03 ENCOUNTER — Inpatient Hospital Stay: Payer: Medicare Other | Attending: Oncology

## 2021-02-03 VITALS — BP 169/74 | HR 100 | Temp 98.2°F | Resp 18

## 2021-02-03 DIAGNOSIS — Z452 Encounter for adjustment and management of vascular access device: Secondary | ICD-10-CM | POA: Insufficient documentation

## 2021-02-03 DIAGNOSIS — Z85038 Personal history of other malignant neoplasm of large intestine: Secondary | ICD-10-CM | POA: Diagnosis present

## 2021-02-03 DIAGNOSIS — Z95828 Presence of other vascular implants and grafts: Secondary | ICD-10-CM

## 2021-02-03 DIAGNOSIS — E538 Deficiency of other specified B group vitamins: Secondary | ICD-10-CM

## 2021-02-03 DIAGNOSIS — C182 Malignant neoplasm of ascending colon: Secondary | ICD-10-CM

## 2021-02-03 MED ORDER — HEPARIN SOD (PORK) LOCK FLUSH 100 UNIT/ML IV SOLN
500.0000 [IU] | Freq: Once | INTRAVENOUS | Status: AC
  Administered 2021-02-03: 500 [IU]

## 2021-02-03 MED ORDER — SODIUM CHLORIDE 0.9% FLUSH
10.0000 mL | Freq: Once | INTRAVENOUS | Status: AC
  Administered 2021-02-03: 10 mL

## 2021-02-03 NOTE — Patient Instructions (Signed)
Implanted Port Home Guide An implanted port is a device that is placed under the skin. It is usually placed in the chest. The device can be used to give IV medicine, to take blood, or for dialysis. You may have an implanted port if: You need IV medicine that would be irritating to the small veins in your hands or arms. You need IV medicines, such as antibiotics, for a long period of time. You need IV nutrition for a long period of time. You need dialysis. When you have a port, your health care provider can choose to use the port instead of veins in your arms for these procedures. You may have fewer limitations when using a port than you would if you used other types of long-term IVs, and you will likely be able to return to normal activities afteryour incision heals. An implanted port has two main parts: Reservoir. The reservoir is the part where a needle is inserted to give medicines or draw blood. The reservoir is round. After it is placed, it appears as a small, raised area under your skin. Catheter. The catheter is a thin, flexible tube that connects the reservoir to a vein. Medicine that is inserted into the reservoir goes into the catheter and then into the vein. How is my port accessed? To access your port: A numbing cream may be placed on the skin over the port site. Your health care provider will put on a mask and sterile gloves. The skin over your port will be cleaned carefully with a germ-killing soap and allowed to dry. Your health care provider will gently pinch the port and insert a needle into it. Your health care provider will check for a blood return to make sure the port is in the vein and is not clogged. If your port needs to remain accessed to get medicine continuously (constant infusion), your health care provider will place a clear bandage (dressing) over the needle site. The dressing and needle will need to be changed every week, or as told by your health care provider. What  is flushing? Flushing helps keep the port from getting clogged. Follow instructions from your health care provider about how and when to flush the port. Ports are usually flushed with saline solution or a medicine called heparin. The need for flushing will depend on how the port is used: If the port is only used from time to time to give medicines or draw blood, the port may need to be flushed: Before and after medicines have been given. Before and after blood has been drawn. As part of routine maintenance. Flushing may be recommended every 4-6 weeks. If a constant infusion is running, the port may not need to be flushed. Throw away any syringes in a disposal container that is meant for sharp items (sharps container). You can buy a sharps container from a pharmacy, or you can make one by using an empty hard plastic bottle with a cover. How long will my port stay implanted? The port can stay in for as long as your health care provider thinks it is needed. When it is time for the port to come out, a surgery will be done to remove it. The surgery will be similar to the procedure that was done to putthe port in. Follow these instructions at home:  Flush your port as told by your health care provider. If you need an infusion over several days, follow instructions from your health care provider about how to take   care of your port site. Make sure you: Wash your hands with soap and water before you change your dressing. If soap and water are not available, use alcohol-based hand sanitizer. Change your dressing as told by your health care provider. Place any used dressings or infusion bags into a plastic bag. Throw that bag in the trash. Keep the dressing that covers the needle clean and dry. Do not get it wet. Do not use scissors or sharp objects near the tube. Keep the tube clamped, unless it is being used. Check your port site every day for signs of infection. Check for: Redness, swelling, or  pain. Fluid or blood. Pus or a bad smell. Protect the skin around the port site. Avoid wearing bra straps that rub or irritate the site. Protect the skin around your port from seat belts. Place a soft pad over your chest if needed. Bathe or shower as told by your health care provider. The site may get wet as long as you are not actively receiving an infusion. Return to your normal activities as told by your health care provider. Ask your health care provider what activities are safe for you. Carry a medical alert card or wear a medical alert bracelet at all times. This will let health care providers know that you have an implanted port in case of an emergency. Get help right away if: You have redness, swelling, or pain at the port site. You have fluid or blood coming from your port site. You have pus or a bad smell coming from the port site. You have a fever. Summary Implanted ports are usually placed in the chest for long-term IV access. Follow instructions from your health care provider about flushing the port and changing bandages (dressings). Take care of the area around your port by avoiding clothing that puts pressure on the area, and by watching for signs of infection. Protect the skin around your port from seat belts. Place a soft pad over your chest if needed. Get help right away if you have a fever or you have redness, swelling, pain, drainage, or a bad smell at the port site. This information is not intended to replace advice given to you by your health care provider. Make sure you discuss any questions you have with your healthcare provider. Document Revised: 10/09/2019 Document Reviewed: 10/09/2019 Elsevier Patient Education  2022 [employer]  

## 2021-02-11 ENCOUNTER — Other Ambulatory Visit: Payer: Self-pay | Admitting: Cardiology

## 2021-03-24 ENCOUNTER — Inpatient Hospital Stay: Payer: Medicare Other | Attending: Oncology

## 2021-03-24 ENCOUNTER — Other Ambulatory Visit: Payer: Self-pay

## 2021-03-24 VITALS — BP 140/73 | HR 83 | Temp 98.2°F | Resp 18

## 2021-03-24 DIAGNOSIS — C182 Malignant neoplasm of ascending colon: Secondary | ICD-10-CM

## 2021-03-24 DIAGNOSIS — Z85038 Personal history of other malignant neoplasm of large intestine: Secondary | ICD-10-CM | POA: Insufficient documentation

## 2021-03-24 DIAGNOSIS — Z452 Encounter for adjustment and management of vascular access device: Secondary | ICD-10-CM | POA: Diagnosis present

## 2021-03-24 DIAGNOSIS — Z95828 Presence of other vascular implants and grafts: Secondary | ICD-10-CM

## 2021-03-24 DIAGNOSIS — E538 Deficiency of other specified B group vitamins: Secondary | ICD-10-CM

## 2021-03-24 MED ORDER — HEPARIN SOD (PORK) LOCK FLUSH 100 UNIT/ML IV SOLN
500.0000 [IU] | Freq: Once | INTRAVENOUS | Status: AC
  Administered 2021-03-24: 500 [IU]

## 2021-03-24 MED ORDER — SODIUM CHLORIDE 0.9% FLUSH
10.0000 mL | Freq: Once | INTRAVENOUS | Status: AC
  Administered 2021-03-24: 10 mL

## 2021-03-24 NOTE — Patient Instructions (Signed)
Implanted Port Home Guide An implanted port is a device that is placed under the skin. It is usually placed in the chest. The device can be used to give IV medicine, to take blood, or for dialysis. You may have an implanted port if: You need IV medicine that would be irritating to the small veins in your hands or arms. You need IV medicines, such as antibiotics, for a long period of time. You need IV nutrition for a long period of time. You need dialysis. When you have a port, your health care provider can choose to use the port instead of veins in your arms for these procedures. You may have fewer limitations when using a port than you would if you used other types of long-term IVs, and you will likely be able to return to normal activities after your incision heals. An implanted port has two main parts: Reservoir. The reservoir is the part where a needle is inserted to give medicines or draw blood. The reservoir is round. After it is placed, it appears as a small, raised area under your skin. Catheter. The catheter is a thin, flexible tube that connects the reservoir to a vein. Medicine that is inserted into the reservoir goes into the catheter and then into the vein. How is my port accessed? To access your port: A numbing cream may be placed on the skin over the port site. Your health care provider will put on a mask and sterile gloves. The skin over your port will be cleaned carefully with a germ-killing soap and allowed to dry. Your health care provider will gently pinch the port and insert a needle into it. Your health care provider will check for a blood return to make sure the port is in the vein and is not clogged. If your port needs to remain accessed to get medicine continuously (constant infusion), your health care provider will place a clear bandage (dressing) over the needle site. The dressing and needle will need to be changed every week, or as told by your health care provider. What  is flushing? Flushing helps keep the port from getting clogged. Follow instructions from your health care provider about how and when to flush the port. Ports are usually flushed with saline solution or a medicine called heparin. The need for flushing will depend on how the port is used: If the port is only used from time to time to give medicines or draw blood, the port may need to be flushed: Before and after medicines have been given. Before and after blood has been drawn. As part of routine maintenance. Flushing may be recommended every 4-6 weeks. If a constant infusion is running, the port may not need to be flushed. Throw away any syringes in a disposal container that is meant for sharp items (sharps container). You can buy a sharps container from a pharmacy, or you can make one by using an empty hard plastic bottle with a cover. How long will my port stay implanted? The port can stay in for as long as your health care provider thinks it is needed. When it is time for the port to come out, a surgery will be done to remove it. The surgery will be similar to the procedure that was done to put the port in. Follow these instructions at home:  Flush your port as told by your health care provider. If you need an infusion over several days, follow instructions from your health care provider about how   to take care of your port site. Make sure you: Wash your hands with soap and water before you change your dressing. If soap and water are not available, use alcohol-based hand sanitizer. Change your dressing as told by your health care provider. Place any used dressings or infusion bags into a plastic bag. Throw that bag in the trash. Keep the dressing that covers the needle clean and dry. Do not get it wet. Do not use scissors or sharp objects near the tube. Keep the tube clamped, unless it is being used. Check your port site every day for signs of infection. Check for: Redness, swelling, or  pain. Fluid or blood. Pus or a bad smell. Protect the skin around the port site. Avoid wearing bra straps that rub or irritate the site. Protect the skin around your port from seat belts. Place a soft pad over your chest if needed. Bathe or shower as told by your health care provider. The site may get wet as long as you are not actively receiving an infusion. Return to your normal activities as told by your health care provider. Ask your health care provider what activities are safe for you. Carry a medical alert card or wear a medical alert bracelet at all times. This will let health care providers know that you have an implanted port in case of an emergency. Get help right away if: You have redness, swelling, or pain at the port site. You have fluid or blood coming from your port site. You have pus or a bad smell coming from the port site. You have a fever. Summary Implanted ports are usually placed in the chest for long-term IV access. Follow instructions from your health care provider about flushing the port and changing bandages (dressings). Take care of the area around your port by avoiding clothing that puts pressure on the area, and by watching for signs of infection. Protect the skin around your port from seat belts. Place a soft pad over your chest if needed. Get help right away if you have a fever or you have redness, swelling, pain, drainage, or a bad smell at the port site. This information is not intended to replace advice given to you by your health care provider. Make sure you discuss any questions you have with your health care provider. Document Revised: 08/14/2020 Document Reviewed: 10/09/2019 Elsevier Patient Education  2022 [employer]  

## 2021-04-18 ENCOUNTER — Other Ambulatory Visit: Payer: Self-pay

## 2021-04-18 ENCOUNTER — Inpatient Hospital Stay: Payer: Medicare Other

## 2021-04-18 ENCOUNTER — Inpatient Hospital Stay: Payer: Medicare Other | Attending: Oncology | Admitting: Oncology

## 2021-04-18 VITALS — BP 145/80 | HR 91 | Temp 98.7°F | Resp 18 | Ht 62.0 in | Wt 195.0 lb

## 2021-04-18 DIAGNOSIS — C182 Malignant neoplasm of ascending colon: Secondary | ICD-10-CM

## 2021-04-18 DIAGNOSIS — Z452 Encounter for adjustment and management of vascular access device: Secondary | ICD-10-CM | POA: Insufficient documentation

## 2021-04-18 DIAGNOSIS — E041 Nontoxic single thyroid nodule: Secondary | ICD-10-CM

## 2021-04-18 DIAGNOSIS — E538 Deficiency of other specified B group vitamins: Secondary | ICD-10-CM

## 2021-04-18 DIAGNOSIS — Z79899 Other long term (current) drug therapy: Secondary | ICD-10-CM | POA: Diagnosis not present

## 2021-04-18 DIAGNOSIS — I10 Essential (primary) hypertension: Secondary | ICD-10-CM | POA: Insufficient documentation

## 2021-04-18 DIAGNOSIS — I252 Old myocardial infarction: Secondary | ICD-10-CM | POA: Insufficient documentation

## 2021-04-18 DIAGNOSIS — Z8673 Personal history of transient ischemic attack (TIA), and cerebral infarction without residual deficits: Secondary | ICD-10-CM | POA: Diagnosis not present

## 2021-04-18 DIAGNOSIS — Z85038 Personal history of other malignant neoplasm of large intestine: Secondary | ICD-10-CM | POA: Insufficient documentation

## 2021-04-18 DIAGNOSIS — Z95828 Presence of other vascular implants and grafts: Secondary | ICD-10-CM

## 2021-04-18 LAB — CEA (ACCESS): CEA (CHCC): 2.45 ng/mL (ref 0.00–5.00)

## 2021-04-18 MED ORDER — SODIUM CHLORIDE 0.9% FLUSH
10.0000 mL | Freq: Once | INTRAVENOUS | Status: AC
  Administered 2021-04-18: 10 mL

## 2021-04-18 MED ORDER — HEPARIN SOD (PORK) LOCK FLUSH 100 UNIT/ML IV SOLN
500.0000 [IU] | Freq: Once | INTRAVENOUS | Status: AC
  Administered 2021-04-18: 500 [IU]

## 2021-04-18 NOTE — Progress Notes (Signed)
[name] OFFICE PROGRESS NOTE   Diagnosis: Colon cancer  INTERVAL HISTORY:   Ms. [name] returns as scheduled.  She complains of right knee pain.  She has been evaluated by orthopedics.  She saw Dr. Harlow Asa for evaluation of the thyroid nodule.  He recommended a 1 year follow-up ultrasound.  Afirma testing was benign.  The Port-A-Cath remains in place.  Objective:  Vital signs in last 24 hours:  Blood pressure (!) 145/80, pulse 91, temperature 98.7 F (37.1 C), temperature source Oral, resp. rate 18, height 5' 2"  (1.575 m), weight 195 lb (88.5 kg), SpO2 100 %.    Lymphatics: No cervical, supraclavicular, axillary, or inguinal nodes Resp: Lungs clear bilaterally Cardio: Regular rate and rhythm GI: No hepatosplenomegaly, no mass, nontender Vascular: No leg edema   Portacath/PICC-without erythema  Lab Results:  Lab Results  Component Value Date   WBC 9.3 10/14/2020   HGB 13.2 10/14/2020   HCT 39.9 10/14/2020   MCV 91.7 10/14/2020   PLT 302 10/14/2020   NEUTROABS 6.4 10/14/2020    CMP  Lab Results  Component Value Date   NA 138 10/14/2020   K 3.7 10/14/2020   CL 101 10/14/2020   CO2 26 10/14/2020   GLUCOSE 109 (H) 10/14/2020   BUN 17 10/14/2020   CREATININE 0.91 10/14/2020   CALCIUM 10.0 10/14/2020   PROT 5.9 (L) 07/06/2019   ALBUMIN 2.9 (L) 07/06/2019   AST 19 07/06/2019   ALT 13 07/06/2019   ALKPHOS 66 07/06/2019   BILITOT 1.0 07/06/2019   GFRNONAA >60 10/14/2020   GFRAA 58 (L) 12/04/2019    Lab Results  Component Value Date   CEA1 2.54 10/14/2020   CEA 2.23 10/14/2020     Medications: I have reviewed the patient's current medications.   Assessment/Plan:  Colon cancer (T3, N1b) Colonoscopy 11/24/2018-large 5 cm malignant appearing fungating mass encompassing the ileocecal valve was identified.  Pathology showed invasive adenocarcinoma, moderately differentiated.   CT abdomen/pelvis 12/07/2018-eccentric wall thickening in the  cecum, around the ileocecal valve, compatible with findings reported at the recent colonoscopy.  Borderline enlarged adjacent lymph nodes in the ileocolic mesentery.  No evidence for liver metastases.   CEA 01/13/2019-12.7 CEA 02/23/19 postop/at start of adjuvant chemo - 1.83 01/17/2019 right hemicolectomy by Dr. Lucia Gaskins.  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 Declined genetic testing on 02/20/19, patient indicated she may reconsider after completing adjuvant treatment. Seen by Roma Kayser CT chest 02/17/2019-tiny bilateral pulmonary nodules felt to be unlikely related to metastatic disease PAC placement per Dr. Lucia Gaskins 02/22/2019 Cycle 1 adjuvant FOLFOX 02/23/19  Cycle 2 adjuvant FOLFOX 03/09/2019 Cycle 3 adjuvant FOLFOX 03/23/2019 (oxaliplatin dose reduced due to low normal neutrophil count) Cycle 4 adjuvant FOLFOX 04/06/2019 Cycle 5 adjuvant FOLFOX 05/01/2019 Cycle 6 adjuvant FOLFOX 05/15/2019 (oxaliplatin held due to neuropathy) CT 12/04/2019-stable right thyroid nodule, no evidence of metastatic disease, stable bilateral lung nodules CTs 10/15/2020- no evidence of metastatic disease chest, abdomen, pelvis.  Occasional tiny pulmonary nodules stable.  Interval enlargement of a nodule right lobe of the thyroid.   History of iron deficiency anemia Hypertension History of MI status post stent placement History of stroke 1996 Oxaliplatin neuropathy Anorexia/weight loss Report of balance difficulty B 12 deficiency-B12 weekly x4 beginning 12/09/2019.  On oral B12. Right  thyroid nodule on CT 10/15/2020.  Referred for thyroid ultrasound.  Thyroid ultrasound 10/31/2020-3.3 cm right inferior TR 3 nodule meets criteria for biopsy,  this correlates with the chest CT finding.  Additional 1.5 cm right superior T3 nodule meets criteria for follow-up in 1 year.  FNA biopsy of right inferior thyroid lesion 07/15/348-KXF[facility] follicular epithelium present.  Nondiagnostic material.     Disposition: [name] is in clinical remission from colon cancer.  We will follow-up on the CEA from today.  She will return for an office visit and restaging CTs in 6 months.  I encouraged her to obtain a surveillance colonoscopy.  She would like to keep the Port-A-Cath in place.  She will return for Port-A-Cath flush every 8 weeks.  Betsy Coder, MD  04/18/2021  12:36 PM

## 2021-04-21 ENCOUNTER — Telehealth: Payer: Self-pay

## 2021-04-21 NOTE — Telephone Encounter (Signed)
Pt verbalized understanding. No further problems or concerns noted. 

## 2021-04-21 NOTE — Telephone Encounter (Signed)
-----   Message from Cassie Pier, MD sent at 04/18/2021  4:11 PM EST ----- Please call patient with the CEA is normal, follow-up as scheduled

## 2021-06-09 ENCOUNTER — Encounter (HOSPITAL_BASED_OUTPATIENT_CLINIC_OR_DEPARTMENT_OTHER): Payer: Self-pay | Admitting: Radiology

## 2021-06-09 ENCOUNTER — Other Ambulatory Visit: Payer: Self-pay

## 2021-06-09 ENCOUNTER — Inpatient Hospital Stay (HOSPITAL_BASED_OUTPATIENT_CLINIC_OR_DEPARTMENT_OTHER)
Admission: EM | Admit: 2021-06-09 | Discharge: 2021-06-12 | DRG: 247 | Disposition: A | Discharge status: Home / Self Care | Payer: Medicare Other | Source: Other Acute Inpatient Hospital | Attending: Cardiology | Admitting: Cardiology

## 2021-06-09 ENCOUNTER — Emergency Department (HOSPITAL_BASED_OUTPATIENT_CLINIC_OR_DEPARTMENT_OTHER): Payer: Medicare Other

## 2021-06-09 DIAGNOSIS — E876 Hypokalemia: Secondary | ICD-10-CM | POA: Diagnosis not present

## 2021-06-09 DIAGNOSIS — E785 Hyperlipidemia, unspecified: Secondary | ICD-10-CM | POA: Diagnosis present

## 2021-06-09 DIAGNOSIS — Z85038 Personal history of other malignant neoplasm of large intestine: Secondary | ICD-10-CM

## 2021-06-09 DIAGNOSIS — R079 Chest pain, unspecified: Secondary | ICD-10-CM | POA: Diagnosis not present

## 2021-06-09 DIAGNOSIS — Z90710 Acquired absence of both cervix and uterus: Secondary | ICD-10-CM | POA: Diagnosis not present

## 2021-06-09 DIAGNOSIS — Z888 Allergy status to other drugs, medicaments and biological substances status: Secondary | ICD-10-CM | POA: Diagnosis not present

## 2021-06-09 DIAGNOSIS — E042 Nontoxic multinodular goiter: Secondary | ICD-10-CM | POA: Diagnosis present

## 2021-06-09 DIAGNOSIS — I10 Essential (primary) hypertension: Secondary | ICD-10-CM | POA: Diagnosis present

## 2021-06-09 DIAGNOSIS — E782 Mixed hyperlipidemia: Secondary | ICD-10-CM

## 2021-06-09 DIAGNOSIS — Z79899 Other long term (current) drug therapy: Secondary | ICD-10-CM

## 2021-06-09 DIAGNOSIS — Z7982 Long term (current) use of aspirin: Secondary | ICD-10-CM

## 2021-06-09 DIAGNOSIS — I252 Old myocardial infarction: Secondary | ICD-10-CM

## 2021-06-09 DIAGNOSIS — I214 Non-ST elevation (NSTEMI) myocardial infarction: Principal | ICD-10-CM | POA: Diagnosis present

## 2021-06-09 DIAGNOSIS — I200 Unstable angina: Principal | ICD-10-CM | POA: Diagnosis present

## 2021-06-09 DIAGNOSIS — Z955 Presence of coronary angioplasty implant and graft: Secondary | ICD-10-CM

## 2021-06-09 DIAGNOSIS — E669 Obesity, unspecified: Secondary | ICD-10-CM | POA: Diagnosis present

## 2021-06-09 DIAGNOSIS — Z9049 Acquired absence of other specified parts of digestive tract: Secondary | ICD-10-CM | POA: Diagnosis not present

## 2021-06-09 DIAGNOSIS — K219 Gastro-esophageal reflux disease without esophagitis: Secondary | ICD-10-CM | POA: Diagnosis present

## 2021-06-09 DIAGNOSIS — Z6835 Body mass index (BMI) 35.0-35.9, adult: Secondary | ICD-10-CM | POA: Diagnosis not present

## 2021-06-09 DIAGNOSIS — I2510 Atherosclerotic heart disease of native coronary artery without angina pectoris: Secondary | ICD-10-CM

## 2021-06-09 DIAGNOSIS — Z20822 Contact with and (suspected) exposure to covid-19: Secondary | ICD-10-CM | POA: Diagnosis present

## 2021-06-09 DIAGNOSIS — Z8673 Personal history of transient ischemic attack (TIA), and cerebral infarction without residual deficits: Secondary | ICD-10-CM

## 2021-06-09 LAB — RESP PANEL BY RT-PCR (FLU A&B, COVID) ARPGX2
Influenza A by PCR: NEGATIVE
Influenza B by PCR: NEGATIVE
SARS Coronavirus 2 by RT PCR: NEGATIVE

## 2021-06-09 LAB — CBC
HCT: 41.4 % (ref 36.0–46.0)
Hemoglobin: 13.5 g/dL (ref 12.0–15.0)
MCH: 29.8 pg (ref 26.0–34.0)
MCHC: 32.6 g/dL (ref 30.0–36.0)
MCV: 91.4 fL (ref 80.0–100.0)
Platelets: 260 10*3/uL (ref 150–400)
RBC: 4.53 MIL/uL (ref 3.87–5.11)
RDW: 13.8 % (ref 11.5–15.5)
WBC: 11.9 10*3/uL — ABNORMAL HIGH (ref 4.0–10.5)
nRBC: 0 % (ref 0.0–0.2)

## 2021-06-09 LAB — BASIC METABOLIC PANEL
Anion gap: 10 (ref 5–15)
BUN: 14 mg/dL (ref 8–23)
CO2: 26 mmol/L (ref 22–32)
Calcium: 10 mg/dL (ref 8.9–10.3)
Chloride: 102 mmol/L (ref 98–111)
Creatinine, Ser: 0.93 mg/dL (ref 0.44–1.00)
GFR, Estimated: >60 mL/min (ref >60)
Glucose, Bld: 137 mg/dL — ABNORMAL HIGH (ref 70–99)
Potassium: 3.7 mmol/L (ref 3.5–5.1)
Sodium: 138 mmol/L (ref 135–145)

## 2021-06-09 LAB — HEPARIN LEVEL (UNFRACTIONATED)
Heparin Unfractionated: 0.14 IU/mL — ABNORMAL LOW (ref 0.30–0.70)
Heparin Unfractionated: 0.45 IU/mL (ref 0.30–0.70)

## 2021-06-09 LAB — HEMOGLOBIN A1C
Hgb A1c MFr Bld: 5.5 % (ref 4.8–5.6)
Mean Plasma Glucose: 111.15 mg/dL

## 2021-06-09 LAB — TROPONIN I (HIGH SENSITIVITY)
Troponin I (High Sensitivity): 52 ng/L — ABNORMAL HIGH (ref <18)
Troponin I (High Sensitivity): 68 ng/L — ABNORMAL HIGH (ref <18)

## 2021-06-09 LAB — TSH: TSH: 3.281 u[IU]/mL (ref 0.350–4.500)

## 2021-06-09 MED ORDER — METOPROLOL SUCCINATE ER 25 MG PO TB24
12.5000 mg | ORAL_TABLET | Freq: Every day | ORAL | Status: DC
  Administered 2021-06-09 – 2021-06-12 (×4): 12.5 mg via ORAL
  Filled 2021-06-09 (×5): qty 1

## 2021-06-09 MED ORDER — VITAMIN B-12 1000 MCG PO TABS
1000.0000 ug | ORAL_TABLET | Freq: Every day | ORAL | Status: DC
  Administered 2021-06-09 – 2021-06-12 (×3): 1000 ug via ORAL
  Filled 2021-06-09 (×3): qty 1

## 2021-06-09 MED ORDER — SODIUM CHLORIDE 0.9 % IV SOLN: 250.0000 mL | INTRAVENOUS | Status: DC | PRN

## 2021-06-09 MED ORDER — SODIUM CHLORIDE 0.9% FLUSH: 3.0000 mL | INTRAVENOUS | Status: DC | PRN

## 2021-06-09 MED ORDER — NITROGLYCERIN 0.4 MG SL SUBL: 0.4000 mg | SUBLINGUAL_TABLET | SUBLINGUAL | Status: DC | PRN

## 2021-06-09 MED ORDER — ONDANSETRON HCL 4 MG/2ML IJ SOLN: 4.0000 mg | Freq: Four times a day (QID) | INTRAMUSCULAR | Status: DC | PRN

## 2021-06-09 MED ORDER — HEPARIN (PORCINE) 25000 UT/250ML-% IV SOLN
1000.0000 [IU]/h | INTRAVENOUS | Status: DC
  Administered 2021-06-09: 850 [IU]/h via INTRAVENOUS
  Administered 2021-06-09: 1000 [IU]/h via INTRAVENOUS
  Filled 2021-06-09 (×2): qty 250

## 2021-06-09 MED ORDER — AMLODIPINE BESYLATE 5 MG PO TABS
5.0000 mg | ORAL_TABLET | Freq: Every day | ORAL | Status: DC
  Administered 2021-06-09: 5 mg via ORAL
  Filled 2021-06-09: qty 1

## 2021-06-09 MED ORDER — SODIUM CHLORIDE 0.9 % WEIGHT BASED INFUSION
3.0000 mL/kg/h | INTRAVENOUS | Status: DC
  Administered 2021-06-10: 3 mL/kg/h via INTRAVENOUS

## 2021-06-09 MED ORDER — HEPARIN BOLUS VIA INFUSION
4000.0000 [IU] | Freq: Once | INTRAVENOUS | Status: AC
  Administered 2021-06-09: 4000 [IU] via INTRAVENOUS

## 2021-06-09 MED ORDER — ONDANSETRON HCL 4 MG/2ML IJ SOLN
4.0000 mg | Freq: Four times a day (QID) | INTRAMUSCULAR | Status: DC | PRN
  Administered 2021-06-09 – 2021-06-11 (×3): 4 mg via INTRAVENOUS
  Filled 2021-06-09 (×3): qty 2

## 2021-06-09 MED ORDER — ASPIRIN 81 MG PO CHEW
81.0000 mg | CHEWABLE_TABLET | ORAL | Status: AC
  Administered 2021-06-10: 81 mg via ORAL
  Filled 2021-06-09: qty 1

## 2021-06-09 MED ORDER — ACETAMINOPHEN 325 MG PO TABS: 650.0000 mg | ORAL_TABLET | ORAL | Status: DC | PRN

## 2021-06-09 MED ORDER — ASPIRIN 81 MG PO CHEW
81.0000 mg | CHEWABLE_TABLET | Freq: Every day | ORAL | Status: DC
  Administered 2021-06-09 – 2021-06-12 (×4): 81 mg via ORAL
  Filled 2021-06-09 (×5): qty 1

## 2021-06-09 MED ORDER — EZETIMIBE 10 MG PO TABS
10.0000 mg | ORAL_TABLET | Freq: Every day | ORAL | Status: DC
  Administered 2021-06-09 – 2021-06-12 (×3): 10 mg via ORAL
  Filled 2021-06-09 (×4): qty 1

## 2021-06-09 MED ORDER — CHLORHEXIDINE GLUCONATE CLOTH 2 % EX PADS
6.0000 | MEDICATED_PAD | Freq: Every day | CUTANEOUS | Status: DC
  Administered 2021-06-11: 6 via TOPICAL

## 2021-06-09 MED ORDER — ONDANSETRON HCL 4 MG/2ML IJ SOLN
4.0000 mg | Freq: Once | INTRAMUSCULAR | Status: AC
  Administered 2021-06-09: 4 mg via INTRAVENOUS
  Filled 2021-06-09: qty 2

## 2021-06-09 MED ORDER — PANTOPRAZOLE SODIUM 40 MG PO TBEC
40.0000 mg | DELAYED_RELEASE_TABLET | Freq: Every day | ORAL | Status: DC
  Administered 2021-06-09 – 2021-06-12 (×3): 40 mg via ORAL
  Filled 2021-06-09 (×4): qty 1

## 2021-06-09 MED ORDER — ALPRAZOLAM 0.5 MG PO TABS: 0.2500 mg | ORAL_TABLET | Freq: Two times a day (BID) | ORAL | Status: DC | PRN

## 2021-06-09 MED ORDER — SODIUM CHLORIDE 0.9% FLUSH
3.0000 mL | Freq: Two times a day (BID) | INTRAVENOUS | Status: DC
  Administered 2021-06-09 – 2021-06-11 (×4): 3 mL via INTRAVENOUS

## 2021-06-09 MED ORDER — HEPARIN BOLUS VIA INFUSION
2000.0000 [IU] | Freq: Once | INTRAVENOUS | Status: AC
  Administered 2021-06-09: 2000 [IU] via INTRAVENOUS
  Filled 2021-06-09: qty 2000

## 2021-06-09 MED ORDER — ASPIRIN 81 MG PO CHEW
324.0000 mg | CHEWABLE_TABLET | Freq: Once | ORAL | Status: AC
  Administered 2021-06-09: 324 mg via ORAL
  Filled 2021-06-09: qty 4

## 2021-06-09 MED ORDER — ZOLPIDEM TARTRATE 5 MG PO TABS
5.0000 mg | ORAL_TABLET | Freq: Every evening | ORAL | Status: DC | PRN
  Administered 2021-06-11: 5 mg via ORAL
  Filled 2021-06-09: qty 1

## 2021-06-09 MED ORDER — SODIUM CHLORIDE 0.9% FLUSH
3.0000 mL | Freq: Two times a day (BID) | INTRAVENOUS | Status: DC
  Administered 2021-06-09: 3 mL via INTRAVENOUS

## 2021-06-09 MED ORDER — IRBESARTAN 300 MG PO TABS
150.0000 mg | ORAL_TABLET | Freq: Every day | ORAL | Status: DC
  Administered 2021-06-09: 150 mg via ORAL
  Filled 2021-06-09: qty 1

## 2021-06-09 MED ORDER — SODIUM CHLORIDE 0.9 % WEIGHT BASED INFUSION
1.0000 mL/kg/h | INTRAVENOUS | Status: DC
  Administered 2021-06-10 (×2): 1 mL/kg/h via INTRAVENOUS

## 2021-06-09 MED ORDER — FERROUS SULFATE 325 (65 FE) MG PO TABS
325.0000 mg | ORAL_TABLET | Freq: Every day | ORAL | Status: DC
  Administered 2021-06-12: 325 mg via ORAL
  Filled 2021-06-09: qty 1

## 2021-06-09 NOTE — Plan of Care (Signed)

## 2021-06-09 NOTE — ED Triage Notes (Signed)
Pt states she has had chest pain on and off for the past few days but the pain has went away each time. Today the pain didn't go away and she became concerned. Pt has a prior history of MI with stent placement.

## 2021-06-09 NOTE — H&P (Addendum)
CardiologyHistory and Physical:   Patient ID: [name] MRN: [id number]; DOB: 1946/03/11  Admit date: 06/09/2021 Date of Consult: 06/09/2021  PCP:  Cassie Noon, MD   [facility] Providers Cardiologist:  Cassie Martinique, MD        Patient Profile:   [name] is a 76 y.o. female with a hx of NSTEMI 2017 w/ DES dRCA, HTN, HLD, obesity, colon CA s/p R  hemicolectomy, CVA, GERD, who is being seen 06/09/2021 for the evaluation of chest pain.  History of Present Illness:   [name] was not having chest pain when she last saw Dr. Martinique in 2021.  In the last couple of years, she has had significant mobility issues and at one point was unable to walk.  She has had physical therapy and now walks with the aid of a walker.  However, her activity level is very low.  Her husband and daughter help in her care.  She has not been having chest pain with exertion.  A few days after Christmas, she had an episode of chest pain that was substernal, she describes it as discomfort but not severe.  Up to a 6/10.  She did not take anything for it.  It resolved after about an hour.  She is not sure about other associated symptoms.  Yesterday, she had another episode of that same chest pain.  It lasted longer and just would not resolve.  It was associated with nausea, but she denies shortness of breath or diaphoresis.  The pain was not positional and did not change with deep inspiration.  It started at rest.  She did not take any medications for it.  Her left arm also had a heavy feeling and was a little numb.  When it did not resolve, she went to the [facility] ER.  She was initially given aspirin and started on heparin.  She does not remember getting anything specifically for the pain, but it did resolve.  She thinks the nausea resolved as well.  Cardiology was consulted by phone and she was transferred to [facility].  In route, she became very nauseated and got Zofran.  Her  nausea resolved.  The chest pain has not returned.  The chest pain she had at the time of her MI in 2017 was different from this pain.  It was a pressure, in her chest and in her back.  Until the last week, she had never had the symptoms before.  In the last couple of years, she has had significant musculoskeletal issues, saying at one point she was unable to walk.  Upon review of the records in [place], I do not find a diagnosis for this.  She has also had right knee pain, and got steroid injections for that.  She has multiple thyroid nodules, evaluated by Dr. Harlow Thomas.  Her TSH has been normal and the thyroid nodules have a 96% chance of being benign based on biopsy.  No further work-up is planned.   Past Medical History:  Diagnosis Date   Cancer of right colon ([place])    Constipation    CVA (cerebral infarction) 1996   Dyspnea    going up stairs and hills   Family history of colon cancer    GERD (gastroesophageal reflux disease)    History of cardiomegaly    Hyperlipidemia    Hypertension    Iron deficiency anemia    NSTEMI (non-ST elevated myocardial infarction) ([place]) 2017   Personal history of  colonic polyps    PONV (postoperative nausea and vomiting)    Right shoulder pain    more painful at night , cold makes it worse   Severe obesity ([place])     Past Surgical History:  Procedure Laterality Date   ABDOMINAL HYSTERECTOMY     BACK SURGERY     x2   CARDIAC CATHETERIZATION N/A 02/28/2016   Procedure: Left Heart Cath and Coronary Angiography;  Surgeon: [name] Martinique, MD;  Location: [place] [facility];  Service: Cardiovascular;  Laterality: N/A;   CARDIAC CATHETERIZATION N/A 02/28/2016   Procedure: Coronary Stent Intervention;  Surgeon: [name] Martinique, MD;  Location: [place] [facility];  Service: Cardiovascular;  Laterality: N/A;   CHOLECYSTECTOMY     COLONOSCOPY  11/24/2018   CORONARY ANGIOPLASTY     CYST EXCISION Left    Cheek   LAPAROSCOPIC RIGHT HEMI COLECTOMY  Right 01/17/2019   Procedure: LAPAROSCOPIC RIGHT HEMI COLECTOMY;  Surgeon: Cassie Overall, MD;  Location: [facility];  Service: General;  Laterality: Right;   NASAL FRACTURE SURGERY     PORTACATH PLACEMENT N/A 02/22/2019   Procedure: POWER PORT PLACEMENT;  Surgeon: Cassie Overall, MD;  Location: [place];  Service: General;  Laterality: N/A;   UPPER GI ENDOSCOPY  11/24/2018     Home Medications:  Prior to Admission medications   Medication Sig Start Date End Date Taking? Authorizing Provider  amLODipine (NORVASC) 5 MG tablet Take 5 mg by mouth daily.    [provider]  amLODipine (NORVASC) 5 MG tablet Take 1 tablet by mouth daily. 12/27/20   [provider]  ASPIRIN 81 PO Take by mouth.    [provider]  ezetimibe (ZETIA) 10 MG tablet Take 1 tablet by mouth once daily 02/12/21   Thomas, Cassie M, MD  Ferrous Sulfate (IRON PO) Take 65 mg by mouth daily.    [provider]  loperamide (IMODIUM) 2 MG capsule Take 2-4 mg by mouth 4 (four) times daily as needed for diarrhea or loose stools.    [provider]  losartan (COZAAR) 100 MG tablet Take 100 mg by mouth daily.    [provider]  nystatin (MYCOSTATIN/NYSTOP) powder Apply topically. 07/02/20 07/02/21  [provider]  omeprazole (PRILOSEC) 40 MG capsule Take 40 mg by mouth daily. 12/06/19   [provider]  valsartan (DIOVAN) 160 MG tablet Take by mouth. 12/27/20   [provider]  vitamin B-12 (CYANOCOBALAMIN) 1000 MCG tablet Take 1,000 mcg by mouth daily. 12/08/19   Cassie Pier, MD  VITAMIN D PO Take by mouth.    [provider]    Inpatient Medications: Scheduled Meds:  Continuous Infusions:  heparin 850 Units/hr (06/09/21 0345)   PRN Meds:   Allergies:    Allergies  Allergen Reactions   Guaifenesin Er Palpitations   Lotensin [Benazepril Hcl] Other (See Comments)    Took off when patient had stroke   Propoxyphene Palpitations    Social  History:   Social History   Socioeconomic History   Marital status: Married    Spouse name: Not on file   Number of children: Not on file   Years of education: Not on file   Highest education level: Not on file  Occupational History   Not on file  Tobacco Use   Smoking status: Never   Smokeless tobacco: Never  Vaping Use   Vaping Use: Never used  Substance and Sexual Activity   Alcohol use: No  Drug use: No   Sexual activity: Not on file  Other Topics Concern   Not on file  Social History Narrative   Not on file   Social Determinants of Health   Financial Resource Strain: Not on file  Food Insecurity: Not on file  Transportation Needs: Not on file  Physical Activity: Not on file  Stress: Not on file  Social Connections: Not on file  Intimate Partner Violence: Not on file    Family History:   Family History  Problem Relation Age of Onset   Esophageal cancer Sister 24   Lung cancer Brother 15   CAD Daughter    Breast cancer Maternal Aunt        dx over 4   Colon polyps Sister        total of 24 polyps   Colon cancer Sister 12   Breast cancer Cousin        dx in her 62s; mat first cousin     ROS:  Please see the history of present illness.  All other ROS reviewed and negative.     Physical Exam/Data:   Vitals:   06/09/21 0500 06/09/21 0530 06/09/21 0623 06/09/21 0626  BP: (!) 150/78 (!) 162/74  (!) 149/81  Pulse: 72 87  98  Resp: 17 (!) 24  19  Temp:  98.7 F (37.1 C)  97.9 F (36.6 C)  TempSrc:  Oral  Oral  SpO2: 100% 96%  100%  Weight:   89.6 kg   Height:   5\' 2"  (1.575 Thomas)    No intake or output data in the 24 hours ending 06/09/21 0910 Last 3 Weights 06/09/2021 06/09/2021 04/18/2021  Weight (lbs) 197 lb 8.5 oz 195 lb 195 lb  Weight (kg) 89.6 kg 88.451 kg 88.451 kg     Body mass index is 36.13 kg/Thomas.  General:  Well nourished, well developed, obese female in no acute distress HEENT: normal Neck: no JVD Vascular: No carotid bruits; Distal  pulses 2+ bilaterally Cardiac:  normal S1, S2; RRR; no murmur  Lungs:  clear to auscultation bilaterally, no wheezing, rhonchi or rales  Abd: soft, nontender, no hepatomegaly  Ext: no edema Musculoskeletal:  No deformities, BUE and BLE strength normal and equal Skin: warm and dry  Neuro:  CNs 2-12 intact, no focal abnormalities noted Psych:  Normal affect   EKG:  The EKG was personally reviewed and demonstrates:  SR, HR 79, Inferior T wave inversions, decreased amplitude precordial leads, only minor changes from 07/05/2019  Telemetry:  Telemetry was personally reviewed and demonstrates:  SR  Relevant CV Studies:  ECHO: 02/29/2016 - Left ventricle: The cavity size was normal. Systolic function was    normal. The estimated ejection fraction was in the range of 55%    to 60%. Wall motion was normal; there were no regional wall    motion abnormalities. There was an increased relative    contribution of atrial contraction to ventricular filling.    Doppler parameters are consistent with abnormal left ventricular    relaxation (grade 1 diastolic dysfunction).  - Aortic valve: There was mild regurgitation.  - Right ventricle: Systolic function was mildly reduced.   CARDIAC CATH: 02/28/2016 Prox RCA to Mid RCA lesion, 20 %stenosed. Prox LAD lesion, 25 %stenosed. Mid LAD lesion, 30 %stenosed. Prox Cx to Mid Cx lesion, 15 %stenosed. A STENT SYNERGY DES 3X12 drug eluting stent was successfully placed. Dist RCA lesion, 95 %stenosed. Post intervention, there is a 0% residual stenosis.  LV end diastolic pressure is normal.   1. 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 Normal LV EDP 3. Successful stenting of the distal RCA with a DES. Intervention    Laboratory Data:  High Sensitivity Troponin:   Recent Labs  Lab 06/09/21 0115 06/09/21 0318  TROPONINIHS 52* 68*     Chemistry Recent Labs  Lab 06/09/21 0115  NA 138  K 3.7  CL 102  CO2 26  GLUCOSE 137*  BUN 14   CREATININE 0.93  CALCIUM 10.0  GFRNONAA >60  ANIONGAP 10    No results for input(s): PROT, ALBUMIN, AST, ALT, ALKPHOS, BILITOT in the last 168 hours. Lipids  Lab Results  Component Value Date   CHOL 159 02/29/2016   HDL 37 (L) 02/29/2016   LDLCALC 109 (H) 02/29/2016   TRIG 65 02/29/2016   CHOLHDL 4.3 02/29/2016     Hematology Recent Labs  Lab 06/09/21 0115  WBC 11.9*  RBC 4.53  HGB 13.5  HCT 41.4  MCV 91.4  MCH 29.8  MCHC 32.6  RDW 13.8  PLT 260   Thyroid  Lab Results  Component Value Date   TSH 3.402 12/08/2019      BNPNo results for input(s): BNP, PROBNP in the last 168 hours.  DDimer No results for input(s): DDIMER in the last 168 hours. Lab Results  Component Value Date   HGBA1C 5.5 01/13/2019     Radiology/Studies:  [facility] Chest Port 1 View  Result Date: 06/09/2021 CLINICAL DATA:  Initial evaluation for acute intermittent chest pain. EXAM: PORTABLE CHEST 1 VIEW COMPARISON:  Radiograph from 02/22/2019. FINDINGS: Cardiomegaly, stable. Mediastinal silhouette within normal limits. Left-sided Port-A-Cath in place with tip overlying the distal SVC. Lungs normally inflated. Mild perihilar vascular congestion without overt pulmonary edema. No visible pleural effusion. No focal infiltrates. No pneumothorax. No acute osseous finding. IMPRESSION: 1. Cardiomegaly with mild perihilar vascular congestion without overt pulmonary edema. 2. No other active cardiopulmonary disease. Electronically Signed   By: Jeannine Boga Thomas.D.   On: 06/09/2021 02:19     Assessment and Plan:   Non-STEMI: -In 2017, her regular troponin I peaked at 0.55. - Her HStroponin peak is 68, that was the initial value, the second 1 was 52 - She has no history of exertional chest pain, but her exertion level is extremely low - Her symptoms resolved without intervention, but she had aspirin and heparin and has continued to remain pain-free. - I discussed options with her and she prefers cardiac  catheterization to define her anatomy. - Cardiac catheterization was discussed with the patient fully. The patient understands that risks include but are not limited to stroke (1 in 1000), death (1 in 42), kidney failure [usually temporary] (1 in 500), bleeding (1 in 200), allergic reaction [possibly serious] (1 in 200).  The patient understands and is willing to proceed.    2.  Hypertension: - Her blood pressure is a little elevated this morning, but she has not had her usual morning medications, restart these  3.  Hyperlipidemia: - February 2022, her LDL was 108 HDL 61, recheck  4.  Thyroid nodules - Check TSH, previous biopsy determined 96% chance benign   Risk Assessment/Risk Scores:     TIMI Risk Score for Unstable Angina or Non-ST Elevation MI:   The patient's TIMI risk score is 6 , which indicates a 20 % risk of all cause mortality, new or recurrent myocardial infarction or need for urgent revascularization in the next 14 days  For questions or updates, please contact [place] Please consult [email or web address] for contact info under    Signed, Rosaria Ferries, PA-C  06/09/2021 9:10 AM   Patient seen and examined and agree with Rosaria Ferries, PA-C as detailed above.  In brief, the patient is a 76 year old female with history of NSTEMI in 2017 s/p DES to dRCA, HTN, HLD, obesity, colon cancer s/p right hemicolectomy, CVA and GERD who presented to the ER for chest pain found to have mildly elevated troponin for which Cardiology is admitting.   Patient states that a couple of days after Christmas, she had an episode of substernal chest "heaviness" that radiated down her left arm that lasted about an hour. She did not seek medical attention at that time, however, her symptoms recurred yesterday and lasted longer prompting her to go to the ER.   In the [place] ER trop 68>52. ECG with NSR, TWI inferiorly and anterolaterally (more prominent than prior ECG). She was started on  heparin gtt and transferred to [facility] for further management. Currently, the patient is chest pain free. Given symptoms, known CAD, and mildly elevated troponin, will plan for [facility] tomorrow.  GEN: No acute distress.   Neck: No JVD Cardiac: RRR, no murmurs, rubs, or gallops.  Respiratory: Clear to auscultation bilaterally. GI: Soft, mildly tender to palpation MS: No edema; No deformity. Neuro:  Nonfocal  Psych: Normal affect    Plan: -Plan for [facility] tomorrow -Check TTE -Continue heparin gtt -Continue Thomas 81mg  daily -Continue irbesartan 150mg  daily -Continue amlodipine 5mg  daily -Start metoprolol 12.5mg  XL daily with holding parameters -Intolerant to both lipitor and crestor; continue zetia 10mg  daily for now and will need [id number] as out-patient if eligible (was previously unable to afford) -PT consult for gait instability; likely needs ongoing PT  Gwyndolyn Kaufman, MD

## 2021-06-09 NOTE — ED Notes (Signed)
[facility] here to pick up pt. Pt stable at time of departure

## 2021-06-09 NOTE — Progress Notes (Signed)
ANTICOAGULATION CONSULT NOTE - Follow up [name] for Heparin  Indication: chest pain/ACS  Allergies  Allergen Reactions   Guaifenesin Er Palpitations   Lotensin [Benazepril Hcl] Palpitations   Propoxyphene Palpitations    Patient Measurements: Height: 5\' 2"  (157.5 cm) Weight: 89.6 kg (197 lb 8.5 oz) IBW/kg (Calculated) : 50.1 Heparin dosing weight 70.7 kg  Vital Signs: Temp: 98 F (36.7 C) (01/02 1138) Temp Source: Oral (01/02 1138) BP: 137/77 (01/02 1138) Pulse Rate: 78 (01/02 1138)  Labs: Recent Labs    06/09/21 0115 06/09/21 0318 06/09/21 1148  HGB 13.5  --   --   HCT 41.4  --   --   PLT 260  --   --   HEPARINUNFRC  --   --  0.14*  CREATININE 0.93  --   --   TROPONINIHS 52* 68*  --      Estimated Creatinine Clearance: 54.4 mL/min (by C-G formula based on SCr of 0.93 mg/dL).   Medical History: Past Medical History:  Diagnosis Date   Cancer of right colon ([place])    Constipation    CVA (cerebral infarction) 1996   Dyspnea    going up stairs and hills   Family history of colon cancer    GERD (gastroesophageal reflux disease)    History of cardiomegaly    Hyperlipidemia    Hypertension    Iron deficiency anemia    NSTEMI (non-ST elevated myocardial infarction) ([place]) 2017   Personal history of colonic polyps    PONV (postoperative nausea and vomiting)    Right shoulder pain    more painful at night , cold makes it worse   Severe obesity ([facility])       Assessment: 76 y/o F presenting with chest pain.  PMH includes NSTEMI 2017 w/ DES dRCA, HTN, HLD, obesity, colon CA s/p R. hemicolectomy, CVA, and GERD. Mildly elevated troponin. Pharmacy consulted to start heparin.  Initial heparin level is subtherapeutic at 0.14. Verified with RN that there have been no issues with infusion. CBC wnl. No bleeding noted. Plan for [facility] 06/10/20.    Goal of Therapy:  Heparin level 0.3-0.7 units/ml Monitor platelets by anticoagulation protocol: Yes    Plan:  Give Heparin 2000 units BOLUS Increase heparin infusion to 1000 units/hr Check heparin level in 6-8 hours and daily while on heparin Continue to monitor H&H and platelets   Thank you for allowing [place] to participate in this patients care. Jens Som, PharmD 06/09/2021 1:10 PM  **Pharmacist phone directory can be found on [email or web address] listed under [place]**

## 2021-06-09 NOTE — Progress Notes (Signed)
Mobility Specialist Progress Note:   06/09/21 1117  Mobility  Activity Ambulated in hall  Level of Assistance Standby assist, set-up cues, supervision of patient - no hands on  Assistive Device Front wheel walker  Distance Ambulated (ft) 400 ft  Mobility Ambulated with assistance in hallway  Mobility Response Tolerated well  Mobility performed by Mobility specialist  Bed Position Chair  $Mobility charge 1 Mobility   During Mobility: 94 HR Post Mobility:   85 HR  Pt received in bed willing to participate in mobility. No complaints of pain and asymptomatic throughout ambulation. Pt returned to bed with call bell in reach and all needs met.    [id number] Public librarian Phone [phone] Secondary Phone [phone]

## 2021-06-09 NOTE — ED Notes (Signed)
Called [phone] for cardiology consult

## 2021-06-09 NOTE — Progress Notes (Signed)
ANTICOAGULATION CONSULT NOTE - Initial Consult  Pharmacy Consult for Heparin  Indication: chest pain/ACS  Allergies  Allergen Reactions   Guaifenesin Er Palpitations   Lotensin [Benazepril Hcl] Other (See Comments)    Took off when patient had stroke   Propoxyphene Palpitations    Patient Measurements: Height: 5\' 2"  (157.5 cm) Weight: 88.5 kg (195 lb) IBW/kg (Calculated) : 50.1  Vital Signs: Temp: 98.7 F (37.1 C) (01/02 0052) Temp Source: Oral (01/02 0052) BP: 164/81 (01/02 0350) Pulse Rate: 78 (01/02 0350)  Labs: Recent Labs    06/09/21 0115 06/09/21 0318  HGB 13.5  --   HCT 41.4  --   PLT 260  --   CREATININE 0.93  --   TROPONINIHS 52* 68*    Estimated Creatinine Clearance: 54 mL/min (by C-G formula based on SCr of 0.93 mg/dL).   Medical History: Past Medical History:  Diagnosis Date   Cancer of right colon ([place])    Constipation    Coronary artery disease    CVA (cerebral infarction) 1996   Dyspnea    going up stairs and hills   Family history of colon cancer    GERD (gastroesophageal reflux disease)    History of cardiomegaly    Hyperlipidemia    Hypertension    Iron deficiency anemia    Myocardial infarction ([place]) 2017   NSTEMI (non-ST elevated myocardial infarction) ([place])    Personal history of colonic polyps    PONV (postoperative nausea and vomiting)    Right shoulder pain    more painful at night , cold makes it worse   Severe obesity (HCC)       Assessment: 76 y/o F with chest pain. Mildly elevated troponin. Starting heparin. CBC/renal function good. PTA meds reviewed.  Goal of Therapy:  Heparin level 0.3-0.7 units/ml Monitor platelets by anticoagulation protocol: Yes   Plan:  Heparin 4000 units BOLUS Start heparin drip at 850 units/hr 1200 Heparin level Daily CBC/Heparin level Monitor for bleeding  Narda Bonds, PharmD, BCPS Clinical Pharmacist Phone: [phone]

## 2021-06-09 NOTE — Plan of Care (Signed)

## 2021-06-09 NOTE — Progress Notes (Signed)
ANTICOAGULATION CONSULT NOTE - Follow up [place] for IV Heparin  Indication: chest pain/ACS  Allergies  Allergen Reactions   Guaifenesin Er Palpitations   Lotensin [Benazepril Hcl] Palpitations   Propoxyphene Palpitations    Patient Measurements: Height: 5\' 2"  (157.5 cm) Weight: 89.6 kg (197 lb 8.5 oz) IBW/kg (Calculated) : 50.1 Heparin dosing weight 70.7 kg  Vital Signs: Temp: 98.6 F (37 C) (01/02 2021) Temp Source: Oral (01/02 2021) BP: 155/77 (01/02 2021) Pulse Rate: 78 (01/02 1138)  Labs: Recent Labs    06/09/21 0115 06/09/21 0318 06/09/21 1148 06/09/21 2050  HGB 13.5  --   --   --   HCT 41.4  --   --   --   PLT 260  --   --   --   HEPARINUNFRC  --   --  0.14* 0.45  CREATININE 0.93  --   --   --   TROPONINIHS 52* 68*  --   --     Estimated Creatinine Clearance: 54.4 mL/min (by C-G formula based on SCr of 0.93 mg/dL).  Medical History: Past Medical History:  Diagnosis Date   Cancer of right colon ([place])    Constipation    CVA (cerebral infarction) 1996   Dyspnea    going up stairs and hills   Family history of colon cancer    GERD (gastroesophageal reflux disease)    History of cardiomegaly    Hyperlipidemia    Hypertension    Iron deficiency anemia    NSTEMI (non-ST elevated myocardial infarction) ([place]) 2017   Personal history of colonic polyps    PONV (postoperative nausea and vomiting)    Right shoulder pain    more painful at night , cold makes it worse   Severe obesity [facility])     Assessment: 76 yr old female presented with chest pain/NSTEMI. PMH includes NSTEMI in 2017 w/ DES, HTN, HLD, obesity, colon CA (S/P R hemicolectomy), CVA, and GERD. Pt has mildly elevated troponin (52>68). Pharmacy was consulted to start IV heparin. Planning for [facility] on 06/10/21.  Heparin level ~6.5 hrs after heparin 2000 units IV bolus X 1, followed by increasing heparin infusion to 1000 units/hr, was 0.45 units/mL, which is within the goal range for  this pt. H/H 13.5/41.4, plt 260. Per RN, no issues with IV or bleeding observed.  Goal of Therapy:  Heparin level 0.3-0.7 units/ml Monitor platelets by anticoagulation protocol: Yes   Plan:  Continue heparin infusion at 1000 units/hr Check confirmatory heparin level in 7-8 hrs Monitor daily heparin level, CBC Monitor for bleeding F/U after cardiac cath tomorrow, 06/10/21  Thank you for allowing [place] to participate in this patients care.  Gillermina Hu, PharmD, BCPS, [facility] Clinical Pharmacist 06/09/2021 9:25 PM

## 2021-06-09 NOTE — Evaluation (Signed)
Physical Therapy Evaluation Patient Details Name: [name] MRN: [id number] DOB: 1945-08-30 Today's Date: 06/09/2021  History of Present Illness  pt is a 76 y/o female admitted 06/09/21 for evaluation of chest pain.  Work up found NSTEMI.  Heart Cath planned for 1/3.  PMHx, Colon CA, CVA, HTN, NSTEMI, Cardiac CATH and STENTS.  Clinical Impression  Pt admitted with/for USA/CP.  Pt likely not far off from her baseline function, needing min guard at times during gait.  Pt currently limited functionally due to the problems listed below.  (see problems list.)  Pt will benefit from PT to maximize function and safety to be able to get home safely with available assist.        Recommendations for follow up therapy are one component of a multi-disciplinary discharge planning process, led by the attending physician.  Recommendations may be updated based on patient status, additional functional criteria and insurance authorization.  Follow Up Recommendations No PT follow up    Assistance Recommended at Discharge Set up Supervision/Assistance  Functional Status Assessment Patient has had a recent decline in their functional status and demonstrates the ability to make significant improvements in function in a reasonable and predictable amount of time.  Equipment Recommendations  None recommended by PT    Recommendations for Other Services       Precautions / Restrictions Precautions Precautions: Fall      Mobility  Bed Mobility Overal bed mobility: Needs Assistance Bed Mobility: Supine to Sit;Sit to Supine     Supine to sit: Supervision Sit to supine: Supervision   General bed mobility comments: needing extra time, but no assist    Transfers Overall transfer level: Needs assistance Equipment used: Rolling walker (2 wheels);None Transfers: Sit to/from Stand Sit to Stand: Supervision           General transfer comment: appropriate use of UE's     Ambulation/Gait Ambulation/Gait assistance: Min guard Gait Distance (Feet): 400 Feet Assistive device: Rolling walker (2 wheels) Gait Pattern/deviations: Step-through pattern;Drifts right/left   Gait velocity interpretation: 1.31 - 2.62 ft/sec, indicative of limited community ambulator   General Gait Details: mildly unsteady, but safe with the RW.  Flexed posture, minimal use of the RW, drift L.  Notably SOB toward end of gait trial.  Stairs            Wheelchair Mobility    Modified Rankin (Stroke Patients Only)       Balance Overall balance assessment: Mild deficits observed, not formally tested                                           Pertinent Vitals/Pain      Home Living Family/patient expects to be discharged to:: Private residence Living Arrangements: Spouse/significant other;Children Available Help at Discharge: Family;Available 24 hours/day Type of Home: House Home Access: Stairs to enter Entrance Stairs-Rails: Psychiatric nurse of Steps: 1-2   Home Layout: One level Home Equipment: Shower seat      Prior Function Prior Level of Function : Needs assist       Physical Assist : Mobility (physical) Mobility (physical): Gait   Mobility Comments: "cruises" stationary surfaces in the home and assisted into the car, Uses rollator to go to appointments.       Hand Dominance        Extremity/Trunk Assessment   Upper Extremity Assessment Upper Extremity Assessment:  Overall WFL for tasks assessed    Lower Extremity Assessment Lower Extremity Assessment: Overall WFL for tasks assessed (mild general weakness)    Cervical / Trunk Assessment Cervical / Trunk Assessment: Kyphotic  Communication   Communication: No difficulties  Cognition Arousal/Alertness: Awake/alert Behavior During Therapy: WFL for tasks assessed/performed Overall Cognitive Status: Within Functional Limits for tasks assessed                                           General Comments General comments (skin integrity, edema, etc.): vss, sats mid 90's RA and HR 80's, no chest pain, mild SOB    Exercises     Assessment/Plan    PT Assessment Patient needs continued PT services  PT Problem List Decreased strength;Decreased activity tolerance;Decreased balance;Decreased mobility;Decreased knowledge of use of DME;Cardiopulmonary status limiting activity       PT Treatment Interventions Gait training;Stair training;Functional mobility training;Therapeutic activities;Patient/family education;Balance training    PT Goals (Current goals can be found in the Care Plan section)  Acute Rehab PT Goals Patient Stated Goal: figure out what's up with my heart. PT Goal Formulation: With patient Time For Goal Achievement: 06/23/21 Potential to Achieve Goals: Good    Frequency Min 3X/week   Barriers to discharge        Co-evaluation               AM-PAC PT "6 Clicks" Mobility  Outcome Measure Help needed turning from your back to your side while in a flat bed without using bedrails?: None Help needed moving from lying on your back to sitting on the side of a flat bed without using bedrails?: None Help needed moving to and from a bed to a chair (including a wheelchair)?: A Little Help needed standing up from a chair using your arms (e.g., wheelchair or bedside chair)?: A Little Help needed to walk in hospital room?: A Little Help needed climbing 3-5 steps with a railing? : A Little 6 Click Score: 20    End of Session   Activity Tolerance: Patient tolerated treatment well Patient left: in bed;with call bell/phone within reach;with nursing/sitter in room;with family/visitor present Nurse Communication: Mobility status PT Visit Diagnosis: Other abnormalities of gait and mobility (R26.89)    Time: 8413-2440 PT Time Calculation (min) (ACUTE ONLY): 24 min   Charges:   PT Evaluation $PT Eval Moderate  Complexity: 1 Mod PT Treatments $Gait Training: 8-22 mins        06/09/2021  Ginger Carne., PT Acute Rehabilitation Services [phone]  (pager) [phone]  (office)  Tessie Fass Levis Nazir 06/09/2021, 4:26 PM

## 2021-06-09 NOTE — TOC Progression Note (Signed)
Transition of Care [facility]) - Progression Note    Patient Details  Name: [name] MRN: [id number] Date of Birth: 29-May-1946  Transition of Care [facility]) CM/SW Contact  Zenon Mayo, RN Phone Number: 06/09/2021, 3:15 PM  Clinical Narrative:     Transition of Care [facility]) Screening Note   Patient Details  Name: [name] Date of Birth: 1946/05/04   Transition of Care [facility]) CM/SW Contact:    Zenon Mayo, RN Phone Number: 06/09/2021, 3:15 PM    Transition of Care Department [facility]) has reviewed patient and no TOC needs have been identified at this time. We will continue to monitor patient advancement through interdisciplinary progression rounds. If new patient transition needs arise, please place a TOC consult.          Expected Discharge Plan and Services                                                 Social Determinants of Health (SDOH) Interventions    Readmission Risk Interventions No flowsheet data found.

## 2021-06-09 NOTE — ED Notes (Signed)
Pt helped to [facility]

## 2021-06-09 NOTE — ED Provider Notes (Addendum)
[facility] EMERGENCY DEPT Provider Note   CSN: [id number] Arrival date & time: 06/09/21  0037     History  Chief Complaint  Patient presents with   Chest Pain    [name] is a 76 y.o. female.  Patient is a 76 year old female with past medical history of coronary artery disease with stent in the RCA in 2017.  She also has history of colon cancer, GERD, hypertension, and hyperlipidemia.  Patient presenting today for evaluation of chest discomfort.  This started at approximately 11:00 this evening as she was lying in bed.  She describes a pressure to the center of her chest with with some nausea, but no shortness of breath, diaphoresis, or radiation.  This lasted for approximately 2 hours, then resolved shortly after arriving to the ER.  She also reports she has had similar episodes for the past few days, but this 1 lasted longer and was more concerning.  She tells me her prior cardiac symptoms involve discomfort in her back and a feeling of indigestion and were somewhat different from what she is experiencing now.  The history is provided by the patient.  Chest Pain Pain location:  Substernal area Pain quality: pressure   Pain radiates to:  Does not radiate Pain severity:  Moderate Onset quality:  Sudden Duration:  2 hours Timing:  Constant Progression:  Resolved Chronicity:  New Relieved by:  Nothing Worsened by:  Nothing Ineffective treatments:  None tried     Home Medications Prior to Admission medications   Medication Sig Start Date End Date Taking? Authorizing Provider  amLODipine (NORVASC) 5 MG tablet Take 5 mg by mouth daily.    [provider]  amLODipine (NORVASC) 5 MG tablet Take 1 tablet by mouth daily. 12/27/20   [provider]  ASPIRIN 81 PO Take by mouth.    [provider]  ezetimibe (ZETIA) 10 MG tablet Take 1 tablet by mouth once daily 02/12/21   Martinique, Peter M, MD  Ferrous Sulfate (IRON PO) Take 65 mg  by mouth daily.    [provider]  loperamide (IMODIUM) 2 MG capsule Take 2-4 mg by mouth 4 (four) times daily as needed for diarrhea or loose stools.    [provider]  losartan (COZAAR) 100 MG tablet Take 100 mg by mouth daily.    [provider]  nystatin (MYCOSTATIN/NYSTOP) powder Apply topically. 07/02/20 07/02/21  [provider]  omeprazole (PRILOSEC) 40 MG capsule Take 40 mg by mouth daily. 12/06/19   [provider]  valsartan (DIOVAN) 160 MG tablet Take by mouth. 12/27/20   [provider]  vitamin B-12 (CYANOCOBALAMIN) 1000 MCG tablet Take 1,000 mcg by mouth daily. 12/08/19   Ladell Pier, MD  VITAMIN D PO Take by mouth.    [provider]      Allergies    Guaifenesin er, Lotensin [benazepril hcl], and Propoxyphene    Review of Systems   Review of Systems  Cardiovascular:  Positive for chest pain.  All other systems reviewed and are negative.  Physical Exam Updated Vital Signs BP (!) 149/77    Pulse 67    Temp 98.7 F (37.1 C) (Oral)    Resp 18    Ht 5\' 2"  (1.575 m)    Wt 88.5 kg    SpO2 98%    BMI 35.67 kg/m  Physical Exam Vitals and nursing note reviewed.  Constitutional:      General: She is not in acute distress.  Appearance: She is well-developed. She is not diaphoretic.  HENT:     Head: Normocephalic and atraumatic.  Cardiovascular:     Rate and Rhythm: Normal rate and regular rhythm.     Heart sounds: No murmur heard.   No friction rub. No gallop.  Pulmonary:     Effort: Pulmonary effort is normal. No respiratory distress.     Breath sounds: Normal breath sounds. No wheezing.  Abdominal:     General: Bowel sounds are normal. There is no distension.     Palpations: Abdomen is soft.     Tenderness: There is no abdominal tenderness.  Musculoskeletal:        General: Normal range of motion.     Cervical back: Normal range of motion and neck supple.     Right lower leg: No tenderness. No edema.      Left lower leg: No tenderness. No edema.  Skin:    General: Skin is warm and dry.  Neurological:     General: No focal deficit present.     Mental Status: She is alert and oriented to person, place, and time.    ED Results / Procedures / Treatments   Labs (all labs ordered are listed, but only abnormal results are displayed) Labs Reviewed  BASIC METABOLIC PANEL - Abnormal; Notable for the following components:      Result Value   Glucose, Bld 137 (*)    All other components within normal limits  CBC - Abnormal; Notable for the following components:   WBC 11.9 (*)    All other components within normal limits  TROPONIN I (HIGH SENSITIVITY) - Abnormal; Notable for the following components:   Troponin I (High Sensitivity) 52 (*)    All other components within normal limits  TROPONIN I (HIGH SENSITIVITY)    EKG EKG Interpretation  Date/Time:  Monday June 09 2021 00:47:28 EST Ventricular Rate:  79 PR Interval:  168 QRS Duration: 82 QT Interval:  378 QTC Calculation: 433 R Axis:   -34 Text Interpretation: Normal sinus rhythm Left axis deviation Possible Anterolateral infarct , age undetermined Abnormal ECG When compared with ECG of 05-Jul-2019 15:42, PREVIOUS ECG IS PRESENT Confirmed by Veryl Speak [phone]) on 06/09/2021 2:11:31 AM  Radiology DG Chest Port 1 View  Result Date: 06/09/2021 CLINICAL DATA:  Initial evaluation for acute intermittent chest pain. EXAM: PORTABLE CHEST 1 VIEW COMPARISON:  Radiograph from 02/22/2019. FINDINGS: Cardiomegaly, stable. Mediastinal silhouette within normal limits. Left-sided Port-A-Cath in place with tip overlying the distal SVC. Lungs normally inflated. Mild perihilar vascular congestion without overt pulmonary edema. No visible pleural effusion. No focal infiltrates. No pneumothorax. No acute osseous finding. IMPRESSION: 1. Cardiomegaly with mild perihilar vascular congestion without overt pulmonary edema. 2. No other active cardiopulmonary  disease. Electronically Signed   By: Jeannine Boga M.D.   On: 06/09/2021 02:19    Procedures Procedures  Continuous cardiac monitoring  Medications Ordered in ED Medications - No data to display  ED Course/ Medical Decision Making/ A&P  This patient presents to the ED for concern of chest discomfort, this involves an extensive number of treatment options, and is a complaint that carries with it a high risk of complications and morbidity.  The differential diagnosis includes acute coronary syndrome, pulmonary embolism, aortic dissection, costochondritis, esophageal reflux   Co morbidities that complicate the patient evaluation  N/A   Additional history obtained:  Additional history obtained from husband at bedside   Lab Tests:  I Ordered, and personally interpreted  labs.  The pertinent results include: Initial troponin of 53, then repeated is 68.   Imaging Studies ordered:  I ordered imaging studies including chest x-ray I independently visualized and interpreted imaging which showed no acute process I agree with the radiologist interpretation   Cardiac Monitoring:  The patient was maintained on a cardiac monitor.  I personally viewed and interpreted the cardiac monitored which showed an underlying rhythm of: Sinus rhythm   Medicines ordered and prescription drug management:  I ordered medication including aspirin 324 mg and heparin for anticoagulation for apparent acute coronary syndrome Reevaluation of the patient after these medicines showed that the patient improved I have reviewed the patients home medicines and have made adjustments as needed   Test Considered:  No other test considered or indicated   Critical Interventions:  Anticoagulation for acute coronary syndrome   Consultations Obtained:  I requested consultation with the cardiologist and discussed lab and imaging findings as well as pertinent plan - they recommend: Admission to the  cardiology service along with anticoagulation with aspirin and heparin   Problem List / ED Course:  Patient presenting here with chest discomfort as described in the HPI.  Her symptoms have waxed and waned over the past few days, but became worse this evening.  Her symptoms had resolved shortly after arriving to the emergency department.  Her EKG is unchanged, but initial troponin is 53, then repeated at 68. The above findings were discussed with the cardiology fellow on-call who is recommending admission.  Patient will be transferred to [facility] for further evaluation.  She is currently pain-free.   Reevaluation:  After the interventions noted above, I reevaluated the patient and found that they have :resolved   Social Determinants of Health:  None   Dispostion:  After consideration of the diagnostic results and the patients response to treatment, I feel that the patent would benefit from inpatient cardiology evaluation.    CRITICAL CARE Performed by: Veryl Speak Total critical care time: 40 minutes Critical care time was exclusive of separately billable procedures and treating other patients. Critical care was necessary to treat or prevent imminent or life-threatening deterioration. Critical care was time spent personally by me on the following activities: development of treatment plan with patient and/or surrogate as well as nursing, discussions with consultants, evaluation of patient's response to treatment, examination of patient, obtaining history from patient or surrogate, ordering and performing treatments and interventions, ordering and review of laboratory studies, ordering and review of radiographic studies, pulse oximetry and re-evaluation of patient's condition.    Final Clinical Impression(s) / ED Diagnoses Final diagnoses:  Chest pain    Rx / DC Orders ED Discharge Orders     None         Veryl Speak, MD 06/09/21 [id number]    Veryl Speak,  MD 06/09/21 6754

## 2021-06-10 ENCOUNTER — Inpatient Hospital Stay (HOSPITAL_COMMUNITY)
Admission: EM | Disposition: A | Discharge status: Home / Self Care | Payer: Self-pay | Source: Other Acute Inpatient Hospital | Attending: Cardiology

## 2021-06-10 ENCOUNTER — Other Ambulatory Visit (HOSPITAL_COMMUNITY): Payer: Medicare Other

## 2021-06-10 DIAGNOSIS — I2510 Atherosclerotic heart disease of native coronary artery without angina pectoris: Secondary | ICD-10-CM

## 2021-06-10 DIAGNOSIS — I214 Non-ST elevation (NSTEMI) myocardial infarction: Secondary | ICD-10-CM | POA: Diagnosis not present

## 2021-06-10 HISTORY — PX: CORONARY STENT INTERVENTION: CATH118234

## 2021-06-10 HISTORY — PX: LEFT HEART CATH AND CORONARY ANGIOGRAPHY: CATH118249

## 2021-06-10 LAB — LIPID PANEL
Cholesterol: 150 mg/dL (ref 0–200)
HDL: 47 mg/dL (ref >40)
LDL Cholesterol: 89 mg/dL (ref 0–99)
Total CHOL/HDL Ratio: 3.2 RATIO
Triglycerides: 69 mg/dL (ref <150)
VLDL: 14 mg/dL (ref 0–40)

## 2021-06-10 LAB — CBC
HCT: 35.9 % — ABNORMAL LOW (ref 36.0–46.0)
Hemoglobin: 11.9 g/dL — ABNORMAL LOW (ref 12.0–15.0)
MCH: 30.7 pg (ref 26.0–34.0)
MCHC: 33.1 g/dL (ref 30.0–36.0)
MCV: 92.5 fL (ref 80.0–100.0)
Platelets: 246 10*3/uL (ref 150–400)
RBC: 3.88 MIL/uL (ref 3.87–5.11)
RDW: 13.7 % (ref 11.5–15.5)
WBC: 8 10*3/uL (ref 4.0–10.5)
nRBC: 0 % (ref 0.0–0.2)

## 2021-06-10 LAB — COMPREHENSIVE METABOLIC PANEL
ALT: 18 U/L (ref 0–44)
AST: 13 U/L — ABNORMAL LOW (ref 15–41)
Albumin: 3.2 g/dL — ABNORMAL LOW (ref 3.5–5.0)
Alkaline Phosphatase: 80 U/L (ref 38–126)
Anion gap: 7 (ref 5–15)
BUN: 10 mg/dL (ref 8–23)
CO2: 27 mmol/L (ref 22–32)
Calcium: 9.4 mg/dL (ref 8.9–10.3)
Chloride: 104 mmol/L (ref 98–111)
Creatinine, Ser: 1.02 mg/dL — ABNORMAL HIGH (ref 0.44–1.00)
GFR, Estimated: 57 mL/min — ABNORMAL LOW (ref >60)
Glucose, Bld: 104 mg/dL — ABNORMAL HIGH (ref 70–99)
Potassium: 3.6 mmol/L (ref 3.5–5.1)
Sodium: 138 mmol/L (ref 135–145)
Total Bilirubin: 0.6 mg/dL (ref 0.3–1.2)
Total Protein: 6.5 g/dL (ref 6.5–8.1)

## 2021-06-10 LAB — POCT ACTIVATED CLOTTING TIME
Activated Clotting Time: 161 seconds
Activated Clotting Time: 654 seconds
Activated Clotting Time: 799 seconds

## 2021-06-10 LAB — GLUCOSE, CAPILLARY
Glucose-Capillary: 91 mg/dL (ref 70–99)
Glucose-Capillary: 84 mg/dL (ref 70–99)

## 2021-06-10 LAB — HEPARIN LEVEL (UNFRACTIONATED): Heparin Unfractionated: 0.55 IU/mL (ref 0.30–0.70)

## 2021-06-10 SURGERY — LEFT HEART CATH AND CORONARY ANGIOGRAPHY
Anesthesia: LOCAL

## 2021-06-10 MED ORDER — TICAGRELOR 90 MG PO TABS
ORAL_TABLET | ORAL | Status: AC
  Filled 2021-06-10: qty 1

## 2021-06-10 MED ORDER — NITROGLYCERIN 1 MG/10 ML FOR IR/CATH LAB
INTRA_ARTERIAL | Status: AC
  Filled 2021-06-10: qty 10

## 2021-06-10 MED ORDER — IOHEXOL 350 MG/ML SOLN
INTRAVENOUS | Status: DC | PRN
  Administered 2021-06-10: 190 mL via INTRA_ARTERIAL

## 2021-06-10 MED ORDER — SODIUM CHLORIDE 0.9% FLUSH: 3.0000 mL | INTRAVENOUS | Status: DC | PRN

## 2021-06-10 MED ORDER — OXYCODONE HCL 5 MG PO TABS
5.0000 mg | ORAL_TABLET | ORAL | Status: DC | PRN
  Administered 2021-06-10: 10 mg via ORAL
  Filled 2021-06-10: qty 2

## 2021-06-10 MED ORDER — FENTANYL CITRATE (PF) 100 MCG/2ML IJ SOLN
INTRAMUSCULAR | Status: DC | PRN
  Administered 2021-06-10 (×3): 25 ug via INTRAVENOUS

## 2021-06-10 MED ORDER — ONDANSETRON HCL 4 MG/2ML IJ SOLN
INTRAMUSCULAR | Status: AC
  Filled 2021-06-10: qty 2

## 2021-06-10 MED ORDER — HEPARIN SODIUM (PORCINE) 1000 UNIT/ML IJ SOLN
INTRAMUSCULAR | Status: DC | PRN
  Administered 2021-06-10: 4500 [IU] via INTRAVENOUS
  Administered 2021-06-10: 6000 [IU] via INTRAVENOUS

## 2021-06-10 MED ORDER — LABETALOL HCL 5 MG/ML IV SOLN
10.0000 mg | INTRAVENOUS | Status: AC | PRN
  Administered 2021-06-10: 10 mg via INTRAVENOUS
  Filled 2021-06-10: qty 4

## 2021-06-10 MED ORDER — SODIUM CHLORIDE 0.9 % IV SOLN: 250.0000 mL | INTRAVENOUS | Status: DC | PRN

## 2021-06-10 MED ORDER — HYDRALAZINE HCL 20 MG/ML IJ SOLN: 10.0000 mg | INTRAMUSCULAR | Status: AC | PRN

## 2021-06-10 MED ORDER — HEPARIN (PORCINE) IN NACL 1000-0.9 UT/500ML-% IV SOLN
INTRAVENOUS | Status: AC
  Filled 2021-06-10: qty 1000

## 2021-06-10 MED ORDER — BIVALIRUDIN TRIFLUOROACETATE 250 MG IV SOLR
INTRAVENOUS | Status: AC
  Filled 2021-06-10: qty 250

## 2021-06-10 MED ORDER — IRBESARTAN 150 MG PO TABS
150.0000 mg | ORAL_TABLET | Freq: Every day | ORAL | Status: DC
  Administered 2021-06-11 – 2021-06-12 (×2): 150 mg via ORAL
  Filled 2021-06-10 (×2): qty 1

## 2021-06-10 MED ORDER — TICAGRELOR 90 MG PO TABS
90.0000 mg | ORAL_TABLET | Freq: Two times a day (BID) | ORAL | Status: DC
  Administered 2021-06-11 – 2021-06-12 (×3): 90 mg via ORAL
  Filled 2021-06-10 (×4): qty 1

## 2021-06-10 MED ORDER — ONDANSETRON HCL 4 MG/2ML IJ SOLN
INTRAMUSCULAR | Status: DC | PRN
  Administered 2021-06-10: 4 mg via INTRAVENOUS

## 2021-06-10 MED ORDER — BIVALIRUDIN BOLUS VIA INFUSION - CUPID
INTRAVENOUS | Status: DC | PRN
  Administered 2021-06-10: 66.6 mg via INTRAVENOUS

## 2021-06-10 MED ORDER — VERAPAMIL HCL 2.5 MG/ML IV SOLN
INTRAVENOUS | Status: AC
  Filled 2021-06-10: qty 2

## 2021-06-10 MED ORDER — ASPIRIN 81 MG PO CHEW: 81.0000 mg | CHEWABLE_TABLET | Freq: Every day | ORAL | Status: DC

## 2021-06-10 MED ORDER — MIDAZOLAM HCL 2 MG/2ML IJ SOLN
INTRAMUSCULAR | Status: AC
  Filled 2021-06-10: qty 2

## 2021-06-10 MED ORDER — SODIUM CHLORIDE 0.9 % IV SOLN
INTRAVENOUS | Status: DC | PRN
  Administered 2021-06-10: 1.75 mg/kg/h via INTRAVENOUS

## 2021-06-10 MED ORDER — AMLODIPINE BESYLATE 10 MG PO TABS
10.0000 mg | ORAL_TABLET | Freq: Every day | ORAL | Status: DC
  Administered 2021-06-11 – 2021-06-12 (×2): 10 mg via ORAL
  Filled 2021-06-10 (×2): qty 1

## 2021-06-10 MED ORDER — FENTANYL CITRATE (PF) 100 MCG/2ML IJ SOLN
INTRAMUSCULAR | Status: AC
  Filled 2021-06-10: qty 2

## 2021-06-10 MED ORDER — LIDOCAINE HCL (PF) 1 % IJ SOLN
INTRAMUSCULAR | Status: AC
  Filled 2021-06-10: qty 30

## 2021-06-10 MED ORDER — SODIUM CHLORIDE 0.9 % IV SOLN
INTRAVENOUS | Status: AC
  Administered 2021-06-10: 125 mL/h via INTRAVENOUS

## 2021-06-10 MED ORDER — MIDAZOLAM HCL 2 MG/2ML IJ SOLN
INTRAMUSCULAR | Status: DC | PRN
  Administered 2021-06-10 (×3): 1 mg via INTRAVENOUS

## 2021-06-10 MED ORDER — ACETAMINOPHEN 325 MG PO TABS: 650.0000 mg | ORAL_TABLET | ORAL | Status: DC | PRN

## 2021-06-10 MED ORDER — ONDANSETRON HCL 4 MG/2ML IJ SOLN: 4.0000 mg | Freq: Four times a day (QID) | INTRAMUSCULAR | Status: DC | PRN

## 2021-06-10 MED ORDER — LIDOCAINE HCL (PF) 1 % IJ SOLN
INTRAMUSCULAR | Status: DC | PRN
  Administered 2021-06-10: 2 mL
  Administered 2021-06-10: 20 mL

## 2021-06-10 MED ORDER — VERAPAMIL HCL 2.5 MG/ML IV SOLN
INTRAVENOUS | Status: DC | PRN
  Administered 2021-06-10: 10 mL via INTRA_ARTERIAL

## 2021-06-10 MED ORDER — SODIUM CHLORIDE 0.9% FLUSH
3.0000 mL | Freq: Two times a day (BID) | INTRAVENOUS | Status: DC
  Administered 2021-06-11 (×2): 3 mL via INTRAVENOUS

## 2021-06-10 MED ORDER — POTASSIUM CHLORIDE CRYS ER 20 MEQ PO TBCR
40.0000 meq | EXTENDED_RELEASE_TABLET | Freq: Once | ORAL | Status: AC
  Administered 2021-06-10: 40 meq via ORAL
  Filled 2021-06-10: qty 2

## 2021-06-10 MED ORDER — HEPARIN SODIUM (PORCINE) 1000 UNIT/ML IJ SOLN
INTRAMUSCULAR | Status: AC
  Filled 2021-06-10: qty 10

## 2021-06-10 MED ORDER — TICAGRELOR 90 MG PO TABS
ORAL_TABLET | ORAL | Status: DC | PRN
  Administered 2021-06-10: 180 mg via ORAL

## 2021-06-10 SURGICAL SUPPLY — 26 items
BALLN SAPPHIRE 2.5X15 (BALLOONS) ×2
BALLN SAPPHIRE 3.0X15 (BALLOONS) ×2
BALLN SAPPHIRE ~~LOC~~ 3.75X15 (BALLOONS) ×1 IMPLANT
BALLOON SAPPHIRE 2.5X15 (BALLOONS) IMPLANT
BALLOON SAPPHIRE 3.0X15 (BALLOONS) IMPLANT
CATH 5FR JL3.5 JR4 ANG PIG MP (CATHETERS) ×1 IMPLANT
CATH LAUNCHER 5F RADR (CATHETERS) IMPLANT
CATH LAUNCHER 6FR JR4 (CATHETERS) ×1 IMPLANT
CATHETER LAUNCHER 5F RADR (CATHETERS) ×2
DEVICE RAD COMP TR BAND LRG (VASCULAR PRODUCTS) ×1 IMPLANT
GLIDESHEATH SLEND A-KIT 6F 22G (SHEATH) ×1 IMPLANT
GUIDEWIRE INQWIRE 1.5J.035X260 (WIRE) IMPLANT
INQWIRE 1.5J .035X260CM (WIRE) ×2
KIT ENCORE 26 ADVANTAGE (KITS) ×1 IMPLANT
KIT HEART LEFT (KITS) ×2 IMPLANT
PACK CARDIAC CATHETERIZATION (CUSTOM PROCEDURE TRAY) ×2 IMPLANT
SHEATH PINNACLE 5F 10CM (SHEATH) ×1 IMPLANT
SHEATH PINNACLE 6F 10CM (SHEATH) ×1 IMPLANT
SHEATH PROBE COVER 6X72 (BAG) ×1 IMPLANT
STENT SYNERGY XD 3.50X24 (Permanent Stent) IMPLANT
SYNERGY XD 3.50X24 (Permanent Stent) ×2 IMPLANT
TRANSDUCER W/STOPCOCK (MISCELLANEOUS) ×2 IMPLANT
TUBING CIL FLEX 10 FLL-RA (TUBING) ×2 IMPLANT
WIRE ASAHI PROWATER 180CM (WIRE) ×1 IMPLANT
WIRE EMERALD 3MM-J .035X150CM (WIRE) ×1 IMPLANT
WIRE HI TORQ VERSACORE-J 145CM (WIRE) ×1 IMPLANT

## 2021-06-10 NOTE — H&P (View-Only) (Signed)
Progress Note  Patient Name: [name] Date of Encounter: 06/10/2021  [facility] Cardiologist: Peter Martinique, MD   Subjective   A few episodes of mild chest discomfort, but nothing like what initially brought her in  Inpatient Medications    Scheduled Meds:  amLODipine  5 mg Oral Daily   aspirin  81 mg Oral Daily   Chlorhexidine Gluconate Cloth  6 each Topical Daily   ezetimibe  10 mg Oral Daily   ferrous sulfate  325 mg Oral Q breakfast   irbesartan  150 mg Oral Daily   metoprolol succinate  12.5 mg Oral Daily   pantoprazole  40 mg Oral Daily   sodium chloride flush  3 mL Intravenous Q12H   sodium chloride flush  3 mL Intravenous Q12H   vitamin B-12  1,000 mcg Oral Daily   Continuous Infusions:  sodium chloride     sodium chloride     sodium chloride 1 mL/kg/hr (06/10/21 0414)   heparin 1,000 Units/hr (06/09/21 2123)   PRN Meds: sodium chloride, sodium chloride, acetaminophen, ALPRAZolam, nitroGLYCERIN, ondansetron (ZOFRAN) IV, sodium chloride flush, sodium chloride flush, zolpidem   Vital Signs    Vitals:   06/09/21 0626 06/09/21 1138 06/09/21 2021 06/10/21 0450  BP: (!) 149/81 137/77 (!) 155/77 (!) 170/76  Pulse: 98 78  92  Resp: 19 17 17 18   Temp: 97.9 F (36.6 C) 98 F (36.7 C) 98.6 F (37 C) 98.4 F (36.9 C)  TempSrc: Oral Oral Oral Oral  SpO2: 100% 99% 98% 97%  Weight:    88.8 kg  Height:        Intake/Output Summary (Last 24 hours) at 06/10/2021 0913 Last data filed at 06/10/2021 0400 Gross per 24 hour  Intake 520.83 ml  Output --  Net 520.83 ml   Last 3 Weights 06/10/2021 06/09/2021 06/09/2021  Weight (lbs) 195 lb 12.8 oz 197 lb 8.5 oz 195 lb  Weight (kg) 88.814 kg 89.6 kg 88.451 kg      Telemetry    SB - Personally Reviewed  ECG    No new tracing   Physical Exam   GEN: No acute distress.   Neck: No JVD Cardiac: RRR, no murmurs, rubs, or gallops.  Respiratory: Clear to auscultation bilaterally. GI: Soft, nontender,  non-distended  MS: No edema; No deformity. Neuro:  Nonfocal  Psych: Normal affect   Labs    High Sensitivity Troponin:   Recent Labs  Lab 06/09/21 0115 06/09/21 0318  TROPONINIHS 52* 68*     Chemistry Recent Labs  Lab 06/09/21 0115 06/10/21 0439  NA 138 138  K 3.7 3.6  CL 102 104  CO2 26 27  GLUCOSE 137* 104*  BUN 14 10  CREATININE 0.93 1.02*  CALCIUM 10.0 9.4  PROT  --  6.5  ALBUMIN  --  3.2*  AST  --  13*  ALT  --  18  ALKPHOS  --  80  BILITOT  --  0.6  GFRNONAA >60 57*  ANIONGAP 10 7    Lipids  Recent Labs  Lab 06/10/21 0439  CHOL 150  TRIG 69  HDL 47  LDLCALC 89  CHOLHDL 3.2    Hematology Recent Labs  Lab 06/09/21 0115 06/10/21 0439  WBC 11.9* 8.0  RBC 4.53 3.88  HGB 13.5 11.9*  HCT 41.4 35.9*  MCV 91.4 92.5  MCH 29.8 30.7  MCHC 32.6 33.1  RDW 13.8 13.7  PLT 260 246   Thyroid  Recent Labs  Lab 06/09/21 2050  TSH 3.281    BNPNo results for input(s): BNP, PROBNP in the last 168 hours.  DDimer No results for input(s): DDIMER in the last 168 hours.   Radiology    DG Chest Port 1 View  Result Date: 06/09/2021 CLINICAL DATA:  Initial evaluation for acute intermittent chest pain. EXAM: PORTABLE CHEST 1 VIEW COMPARISON:  Radiograph from 02/22/2019. FINDINGS: Cardiomegaly, stable. Mediastinal silhouette within normal limits. Left-sided Port-A-Cath in place with tip overlying the distal SVC. Lungs normally inflated. Mild perihilar vascular congestion without overt pulmonary edema. No visible pleural effusion. No focal infiltrates. No pneumothorax. No acute osseous finding. IMPRESSION: 1. Cardiomegaly with mild perihilar vascular congestion without overt pulmonary edema. 2. No other active cardiopulmonary disease. Electronically Signed   By: Jeannine Boga M.D.   On: 06/09/2021 02:19    Cardiac Studies   N/a   Patient Profile     76 y.o. female with a hx of NSTEMI 2017 w/ DES dRCA, HTN, HLD, obesity, colon CA s/p R  hemicolectomy, CVA,  GERD, who was seen on 06/09/2021 for the evaluation of chest pain.  Assessment & Plan    NSTEMI with prior hx of CAD s/p stenting dRCA: hsTn 52>>68, EKG with TWI in inferolateral leads which were more prominent that prior tracings. Planned for cardiac cath today -- remains on IV heparin, ASA, metoprolol -- echo pending  HTN: blood pressures not well controlled -- on amlodipine 5mg  daily, will further increase to 10mg  daily -- continue Toprol 12.5mg  daily, will hold ARB with plans for cardiac cath today  HLD: LDL 89 -- hx of statin intolerance to Lipitor/Crestor -- on Zetia 10mg  daily  -- consider referral to outpatient lipid clinic at discharge   Hx of thyroid nodules: TSH WNL, prior biopsy benign   For questions or updates, please contact [facility] Please consult [email or web address] for contact info under        Signed, Reino Bellis, NP  06/10/2021, 9:13 AM     I have personally seen and examined this patient. I agree with the assessment and plan as outlined above.  Chest pain free this am. Mild troponin elevation and history of CAD with prior coronary stenting.  Planning for cardiac cath later today.  Pt is NPO  I have reviewed the risks, indications, and alternatives to cardiac catheterization, possible angioplasty, and stenting with the patient. Risks include but are not limited to bleeding, infection, vascular injury, stroke, myocardial infection, arrhythmia, kidney injury, radiation-related injury in the case of prolonged fluoroscopy use, emergency cardiac surgery, and death. The patient understands the risks of serious complication is 1-2 in 4315 with diagnostic cardiac cath and 1-2% or less with angioplasty/stenting.   Lauree Chandler 06/10/2021 10:50 AM

## 2021-06-10 NOTE — Progress Notes (Addendum)
Progress Note  Patient Name: [name] Date of Encounter: 06/10/2021  [facility] Cardiologist: Peter Martinique, MD   Subjective   A few episodes of mild chest discomfort, but nothing like what initially brought her in  Inpatient Medications    Scheduled Meds:  amLODipine  5 mg Oral Daily   aspirin  81 mg Oral Daily   Chlorhexidine Gluconate Cloth  6 each Topical Daily   ezetimibe  10 mg Oral Daily   ferrous sulfate  325 mg Oral Q breakfast   irbesartan  150 mg Oral Daily   metoprolol succinate  12.5 mg Oral Daily   pantoprazole  40 mg Oral Daily   sodium chloride flush  3 mL Intravenous Q12H   sodium chloride flush  3 mL Intravenous Q12H   vitamin B-12  1,000 mcg Oral Daily   Continuous Infusions:  sodium chloride     sodium chloride     sodium chloride 1 mL/kg/hr (06/10/21 0414)   heparin 1,000 Units/hr (06/09/21 2123)   PRN Meds: sodium chloride, sodium chloride, acetaminophen, ALPRAZolam, nitroGLYCERIN, ondansetron (ZOFRAN) IV, sodium chloride flush, sodium chloride flush, zolpidem   Vital Signs    Vitals:   06/09/21 0626 06/09/21 1138 06/09/21 2021 06/10/21 0450  BP: (!) 149/81 137/77 (!) 155/77 (!) 170/76  Pulse: 98 78  92  Resp: 19 17 17 18   Temp: 97.9 F (36.6 C) 98 F (36.7 C) 98.6 F (37 C) 98.4 F (36.9 C)  TempSrc: Oral Oral Oral Oral  SpO2: 100% 99% 98% 97%  Weight:    88.8 kg  Height:        Intake/Output Summary (Last 24 hours) at 06/10/2021 0913 Last data filed at 06/10/2021 0400 Gross per 24 hour  Intake 520.83 ml  Output --  Net 520.83 ml   Last 3 Weights 06/10/2021 06/09/2021 06/09/2021  Weight (lbs) 195 lb 12.8 oz 197 lb 8.5 oz 195 lb  Weight (kg) 88.814 kg 89.6 kg 88.451 kg      Telemetry    SB - Personally Reviewed  ECG    No new tracing   Physical Exam   GEN: No acute distress.   Neck: No JVD Cardiac: RRR, no murmurs, rubs, or gallops.  Respiratory: Clear to auscultation bilaterally. GI: Soft, nontender,  non-distended  MS: No edema; No deformity. Neuro:  Nonfocal  Psych: Normal affect   Labs    High Sensitivity Troponin:   Recent Labs  Lab 06/09/21 0115 06/09/21 0318  TROPONINIHS 52* 68*     Chemistry Recent Labs  Lab 06/09/21 0115 06/10/21 0439  NA 138 138  K 3.7 3.6  CL 102 104  CO2 26 27  GLUCOSE 137* 104*  BUN 14 10  CREATININE 0.93 1.02*  CALCIUM 10.0 9.4  PROT  --  6.5  ALBUMIN  --  3.2*  AST  --  13*  ALT  --  18  ALKPHOS  --  80  BILITOT  --  0.6  GFRNONAA >60 57*  ANIONGAP 10 7    Lipids  Recent Labs  Lab 06/10/21 0439  CHOL 150  TRIG 69  HDL 47  LDLCALC 89  CHOLHDL 3.2    Hematology Recent Labs  Lab 06/09/21 0115 06/10/21 0439  WBC 11.9* 8.0  RBC 4.53 3.88  HGB 13.5 11.9*  HCT 41.4 35.9*  MCV 91.4 92.5  MCH 29.8 30.7  MCHC 32.6 33.1  RDW 13.8 13.7  PLT 260 246   Thyroid  Recent Labs  Lab 06/09/21 2050  TSH 3.281    BNPNo results for input(s): BNP, PROBNP in the last 168 hours.  DDimer No results for input(s): DDIMER in the last 168 hours.   Radiology    DG Chest Port 1 View  Result Date: 06/09/2021 CLINICAL DATA:  Initial evaluation for acute intermittent chest pain. EXAM: PORTABLE CHEST 1 VIEW COMPARISON:  Radiograph from 02/22/2019. FINDINGS: Cardiomegaly, stable. Mediastinal silhouette within normal limits. Left-sided Port-A-Cath in place with tip overlying the distal SVC. Lungs normally inflated. Mild perihilar vascular congestion without overt pulmonary edema. No visible pleural effusion. No focal infiltrates. No pneumothorax. No acute osseous finding. IMPRESSION: 1. Cardiomegaly with mild perihilar vascular congestion without overt pulmonary edema. 2. No other active cardiopulmonary disease. Electronically Signed   By: Jeannine Boga M.D.   On: 06/09/2021 02:19    Cardiac Studies   N/a   Patient Profile     76 y.o. female with a hx of NSTEMI 2017 w/ DES dRCA, HTN, HLD, obesity, colon CA s/p R  hemicolectomy, CVA,  GERD, who was seen on 06/09/2021 for the evaluation of chest pain.  Assessment & Plan    NSTEMI with prior hx of CAD s/p stenting dRCA: hsTn 52>>68, EKG with TWI in inferolateral leads which were more prominent that prior tracings. Planned for cardiac cath today -- remains on IV heparin, ASA, metoprolol -- echo pending  HTN: blood pressures not well controlled -- on amlodipine 5mg  daily, will further increase to 10mg  daily -- continue Toprol 12.5mg  daily, will hold ARB with plans for cardiac cath today  HLD: LDL 89 -- hx of statin intolerance to Lipitor/Crestor -- on Zetia 10mg  daily  -- consider referral to outpatient lipid clinic at discharge   Hx of thyroid nodules: TSH WNL, prior biopsy benign   For questions or updates, please contact [facility] Please consult [email or web address] for contact info under        Signed, Reino Bellis, NP  06/10/2021, 9:13 AM     I have personally seen and examined this patient. I agree with the assessment and plan as outlined above.  Chest pain free this am. Mild troponin elevation and history of CAD with prior coronary stenting.  Planning for cardiac cath later today.  Pt is NPO  I have reviewed the risks, indications, and alternatives to cardiac catheterization, possible angioplasty, and stenting with the patient. Risks include but are not limited to bleeding, infection, vascular injury, stroke, myocardial infection, arrhythmia, kidney injury, radiation-related injury in the case of prolonged fluoroscopy use, emergency cardiac surgery, and death. The patient understands the risks of serious complication is 1-2 in 4742 with diagnostic cardiac cath and 1-2% or less with angioplasty/stenting.   Lauree Chandler 06/10/2021 10:50 AM

## 2021-06-10 NOTE — Progress Notes (Signed)
Mobility Specialist Progress Note:   06/10/21 1100  Mobility  Activity Ambulated in hall  Level of Assistance Standby assist, set-up cues, supervision of patient - no hands on  Assistive Device Front wheel walker  Distance Ambulated (ft) 400 ft  Mobility Ambulated with assistance in hallway  Mobility Response Tolerated well  Mobility performed by Mobility specialist  Bed Position Chair  $Mobility charge 1 Mobility   Pt received in bed willing to participate in mobility. No complaints of pain. Returned to chair with call bell in reach and all need met.   [facility] Public librarian Phone [phone] Secondary Phone [phone]

## 2021-06-10 NOTE — Plan of Care (Signed)
°  Problem: Education: °Goal: Knowledge of General Education information will improve °Description: Including pain rating scale, medication(s)/side effects and non-pharmacologic comfort measures °Outcome: Progressing °  °Problem: Clinical Measurements: °Goal: Ability to maintain clinical measurements within normal limits will improve °Outcome: Progressing °  °Problem: Clinical Measurements: °Goal: Cardiovascular complication will be avoided °Outcome: Progressing °  °

## 2021-06-10 NOTE — Progress Notes (Signed)
Mobility Specialist Progress Note:   06/10/21 1404  Mobility  Activity Ambulated in room  Level of Assistance Independent after set-up  Assistive Device Front wheel walker  Distance Ambulated (ft) 40 ft  Mobility Ambulated with assistance in room  Mobility Response Tolerated well  Mobility performed by Mobility specialist  $Mobility charge 1 Mobility   Pt received in chair. Ambulated in room to bathroom. Returned for bed for cath procedure.  [facility] Public librarian Phone [phone] Secondary Phone [phone]

## 2021-06-10 NOTE — Interval H&P Note (Signed)
Cath Lab Visit (complete for each Cath Lab visit)  Clinical Evaluation Leading to the Procedure:   ACS: Yes.    Non-ACS:    Anginal Classification: CCS Thomas  Anti-ischemic medical therapy: Minimal Therapy (1 class of medications)  Non-Invasive Test Results: No non-invasive testing performed  Prior CABG: Previous CABG      History and Physical Interval Note:  06/10/2021 5:16 PM  [name]  has presented today for surgery, with the diagnosis of unstable angina.  The various methods of treatment have been discussed with the patient and family. After consideration of risks, benefits and other options for treatment, the patient has consented to  Procedure(s): LEFT HEART CATH AND CORONARY ANGIOGRAPHY (N/A) as a surgical intervention.  The patient's history has been reviewed, patient examined, no change in status, stable for surgery.  I have reviewed the patient's chart and labs.  Questions were answered to the patient's satisfaction.     [name]

## 2021-06-10 NOTE — CV Procedure (Signed)
95% mid RCA within a heavily calcified dominant vessel. °Left main was patent °LAD contains eccentric 50 to 70% mid vessel stenosis unchanged from prior °Luminal irregularities noted in the large branching diagonal °Circumflex is free of significant obstruction °PCI on mid right coronary implanting a 3.5 x 24 drug-eluting stent deployed at 12 atm and postdilated to 3.75 mm in diameter and 14 atm x 2.  TIMI grade III flow was noted.  No immediate complications. °Procedure complicated by severe radial artery spasm and also tortuosity in the innominate artery and aorta preventing cannulation of either coronary artery.  We switched over to femoral approach using real-time vascular ultrasound for direct visualization and an anterior wall single pass stick.  4000 units of heparin was used initially in the arm.  After converting to femoral approach we used IV Angiomax. °

## 2021-06-10 NOTE — Progress Notes (Signed)
ANTICOAGULATION CONSULT NOTE - Follow up [facility] for IV Heparin  Indication: chest pain/ACS  Allergies  Allergen Reactions   Guaifenesin Er Palpitations   Lotensin [Benazepril Hcl] Palpitations   Propoxyphene Palpitations    Patient Measurements: Height: 5\' 2"  (157.5 cm) Weight: 88.8 kg (195 lb 12.8 oz) (scale a) IBW/kg (Calculated) : 50.1 Heparin dosing weight 70.7 kg  Vital Signs: Temp: 98.4 F (36.9 C) (01/03 0450) Temp Source: Oral (01/03 0450) BP: 170/76 (01/03 0450) Pulse Rate: 92 (01/03 0450)  Labs: Recent Labs    06/09/21 0115 06/09/21 0318 06/09/21 1148 06/09/21 2050 06/10/21 0439  HGB 13.5  --   --   --  11.9*  HCT 41.4  --   --   --  35.9*  PLT 260  --   --   --  246  HEPARINUNFRC  --   --  0.14* 0.45 0.55  CREATININE 0.93  --   --   --  1.02*  TROPONINIHS 52* 68*  --   --   --     Estimated Creatinine Clearance: 49.4 mL/min (A) (by C-G formula based on SCr of 1.02 mg/dL (H)).  Medical History: Past Medical History:  Diagnosis Date   Cancer of right colon ([place])    Constipation    CVA (cerebral infarction) 1996   Dyspnea    going up stairs and hills   Family history of colon cancer    GERD (gastroesophageal reflux disease)    History of cardiomegaly    Hyperlipidemia    Hypertension    Iron deficiency anemia    NSTEMI (non-ST elevated myocardial infarction) ([place]) 2017   Personal history of colonic polyps    PONV (postoperative nausea and vomiting)    Right shoulder pain    more painful at night , cold makes it worse   Severe obesity [facility])     Assessment: 76 yr old female presented with chest pain/NSTEMI. PMH includes NSTEMI in 2017 w/ DES, HTN, HLD, obesity, colon CA (S/P R hemicolectomy), CVA, and GERD. Pt has mildly elevated troponin (52>68). Pharmacy was consulted to start IV heparin. Planning for [facility] on 06/10/21.  Heparin level at goal (0.55) on 1000 units/hr. No bleeding issues noted. Hgb down slightly.   Goal of  Therapy:  Heparin level 0.3-0.7 units/ml Monitor platelets by anticoagulation protocol: Yes   Plan:  Continue heparin infusion at 1000 units/hr F/U after cardiac cath today, 06/10/21  Thank you for allowing [place] to participate in this patients care.  Erin Hearing PharmD., BCPS Clinical Pharmacist 06/10/2021 8:50 AM

## 2021-06-11 ENCOUNTER — Other Ambulatory Visit (HOSPITAL_COMMUNITY): Payer: Self-pay

## 2021-06-11 ENCOUNTER — Inpatient Hospital Stay (HOSPITAL_COMMUNITY): Payer: Medicare Other

## 2021-06-11 ENCOUNTER — Encounter: Payer: Self-pay | Admitting: Oncology

## 2021-06-11 ENCOUNTER — Encounter (HOSPITAL_COMMUNITY): Payer: Self-pay | Admitting: Interventional Cardiology

## 2021-06-11 DIAGNOSIS — E785 Hyperlipidemia, unspecified: Secondary | ICD-10-CM | POA: Diagnosis not present

## 2021-06-11 DIAGNOSIS — R079 Chest pain, unspecified: Secondary | ICD-10-CM

## 2021-06-11 DIAGNOSIS — I214 Non-ST elevation (NSTEMI) myocardial infarction: Secondary | ICD-10-CM | POA: Diagnosis not present

## 2021-06-11 LAB — ECHOCARDIOGRAM COMPLETE
AR max vel: 1.8 cm2
AV Area VTI: 1.97 cm2
AV Area mean vel: 1.92 cm2
AV Mean grad: 4 mmHg
AV Peak grad: 6.7 mmHg
Ao pk vel: 1.29 m/s
Area-P 1/2: 2.79 cm2
Height: 62 in
S' Lateral: 2.6 cm
Weight: 3132.8 oz

## 2021-06-11 LAB — MRSA NEXT GEN BY PCR, NASAL: MRSA by PCR Next Gen: NOT DETECTED

## 2021-06-11 MED ORDER — SODIUM CHLORIDE 0.9 % IV SOLN
6.2500 mg | Freq: Four times a day (QID) | INTRAVENOUS | Status: DC | PRN
  Administered 2021-06-11: 6.25 mg via INTRAVENOUS
  Filled 2021-06-11: qty 0.25

## 2021-06-11 MED ORDER — PERFLUTREN LIPID MICROSPHERE
1.0000 mL | INTRAVENOUS | Status: AC | PRN
  Administered 2021-06-11: 2 mL via INTRAVENOUS
  Filled 2021-06-11: qty 10

## 2021-06-11 MED FILL — Nitroglycerin IV Soln 100 MCG/ML in D5W: INTRA_ARTERIAL | Qty: 10 | Status: AC

## 2021-06-11 MED FILL — Heparin Sod (Porcine)-NaCl IV Soln 1000 Unit/500ML-0.9%: INTRAVENOUS | Qty: 1000 | Status: AC

## 2021-06-11 NOTE — Progress Notes (Signed)
Post Sheath Removal Note  Enid Derry stated ACT 161 at 11pm from [facility] RN but no follow up.  Pre VS-  patient nauseated on my arrival and stated she has been all night.  Rechecked BP 165/75 at 0700.  SR- 68  right DP palpable  Pressurized 42fr sheath RFA site level 0   Manual held for 49min. Patient stated continue nausea.  Post VS- 146/81  SR- 82 Right DP palpable Patient given post instructions and continue to be nauseated/coughing. Patient verbalizes understanding of instructions. Tegaderm placed.    Day RN- Meliton Rattan RN assesed site  Level 0 and given post instructions as well. Advised to try to get an order to control nausea to keep patient from coughing for better control of the site.

## 2021-06-11 NOTE — TOC Progression Note (Signed)
Transition of Care [facility]) - Progression Note    Patient Details  Name: [name] MRN: [id number] Date of Birth: 1946-02-20  Transition of Care [facility]) CM/SW Contact  Zenon Mayo, RN Phone Number: 06/11/2021, 8:25 PM  Clinical Narrative:    From home, for heart cath today, having N/V, plan dc tomorrow.  TOC will continue to follow for dc needs.         Expected Discharge Plan and Services                                                 Social Determinants of Health (SDOH) Interventions    Readmission Risk Interventions No flowsheet data found.

## 2021-06-11 NOTE — Progress Notes (Incomplete)
At shift change, pt having n/v. Cath lab RN pulled sheath and this RN assessed area. Pt requesting something else for back pain after vomiting up oxycodone pm RN administered. PA paged and is coming to bedside.

## 2021-06-11 NOTE — Progress Notes (Signed)
RN made aware by phlebotomy that the patient is refusing her labs this morning. Will continue to monitor.

## 2021-06-11 NOTE — Progress Notes (Signed)
CARDIAC REHAB PHASE I   Went to offer to walk with pt. Pt drowsy and c/o nausea today. MI materials left at bedside. Will f/u to educate and encourage ambulation as able.  [id number] Rufina Falco, RN BSN 06/11/2021 2:02 PM

## 2021-06-11 NOTE — Progress Notes (Addendum)
Progress Note  Patient Name: [name] Date of Encounter: 06/11/2021  [facility] Cardiologist: Peter Martinique, MD   Subjective   No chest pain. Nausea through the night. Femoral sheath removed this am.   Inpatient Medications    Scheduled Meds:  amLODipine  10 mg Oral Daily   aspirin  81 mg Oral Daily   Chlorhexidine Gluconate Cloth  6 each Topical Daily   ezetimibe  10 mg Oral Daily   ferrous sulfate  325 mg Oral Q breakfast   irbesartan  150 mg Oral Daily   metoprolol succinate  12.5 mg Oral Daily   pantoprazole  40 mg Oral Daily   sodium chloride flush  3 mL Intravenous Q12H   sodium chloride flush  3 mL Intravenous Q12H   ticagrelor  90 mg Oral BID   vitamin B-12  1,000 mcg Oral Daily   Continuous Infusions:  sodium chloride     sodium chloride     PRN Meds: sodium chloride, sodium chloride, acetaminophen, ALPRAZolam, nitroGLYCERIN, ondansetron (ZOFRAN) IV, oxyCODONE, sodium chloride flush, sodium chloride flush, zolpidem   Vital Signs    Vitals:   06/10/21 2110 06/10/21 2330 06/11/21 0300 06/11/21 0400  BP:  (!) 147/67 (!) 170/66 135/61  Pulse: 82 71 63 68  Resp: 16 13 12 16   Temp:  97.8 F (36.6 C) 97.8 F (36.6 C)   TempSrc:  Oral Oral   SpO2: 100% 97% 97% 97%  Weight:      Height:        Intake/Output Summary (Last 24 hours) at 06/11/2021 0731 Last data filed at 06/11/2021 0400 Gross per 24 hour  Intake 850.84 ml  Output 400 ml  Net 450.84 ml   Last 3 Weights 06/10/2021 06/09/2021 06/09/2021  Weight (lbs) 195 lb 12.8 oz 197 lb 8.5 oz 195 lb  Weight (kg) 88.814 kg 89.6 kg 88.451 kg      Telemetry    Sinus- Personally Reviewed  ECG    No AM EKG  Physical Exam   General: Well developed, well nourished, NAD  HEENT: OP clear, mucus membranes moist  SKIN: warm, dry. No rashes. Neuro: No focal deficits  Musculoskeletal: Muscle strength 5/5 all ext  Psychiatric: Mood and affect normal  Neck: No JVD Lungs:Clear bilaterally, no  wheezes, rhonci, crackles Cardiovascular: Regular rate and rhythm. No murmurs, gallops or rubs. Abdomen:Soft. Bowel sounds present. Non-tender.  Extremities: No lower extremity edema. Right groin soft. No hematoma.   Labs    High Sensitivity Troponin:   Recent Labs  Lab 06/09/21 0115 06/09/21 0318  TROPONINIHS 52* 68*     Chemistry Recent Labs  Lab 06/09/21 0115 06/10/21 0439  NA 138 138  K 3.7 3.6  CL 102 104  CO2 26 27  GLUCOSE 137* 104*  BUN 14 10  CREATININE 0.93 1.02*  CALCIUM 10.0 9.4  PROT  --  6.5  ALBUMIN  --  3.2*  AST  --  13*  ALT  --  18  ALKPHOS  --  80  BILITOT  --  0.6  GFRNONAA >60 57*  ANIONGAP 10 7    Lipids  Recent Labs  Lab 06/10/21 0439  CHOL 150  TRIG 69  HDL 47  LDLCALC 89  CHOLHDL 3.2    Hematology Recent Labs  Lab 06/09/21 0115 06/10/21 0439  WBC 11.9* 8.0  RBC 4.53 3.88  HGB 13.5 11.9*  HCT 41.4 35.9*  MCV 91.4 92.5  MCH 29.8 30.7  MCHC 32.6  33.1  RDW 13.8 13.7  PLT 260 246   Thyroid  Recent Labs  Lab 06/09/21 2050  TSH 3.281    BNPNo results for input(s): BNP, PROBNP in the last 168 hours.  DDimer No results for input(s): DDIMER in the last 168 hours.   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  The previously placed distal stent is widely patent Successful drug-eluting stent using a 24 x 3.5 mm Synergy postdilated to 3.75 mm in diameter with TIMI grade III flow but residual underexpansion in the very proximal portion of the stent and in the mid vessel with each area being approximately 35% in stenosis. Left main is widely patent LAD contains a mid vessel 50 to 60% stenosis unchanged from prior study. Luminal irregularities are noted in the circumflex LV function is normal with EF 55% RECOMMENDATIONS: The patient received excess anticoagulation in the Cath Lab (11,000 units of IV heparin) and an  IV bivalirudin infusion during PCI.  This led to excessive ACT's greater than 600 seconds.  In reconciling the case and additional 6000 units of heparin was administered by mistake and without indication.  Plan is to pull she is much later than would ordinarily occur to avoid the risk of bleeding. Aspirin and Brilinta x1 year. Higher than normal risk for restenosis due to diffuse calcification and at least 2 spots within the stented segment that are not fully expanded although not severely. If all goes well, the patient will be able to discharge in a.m.    Cardiac Studies     Patient Profile     76 y.o. female with a hx of NSTEMI 2017 w/ DES dRCA, HTN, HLD, obesity, colon CA s/p R  hemicolectomy, CVA, GERD, who was admitted with a NSTEMI  Assessment & Plan    NSTEMI: She is having nausea this morning. Cardiac cath yesterday with severe mid RCA stenosis treated with a drug eluting stent. Will continue DAPT with ASA and Brilinta. Continue beta blocker. She is statin intolerant. Continue Zetia. Outpatient referral to lipid clinic for discussion regarding Repatha/Praluent.  EKG shows no ischemic changes this am. Her nausea does not appear to be related to ischemia.  -Echo pending today.  -She is now agreeing to have her BMET and CBC   HTN: BP elevated overnight but controlled this am. Norvasc increased to 10 mg yesterday. Resume ARB before discharge. Continue Toprol.   HLD: LDL 89. History of statin intolerance to Lipitor/Crestor. Continue Zetia and refer to lipid clinic.   Will not discharge home today given ongoing nausea. Will try Phenergan.   For questions or updates, please contact [place] Please consult [email or web address] for contact info under        Signed, Lauree Chandler, MD  06/11/2021, 7:31 AM

## 2021-06-11 NOTE — TOC Benefit Eligibility Note (Signed)
Patient Teacher, English as a foreign language completed.    The patient is currently admitted and upon discharge could be taking Brilinta 90 mg.  The current 30 day co-pay is, $37.00.   The patient is insured through [place], [place] Patient Advocate Specialist [place] Patient Advocate Team Direct Number: [phone]  Fax: [phone]

## 2021-06-11 NOTE — Progress Notes (Signed)
Mobility Specialist Progress Note:   06/11/21 1433  Mobility  Activity Refused mobility   Pt on BSC, stated she is still nauseous and doesn't want to ambulate today.   [facility] Public librarian Phone [phone] Secondary Phone [phone]

## 2021-06-11 NOTE — Progress Notes (Signed)
PT Cancellation Note  Patient Details Name: [name] MRN: [id number] DOB: 1946-05-06   Cancelled Treatment:    Reason Eval/Treat Not Completed: Medical issues which prohibited therapy. Pt remains on bedrest post cardiac cath. Pt also presenting with N&V. PT to re-attempt as time allows.   Lorriane Shire 06/11/2021, 9:20 AM  Lorrin Goodell, PT  Office # [phone] Pager [phone]

## 2021-06-12 ENCOUNTER — Telehealth: Payer: Self-pay | Admitting: Physician Assistant

## 2021-06-12 ENCOUNTER — Telehealth (HOSPITAL_COMMUNITY): Payer: Self-pay | Admitting: Pharmacist

## 2021-06-12 DIAGNOSIS — I214 Non-ST elevation (NSTEMI) myocardial infarction: Secondary | ICD-10-CM | POA: Diagnosis not present

## 2021-06-12 DIAGNOSIS — E785 Hyperlipidemia, unspecified: Secondary | ICD-10-CM | POA: Diagnosis not present

## 2021-06-12 LAB — CBC
HCT: 36.5 % (ref 36.0–46.0)
Hemoglobin: 12.3 g/dL (ref 12.0–15.0)
MCH: 30.4 pg (ref 26.0–34.0)
MCHC: 33.7 g/dL (ref 30.0–36.0)
MCV: 90.1 fL (ref 80.0–100.0)
Platelets: 246 10*3/uL (ref 150–400)
RBC: 4.05 MIL/uL (ref 3.87–5.11)
RDW: 13.2 % (ref 11.5–15.5)
WBC: 10.6 10*3/uL — ABNORMAL HIGH (ref 4.0–10.5)
nRBC: 0 % (ref 0.0–0.2)

## 2021-06-12 LAB — BASIC METABOLIC PANEL
Anion gap: 7 (ref 5–15)
BUN: 10 mg/dL (ref 8–23)
CO2: 24 mmol/L (ref 22–32)
Calcium: 9.4 mg/dL (ref 8.9–10.3)
Chloride: 104 mmol/L (ref 98–111)
Creatinine, Ser: 1.02 mg/dL — ABNORMAL HIGH (ref 0.44–1.00)
GFR, Estimated: 57 mL/min — ABNORMAL LOW (ref >60)
Glucose, Bld: 111 mg/dL — ABNORMAL HIGH (ref 70–99)
Potassium: 3.3 mmol/L — ABNORMAL LOW (ref 3.5–5.1)
Sodium: 135 mmol/L (ref 135–145)

## 2021-06-12 MED ORDER — METOPROLOL SUCCINATE ER 25 MG PO TB24: 12.5000 mg | ORAL_TABLET | Freq: Every day | ORAL | 3 refills | Status: DC

## 2021-06-12 MED ORDER — AMLODIPINE BESYLATE 10 MG PO TABS: 10.0000 mg | ORAL_TABLET | Freq: Every day | ORAL | 2 refills | Status: DC

## 2021-06-12 MED ORDER — POTASSIUM CHLORIDE CRYS ER 20 MEQ PO TBCR
40.0000 meq | EXTENDED_RELEASE_TABLET | Freq: Once | ORAL | Status: AC
Start: 2021-06-12 — End: 2021-06-12
  Administered 2021-06-12: 40 meq via ORAL
  Filled 2021-06-12: qty 2

## 2021-06-12 MED ORDER — NITROGLYCERIN 0.4 MG SL SUBL: 0.4000 mg | SUBLINGUAL_TABLET | SUBLINGUAL | 3 refills | Status: DC | PRN

## 2021-06-12 MED ORDER — TICAGRELOR 90 MG PO TABS: 90.0000 mg | ORAL_TABLET | Freq: Two times a day (BID) | ORAL | 11 refills | Status: DC

## 2021-06-12 MED ORDER — DOCUSATE SODIUM 100 MG PO CAPS: 100.0000 mg | ORAL_CAPSULE | Freq: Two times a day (BID) | ORAL | Status: DC | PRN

## 2021-06-12 NOTE — Progress Notes (Signed)
RN went over d/c summary with pt. NT removing PIVs and transporting pt to d/c lounge. Belongings with pt. Pt's husband will transport her home.

## 2021-06-12 NOTE — Plan of Care (Signed)

## 2021-06-12 NOTE — Telephone Encounter (Signed)
Patient is scheduled to see Almyra Deforest 06/24/21 at 11:20 AM for a TOC f/u. Patient is to arrive at 11:45 AM.

## 2021-06-12 NOTE — Progress Notes (Signed)
CARDIAC REHAB PHASE I   Offered to walk with pt. Pt states she was up to bathroom and too fatigued after to ambulate. Pt educated on importance of ASA, Brilinta, and NTG. Pt states difficulty affording medications in the past due to fixed income. Pt given MI book and heart healthy diet. Reviewed site care and restrictions. Encouraged ambulation with emphasis on safety. Pt states falling regularly. Reviewed signs and symptoms of bleeding. Will refer to CRP II [place].   [id number] Cassie Falco, RN BSN 06/12/2021 10:30 AM

## 2021-06-12 NOTE — Telephone Encounter (Signed)
Hello,  The Pharmacy team is conducting a discharge transitions of care quality improvement initiative. The recommendations below are for your consideration.    [name] is a 76 y.o. female (MRN: [id number], DOB: [birth year]) who was recently hospitalized on 06/09/2021 for NSTEMI. They are anticipated to visit your clinic for post-discharge follow-up and may benefit from assistance with medication initiation and/or access.     Relevant medication access issues which may benefit from further intervention include: -The cost of Brilinta is $37 per month. Anticipate no need to transition to plavix  Other relevant medication issues from their recent admission include: -She was on ezetimibe prior to admission. LDL= 89 and she is statin intolerant. She has been referred to the lipid clinic (previously PCSK9 therapy has been too expensive but consider re-trying)   We appreciate your assistance with the implementation of these recommendations. Please let us know if there is anything we can help you with at this time.      Thank you, Hildred Laser, PharmD Clinical Pharmacist **Pharmacist phone directory can now be found on [email or web address] ([email or web address]).  Listed under [place].

## 2021-06-12 NOTE — Progress Notes (Signed)
Progress Note  Patient Name: [name] Date of Encounter: 06/12/2021  [facility] Cardiologist: Peter Martinique, MD   Subjective   Feeling much better. Nausea resolved. No chest pain  Inpatient Medications    Scheduled Meds:  amLODipine  10 mg Oral Daily   aspirin  81 mg Oral Daily   Chlorhexidine Gluconate Cloth  6 each Topical Daily   ezetimibe  10 mg Oral Daily   ferrous sulfate  325 mg Oral Q breakfast   irbesartan  150 mg Oral Daily   metoprolol succinate  12.5 mg Oral Daily   pantoprazole  40 mg Oral Daily   sodium chloride flush  3 mL Intravenous Q12H   sodium chloride flush  3 mL Intravenous Q12H   ticagrelor  90 mg Oral BID   vitamin B-12  1,000 mcg Oral Daily   Continuous Infusions:  sodium chloride     sodium chloride     promethazine (PHENERGAN) injection (IM or IVPB) Stopped (06/11/21 1347)   PRN Meds: sodium chloride, sodium chloride, acetaminophen, ALPRAZolam, nitroGLYCERIN, ondansetron (ZOFRAN) IV, oxyCODONE, promethazine (PHENERGAN) injection (IM or IVPB), sodium chloride flush, sodium chloride flush, zolpidem   Vital Signs    Vitals:   06/11/21 1910 06/11/21 2107 06/11/21 2315 06/12/21 0448  BP:  (!) 148/79 (!) 157/77   Pulse: 92 86 93 99  Resp: 16 19 18 14   Temp:  98.3 F (36.8 C) 97.8 F (36.6 C)   TempSrc:  Oral Oral   SpO2: 99% 95% 95% 94%  Weight:    87.3 kg  Height:        Intake/Output Summary (Last 24 hours) at 06/12/2021 0717 Last data filed at 06/12/2021 0448 Gross per 24 hour  Intake 273 ml  Output 500 ml  Net -227 ml   Last 3 Weights 06/12/2021 06/10/2021 06/09/2021  Weight (lbs) 192 lb 7.4 oz 195 lb 12.8 oz 197 lb 8.5 oz  Weight (kg) 87.3 kg 88.814 kg 89.6 kg      Telemetry    NSR - Personally Reviewed  ECG    No AM EKG  Physical Exam    General: Well developed, well nourished, NAD  HEENT: OP clear, mucus membranes moist  SKIN: warm, dry. No rashes. Neuro: No focal deficits  Musculoskeletal: Muscle  strength 5/5 all ext  Psychiatric: Mood and affect normal  Neck: No JVD Lungs:Clear bilaterally, no wheezes, rhonci, crackles Cardiovascular: Regular rate and rhythm. No murmurs, gallops or rubs. Abdomen:Soft. Bowel sounds present. Non-tender.  Extremities: No lower extremity edema. Right groin without hematoma  Labs    High Sensitivity Troponin:   Recent Labs  Lab 06/09/21 0115 06/09/21 0318  TROPONINIHS 52* 68*     Chemistry Recent Labs  Lab 06/09/21 0115 06/10/21 0439 06/12/21 0059  NA 138 138 135  K 3.7 3.6 3.3*  CL 102 104 104  CO2 26 27 24   GLUCOSE 137* 104* 111*  BUN 14 10 10   CREATININE 0.93 1.02* 1.02*  CALCIUM 10.0 9.4 9.4  PROT  --  6.5  --   ALBUMIN  --  3.2*  --   AST  --  13*  --   ALT  --  18  --   ALKPHOS  --  80  --   BILITOT  --  0.6  --   GFRNONAA >60 57* 57*  ANIONGAP 10 7 7     Lipids  Recent Labs  Lab 06/10/21 0439  CHOL 150  TRIG 69  HDL 47  LDLCALC 89  CHOLHDL 3.2    Hematology Recent Labs  Lab 06/09/21 0115 06/10/21 0439 06/12/21 0059  WBC 11.9* 8.0 10.6*  RBC 4.53 3.88 4.05  HGB 13.5 11.9* 12.3  HCT 41.4 35.9* 36.5  MCV 91.4 92.5 90.1  MCH 29.8 30.7 30.4  MCHC 32.6 33.1 33.7  RDW 13.8 13.7 13.2  PLT 260 246 246   Thyroid  Recent Labs  Lab 06/09/21 2050  TSH 3.281    BNPNo results for input(s): BNP, PROBNP in the last 168 hours.  DDimer No results for input(s): DDIMER in the last 168 hours.   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  The previously placed distal stent is widely patent Successful drug-eluting stent using a 24 x 3.5 mm Synergy postdilated to 3.75 mm in diameter with TIMI grade III flow but residual underexpansion in the very proximal portion of the stent and in the mid vessel with each area being approximately 35% in stenosis. Left main is widely patent LAD contains a mid vessel  50 to 60% stenosis unchanged from prior study. Luminal irregularities are noted in the circumflex LV function is normal with EF 55% RECOMMENDATIONS: The patient received excess anticoagulation in the Cath Lab (11,000 units of IV heparin) and an IV bivalirudin infusion during PCI.  This led to excessive ACT's greater than 600 seconds.  In reconciling the case and additional 6000 units of heparin was administered by mistake and without indication.  Plan is to pull she is much later than would ordinarily occur to avoid the risk of bleeding. Aspirin and Brilinta x1 year. Higher than normal risk for restenosis due to diffuse calcification and at least 2 spots within the stented segment that are not fully expanded although not severely. If all goes well, the patient will be able to discharge in a.m.   ECHOCARDIOGRAM COMPLETE  Result Date: 06/11/2021    ECHOCARDIOGRAM REPORT   Patient Name:   [name] Date of Exam: 06/11/2021 Medical Rec #:  [id number]                Height:       62.0 in Accession #:    [id number]               Weight:       195.8 lb Date of Birth:  [birth year]                BSA:          1.895 m Patient Age:    76 years                 BP:           135/61 mmHg Patient Gender: F                        HR:           65 bpm. Exam Location:  Inpatient Procedure: 2D Echo, Cardiac Doppler, Color Doppler and Intracardiac            Opacification Agent Indications:    R07.9* Chest pain, unspecified  History:        Patient has no prior history of Echocardiogram examinations. CAD                 and Previous Myocardial Infarction; Risk Factors:Hypertension.  Sonographer:  Glo Herring Referring Phys: [place] Comments: Technically difficult study due to poor echo windows. IMPRESSIONS  1. Difficult study due to poor echo windows.  2. Left ventricular ejection fraction, by estimation, is 60 to 65%. The left ventricle has normal function. The left ventricle has no  regional wall motion abnormalities. There is mild left ventricular hypertrophy. Left ventricular diastolic parameters are consistent with Grade I diastolic dysfunction (impaired relaxation).  3. Right ventricular systolic function is normal. The right ventricular size is normal.  4. The mitral valve is grossly normal. Trivial mitral valve regurgitation.  5. The aortic valve is tricuspid. There is mild thickening of the aortic valve. Aortic valve regurgitation is not visualized. Aortic valve sclerosis is present, with no evidence of aortic valve stenosis.  6. Aortic dilatation noted. There is borderline dilatation of the aortic root, measuring 37 mm. Comparison(s): No prior Echocardiogram. FINDINGS  Left Ventricle: Left ventricular ejection fraction, by estimation, is 60 to 65%. The left ventricle has normal function. The left ventricle has no regional wall motion abnormalities. Definity contrast agent was given IV to delineate the left ventricular  endocardial borders. The left ventricular internal cavity size was normal in size. There is mild left ventricular hypertrophy. Left ventricular diastolic parameters are consistent with Grade I diastolic dysfunction (impaired relaxation). Right Ventricle: The right ventricular size is normal. Right vetricular wall thickness was not well visualized. Right ventricular systolic function is normal. Left Atrium: Left atrial size was not well visualized. Right Atrium: Right atrial size was not well visualized. Pericardium: There is no evidence of pericardial effusion. Mitral Valve: The mitral valve is grossly normal. Trivial mitral valve regurgitation. Tricuspid Valve: The tricuspid valve is not well visualized. Tricuspid valve regurgitation is not demonstrated. Aortic Valve: The aortic valve is tricuspid. There is mild thickening of the aortic valve. Aortic valve regurgitation is not visualized. Aortic valve sclerosis is present, with no evidence of aortic valve stenosis.  Aortic valve mean gradient measures 4.0  mmHg. Aortic valve peak gradient measures 6.7 mmHg. Aortic valve area, by VTI measures 1.97 cm. Pulmonic Valve: The pulmonic valve was not well visualized. Pulmonic valve regurgitation is not visualized. Aorta: Aortic dilatation noted. There is borderline dilatation of the aortic root, measuring 37 mm. Venous: The inferior vena cava was not well visualized. IAS/Shunts: The atrial septum is grossly normal.  LEFT VENTRICLE PLAX 2D LVIDd:         3.80 cm   Diastology LVIDs:         2.60 cm   LV e' medial:    6.09 cm/s LV PW:         1.10 cm   LV E/e' medial:  12.6 LV IVS:        1.10 cm   LV e' lateral:   7.72 cm/s LVOT diam:     1.90 cm   LV E/e' lateral: 9.9 LV SV:         58 LV SV Index:   31 LVOT Area:     2.84 cm  RIGHT VENTRICLE             IVC RV S prime:     13.20 cm/s  IVC diam: 2.00 cm LEFT ATRIUM           Index LA diam:      3.20 cm 1.69 cm/m LA Vol (A4C): 47.8 ml 25.23 ml/m  AORTIC VALVE  PULMONIC VALVE AV Area (Vmax):    1.80 cm     PV Vmax:       1.07 m/s AV Area (Vmean):   1.92 cm     PV Peak grad:  4.6 mmHg AV Area (VTI):     1.97 cm AV Vmax:           129.00 cm/s AV Vmean:          87.700 cm/s AV VTI:            0.295 m AV Peak Grad:      6.7 mmHg AV Mean Grad:      4.0 mmHg LVOT Vmax:         81.70 cm/s LVOT Vmean:        59.500 cm/s LVOT VTI:          0.205 m LVOT/AV VTI ratio: 0.69  AORTA Ao Root diam: 3.10 cm Ao Asc diam:  3.30 cm MITRAL VALVE MV Area (PHT): 2.79 cm     SHUNTS MV Decel Time: 272 msec     Systemic VTI:  0.20 m MV E velocity: 76.60 cm/s   Systemic Diam: 1.90 cm MV A velocity: 103.00 cm/s MV E/A ratio:  0.74 Gwyndolyn Kaufman MD Electronically signed by Gwyndolyn Kaufman MD Signature Date/Time: 06/11/2021/12:04:45 PM    Final     Cardiac Studies     Patient Profile     76 y.o. female with a hx of NSTEMI 2017 w/ DES dRCA, HTN, HLD, obesity, colon CA s/p R  hemicolectomy, CVA, GERD, who was admitted with a  NSTEMI  Assessment & Plan    NSTEMI: Cardiac cath 06/10/21 with severe mid RCA stenosis treated with a drug eluting stent. No chest pain. Nausea resolved. Echo with normal LV systolic function. Continue DAPT with ASA and Brilinta.  Continue beta blocker.  She is statin intolerant. Continue Zetia. Outpatient referral to lipid clinic for discussion regarding Repatha/Praluent.   HTN: BP overall controlled. Norvasc increased to 10 mg during admission. She is now back on her ARB and also on Toprol.  No further changes today  HLD: LDL 89. History of statin intolerance to Lipitor/Crestor. Continue Zetia and refer to lipid clinic.   Hypokalemia: Replace potassium this am.   Discharge home today. Follow up with Dr. Martinique  For questions or updates, please contact [place] Please consult [email or web address] for contact info under        Signed, Lauree Chandler, MD  06/12/2021, 7:17 AM

## 2021-06-12 NOTE — Progress Notes (Signed)
Physical Therapy Treatment Patient Details Name: [name] MRN: [id number] DOB: 1946-02-13 Today's Date: 06/12/2021   History of Present Illness Pt is a 76 y.o. female admitted 06/09/21 for evaluation of chest pain; workup for NSTEMI. Cardiac cath 1/3 showed severe RCA stenosis treated with drug-eluting stent. PMH includes colon CA, CVA, HTN, NSTEMI, stents.   PT Comments    Pt progressing with mobility. Today's session focused on transfers and ADL tasks, pt moving well with RW and supervision for safety/lines. Educ re: activity packing, breathing strategies, energy conservation. Pt motivated to participate. Will continue to follow acutely to address established goals.  HR up to 120s with activity SpO2 94% on RA    Recommendations for follow up therapy are one component of a multi-disciplinary discharge planning process, led by the attending physician.  Recommendations may be updated based on patient status, additional functional criteria and insurance authorization.  Follow Up Recommendations  No PT follow up     Assistance Recommended at Discharge Intermittent Supervision/Assistance       Equipment Recommendations  None recommended by PT    Recommendations for Other Services       Precautions / Restrictions Precautions Precautions: Fall Restrictions Weight Bearing Restrictions: No     Mobility  Bed Mobility Overal bed mobility: Modified Independent             General bed mobility comments: increased time    Transfers Overall transfer level: Needs assistance Equipment used: Rolling walker (2 wheels) Transfers: Sit to/from Stand Sit to Stand: Supervision           General transfer comment: multiple sit<>stands from EOB and BSC (over toilet) to RW, supervision for safety/lines    Ambulation/Gait Ambulation/Gait assistance: Supervision Gait Distance (Feet): 24 Feet Assistive device: Rolling walker (2 wheels) Gait Pattern/deviations:  Step-through pattern;Decreased stride length;Trunk flexed Gait velocity: Decreased     General Gait Details: Slow, steady gait with RW and supervision for safety/lines; further mobility deferred secondary to abdominal discomfort and fatigue after completing ADL tasks   Stairs             Wheelchair Mobility    Modified Rankin (Stroke Patients Only)       Balance Overall balance assessment: Needs assistance   Sitting balance-Leahy Scale: Good Sitting balance - Comments: able to don bilateral socks and depends sitting EOB                                    Cognition Arousal/Alertness: Awake/alert Behavior During Therapy: WFL for tasks assessed/performed Overall Cognitive Status: Within Functional Limits for tasks assessed                                          Exercises      General Comments General comments (skin integrity, edema, etc.): Significant increased time, DOE 2-3/4 noted, completing seated ADL tasks (donning socks and Depends, brushing hair, using toilet) - cues for activity pacing, including taking upright rest breaks for breathing (as opposed to staying bent over entire task), activity pacing, energy conservation strategies. HR up to 120s, SpO2 94% on RA. Endorses constipation, has not had BM since admission (RN notified)      Pertinent Vitals/Pain Pain Assessment: Faces Faces Pain Scale: Hurts little more Pain Location: abdomen (feels constipated) Pain Descriptors / Indicators: Discomfort  Pain Intervention(s): Monitored during session    Home Living                          Prior Function            PT Goals (current goals can now be found in the care plan section) Progress towards PT goals: Progressing toward goals    Frequency    Min 3X/week      PT Plan Current plan remains appropriate    Co-evaluation              AM-PAC PT "6 Clicks" Mobility   Outcome Measure  Help needed  turning from your back to your side while in a flat bed without using bedrails?: None Help needed moving from lying on your back to sitting on the side of a flat bed without using bedrails?: None Help needed moving to and from a bed to a chair (including a wheelchair)?: A Little Help needed standing up from a chair using your arms (e.g., wheelchair or bedside chair)?: A Little Help needed to walk in hospital room?: A Little Help needed climbing 3-5 steps with a railing? : A Little 6 Click Score: 20    End of Session   Activity Tolerance: Patient tolerated treatment well;Patient limited by fatigue Patient left: in chair;with call bell/phone within reach Nurse Communication: Mobility status PT Visit Diagnosis: Other abnormalities of gait and mobility (R26.89)     Time: 7425-9563 PT Time Calculation (min) (ACUTE ONLY): 31 min  Charges:  $Therapeutic Activity: 8-22 mins $Self Care/Home Management: 8-22                     Mabeline Caras, PT, DPT Acute Rehabilitation Services  Pager [phone] Office [place] 06/12/2021, 9:38 AM

## 2021-06-12 NOTE — Care Management Important Message (Signed)
Important Message  Patient Details  Name: [name] MRN: [id number] Date of Birth: 10-27-1945   Medicare Important Message Given:  Yes     Elyon Zoll [address] 06/12/2021, 3:11 PM

## 2021-06-12 NOTE — Telephone Encounter (Signed)
Still admitted as of 01/05.  Thank you!

## 2021-06-12 NOTE — Discharge Summary (Signed)
Discharge Summary    Patient ID: [name] MRN: [id number]; DOB: 1946-02-12  Admit date: 06/09/2021 Discharge date: 06/12/2021  PCP:  Chesley Noon, MD   [facility] Providers Cardiologist:  Peter Martinique, MD        Discharge Diagnoses    Principal Problem:   Unstable angina [facility]) Active Problems:   Dyslipidemia, goal LDL below 70   CAD S/P percutaneous coronary angioplasty   NSTEMI (non-ST elevated myocardial infarction) [facility])    Diagnostic Studies/Procedures    Cath 06/10/2021 Conclusions: De novo 95% mid RCA stenosis with an a heavily calcified and diffusely diseased segment.  The previously placed distal stent is widely patent Successful drug-eluting stent using a 24 x 3.5 mm Synergy postdilated to 3.75 mm in diameter with TIMI grade III flow but residual underexpansion in the very proximal portion of the stent and in the mid vessel with each area being approximately 35% in stenosis. Left main is widely patent LAD contains a mid vessel 50 to 60% stenosis unchanged from prior study. Luminal irregularities are noted in the circumflex LV function is normal with EF 55%   RECOMMENDATIONS:   The patient received excess anticoagulation in the Cath Lab (11,000 units of IV heparin) and an IV bivalirudin infusion during PCI.  This led to excessive ACT's greater than 600 seconds.  In reconciling the case and additional 6000 units of heparin was administered by mistake and without indication.  Plan is to pull she is much later than would ordinarily occur to avoid the risk of bleeding. Aspirin and Brilinta x1 year. Higher than normal risk for restenosis due to diffuse calcification and at least 2 spots within the stented segment that are not fully expanded although not severely. If all goes well, the patient will be able to discharge in a.m.   Cath 06/11/2021 1. Difficult study due to poor echo windows.   2. Left ventricular ejection fraction, by estimation, is 60  to 65%. The  left ventricle has normal function. The left ventricle has no regional  wall motion abnormalities. There is mild left ventricular hypertrophy.  Left ventricular diastolic parameters  are consistent with Grade I diastolic dysfunction (impaired relaxation).   3. Right ventricular systolic function is normal. The right ventricular  size is normal.   4. The mitral valve is grossly normal. Trivial mitral valve  regurgitation.   5. The aortic valve is tricuspid. There is mild thickening of the aortic  valve. Aortic valve regurgitation is not visualized. Aortic valve  sclerosis is present, with no evidence of aortic valve stenosis.   6. Aortic dilatation noted. 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   Comparison(s): No prior Echocardiogram.  _____________   History of Present Illness     [name] is a 76 y.o. female with  a hx of NSTEMI 2017 w/ DES dRCA, HTN, HLD, obesity, colon CA s/p R  hemicolectomy, CVA, GERD, who is being seen 06/09/2021 for the evaluation of chest pain.  Ms. Quraishi was not having chest pain when she last saw Dr. Martinique in 2021.   In the last couple of years, she has had significant mobility issues and at one point was unable to walk.  She has had physical therapy and now walks with the aid of a walker.  However, her activity level is very low.  Her husband and daughter help in her care.  She has not been having chest pain with exertion.  A few  days after Christmas, she had an episode of chest pain that was substernal, she describes it as discomfort but not severe.  Up to a 6/10.  She did not take anything for it.  It resolved after about an hour.  She is not sure about other associated symptoms.  Yesterday, she had another episode of that same chest pain.  It lasted longer and just would not resolve.  It was associated with nausea, but she denies shortness of breath or diaphoresis.  The pain was not positional and  did not change with deep inspiration.  It started at rest.  She did not take any medications for it.  Her left arm also had a heavy feeling and was a little numb.   When it did not resolve, she went to the [facility] ER.  She was initially given aspirin and started on heparin.  She does not remember getting anything specifically for the pain, but it did resolve.  She thinks the nausea resolved as well.   Cardiology was consulted by phone and she was transferred to [facility].  In route, she became very nauseated and got Zofran.  Her nausea resolved.  The chest pain has not returned.   The chest pain she had at the time of her MI in 2017 was different from this pain.  It was a pressure, in her chest and in her back.   Until the last week, she had never had the symptoms before.   In the last couple of years, she has had significant musculoskeletal issues, saying at one point she was unable to walk.  Upon review of the records in [place], I do not find a diagnosis for this.  She has also had right knee pain, and got steroid injections for that.   She has multiple thyroid nodules, evaluated by Dr. Harlow Asa.  Her TSH has been normal and the thyroid nodules have a 96% chance of being benign based on biopsy.  No further work-up is planned.  Hospital Course     Consultants: N/A   Patient was admitted to cardiology service.  Serial high-sensitivity troponin was 52-->68.  Patient was subsequently taken to the Cath Lab on 1/4.  Cardiac catheterization revealed a 95% mid RCA lesion that was heavily calcified and diffusely diseased, this was treated with a Synergy 3.5 x 24 mm DES postdilated to 3.75 mm in diameter, there were at least 2 spots within the stented segment that was not fully expanded and had 35% stenosis, therefore the patient was consider having higher than normal risk for restenosis.  EF was 55%.  Postprocedure, he was placed on aspirin and Brilinta with recommendation to continue dual  antiplatelet therapy for at least 1 year.  Echocardiogram obtained on the same day showed EF 60 to 65%, mild LVH, grade 1 DD, trivial MR, dilated aortic root at 37 mm.   Patient was seen in the morning of 06/12/2021 at which time she was doing well without any significant chest pain or worsening dyspnea.  She was prescribed a dose of potassium chloride 40 mEq due to low potassium level 3.3.  Due to history of statin intolerance, she will be referred to the lipid clinic to consider Repatha or Praluent.  For the time being, she will continue on Zetia. Brilinta cost $37 per month. Emphasis has been placed on compliance with DAPT.    Did the patient have an acute coronary syndrome (MI, NSTEMI, STEMI, etc) this admission?:  Yes  AHA/ACC Clinical Performance & Quality Measures: Aspirin prescribed? - Yes ADP Receptor Inhibitor (Plavix/Clopidogrel, Brilinta/Ticagrelor or Effient/Prasugrel) prescribed (includes medically managed patients)? - Yes Beta Blocker prescribed? - Yes High Intensity Statin (Lipitor 40-80mg  or Crestor 20-40mg ) prescribed? - Yes EF assessed during THIS hospitalization? - Yes For EF <40%, was ACEI/ARB prescribed? - Not Applicable (EF >/= 25%) For EF <40%, Aldosterone Antagonist (Spironolactone or Eplerenone) prescribed? - Not Applicable (EF >/= 85%) Cardiac Rehab Phase II ordered (including medically managed patients)? - Yes       The patient will be scheduled for a TOC follow up appointment in 14 days.  A message has been sent to the [facility] and Scheduling Pool at the office where the patient should be seen for follow up.  _____________  Discharge Vitals Blood pressure 135/79, pulse 95, temperature 98.2 F (36.8 C), resp. rate 20, height 5\' 2"  (1.575 m), weight 87.3 kg, SpO2 92 %.  Filed Weights   06/09/21 0623 06/10/21 0450 06/12/21 0448  Weight: 89.6 kg 88.8 kg 87.3 kg    Labs & Radiologic Studies    CBC Recent Labs    06/10/21 0439  06/12/21 0059  WBC 8.0 10.6*  HGB 11.9* 12.3  HCT 35.9* 36.5  MCV 92.5 90.1  PLT 246 277   Basic Metabolic Panel Recent Labs    06/10/21 0439 06/12/21 0059  NA 138 135  K 3.6 3.3*  CL 104 104  CO2 27 24  GLUCOSE 104* 111*  BUN 10 10  CREATININE 1.02* 1.02*  CALCIUM 9.4 9.4   Liver Function Tests Recent Labs    06/10/21 0439  AST 13*  ALT 18  ALKPHOS 80  BILITOT 0.6  PROT 6.5  ALBUMIN 3.2*   No results for input(s): LIPASE, AMYLASE in the last 72 hours. High Sensitivity Troponin:   Recent Labs  Lab 06/09/21 0115 06/09/21 0318  TROPONINIHS 52* 68*    BNP Invalid input(s): POCBNP D-Dimer No results for input(s): DDIMER in the last 72 hours. Hemoglobin A1C No results for input(s): HGBA1C in the last 72 hours.  Fasting Lipid Panel Recent Labs    06/10/21 0439  CHOL 150  HDL 47  LDLCALC 89  TRIG 69  CHOLHDL 3.2   Thyroid Function Tests No results for input(s): TSH, T4TOTAL, T3FREE, THYROIDAB in the last 72 hours.  Invalid input(s): FREET3  _____________  CARDIAC CATHETERIZATION  Result Date: 06/10/2021 Conclusions: De novo 95% mid RCA stenosis with an a heavily calcified and diffusely diseased segment.  The previously placed distal stent is widely patent Successful drug-eluting stent using a 24 x 3.5 mm Synergy postdilated to 3.75 mm in diameter with TIMI grade III flow but residual underexpansion in the very proximal portion of the stent and in the mid vessel with each area being approximately 35% in stenosis. Left main is widely patent LAD contains a mid vessel 50 to 60% stenosis unchanged from prior study. Luminal irregularities are noted in the circumflex LV function is normal with EF 55% RECOMMENDATIONS: The patient received excess anticoagulation in the Cath Lab (11,000 units of IV heparin) and an IV bivalirudin infusion during PCI.  This led to excessive ACT's greater than 600 seconds.  In reconciling the case and additional 6000 units of heparin was  administered by mistake and without indication.  Plan is to pull she is much later than would ordinarily occur to avoid the risk of bleeding. Aspirin and Brilinta x1 year. Higher than normal risk for restenosis due to diffuse calcification and  at least 2 spots within the stented segment that are not fully expanded although not severely. If all goes well, the patient will be able to discharge in a.m.   DG Chest [facility] 1 View  Result Date: 06/09/2021 CLINICAL DATA:  Initial evaluation for acute intermittent chest pain. EXAM: PORTABLE CHEST 1 VIEW COMPARISON:  Radiograph from 02/22/2019. FINDINGS: Cardiomegaly, stable. Mediastinal silhouette within normal limits. Left-sided Port-A-Cath in place with tip overlying the distal SVC. Lungs normally inflated. Mild perihilar vascular congestion without overt pulmonary edema. No visible pleural effusion. No focal infiltrates. No pneumothorax. No acute osseous finding. IMPRESSION: 1. Cardiomegaly with mild perihilar vascular congestion without overt pulmonary edema. 2. No other active cardiopulmonary disease. Electronically Signed   By: Jeannine Boga M.D.   On: 06/09/2021 02:19   ECHOCARDIOGRAM COMPLETE  Result Date: 06/11/2021    ECHOCARDIOGRAM REPORT   Patient Name:   [facility] [place] Date of Exam: 06/11/2021 Medical Rec #:  [id number]                Height:       62.0 in Accession #:    [id number]               Weight:       195.8 lb Date of Birth:  Oct 22, 1945                BSA:          1.895 m Patient Age:    76 years                 BP:           135/61 mmHg Patient Gender: F                        HR:           65 bpm. Exam Location:  Inpatient Procedure: 2D Echo, Cardiac Doppler, Color Doppler and Intracardiac            Opacification Agent Indications:    R07.9* Chest pain, unspecified  History:        Patient has no prior history of Echocardiogram examinations. CAD                 and Previous Myocardial Infarction; Risk Factors:Hypertension.   Sonographer:    Glo Herring Referring Phys: [place] Comments: Technically difficult study due to poor echo windows. IMPRESSIONS  1. Difficult study due to poor echo windows.  2. Left ventricular ejection fraction, by estimation, is 60 to 65%. The left ventricle has normal function. The left ventricle has no regional wall motion abnormalities. There is mild left ventricular hypertrophy. Left ventricular diastolic parameters are consistent with Grade I diastolic dysfunction (impaired relaxation).  3. Right ventricular systolic function is normal. The right ventricular size is normal.  4. The mitral valve is grossly normal. Trivial mitral valve regurgitation.  5. The aortic valve is tricuspid. There is mild thickening of the aortic valve. Aortic valve regurgitation is not visualized. Aortic valve sclerosis is present, with no evidence of aortic valve stenosis.  6. Aortic dilatation noted. There is borderline dilatation of the aortic root, measuring 37 mm. Comparison(s): No prior Echocardiogram. FINDINGS  Left Ventricle: Left ventricular ejection fraction, by estimation, is 60 to 65%. The left ventricle has normal function. The left ventricle has no regional wall motion abnormalities. Definity contrast agent was given IV to delineate the left ventricular  endocardial borders. The left ventricular internal cavity size was normal in size. There is mild left ventricular hypertrophy. Left ventricular diastolic parameters are consistent with Grade I diastolic dysfunction (impaired relaxation). Right Ventricle: The right ventricular size is normal. Right vetricular wall thickness was not well visualized. Right ventricular systolic function is normal. Left Atrium: Left atrial size was not well visualized. Right Atrium: Right atrial size was not well visualized. Pericardium: There is no evidence of pericardial effusion. Mitral Valve: The mitral valve is grossly normal. Trivial mitral valve  regurgitation. Tricuspid Valve: The tricuspid valve is not well visualized. Tricuspid valve regurgitation is not demonstrated. Aortic Valve: The aortic valve is tricuspid. There is mild thickening of the aortic valve. Aortic valve regurgitation is not visualized. Aortic valve sclerosis is present, with no evidence of aortic valve stenosis. Aortic valve mean gradient measures 4.0  mmHg. Aortic valve peak gradient measures 6.7 mmHg. Aortic valve area, by VTI measures 1.97 cm. Pulmonic Valve: The pulmonic valve was not well visualized. Pulmonic valve regurgitation is not visualized. Aorta: Aortic dilatation noted. There is borderline dilatation of the aortic root, measuring 37 mm. Venous: The inferior vena cava was not well visualized. IAS/Shunts: The atrial septum is grossly normal.  LEFT VENTRICLE PLAX 2D LVIDd:         3.80 cm   Diastology LVIDs:         2.60 cm   LV e' medial:    6.09 cm/s LV PW:         1.10 cm   LV E/e' medial:  12.6 LV IVS:        1.10 cm   LV e' lateral:   7.72 cm/s LVOT diam:     1.90 cm   LV E/e' lateral: 9.9 LV SV:         58 LV SV Index:   31 LVOT Area:     2.84 cm  RIGHT VENTRICLE             IVC RV S prime:     13.20 cm/s  IVC diam: 2.00 cm LEFT ATRIUM           Index LA diam:      3.20 cm 1.69 cm/m LA Vol (A4C): 47.8 ml 25.23 ml/m  AORTIC VALVE                    PULMONIC VALVE AV Area (Vmax):    1.80 cm     PV Vmax:       1.07 m/s AV Area (Vmean):   1.92 cm     PV Peak grad:  4.6 mmHg AV Area (VTI):     1.97 cm AV Vmax:           129.00 cm/s AV Vmean:          87.700 cm/s AV VTI:            0.295 m AV Peak Grad:      6.7 mmHg AV Mean Grad:      4.0 mmHg LVOT Vmax:         81.70 cm/s LVOT Vmean:        59.500 cm/s LVOT VTI:          0.205 m LVOT/AV VTI ratio: 0.69  AORTA Ao Root diam: 3.10 cm Ao Asc diam:  3.30 cm MITRAL VALVE MV Area (PHT): 2.79 cm     SHUNTS MV Decel Time: 272 msec     Systemic VTI:  0.20  m MV E velocity: 76.60 cm/s   Systemic Diam: 1.90 cm MV A velocity:  103.00 cm/s MV E/A ratio:  0.74 Gwyndolyn Kaufman MD Electronically signed by Gwyndolyn Kaufman MD Signature Date/Time: 06/11/2021/12:04:45 PM    Final    Disposition   Pt is being discharged home today in good condition.  Follow-up Plans & Appointments     Follow-up Information     Almyra Deforest, [place] Follow up on 06/24/2021.   Specialties: Cardiology, Radiology Why: 11:45AM. Cardiology follow up Contact information: [address] [place] [address] [phone]                Discharge Instructions     AMB Referral to [facility] Pharm[facility]   Complete by: As directed    Reason For Referral: Lipids   Amb Referral to Cardiac Rehabilitation   Complete by: As directed    Diagnosis:  Coronary Stents NSTEMI     After initial evaluation and assessments completed: Virtual Based Care may be provided alone or in conjunction with Phase 2 Cardiac Rehab based on patient barriers.: Yes   Diet - low sodium heart healthy   Complete by: As directed    Discharge instructions   Complete by: As directed    No lifting over 5 lbs for 1 week. No sexual activity for 1 week. Keep procedure site clean & dry. If you notice increased pain, swelling, bleeding or pus, call/return!  You may shower, but no soaking baths/hot tubs/pools for 1 week.   Increase activity slowly   Complete by: As directed        Discharge Medications   Allergies as of 06/12/2021       Reactions   Guaifenesin Er Palpitations   Lotensin [benazepril Hcl] Palpitations   Propoxyphene Palpitations        Medication List     TAKE these medications    acetaminophen 650 MG CR tablet Commonly known as: TYLENOL Take 650 mg by mouth every 8 (eight) hours as needed for pain.   amLODipine 10 MG tablet Commonly known as: NORVASC Take 1 tablet (10 mg total) by mouth daily. What changed:  medication strength how much to take   ASPIRIN 81 PO Take 81 mg by mouth daily.   ezetimibe 10 MG tablet Commonly  known as: ZETIA Take 1 tablet by mouth once daily   loperamide 2 MG capsule Commonly known as: IMODIUM Take 2-4 mg by mouth 4 (four) times daily as needed for diarrhea or loose stools.   metoprolol succinate 25 MG 24 hr tablet Commonly known as: TOPROL-XL Take 0.5 tablets (12.5 mg total) by mouth daily.   nitroGLYCERIN 0.4 MG SL tablet Commonly known as: NITROSTAT Place 1 tablet (0.4 mg total) under the tongue every 5 (five) minutes x 3 doses as needed for chest pain.   nystatin cream Commonly known as: MYCOSTATIN Apply 1 application topically 2 (two) times daily as needed (rash).   omeprazole 40 MG capsule Commonly known as: PRILOSEC Take 40 mg by mouth daily.   ticagrelor 90 MG Tabs tablet Commonly known as: BRILINTA Take 1 tablet (90 mg total) by mouth 2 (two) times daily.   valsartan 160 MG tablet Commonly known as: DIOVAN Take 160 mg by mouth daily.   vitamin B-12 1000 MCG tablet Commonly known as: CYANOCOBALAMIN Take 1,000 mcg by mouth daily.   VITAMIN D PO Take 1 tablet by mouth daily.   VITAMIN E PO Take 1 tablet by mouth daily.  Outstanding Labs/Studies   N/A  Duration of Discharge Encounter   Greater than 30 minutes including physician time.  Hilbert Corrigan, PA 06/12/2021, 9:34 PM

## 2021-06-13 ENCOUNTER — Other Ambulatory Visit: Payer: Self-pay

## 2021-06-13 ENCOUNTER — Inpatient Hospital Stay: Payer: Medicare Other | Attending: Oncology

## 2021-06-13 VITALS — BP 138/77 | HR 89 | Temp 97.6°F | Resp 18

## 2021-06-13 DIAGNOSIS — Z85038 Personal history of other malignant neoplasm of large intestine: Secondary | ICD-10-CM | POA: Diagnosis present

## 2021-06-13 DIAGNOSIS — Z452 Encounter for adjustment and management of vascular access device: Secondary | ICD-10-CM | POA: Diagnosis present

## 2021-06-13 DIAGNOSIS — Z95828 Presence of other vascular implants and grafts: Secondary | ICD-10-CM

## 2021-06-13 DIAGNOSIS — C182 Malignant neoplasm of ascending colon: Secondary | ICD-10-CM

## 2021-06-13 DIAGNOSIS — E538 Deficiency of other specified B group vitamins: Secondary | ICD-10-CM

## 2021-06-13 MED ORDER — HEPARIN SOD (PORK) LOCK FLUSH 100 UNIT/ML IV SOLN
500.0000 [IU] | Freq: Once | INTRAVENOUS | Status: AC
  Administered 2021-06-13: 500 [IU]

## 2021-06-13 MED ORDER — SODIUM CHLORIDE 0.9% FLUSH
10.0000 mL | Freq: Once | INTRAVENOUS | Status: AC
  Administered 2021-06-13: 10 mL

## 2021-06-13 MED ORDER — CYANOCOBALAMIN 1000 MCG/ML IJ SOLN: 1000.0000 ug | Freq: Once | INTRAMUSCULAR | Status: DC

## 2021-06-13 NOTE — Telephone Encounter (Signed)
Patient contacted regarding discharge from [facility] on 06/12/21.  Patient understands to follow up with provider Almyra Deforest PA on 06/24/21 at 11:20am at [facility] Patient understands discharge instructions? YES Patient understands medications and regiment? YES Patient understands to bring all medications to this visit? YES

## 2021-06-13 NOTE — Patient Instructions (Signed)

## 2021-06-24 ENCOUNTER — Other Ambulatory Visit: Payer: Self-pay

## 2021-06-24 ENCOUNTER — Ambulatory Visit: Payer: Medicare Other | Admitting: Physician Assistant

## 2021-06-24 ENCOUNTER — Encounter: Payer: Self-pay | Admitting: Physician Assistant

## 2021-06-24 VITALS — BP 122/64 | HR 95 | Ht 62.0 in | Wt 193.8 lb

## 2021-06-24 DIAGNOSIS — I2510 Atherosclerotic heart disease of native coronary artery without angina pectoris: Secondary | ICD-10-CM

## 2021-06-24 DIAGNOSIS — E785 Hyperlipidemia, unspecified: Secondary | ICD-10-CM

## 2021-06-24 DIAGNOSIS — Z8673 Personal history of transient ischemic attack (TIA), and cerebral infarction without residual deficits: Secondary | ICD-10-CM | POA: Diagnosis not present

## 2021-06-24 DIAGNOSIS — I10 Essential (primary) hypertension: Secondary | ICD-10-CM | POA: Diagnosis not present

## 2021-06-24 NOTE — Patient Instructions (Signed)
Medication Instructions:  Your physician recommends that you continue on your current medications as directed. Please refer to the Current Medication list given to you today.  *If you need a refill on your cardiac medications before your next appointment, please call your pharmacy*  Lab Work: NONE ordered at this time of appointment   If you have labs (blood work) drawn today and your tests are completely normal, you will receive your results only by: [place] (if you have MyChart) OR A paper copy in the mail If you have any lab test that is abnormal or we need to change your treatment, we will call you to review the results.  Testing/Procedures: NONE ordered at this time of appointment   Follow-Up: At [facility], you and your health needs are our priority.  As part of our continuing mission to provide you with exceptional heart care, we have created designated Provider Care Teams.  These Care Teams include your primary Cardiologist (physician) and Advanced Practice Providers (APPs -  Physician Assistants and Nurse Practitioners) who all work together to provide you with the care you need, when you need it.  We recommend signing up for the patient portal called "MyChart".  Sign up information is provided on this After Visit Summary.  MyChart is used to connect with patients for Virtual Visits (Telemedicine).  Patients are able to view lab/test results, encounter notes, upcoming appointments, etc.  Non-urgent messages can be sent to your provider as well.   To learn more about what you can do with MyChart, go to [email or web address].    Your next appointment:   3 month(s)  The format for your next appointment:   In Person  Provider:   Peter Martinique, MD    Other Instructions

## 2021-06-24 NOTE — Progress Notes (Signed)
Cardiology Office Note:    Date:  06/26/2021   ID:  Desmond Dike Murchis Yisel, Megill [birth year], MRN [id number]  PCP:  Chesley Noon, MD   [facility] Providers Cardiologist:  Peter Martinique, MD     Referring MD: Chesley Noon, MD   Chief Complaint  Patient presents with   Follow-up    Seen for Dr. Martinique    History of Present Illness:    [name] is a 76 y.o. female with a hx of CAD, hypertension, hyperlipidemia, obesity, colon cancer s/p right hemicolectomy, and CVA.  Patient was recently admitted to the hospital on 06/09/2021 with chest discomfort.  She initially went to the [facility] ED, blood work showed borderline elevated troponin.  She was taken to the Cath Lab on 06/11/2021.  Cardiac catheterization revealed a 95% mid RCA lesion that was heavily calcified and diffusely diseased, this was treated with a Synergy 3.5 x 24 mm DES postdilated to 3.75 mm in diameter.  There were at least 2 spots within the stented segment that was not fully expanded and had a 35% stenosis, therefore the patient was consider having higher than normal risk for restenosis.  EF was 55%.  Postprocedure, she was placed on aspirin to Brilinta with recommendation to continue dual antiplatelet therapy for at least 1 year.  Echocardiogram showed EF 60 to 65%, mild LVH, grade 1 DD, trivial MR, dilated aortic root at 37 mm.  Patient presents today for follow-up.  She denies any further chest discomfort.  She has been compliant with dual antiplatelet therapy.  She has no problem affording the Brilinta with the current insurance.  Overall, patient has been doing quite well from the cardiac perspective and can follow-up in 3 months.   Past Medical History:  Diagnosis Date   Cancer of right colon ([place])    Constipation    CVA (cerebral infarction) 1996   Dyspnea    going up stairs and hills   Family history of colon cancer    GERD (gastroesophageal reflux disease)    History of cardiomegaly     Hyperlipidemia    Hypertension    Iron deficiency anemia    NSTEMI (non-ST elevated myocardial infarction) ([place]) 2017   Personal history of colonic polyps    PONV (postoperative nausea and vomiting)    Right shoulder pain    more painful at night , cold makes it worse   Severe obesity ([place])     Past Surgical History:  Procedure Laterality Date   ABDOMINAL HYSTERECTOMY     BACK SURGERY     x2   CARDIAC CATHETERIZATION N/A 02/28/2016   Procedure: Left Heart Cath and Coronary Angiography;  Surgeon: Peter M Martinique, MD;  Location: [place] [facility];  Service: Cardiovascular;  Laterality: N/A;   CARDIAC CATHETERIZATION N/A 02/28/2016   Procedure: Coronary Stent Intervention;  Surgeon: Peter M Martinique, MD;  Location: [facility];  Service: Cardiovascular;  Laterality: N/A;   CHOLECYSTECTOMY     COLONOSCOPY  11/24/2018   CORONARY ANGIOPLASTY     CORONARY STENT INTERVENTION N/A 06/10/2021   Procedure: CORONARY STENT INTERVENTION;  Surgeon: Belva Crome, MD;  Location: [place] [facility];  Service: Cardiovascular;  Laterality: N/A;   CYST EXCISION Left    Cheek   LAPAROSCOPIC RIGHT HEMI COLECTOMY Right 01/17/2019   Procedure: LAPAROSCOPIC RIGHT HEMI COLECTOMY;  Surgeon: Alphonsa Overall, MD;  Location: [facility];  Service: General;  Laterality: Right;   LEFT HEART CATH AND CORONARY  ANGIOGRAPHY N/A 06/10/2021   Procedure: LEFT HEART CATH AND CORONARY ANGIOGRAPHY;  Surgeon: Belva Crome, MD;  Location: [place] [facility];  Service: Cardiovascular;  Laterality: N/A;   NASAL FRACTURE SURGERY     PORTACATH PLACEMENT N/A 02/22/2019   Procedure: POWER PORT PLACEMENT;  Surgeon: Alphonsa Overall, MD;  Location: [place];  Service: General;  Laterality: N/A;   UPPER GI ENDOSCOPY  11/24/2018    Current Medications: Current Meds  Medication Sig   acetaminophen (TYLENOL) 650 MG CR tablet Take 650 mg by mouth every 8 (eight) hours as needed for pain.   amLODipine (NORVASC) 10 MG tablet Take 1 tablet (10 mg  total) by mouth daily.   ASPIRIN 81 PO Take 81 mg by mouth daily.   ezetimibe (ZETIA) 10 MG tablet Take 1 tablet by mouth once daily (Patient taking differently: Take 10 mg by mouth daily.)   loperamide (IMODIUM) 2 MG capsule Take 2-4 mg by mouth 4 (four) times daily as needed for diarrhea or loose stools.   metoprolol succinate (TOPROL-XL) 25 MG 24 hr tablet Take 0.5 tablets (12.5 mg total) by mouth daily.   nitroGLYCERIN (NITROSTAT) 0.4 MG SL tablet Place 1 tablet (0.4 mg total) under the tongue every 5 (five) minutes x 3 doses as needed for chest pain.   nystatin cream (MYCOSTATIN) Apply 1 application topically 2 (two) times daily as needed (rash).   omeprazole (PRILOSEC) 40 MG capsule Take 40 mg by mouth daily.   ticagrelor (BRILINTA) 90 MG TABS tablet Take 1 tablet (90 mg total) by mouth 2 (two) times daily.   valsartan (DIOVAN) 160 MG tablet Take 160 mg by mouth daily.   vitamin B-12 (CYANOCOBALAMIN) 1000 MCG tablet Take 1,000 mcg by mouth daily.   VITAMIN D PO Take 1 tablet by mouth daily.   VITAMIN E PO Take 1 tablet by mouth daily.     Allergies:   Guaifenesin er, Lotensin [benazepril hcl], and Propoxyphene   Social History   Socioeconomic History   Marital status: Married    Spouse name: Not on file   Number of children: Not on file   Years of education: Not on file   Highest education level: Not on file  Occupational History   Not on file  Tobacco Use   Smoking status: Never   Smokeless tobacco: Never  Vaping Use   Vaping Use: Never used  Substance and Sexual Activity   Alcohol use: No   Drug use: No   Sexual activity: Not on file  Other Topics Concern   Not on file  Social History Narrative   Not on file   Social Determinants of Health   Financial Resource Strain: Not on file  Food Insecurity: Not on file  Transportation Needs: Not on file  Physical Activity: Not on file  Stress: Not on file  Social Connections: Not on file     Family History: The  patient's family history includes Breast cancer in her cousin and maternal aunt; CAD in her daughter; Colon cancer (age of onset: 42) in her sister; Colon polyps in her sister; Esophageal cancer (age of onset: 74) in her sister; Lung cancer (age of onset: 5) in her brother.  ROS:   Please see the history of present illness.     All other systems reviewed and are negative.  EKGs/Labs/Other Studies Reviewed:    The following studies were reviewed today:  Echo 06/11/2021 1. Difficult study due to poor echo windows.   2. Left  ventricular ejection fraction, by estimation, is 60 to 65%. The  left ventricle has normal function. The left ventricle has no regional  wall motion abnormalities. There is mild left ventricular hypertrophy.  Left ventricular diastolic parameters  are consistent with Grade I diastolic dysfunction (impaired relaxation).   3. Right ventricular systolic function is normal. The right ventricular  size is normal.   4. The mitral valve is grossly normal. Trivial mitral valve  regurgitation.   5. The aortic valve is tricuspid. There is mild thickening of the aortic  valve. Aortic valve regurgitation is not visualized. Aortic valve  sclerosis is present, with no evidence of aortic valve stenosis.   6. Aortic dilatation noted. 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   Comparison(s): No prior Echocardiogram.   EKG:  EKG is ordered today.  The ekg ordered today demonstrates normal sinus rhythm, Q waves in the inferior lead and poor R wave progression in the anterior leads.  Recent Labs: 06/09/2021: TSH 3.281 06/10/2021: ALT 18 06/12/2021: BUN 10; Creatinine, Ser 1.02; Hemoglobin 12.3; Platelets 246; Potassium 3.3; Sodium 135  Recent Lipid Panel    Component Value Date/Time   CHOL 150 06/10/2021 0439   TRIG 69 06/10/2021 0439   HDL 47 06/10/2021 0439   CHOLHDL 3.2 06/10/2021 0439   VLDL 14 06/10/2021 0439   LDLCALC 89 06/10/2021 0439     Risk  Assessment/Calculations:           Physical Exam:    VS:  BP 122/64    Pulse 95    Ht 5\' 2"  (1.575 m)    Wt 193 lb 12.8 oz (87.9 kg)    SpO2 100%    BMI 35.45 kg/m     Wt Readings from Last 3 Encounters:  06/24/21 193 lb 12.8 oz (87.9 kg)  06/12/21 192 lb 7.4 oz (87.3 kg)  04/18/21 195 lb (88.5 kg)     GEN:  Well nourished, well developed in no acute distress HEENT: Normal NECK: No JVD; No carotid bruits LYMPHATICS: No lymphadenopathy CARDIAC: RRR, no murmurs, rubs, gallops RESPIRATORY:  Clear to auscultation without rales, wheezing or rhonchi  ABDOMEN: Soft, non-tender, non-distended MUSCULOSKELETAL:  No edema; No deformity  SKIN: Warm and dry NEUROLOGIC:  Alert and oriented x 3 PSYCHIATRIC:  Normal affect   ASSESSMENT:    1. Coronary artery disease involving native coronary artery of native heart without angina pectoris   2. Essential hypertension   3. Hyperlipidemia LDL goal <70   4. H/O: CVA (cerebrovascular accident)    PLAN:    In order of problems listed above:  CAD: Recently underwent PCI to RCA.  Continue aspirin and Brilinta.  Emphasis has been placed on compliance with dual antiplatelet therapy  Hypertension: Blood pressure stable on current therapy  Hyperlipidemia: Continue Zetia.  We discussed the importance of controlling LDL and I recommended refer her to lipid clinic to consider PCSK9 inhibitor.  However patient was quite hesitant to start her on another medication and wished to discuss with MD on the next visit  History of CVA: No recent recurrence.    Cardiac Rehabilitation Eligibility Assessment            Medication Adjustments/Labs and Tests Ordered: Current medicines are reviewed at length with the patient today.  Concerns regarding medicines are outlined above.  Orders Placed This Encounter  Procedures   EKG 12-Lead   No orders of the defined types were placed in this encounter.  Patient Instructions  Medication Instructions:   Your physician recommends that you continue on your current medications as directed. Please refer to the Current Medication list given to you today.  *If you need a refill on your cardiac medications before your next appointment, please call your pharmacy*  Lab Work: NONE ordered at this time of appointment   If you have labs (blood work) drawn today and your tests are completely normal, you will receive your results only by: [place] (if you have MyChart) OR A paper copy in the mail If you have any lab test that is abnormal or we need to change your treatment, we will call you to review the results.  Testing/Procedures: NONE ordered at this time of appointment   Follow-Up: At [facility], you and your health needs are our priority.  As part of our continuing mission to provide you with exceptional heart care, we have created designated Provider Care Teams.  These Care Teams include your primary Cardiologist (physician) and Advanced Practice Providers (APPs -  Physician Assistants and Nurse Practitioners) who all work together to provide you with the care you need, when you need it.  We recommend signing up for the patient portal called "MyChart".  Sign up information is provided on this After Visit Summary.  MyChart is used to connect with patients for Virtual Visits (Telemedicine).  Patients are able to view lab/test results, encounter notes, upcoming appointments, etc.  Non-urgent messages can be sent to your provider as well.   To learn more about what you can do with MyChart, go to [email or web address].    Your next appointment:   3 month(s)  The format for your next appointment:   In Person  Provider:   Peter Martinique, MD    Other Instructions     Signed, Almyra Deforest, [place]  06/26/2021 10:56 PM    [place]

## 2021-06-26 ENCOUNTER — Encounter: Payer: Self-pay | Admitting: Physician Assistant

## 2021-08-01 ENCOUNTER — Encounter: Payer: Self-pay | Admitting: Pharmacist

## 2021-08-01 ENCOUNTER — Other Ambulatory Visit: Payer: Self-pay

## 2021-08-01 ENCOUNTER — Ambulatory Visit: Payer: Medicare Other | Admitting: Pharmacist

## 2021-08-01 DIAGNOSIS — M791 Myalgia, unspecified site: Secondary | ICD-10-CM

## 2021-08-01 DIAGNOSIS — Z9861 Coronary angioplasty status: Secondary | ICD-10-CM

## 2021-08-01 DIAGNOSIS — T466X5A Adverse effect of antihyperlipidemic and antiarteriosclerotic drugs, initial encounter: Secondary | ICD-10-CM

## 2021-08-01 DIAGNOSIS — E785 Hyperlipidemia, unspecified: Secondary | ICD-10-CM

## 2021-08-01 DIAGNOSIS — I214 Non-ST elevation (NSTEMI) myocardial infarction: Secondary | ICD-10-CM | POA: Diagnosis not present

## 2021-08-01 DIAGNOSIS — I2510 Atherosclerotic heart disease of native coronary artery without angina pectoris: Secondary | ICD-10-CM

## 2021-08-01 MED ORDER — EZETIMIBE 10 MG PO TABS: 10.0000 mg | ORAL_TABLET | Freq: Every day | ORAL | 0 refills | Status: DC

## 2021-08-01 NOTE — Telephone Encounter (Deleted)
Please complete prior authorization for:  Name of medication, dose, and frequency praluent 75mg  sq q 14 days or repatha 140mg  sq q 14 days  Lab Orders Requested? yes  Which labs? Lipid panel  Estimated date for labs to be scheduled 2-3 months  Does patient need activated copay card? No, but will need [employer] assistance

## 2021-08-01 NOTE — Telephone Encounter (Signed)
This encounter was created in error - please disregard.

## 2021-08-01 NOTE — Patient Instructions (Addendum)
It was nice meeting you today  We would like your LDL (bad cholesterol) to be less than 55  We will start a new medication called either Repatha or Praluent.  You will inject one pen every 2 weeks  We will complete the prior authorization and contact you when it is complete  We will then recheck your cholesterol in 2-3 months  Please call with any questions!  Karren Cobble, PharmD, BCACP, [place], [place], [place], [place], [address] Phone: [phone], Fax: [phone]

## 2021-08-01 NOTE — Progress Notes (Signed)
Patient ID: Fernando Torry                 DOB: [birth year]                    MRN: [id number]     HPI: Tazaria Dlugosz is a 76 y.o. female patient referred to lipid clinic by Almyra Deforest. PMH is significant for CAD, multiple MI, HTN, obesity, HLD, CVA and statin intolerance.  Patient also has history of colon cancer treated with chemotherapy.  Patient had NSTEMI and cath on 06/09/21 and then had Covid at the end of January.  Walks very slowly with a walker and reports she is extremely weak and tired. Falls asleep quickly while reading or watching tv. Reports is quickly out of breath with exertion. Has a difficult time going from sitting to standing position.  Denies chest pain.  Currently lipids are managed solely on Zetia 10mg . Has tried atorvastatin and rosuvastatin in the past and both caused severe muscle pain.  Was seen in lipid clinic ~ 3 years ago and declined PCSK9i due to not wanting to give herself injections. Now is willing.  Current Medications:  Zetia 10mg  daily  Intolerances:  Atorvastatin Rosuvatstain  Risk Factors:  CAD Hx of NSTEMI Hx of CVA HLD  LDL goal: <55  Labs: TC 150, Trigs 69, HDL 47, LDL 89 (06/10/21 - on Zetia)  Past Medical History:  Diagnosis Date   Cancer of right colon ([place])    Constipation    CVA (cerebral infarction) 1996   Dyspnea    going up stairs and hills   Family history of colon cancer    GERD (gastroesophageal reflux disease)    History of cardiomegaly    Hyperlipidemia    Hypertension    Iron deficiency anemia    NSTEMI (non-ST elevated myocardial infarction) ([place]) 2017   Personal history of colonic polyps    PONV (postoperative nausea and vomiting)    Right shoulder pain    more painful at night , cold makes it worse   Severe obesity ([place])     Current Outpatient Medications on File Prior to Visit  Medication Sig Dispense Refill   acetaminophen (TYLENOL) 650 MG CR tablet Take 650 mg by mouth every 8 (eight) hours  as needed for pain.     amLODipine (NORVASC) 10 MG tablet Take 1 tablet (10 mg total) by mouth daily. 90 tablet 2   ASPIRIN 81 PO Take 81 mg by mouth daily.     ezetimibe (ZETIA) 10 MG tablet Take 1 tablet by mouth once daily (Patient taking differently: Take 10 mg by mouth daily.) 90 tablet 0   loperamide (IMODIUM) 2 MG capsule Take 2-4 mg by mouth 4 (four) times daily as needed for diarrhea or loose stools.     metoprolol succinate (TOPROL-XL) 25 MG 24 hr tablet Take 0.5 tablets (12.5 mg total) by mouth daily. 30 tablet 3   nitroGLYCERIN (NITROSTAT) 0.4 MG SL tablet Place 1 tablet (0.4 mg total) under the tongue every 5 (five) minutes x 3 doses as needed for chest pain. 25 tablet 3   nystatin cream (MYCOSTATIN) Apply 1 application topically 2 (two) times daily as needed (rash).     omeprazole (PRILOSEC) 40 MG capsule Take 40 mg by mouth daily.     ticagrelor (BRILINTA) 90 MG TABS tablet Take 1 tablet (90 mg total) by mouth 2 (two) times daily. 60 tablet 11   valsartan (DIOVAN) 160 MG tablet  Take 160 mg by mouth daily.     vitamin B-12 (CYANOCOBALAMIN) 1000 MCG tablet Take 1,000 mcg by mouth daily.     VITAMIN D PO Take 1 tablet by mouth daily.     VITAMIN E PO Take 1 tablet by mouth daily.     Current Facility-Administered Medications on File Prior to Visit  Medication Dose Route Frequency Provider Last Rate Last Admin   heparin lock flush 100 unit/mL  250 Units Intracatheter Once Ladell Pier, MD        Allergies  Allergen Reactions   Guaifenesin Er Palpitations   Lotensin [Benazepril Hcl] Palpitations   Propoxyphene Palpitations    Assessment/Plan:  1. Hyperlipidemia - Patient LDL 89 on Zetia 10mg  which is above goal of <55.  Aggressive goal selected due to patient's CAD, hx of MI, and hx of CVA.  Unfortunately intolerant to statins and previously resistant to PCSK9i.  Now is more receptive.  Using [employer], educated patient on mechanism of action, storage, site  selection, administration, and possible adverse effects.  Patient was able to demonstrate in room.  Will complete PA and contact patient when approved.  Will likely need grant funding help. Repeat lipid panel in 2-3 months. May be able to d/c Zetia at that time.  Continue Zetia 10mg  daily Start Praluent/Repatha SQ q 14 days Check lipid panel in 2-3 months  Karren Cobble, PharmD, [place], [place], [place], [address] [place], [place], [address] Phone: [phone], Fax: [phone]

## 2021-08-04 ENCOUNTER — Telehealth: Payer: Self-pay

## 2021-08-04 DIAGNOSIS — E785 Hyperlipidemia, unspecified: Secondary | ICD-10-CM

## 2021-08-04 NOTE — Telephone Encounter (Signed)
-----   Message from Rollen Sox, [facility] sent at 08/01/2021  2:44 PM EST ----- Please complete prior authorization for:  Name of medication, dose, and frequency praluent 75 mg sq q 14 days or Repatha 140mg  sq q 14 days  Lab Orders Requested? yes  Which labs? Lipid panel  Estimated date for labs to be scheduled 2-3 months  Does patient need activated copay card? No, but will need [employer]

## 2021-08-04 NOTE — Telephone Encounter (Signed)
Nil Kihn (Key: [id number]) Repatha SureClick 140MG /ML auto-injectors   Form [employer] [place] Medicare Part D Electronic Request Form (CB)  LIPID PANEL ORDERED AND RELEASED.  [employer] GRANT IS AS FOLLOWS: PATIENT [name]   STATUS  Active   START DATE 07/05/2021   END DATE 07/04/2022   ASSISTANCE TYPE Co-pay   PAID $0.00   PENDING $0.00   BALANCE $2500.00 Pharmacy Card CARD NO. [id number]   CARD STATUS Active   BIN [id number]   PCN [id number]   PC GROUP [id number]   HELP DESK

## 2021-08-05 MED ORDER — REPATHA SURECLICK 140 MG/ML ~~LOC~~ SOAJ: 140.0000 mg | SUBCUTANEOUS | 11 refills | Status: DC

## 2021-08-05 NOTE — Addendum Note (Signed)
Addended by: Allean Found on: 08/05/2021 03:51 PM   Modules accepted: Orders

## 2021-08-05 NOTE — Telephone Encounter (Signed)
Called and spoke w/pt to tell the repatha was approved and sent in, healthwell grant approved and included on note to pharmacy in rx. Instructed the pt to complete fasting labs post 4th dose and to call should they have issues. Pt voiced understanding.

## 2021-08-08 ENCOUNTER — Other Ambulatory Visit: Payer: Self-pay

## 2021-08-08 ENCOUNTER — Telehealth: Payer: Self-pay

## 2021-08-08 ENCOUNTER — Inpatient Hospital Stay: Payer: Medicare Other | Attending: Oncology

## 2021-08-08 ENCOUNTER — Inpatient Hospital Stay: Payer: Medicare Other

## 2021-08-08 DIAGNOSIS — C182 Malignant neoplasm of ascending colon: Secondary | ICD-10-CM | POA: Diagnosis present

## 2021-08-08 LAB — BASIC METABOLIC PANEL - CANCER CENTER ONLY
Anion gap: 10 (ref 5–15)
BUN: 13 mg/dL (ref 8–23)
CO2: 25 mmol/L (ref 22–32)
Calcium: 9.9 mg/dL (ref 8.9–10.3)
Chloride: 104 mmol/L (ref 98–111)
Creatinine: 1.04 mg/dL — ABNORMAL HIGH (ref 0.44–1.00)
GFR, Estimated: 56 mL/min — ABNORMAL LOW (ref >60)
Glucose, Bld: 114 mg/dL — ABNORMAL HIGH (ref 70–99)
Potassium: 3.4 mmol/L — ABNORMAL LOW (ref 3.5–5.1)
Sodium: 139 mmol/L (ref 135–145)

## 2021-08-08 NOTE — Patient Instructions (Signed)
Heparin injection ?What is this medication? ?HEPARIN (HEP a rin) is an anticoagulant. It is used to treat or prevent clots in the veins, arteries, lungs, or heart. It stops clots from forming or getting bigger. This medicine prevents clotting during open-heart surgery, dialysis, or in patients who are confined to bed. ?This medicine may be used for other purposes; ask your health care provider or pharmacist if you have questions. ?COMMON BRAND NAME(S): Hep-Lock, Hep-Lock U/P, Hepflush-10, Monoject Prefill Advanced Heparin Lock Flush, SASH Normal Saline and Heparin ?What should I tell my care team before I take this medication? ?They need to know if you have any of these conditions: ?bleeding disorders, such as hemophilia or low blood platelets ?bowel disease or diverticulitis ?endocarditis ?high blood pressure ?liver disease ?recent surgery or delivery of a baby ?stomach ulcers ?an unusual or allergic reaction to heparin, benzyl alcohol, sulfites, other medicines, foods, dyes, or preservatives ?pregnant or trying to get pregnant ?breast-feeding ?How should I use this medication? ?This medicine is given by injection or infusion into a vein. It can also be given by injection of small amounts under the skin. It is usually given by a health care professional in a hospital or clinic setting. ?If you get this medicine at home, you will be taught how to prepare and give this medicine. Use exactly as directed. Take your medicine at regular intervals. Do not take it more often than directed. Do not stop taking except on your doctor's advice. Stopping this medicine may increase your risk of a blot clot. Be sure to refill your prescription before you run out of medicine. ?It is important that you put your used needles and syringes in a special sharps container. Do not put them in a trash can. If you do not have a sharps container, call your pharmacist or healthcare provider to get one. ?Talk to your pediatrician regarding the  use of this medicine in children. While this medicine may be prescribed for children for selected conditions, precautions do apply. ?Overdosage: If you think you have taken too much of this medicine contact a poison control center or emergency room at once. ?NOTE: This medicine is only for you. Do not share this medicine with others. ?What if I miss a dose? ?If you miss a dose, take it as soon as you can. If it is almost time for your next dose, take only that dose. Do not take double or extra doses. ?What may interact with this medication? ?Do not take this medicine with any of the following medications: ?aspirin and aspirin-like drugs ?mifepristone ?medicines that treat or prevent blood clots like warfarin, enoxaparin, and dalteparin ?palifermin ?protamine ?This medicine may also interact with the following medications: ?dextran ?digoxin ?hydroxychloroquine ?medicines for treating colds or allergies ?nicotine ?NSAIDs, medicines for pain and inflammation, like ibuprofen or naproxen ?phenylbutazone ?tetracycline antibiotics ?This list may not describe all possible interactions. Give your health care provider a list of all the medicines, herbs, non-prescription drugs, or dietary supplements you use. Also tell them if you smoke, drink alcohol, or use illegal drugs. Some items may interact with your medicine. ?What should I watch for while using this medication? ?Visit your healthcare professional for regular checks on your progress. You may need blood work done while you are taking this medicine. Your condition will be monitored carefully while you are receiving this medicine. It is important not to miss any appointments. ?Wear a medical ID bracelet or chain, and carry a card that describes your disease and details   of your medicine and dosage times. ?Notify your doctor or healthcare professional at once if you have cold, blue hands or feet. ?If you are going to need surgery or other procedure, tell your healthcare  professional that you are using this medicine. ?Avoid sports and activities that might cause injury while you are using this medicine. Severe falls or injuries can cause unseen bleeding. Be careful when using sharp tools or knives. Consider using an Copy. Take special care brushing or flossing your teeth. Report any injuries, bruising, or red spots on the skin to your healthcare professional. ?Using this medicine for a long time may weaken your bones and increase the risk of bone fractures. ?You should make sure that you get enough calcium and vitamin D while you are taking this medicine. Discuss the foods you eat and the vitamins you take with your healthcare professional. ?Wear a medical ID bracelet or chain. Carry a card that describes your disease and details of your medicine and dosage times. ?What side effects may I notice from receiving this medication? ?Side effects that you should report to your doctor or health care professional as soon as possible: ?allergic reactions like skin rash, itching or hives, swelling of the face, lips, or tongue ?bone pain ?fever, chills ?nausea, vomiting ?signs and symptoms of bleeding such as bloody or black, tarry stools; red or dark-brown urine; spitting up blood or brown material that looks like coffee grounds; red spots on the skin; unusual bruising or bleeding from the eye, gums, or nose ?signs and symptoms of a blood clot such as chest pain; shortness of breath; pain, swelling, or warmth in the leg ?signs and symptoms of a stroke such as changes in vision; confusion; trouble speaking or understanding; severe headaches; sudden numbness or weakness of the face, arm or leg; trouble walking; dizziness; loss of coordination ?Side effects that usually do not require medical attention (report to your doctor or health care professional if they continue or are bothersome): ?hair loss ?pain, redness, or irritation at site where injected ?This list may not describe all  possible side effects. Call your doctor for medical advice about side effects. You may report side effects to FDA at 1-800-FDA-1088. ?Where should I keep my medication? ?Keep out of the reach of children. ?Store unopened vials at room temperature between 15 and 30 degrees C (59 and 86 degrees F). Do not freeze. Do not use if solution is discolored or particulate matter is present. Throw away any unused medicine after the expiration date. ?NOTE: This sheet is a summary. It may not cover all possible information. If you have questions about this medicine, talk to your doctor, pharmacist, or health care provider. ?? 2022 Elsevier/Gold Standard (2020-07-04 00:00:00) ? ?

## 2021-08-08 NOTE — Telephone Encounter (Signed)
Pt verbalized understanding.

## 2021-08-08 NOTE — Telephone Encounter (Signed)
-----   Message from Ladell Pier, MD sent at 08/08/2021  1:23 PM EST ----- ?Please call patient, potassium is mildly low, she should follow-up with her primary provider for this, return as scheduled, will need a repeat BMP prior to the CTs in May ? ?

## 2021-09-26 NOTE — Progress Notes (Signed)
?Cardiology Office Note:   ? ?Date:  10/01/2021  ? ?ID:  [name], DOB January 14, 1946, MRN [id number] ? ?PCP:  Chesley Noon, MD ?  ?[facility] Providers ?Cardiologist:  Densil Ottey Martinique, MD    ? ?Referring MD: Chesley Noon, MD  ? ?Chief Complaint  ?Patient presents with  ? Edema  ? Coronary Artery Disease  ? ? ?History of Present Illness:   ? ?[name] is a 76 y.o. female with a hx of CAD, hypertension, hyperlipidemia, obesity, colon cancer s/p right hemicolectomy, and CVA.  Patient was  admitted to the hospital on 06/09/2021 with chest discomfort.  She initially went to the [facility] ED, blood work showed borderline elevated troponin.  She was taken to the Cath Lab on 06/11/2021.  Cardiac catheterization revealed a 95% mid RCA lesion that was heavily calcified and diffusely diseased, this was treated with a Synergy 3.5 x 24 mm DES postdilated to 3.75 mm in diameter.  There were at least 2 spots within the stented segment that was not fully expanded and had a 35% stenosis, therefore the patient was consider having higher than normal risk for restenosis.  EF was 55%.  Postprocedure, she was placed on aspirin to Brilinta with recommendation to continue dual antiplatelet therapy for at least 1 year.  Echocardiogram showed EF 60 to 65%, mild LVH, grade 1 DD, trivial MR, dilated aortic root at 37 mm. ? ?She was subsequently seen in lipid clinic and started on Repatha.  ? ?Patient presents today for follow-up.  She denies any further chest discomfort.  She has been compliant with dual antiplatelet therapy.  She has noted over the past month swelling in her legs. Goes down at night. Rare dyspnea.  ? ? ?Past Medical History:  ?Diagnosis Date  ? Cancer of right colon ([place])   ? Constipation   ? CVA (cerebral infarction) 1996  ? Dyspnea   ? going up stairs and hills  ? Family history of colon cancer   ? GERD (gastroesophageal reflux disease)   ? History of cardiomegaly   ? Hyperlipidemia   ?  Hypertension   ? Iron deficiency anemia   ? NSTEMI (non-ST elevated myocardial infarction) ([place]) 2017  ? Personal history of colonic polyps   ? PONV (postoperative nausea and vomiting)   ? Right shoulder pain   ? more painful at night , cold makes it worse  ? Severe obesity ([place])   ? ? ?Past Surgical History:  ?Procedure Laterality Date  ? ABDOMINAL HYSTERECTOMY    ? BACK SURGERY    ? x2  ? CARDIAC CATHETERIZATION N/A 02/28/2016  ? Procedure: Left Heart Cath and Coronary Angiography;  Surgeon: Taheera Thomann M Martinique, MD;  Location: [facility];  Service: Cardiovascular;  Laterality: N/A;  ? CARDIAC CATHETERIZATION N/A 02/28/2016  ? Procedure: Coronary Stent Intervention;  Surgeon: Kataleah Bejar M Martinique, MD;  Location: [facility];  Service: Cardiovascular;  Laterality: N/A;  ? CHOLECYSTECTOMY    ? COLONOSCOPY  11/24/2018  ? CORONARY ANGIOPLASTY    ? CORONARY STENT INTERVENTION N/A 06/10/2021  ? Procedure: CORONARY STENT INTERVENTION;  Surgeon: Belva Crome, MD;  Location: [facility];  Service: Cardiovascular;  Laterality: N/A;  ? CYST EXCISION Left   ? Cheek  ? LAPAROSCOPIC RIGHT HEMI COLECTOMY Right 01/17/2019  ? Procedure: LAPAROSCOPIC RIGHT HEMI COLECTOMY;  Surgeon: Alphonsa Overall, MD;  Location: [facility];  Service: General;  Laterality: Right;  ? LEFT HEART CATH AND CORONARY  ANGIOGRAPHY N/A 06/10/2021  ? Procedure: LEFT HEART CATH AND CORONARY ANGIOGRAPHY;  Surgeon: Belva Crome, MD;  Location: [facility];  Service: Cardiovascular;  Laterality: N/A;  ? NASAL FRACTURE SURGERY    ? PORTACATH PLACEMENT N/A 02/22/2019  ? Procedure: POWER PORT PLACEMENT;  Surgeon: Alphonsa Overall, MD;  Location: [facility];  Service: General;  Laterality: N/A;  ? UPPER GI ENDOSCOPY  11/24/2018  ? ? ?Current Medications: ?Current Meds  ?Medication Sig  ? acetaminophen (TYLENOL) 650 MG CR tablet Take 650 mg by mouth every 8 (eight) hours as needed for pain.  ? ASPIRIN 81 PO Take 81 mg by mouth daily.  ? Evolocumab (REPATHA SURECLICK)  536 MG/ML SOAJ Inject 140 mg into the skin every 14 (fourteen) days.  ? ezetimibe (ZETIA) 10 MG tablet Take 1 tablet (10 mg total) by mouth daily.  ? loperamide (IMODIUM) 2 MG capsule Take 2-4 mg by mouth 4 (four) times daily as needed for diarrhea or loose stools.  ? metoprolol succinate (TOPROL-XL) 25 MG 24 hr tablet Take 0.5 tablets (12.5 mg total) by mouth daily.  ? nitroGLYCERIN (NITROSTAT) 0.4 MG SL tablet Place 1 tablet (0.4 mg total) under the tongue every 5 (five) minutes x 3 doses as needed for chest pain.  ? nystatin cream (MYCOSTATIN) Apply 1 application topically 2 (two) times daily as needed (rash).  ? omeprazole (PRILOSEC) 40 MG capsule Take 40 mg by mouth daily.  ? ticagrelor (BRILINTA) 90 MG TABS tablet Take 1 tablet (90 mg total) by mouth 2 (two) times daily.  ? valsartan (DIOVAN) 160 MG tablet Take 160 mg by mouth daily.  ? vitamin B-12 (CYANOCOBALAMIN) 1000 MCG tablet Take 1,000 mcg by mouth daily.  ? VITAMIN D PO Take 1 tablet by mouth daily.  ? VITAMIN E PO Take 1 tablet by mouth daily.  ? [DISCONTINUED] amLODipine (NORVASC) 10 MG tablet Take 1 tablet (10 mg total) by mouth daily.  ?  ? ?Allergies:   Guaifenesin er, Lotensin [benazepril hcl], and Propoxyphene  ? ?Social History  ? ?Socioeconomic History  ? Marital status: Married  ?  Spouse name: Not on file  ? Number of children: Not on file  ? Years of education: Not on file  ? Highest education level: Not on file  ?Occupational History  ? Not on file  ?Tobacco Use  ? Smoking status: Never  ? Smokeless tobacco: Never  ?Vaping Use  ? Vaping Use: Never used  ?Substance and Sexual Activity  ? Alcohol use: No  ? Drug use: No  ? Sexual activity: Not on file  ?Other Topics Concern  ? Not on file  ?Social History Narrative  ? Not on file  ? ?Social Determinants of Health  ? ?Financial Resource Strain: Not on file  ?Food Insecurity: Not on file  ?Transportation Needs: Not on file  ?Physical Activity: Not on file  ?Stress: Not on file  ?Social  Connections: Not on file  ?  ? ?Family History: ?The patient's family history includes Breast cancer in her cousin and maternal aunt; CAD in her daughter; Colon cancer (age of onset: 83) in her sister; Colon polyps in her sister; Esophageal cancer (age of onset: 7) in her sister; Lung cancer (age of onset: 38) in her brother. ? ?ROS:   ?Please see the history of present illness.    ? All other systems reviewed and are negative. ? ?EKGs/Labs/Other Studies Reviewed:   ? ?The following studies were reviewed today: ? ?Echo 06/11/2021 ?  1. Difficult study due to poor echo windows.  ? 2. Left ventricular ejection fraction, by estimation, is 60 to 65%. The  ?left ventricle has normal function. The left ventricle has no regional  ?wall motion abnormalities. There is mild left ventricular hypertrophy.  ?Left ventricular diastolic parameters  ?are consistent with Grade I diastolic dysfunction (impaired relaxation).  ? 3. Right ventricular systolic function is normal. The right ventricular  ?size is normal.  ? 4. The mitral valve is grossly normal. Trivial mitral valve  ?regurgitation.  ? 5. The aortic valve is tricuspid. There is mild thickening of the aortic  ?valve. Aortic valve regurgitation is not visualized. Aortic valve  ?sclerosis is present, with no evidence of aortic valve stenosis.  ? 6. Aortic dilatation noted. There is borderline dilatation of the aortic  ?root, measuring 37 mm.  ? ?Comparison(s): No prior Echocardiogram.  ? ?EKG:  EKG is not ordered today.   ? ?Recent Labs: ?06/09/2021: TSH 3.281 ?06/10/2021: ALT 18 ?06/12/2021: Hemoglobin 12.3; Platelets 246 ?08/08/2021: BUN 13; Creatinine 1.04; Potassium 3.4; Sodium 139  ?Recent Lipid Panel ?   ?Component Value Date/Time  ? CHOL 150 06/10/2021 0439  ? TRIG 69 06/10/2021 0439  ? HDL 47 06/10/2021 0439  ? CHOLHDL 3.2 06/10/2021 0439  ? VLDL 14 06/10/2021 0439  ? [place] 89 06/10/2021 0439  ? ? ? ?Risk Assessment/Calculations:   ?  ? ?    ? ?Physical Exam:   ? ?VS:  BP  120/60   Pulse 83   Ht '5\' 2"'$  (1.575 m)   Wt 196 lb 9.6 oz (89.2 kg)   SpO2 98%   BMI 35.96 kg/m?    ? ?Wt Readings from Last 3 Encounters:  ?10/01/21 196 lb 9.6 oz (89.2 kg)  ?08/08/21 194 lb 6 oz (88.2 kg)  ?01/1

## 2021-10-01 ENCOUNTER — Encounter: Payer: Self-pay | Admitting: Cardiology

## 2021-10-01 ENCOUNTER — Ambulatory Visit: Payer: Medicare Other | Admitting: Cardiology

## 2021-10-01 VITALS — BP 120/60 | HR 83 | Ht 62.0 in | Wt 196.6 lb

## 2021-10-01 DIAGNOSIS — I10 Essential (primary) hypertension: Secondary | ICD-10-CM

## 2021-10-01 DIAGNOSIS — E785 Hyperlipidemia, unspecified: Secondary | ICD-10-CM | POA: Diagnosis not present

## 2021-10-01 DIAGNOSIS — I2510 Atherosclerotic heart disease of native coronary artery without angina pectoris: Secondary | ICD-10-CM | POA: Diagnosis not present

## 2021-10-01 MED ORDER — AMLODIPINE BESYLATE 5 MG PO TABS: 5.0000 mg | ORAL_TABLET | Freq: Every day | ORAL | 3 refills | Status: DC

## 2021-10-01 NOTE — Addendum Note (Signed)
Addended by: Kathyrn Lass on: 10/01/2021 11:41 AM ? ? Modules accepted: Orders ? ?

## 2021-10-03 ENCOUNTER — Inpatient Hospital Stay: Payer: Medicare Other | Attending: Oncology

## 2021-10-03 ENCOUNTER — Other Ambulatory Visit: Payer: Self-pay | Admitting: *Deleted

## 2021-10-03 VITALS — BP 128/68 | HR 93 | Temp 98.1°F | Resp 20

## 2021-10-03 DIAGNOSIS — Z452 Encounter for adjustment and management of vascular access device: Secondary | ICD-10-CM | POA: Insufficient documentation

## 2021-10-03 DIAGNOSIS — Z85038 Personal history of other malignant neoplasm of large intestine: Secondary | ICD-10-CM | POA: Diagnosis not present

## 2021-10-03 DIAGNOSIS — Z95828 Presence of other vascular implants and grafts: Secondary | ICD-10-CM

## 2021-10-03 DIAGNOSIS — C182 Malignant neoplasm of ascending colon: Secondary | ICD-10-CM

## 2021-10-03 DIAGNOSIS — E538 Deficiency of other specified B group vitamins: Secondary | ICD-10-CM

## 2021-10-03 MED ORDER — SODIUM CHLORIDE 0.9% FLUSH
10.0000 mL | Freq: Once | INTRAVENOUS | Status: AC
  Administered 2021-10-03: 10 mL

## 2021-10-03 MED ORDER — LIDOCAINE-PRILOCAINE 2.5-2.5 % EX CREA: 1.0000 "application " | TOPICAL_CREAM | CUTANEOUS | 1 refills | Status: DC | PRN

## 2021-10-03 MED ORDER — HEPARIN SOD (PORK) LOCK FLUSH 100 UNIT/ML IV SOLN
500.0000 [IU] | Freq: Once | INTRAVENOUS | Status: AC
  Administered 2021-10-03: 500 [IU]

## 2021-10-03 NOTE — Patient Instructions (Signed)

## 2021-10-06 ENCOUNTER — Encounter (HOSPITAL_COMMUNITY): Payer: Self-pay

## 2021-10-06 NOTE — Progress Notes (Signed)
Lidocaine/Prilocaine 2.5% cream approved through [facility] from 10/06/21-10/07/22. ?

## 2021-10-14 ENCOUNTER — Inpatient Hospital Stay: Payer: Medicare Other | Attending: Oncology

## 2021-10-14 ENCOUNTER — Inpatient Hospital Stay: Payer: Medicare Other

## 2021-10-14 ENCOUNTER — Ambulatory Visit (HOSPITAL_BASED_OUTPATIENT_CLINIC_OR_DEPARTMENT_OTHER)
Admission: RE | Admit: 2021-10-14 | Discharge: 2021-10-14 | Disposition: A | Discharge status: Home / Self Care | Payer: Medicare Other | Source: Ambulatory Visit | Attending: Oncology | Admitting: Oncology

## 2021-10-14 DIAGNOSIS — Z85038 Personal history of other malignant neoplasm of large intestine: Secondary | ICD-10-CM | POA: Insufficient documentation

## 2021-10-14 DIAGNOSIS — Z8673 Personal history of transient ischemic attack (TIA), and cerebral infarction without residual deficits: Secondary | ICD-10-CM | POA: Insufficient documentation

## 2021-10-14 DIAGNOSIS — C182 Malignant neoplasm of ascending colon: Secondary | ICD-10-CM | POA: Insufficient documentation

## 2021-10-14 DIAGNOSIS — E538 Deficiency of other specified B group vitamins: Secondary | ICD-10-CM

## 2021-10-14 DIAGNOSIS — I10 Essential (primary) hypertension: Secondary | ICD-10-CM | POA: Insufficient documentation

## 2021-10-14 DIAGNOSIS — Z95828 Presence of other vascular implants and grafts: Secondary | ICD-10-CM

## 2021-10-14 DIAGNOSIS — R109 Unspecified abdominal pain: Secondary | ICD-10-CM | POA: Insufficient documentation

## 2021-10-14 LAB — BASIC METABOLIC PANEL - CANCER CENTER ONLY
Anion gap: 10 (ref 5–15)
BUN: 12 mg/dL (ref 8–23)
CO2: 24 mmol/L (ref 22–32)
Calcium: 10 mg/dL (ref 8.9–10.3)
Chloride: 102 mmol/L (ref 98–111)
Creatinine: 0.99 mg/dL (ref 0.44–1.00)
GFR, Estimated: 59 mL/min — ABNORMAL LOW (ref >60)
Glucose, Bld: 113 mg/dL — ABNORMAL HIGH (ref 70–99)
Potassium: 3.7 mmol/L (ref 3.5–5.1)
Sodium: 136 mmol/L (ref 135–145)

## 2021-10-14 LAB — CEA (ACCESS): CEA (CHCC): 2 ng/mL (ref 0.00–5.00)

## 2021-10-14 MED ORDER — SODIUM CHLORIDE 0.9% FLUSH: 10.0000 mL | Freq: Once | INTRAVENOUS | Status: DC

## 2021-10-14 MED ORDER — IOHEXOL 300 MG/ML  SOLN
80.0000 mL | Freq: Once | INTRAMUSCULAR | Status: AC | PRN
  Administered 2021-10-14: 80 mL via INTRAVENOUS

## 2021-10-14 MED ORDER — HEPARIN SOD (PORK) LOCK FLUSH 100 UNIT/ML IV SOLN
500.0000 [IU] | Freq: Once | INTRAVENOUS | Status: AC
  Administered 2021-10-14: 500 [IU] via INTRAVENOUS

## 2021-10-16 ENCOUNTER — Encounter: Payer: Self-pay | Admitting: Nurse Practitioner

## 2021-10-16 ENCOUNTER — Inpatient Hospital Stay: Payer: Medicare Other | Admitting: Nurse Practitioner

## 2021-10-16 VITALS — BP 140/80 | HR 100 | Temp 98.1°F | Resp 18 | Ht 62.0 in | Wt 198.0 lb

## 2021-10-16 DIAGNOSIS — R109 Unspecified abdominal pain: Secondary | ICD-10-CM | POA: Diagnosis not present

## 2021-10-16 DIAGNOSIS — Z8673 Personal history of transient ischemic attack (TIA), and cerebral infarction without residual deficits: Secondary | ICD-10-CM | POA: Diagnosis not present

## 2021-10-16 DIAGNOSIS — I10 Essential (primary) hypertension: Secondary | ICD-10-CM | POA: Diagnosis not present

## 2021-10-16 DIAGNOSIS — Z85038 Personal history of other malignant neoplasm of large intestine: Secondary | ICD-10-CM | POA: Diagnosis present

## 2021-10-16 DIAGNOSIS — E538 Deficiency of other specified B group vitamins: Secondary | ICD-10-CM | POA: Diagnosis not present

## 2021-10-16 DIAGNOSIS — C182 Malignant neoplasm of ascending colon: Secondary | ICD-10-CM

## 2021-10-16 NOTE — Progress Notes (Signed)
?[name] ?OFFICE PROGRESS NOTE ? ? ?Diagnosis: Colon cancer ? ?INTERVAL HISTORY:  ? ?[name] returns as scheduled.  No change in bowel habits.  No bleeding.  She has occasional right-sided abdominal pain. ? ?Objective: ? ?Vital signs in last 24 hours: ? ?Blood pressure 140/80, pulse 100, temperature 98.1 ?F (36.7 ?C), temperature source Oral, resp. rate 18, height _0  (1.575 m), weight 198 lb (89.8 kg), SpO2 100 %. ?  ? ?Lymphatics: No palpable cervical, supraclavicular, axillary or inguinal lymph nodes. ?Resp: Lungs clear bilaterally. ?Cardio: Regular rate and rhythm. ?GI: Abdomen soft and nontender.  No hepatosplenomegaly. ?Vascular: Trace edema lower leg bilaterally. ?Port-A-Cath without erythema. ? ?Lab Results: ? ?Lab Results  ?Component Value Date  ? WBC 10.6 (H) 06/12/2021  ? HGB 12.3 06/12/2021  ? HCT 36.5 06/12/2021  ? MCV 90.1 06/12/2021  ? PLT 246 06/12/2021  ? NEUTROABS 6.4 10/14/2020  ? ? ?Imaging: ? ?No results found. ? ?Medications: I have reviewed the patient's current medications. ? ?Assessment/Plan: ?Colon cancer (T3, N1b) ?Colonoscopy 11/24/2018-large 5 cm malignant appearing fungating mass encompassing the ileocecal valve was identified.  Pathology showed invasive adenocarcinoma, moderately differentiated.   ?CT abdomen/pelvis 12/07/2018-eccentric wall thickening in the cecum, around the ileocecal valve, compatible with findings reported at the recent colonoscopy.  Borderline enlarged adjacent lymph nodes in the ileocolic mesentery.  No evidence for liver metastases.   ?CEA 01/13/2019-12.7 ?CEA 02/23/19 postop/at start of adjuvant chemo - 1.83 ?01/17/2019 right hemicolectomy by Dr. Lucia Gaskins.  Final pathology showed invasive colonic adenocarcinoma, 3.3 cm; tumor invades through the muscularis propria into pericolonic tissue; margins not involved; metastatic carcinoma present in 2 of 18 lymph nodes; lymphovascular invasion present; perineural invasion not identified; mismatch  repair protein (IHC) normal; MSI stable. ?Declined genetic testing on 02/20/19, patient indicated she may reconsider after completing adjuvant treatment. Seen by Roma Kayser ?CT chest 02/17/2019-tiny bilateral pulmonary nodules felt to be unlikely related to metastatic disease ?PAC placement per Dr. Lucia Gaskins 02/22/2019 ?Cycle 1 adjuvant FOLFOX 02/23/19  ?Cycle 2 adjuvant FOLFOX 03/09/2019 ?Cycle 3 adjuvant FOLFOX 03/23/2019 (oxaliplatin dose reduced due to low normal neutrophil count) ?Cycle 4 adjuvant FOLFOX 04/06/2019 ?Cycle 5 adjuvant FOLFOX 05/01/2019 ?Cycle 6 adjuvant FOLFOX 05/15/2019 (oxaliplatin held due to neuropathy) ?CT 12/04/2019-stable right thyroid nodule, no evidence of metastatic disease, stable bilateral lung nodules ?CTs 10/15/2020- no evidence of metastatic disease chest, abdomen, pelvis.  Occasional tiny pulmonary nodules stable.  Interval enlargement of a nodule right lobe of the thyroid. ?CTs 10/14/2021-no metastatic disease in the chest, abdomen or pelvis.  Solitary tiny right lower lobe pulmonary nodule stable. ?  ?History of iron deficiency anemia ?Hypertension ?History of MI status post stent placement ?History of stroke 1996 ?Oxaliplatin neuropathy ?Anorexia/weight loss ?Report of balance difficulty ?B 12 deficiency-B12 weekly x4 beginning 12/09/2019.  On oral B12. ?Right thyroid nodule on CT 10/15/2020.  Referred for thyroid ultrasound.  Thyroid ultrasound 10/31/2020-3.3 cm right inferior TR 3 nodule meets criteria for biopsy, this correlates with the chest CT finding.  Additional 1.5 cm right superior T3 nodule meets criteria for follow-up in 1 year.  FNA biopsy of right inferior thyroid lesion 10/13/175-LTJQZ follicular epithelium present.  Nondiagnostic material. ? ?Disposition: [name] remains in remission from colon cancer.  She is now approximately 3 years out from diagnosis.  Plan for CEA and follow-up visit in 6 months. ? ?We recommend she proceed with a surveillance colonoscopy.  She is  undecided. ? ?She would like for the Port-A-Cath to remain in  place for now.  She understands the need for the port to be flushed every 6 to 8 weeks. ? ?She will continue to follow-up with Dr. Harlow Asa regarding the thyroid nodule. ? ? ?Ned Card ANP/GNP-BC  ? ?10/16/2021  ?1:25 PM ? ? ? ? ? ? ? ?

## 2021-10-27 ENCOUNTER — Other Ambulatory Visit: Payer: Self-pay | Admitting: Cardiology

## 2021-10-27 DIAGNOSIS — E785 Hyperlipidemia, unspecified: Secondary | ICD-10-CM

## 2021-10-27 DIAGNOSIS — I214 Non-ST elevation (NSTEMI) myocardial infarction: Secondary | ICD-10-CM

## 2021-10-27 DIAGNOSIS — I2510 Atherosclerotic heart disease of native coronary artery without angina pectoris: Secondary | ICD-10-CM

## 2021-11-18 ENCOUNTER — Other Ambulatory Visit: Payer: Self-pay | Admitting: Surgery

## 2021-11-18 DIAGNOSIS — E042 Nontoxic multinodular goiter: Secondary | ICD-10-CM

## 2021-11-27 ENCOUNTER — Inpatient Hospital Stay: Payer: Medicare Other | Attending: Oncology

## 2021-11-27 VITALS — BP 132/64 | HR 81 | Temp 97.7°F | Resp 20 | Ht 62.0 in | Wt 198.0 lb

## 2021-11-27 DIAGNOSIS — Z452 Encounter for adjustment and management of vascular access device: Secondary | ICD-10-CM | POA: Diagnosis present

## 2021-11-27 DIAGNOSIS — Z95828 Presence of other vascular implants and grafts: Secondary | ICD-10-CM

## 2021-11-27 DIAGNOSIS — Z85038 Personal history of other malignant neoplasm of large intestine: Secondary | ICD-10-CM | POA: Diagnosis present

## 2021-11-27 DIAGNOSIS — C182 Malignant neoplasm of ascending colon: Secondary | ICD-10-CM

## 2021-11-27 DIAGNOSIS — E538 Deficiency of other specified B group vitamins: Secondary | ICD-10-CM

## 2021-11-27 MED ORDER — SODIUM CHLORIDE 0.9% FLUSH
10.0000 mL | Freq: Once | INTRAVENOUS | Status: AC
  Administered 2021-11-27: 10 mL

## 2021-11-27 MED ORDER — HEPARIN SOD (PORK) LOCK FLUSH 100 UNIT/ML IV SOLN
500.0000 [IU] | Freq: Once | INTRAVENOUS | Status: AC
  Administered 2021-11-27: 500 [IU]

## 2021-11-27 NOTE — Patient Instructions (Signed)
Heparin injection ?What is this medication? ?HEPARIN (HEP a rin) is an anticoagulant. It is used to treat or prevent clots in the veins, arteries, lungs, or heart. It stops clots from forming or getting bigger. This medicine prevents clotting during open-heart surgery, dialysis, or in patients who are confined to bed. ?This medicine may be used for other purposes; ask your health care provider or pharmacist if you have questions. ?COMMON BRAND NAME(S): Hep-Lock, Hep-Lock U/P, Hepflush-10, Monoject Prefill Advanced Heparin Lock Flush, SASH Normal Saline and Heparin ?What should I tell my care team before I take this medication? ?They need to know if you have any of these conditions: ?bleeding disorders, such as hemophilia or low blood platelets ?bowel disease or diverticulitis ?endocarditis ?high blood pressure ?liver disease ?recent surgery or delivery of a baby ?stomach ulcers ?an unusual or allergic reaction to heparin, benzyl alcohol, sulfites, other medicines, foods, dyes, or preservatives ?pregnant or trying to get pregnant ?breast-feeding ?How should I use this medication? ?This medicine is given by injection or infusion into a vein. It can also be given by injection of small amounts under the skin. It is usually given by a health care professional in a hospital or clinic setting. ?If you get this medicine at home, you will be taught how to prepare and give this medicine. Use exactly as directed. Take your medicine at regular intervals. Do not take it more often than directed. Do not stop taking except on your doctor's advice. Stopping this medicine may increase your risk of a blot clot. Be sure to refill your prescription before you run out of medicine. ?It is important that you put your used needles and syringes in a special sharps container. Do not put them in a trash can. If you do not have a sharps container, call your pharmacist or healthcare provider to get one. ?Talk to your pediatrician regarding the  use of this medicine in children. While this medicine may be prescribed for children for selected conditions, precautions do apply. ?Overdosage: If you think you have taken too much of this medicine contact a poison control center or emergency room at once. ?NOTE: This medicine is only for you. Do not share this medicine with others. ?What if I miss a dose? ?If you miss a dose, take it as soon as you can. If it is almost time for your next dose, take only that dose. Do not take double or extra doses. ?What may interact with this medication? ?Do not take this medicine with any of the following medications: ?aspirin and aspirin-like drugs ?mifepristone ?medicines that treat or prevent blood clots like warfarin, enoxaparin, and dalteparin ?palifermin ?protamine ?This medicine may also interact with the following medications: ?dextran ?digoxin ?hydroxychloroquine ?medicines for treating colds or allergies ?nicotine ?NSAIDs, medicines for pain and inflammation, like ibuprofen or naproxen ?phenylbutazone ?tetracycline antibiotics ?This list may not describe all possible interactions. Give your health care provider a list of all the medicines, herbs, non-prescription drugs, or dietary supplements you use. Also tell them if you smoke, drink alcohol, or use illegal drugs. Some items may interact with your medicine. ?What should I watch for while using this medication? ?Visit your healthcare professional for regular checks on your progress. You may need blood work done while you are taking this medicine. Your condition will be monitored carefully while you are receiving this medicine. It is important not to miss any appointments. ?Wear a medical ID bracelet or chain, and carry a card that describes your disease and details   of your medicine and dosage times. ?Notify your doctor or healthcare professional at once if you have cold, blue hands or feet. ?If you are going to need surgery or other procedure, tell your healthcare  professional that you are using this medicine. ?Avoid sports and activities that might cause injury while you are using this medicine. Severe falls or injuries can cause unseen bleeding. Be careful when using sharp tools or knives. Consider using an electric razor. Take special care brushing or flossing your teeth. Report any injuries, bruising, or red spots on the skin to your healthcare professional. ?Using this medicine for a long time may weaken your bones and increase the risk of bone fractures. ?You should make sure that you get enough calcium and vitamin D while you are taking this medicine. Discuss the foods you eat and the vitamins you take with your healthcare professional. ?Wear a medical ID bracelet or chain. Carry a card that describes your disease and details of your medicine and dosage times. ?What side effects may I notice from receiving this medication? ?Side effects that you should report to your doctor or health care professional as soon as possible: ?allergic reactions like skin rash, itching or hives, swelling of the face, lips, or tongue ?bone pain ?fever, chills ?nausea, vomiting ?signs and symptoms of bleeding such as bloody or black, tarry stools; red or dark-brown urine; spitting up blood or brown material that looks like coffee grounds; red spots on the skin; unusual bruising or bleeding from the eye, gums, or nose ?signs and symptoms of a blood clot such as chest pain; shortness of breath; pain, swelling, or warmth in the leg ?signs and symptoms of a stroke such as changes in vision; confusion; trouble speaking or understanding; severe headaches; sudden numbness or weakness of the face, arm or leg; trouble walking; dizziness; loss of coordination ?Side effects that usually do not require medical attention (report to your doctor or health care professional if they continue or are bothersome): ?hair loss ?pain, redness, or irritation at site where injected ?This list may not describe all  possible side effects. Call your doctor for medical advice about side effects. You may report side effects to FDA at 1-800-FDA-1088. ?Where should I keep my medication? ?Keep out of the reach of children. ?Store unopened vials at room temperature between 15 and 30 degrees C (59 and 86 degrees F). Do not freeze. Do not use if solution is discolored or particulate matter is present. Throw away any unused medicine after the expiration date. ?NOTE: This sheet is a summary. It may not cover all possible information. If you have questions about this medicine, talk to your doctor, pharmacist, or health care provider. ?? 2023 Elsevier/Gold Standard (2020-07-04 00:00:00) ? ?

## 2022-01-08 ENCOUNTER — Inpatient Hospital Stay: Payer: Medicare Other | Attending: Oncology

## 2022-01-08 VITALS — BP 159/66 | HR 95 | Temp 98.7°F | Resp 20 | Ht 62.0 in | Wt 198.0 lb

## 2022-01-08 DIAGNOSIS — Z95828 Presence of other vascular implants and grafts: Secondary | ICD-10-CM

## 2022-01-08 DIAGNOSIS — E538 Deficiency of other specified B group vitamins: Secondary | ICD-10-CM

## 2022-01-08 DIAGNOSIS — C182 Malignant neoplasm of ascending colon: Secondary | ICD-10-CM

## 2022-01-08 MED ORDER — SODIUM CHLORIDE 0.9% FLUSH
10.0000 mL | Freq: Once | INTRAVENOUS | Status: AC
  Administered 2022-01-08: 10 mL

## 2022-01-08 MED ORDER — HEPARIN SOD (PORK) LOCK FLUSH 100 UNIT/ML IV SOLN
500.0000 [IU] | Freq: Once | INTRAVENOUS | Status: AC
  Administered 2022-01-08: 500 [IU]

## 2022-01-08 MED ORDER — CYANOCOBALAMIN 1000 MCG/ML IJ SOLN
1000.0000 ug | Freq: Once | INTRAMUSCULAR | Status: AC
  Administered 2022-01-08: 1000 ug via SUBCUTANEOUS
  Filled 2022-01-08: qty 1

## 2022-01-08 NOTE — Patient Instructions (Signed)
Vitamin B12 Injection What is this medication? Vitamin B12 (VAHY tuh min B12) prevents and treats low vitamin B12 levels in your body. It is used in people who do not get enough vitamin B12 from their diet or when their digestive tract does not absorb enough. Vitamin B12 plays an important role in maintaining the health of your nervous system and red blood cells. This medicine may be used for other purposes; ask your health care provider or pharmacist if you have questions. COMMON BRAND NAME(S): B-12 Compliance Kit, B-12 Injection Kit, Cyomin, Dodex, LA-12, Nutri-Twelve, Physicians EZ Use B-12, Primabalt What should I tell my care team before I take this medication? They need to know if you have any of these conditions: Kidney disease Leber's disease Megaloblastic anemia An unusual or allergic reaction to cyanocobalamin, cobalt, other medications, foods, dyes, or preservatives Pregnant or trying to get pregnant Breast-feeding How should I use this medication? This medication is injected into a muscle or deeply under the skin. It is usually given in a clinic or care team's office. However, your care team may teach you how to inject yourself. Follow all instructions. Talk to your care team about the use of this medication in children. Special care may be needed. Overdosage: If you think you have taken too much of this medicine contact a poison control center or emergency room at once. NOTE: This medicine is only for you. Do not share this medicine with others. What if I miss a dose? If you are given your dose at a clinic or care team's office, call to reschedule your appointment. If you give your own injections, and you miss a dose, take it as soon as you can. If it is almost time for your next dose, take only that dose. Do not take double or extra doses. What may interact with this medication? Alcohol Colchicine This list may not describe all possible interactions. Give your health care  provider a list of all the medicines, herbs, non-prescription drugs, or dietary supplements you use. Also tell them if you smoke, drink alcohol, or use illegal drugs. Some items may interact with your medicine. What should I watch for while using this medication? Visit your care team regularly. You may need blood work done while you are taking this medication. You may need to follow a special diet. Talk to your care team. Limit your alcohol intake and avoid smoking to get the best benefit. What side effects may I notice from receiving this medication? Side effects that you should report to your care team as soon as possible: Allergic reactions--skin rash, itching, hives, swelling of the face, lips, tongue, or throat Swelling of the ankles, hands, or feet Trouble breathing Side effects that usually do not require medical attention (report to your care team if they continue or are bothersome): Diarrhea This list may not describe all possible side effects. Call your doctor for medical advice about side effects. You may report side effects to FDA at 1-800-FDA-1088. Where should I keep my medication? Keep out of the reach of children. Store at room temperature between 15 and 30 degrees C (59 and 85 degrees F). Protect from light. Throw away any unused medication after the expiration date. NOTE: This sheet is a summary. It may not cover all possible information. If you have questions about this medicine, talk to your doctor, pharmacist, or health care provider.  2023 Elsevier/Gold Standard (2020-08-01 00:00:00)

## 2022-01-13 ENCOUNTER — Other Ambulatory Visit: Payer: Self-pay | Admitting: Physician Assistant

## 2022-01-22 ENCOUNTER — Other Ambulatory Visit: Payer: Self-pay | Admitting: Cardiology

## 2022-01-22 DIAGNOSIS — I2510 Atherosclerotic heart disease of native coronary artery without angina pectoris: Secondary | ICD-10-CM

## 2022-01-22 DIAGNOSIS — I214 Non-ST elevation (NSTEMI) myocardial infarction: Secondary | ICD-10-CM

## 2022-01-22 DIAGNOSIS — E785 Hyperlipidemia, unspecified: Secondary | ICD-10-CM

## 2022-02-19 ENCOUNTER — Inpatient Hospital Stay: Payer: Medicare Other | Attending: Oncology

## 2022-02-19 VITALS — BP 157/75 | HR 83 | Temp 97.7°F | Resp 20

## 2022-02-19 DIAGNOSIS — C182 Malignant neoplasm of ascending colon: Secondary | ICD-10-CM

## 2022-02-19 DIAGNOSIS — E538 Deficiency of other specified B group vitamins: Secondary | ICD-10-CM | POA: Diagnosis not present

## 2022-02-19 DIAGNOSIS — Z95828 Presence of other vascular implants and grafts: Secondary | ICD-10-CM

## 2022-02-19 MED ORDER — CYANOCOBALAMIN 1000 MCG/ML IJ SOLN
1000.0000 ug | Freq: Once | INTRAMUSCULAR | Status: AC
  Administered 2022-02-19: 1000 ug via SUBCUTANEOUS
  Filled 2022-02-19: qty 1

## 2022-02-19 MED ORDER — SODIUM CHLORIDE 0.9% FLUSH
10.0000 mL | Freq: Once | INTRAVENOUS | Status: AC
  Administered 2022-02-19: 10 mL

## 2022-02-19 MED ORDER — HEPARIN SOD (PORK) LOCK FLUSH 100 UNIT/ML IV SOLN
500.0000 [IU] | Freq: Once | INTRAVENOUS | Status: AC
  Administered 2022-02-19: 500 [IU]

## 2022-02-19 NOTE — Patient Instructions (Signed)
Vitamin B12 Injection What is this medication? Vitamin B12 (VAHY tuh min B12) prevents and treats low vitamin B12 levels in your body. It is used in people who do not get enough vitamin B12 from their diet or when their digestive tract does not absorb enough. Vitamin B12 plays an important role in maintaining the health of your nervous system and red blood cells. This medicine may be used for other purposes; ask your health care provider or pharmacist if you have questions. COMMON BRAND NAME(S): B-12 Compliance Kit, B-12 Injection Kit, Cyomin, Dodex, LA-12, Nutri-Twelve, Physicians EZ Use B-12, Primabalt What should I tell my care team before I take this medication? They need to know if you have any of these conditions: Kidney disease Leber's disease Megaloblastic anemia An unusual or allergic reaction to cyanocobalamin, cobalt, other medications, foods, dyes, or preservatives Pregnant or trying to get pregnant Breast-feeding How should I use this medication? This medication is injected into a muscle or deeply under the skin. It is usually given in a clinic or care team's office. However, your care team may teach you how to inject yourself. Follow all instructions. Talk to your care team about the use of this medication in children. Special care may be needed. Overdosage: If you think you have taken too much of this medicine contact a poison control center or emergency room at once. NOTE: This medicine is only for you. Do not share this medicine with others. What if I miss a dose? If you are given your dose at a clinic or care team's office, call to reschedule your appointment. If you give your own injections, and you miss a dose, take it as soon as you can. If it is almost time for your next dose, take only that dose. Do not take double or extra doses. What may interact with this medication? Alcohol Colchicine This list may not describe all possible interactions. Give your health care  provider a list of all the medicines, herbs, non-prescription drugs, or dietary supplements you use. Also tell them if you smoke, drink alcohol, or use illegal drugs. Some items may interact with your medicine. What should I watch for while using this medication? Visit your care team regularly. You may need blood work done while you are taking this medication. You may need to follow a special diet. Talk to your care team. Limit your alcohol intake and avoid smoking to get the best benefit. What side effects may I notice from receiving this medication? Side effects that you should report to your care team as soon as possible: Allergic reactions--skin rash, itching, hives, swelling of the face, lips, tongue, or throat Swelling of the ankles, hands, or feet Trouble breathing Side effects that usually do not require medical attention (report to your care team if they continue or are bothersome): Diarrhea This list may not describe all possible side effects. Call your doctor for medical advice about side effects. You may report side effects to FDA at 1-800-FDA-1088. Where should I keep my medication? Keep out of the reach of children. Store at room temperature between 15 and 30 degrees C (59 and 85 degrees F). Protect from light. Throw away any unused medication after the expiration date. NOTE: This sheet is a summary. It may not cover all possible information. If you have questions about this medicine, talk to your doctor, pharmacist, or health care provider.  2023 Elsevier/Gold Standard (2020-08-01 00:00:00)

## 2022-03-30 ENCOUNTER — Inpatient Hospital Stay: Payer: Medicare Other | Attending: Oncology

## 2022-03-30 ENCOUNTER — Telehealth: Payer: Self-pay

## 2022-03-30 ENCOUNTER — Inpatient Hospital Stay: Payer: Medicare Other | Admitting: Oncology

## 2022-03-30 ENCOUNTER — Inpatient Hospital Stay: Payer: Medicare Other

## 2022-03-30 VITALS — BP 140/80 | HR 98 | Temp 98.1°F | Resp 18 | Ht 62.0 in | Wt 194.0 lb

## 2022-03-30 DIAGNOSIS — I10 Essential (primary) hypertension: Secondary | ICD-10-CM | POA: Insufficient documentation

## 2022-03-30 DIAGNOSIS — E538 Deficiency of other specified B group vitamins: Secondary | ICD-10-CM

## 2022-03-30 DIAGNOSIS — C182 Malignant neoplasm of ascending colon: Secondary | ICD-10-CM

## 2022-03-30 DIAGNOSIS — Z9221 Personal history of antineoplastic chemotherapy: Secondary | ICD-10-CM | POA: Insufficient documentation

## 2022-03-30 DIAGNOSIS — Z8673 Personal history of transient ischemic attack (TIA), and cerebral infarction without residual deficits: Secondary | ICD-10-CM | POA: Insufficient documentation

## 2022-03-30 DIAGNOSIS — Z85038 Personal history of other malignant neoplasm of large intestine: Secondary | ICD-10-CM | POA: Diagnosis present

## 2022-03-30 DIAGNOSIS — Z95828 Presence of other vascular implants and grafts: Secondary | ICD-10-CM

## 2022-03-30 DIAGNOSIS — I252 Old myocardial infarction: Secondary | ICD-10-CM | POA: Insufficient documentation

## 2022-03-30 LAB — CEA (ACCESS): CEA (CHCC): 1.83 ng/mL (ref 0.00–5.00)

## 2022-03-30 MED ORDER — HEPARIN SOD (PORK) LOCK FLUSH 100 UNIT/ML IV SOLN
500.0000 [IU] | Freq: Once | INTRAVENOUS | Status: AC
  Administered 2022-03-30: 500 [IU]

## 2022-03-30 MED ORDER — SODIUM CHLORIDE 0.9% FLUSH
10.0000 mL | Freq: Once | INTRAVENOUS | Status: AC
  Administered 2022-03-30: 10 mL

## 2022-03-30 MED ORDER — CYANOCOBALAMIN 1000 MCG/ML IJ SOLN
1000.0000 ug | Freq: Once | INTRAMUSCULAR | Status: AC
  Administered 2022-03-30: 1000 ug via SUBCUTANEOUS
  Filled 2022-03-30: qty 1

## 2022-03-30 NOTE — Telephone Encounter (Signed)
Called to inform pt that MD Benay Spice recommends a follow-up colonoscopy from her previous one in 2020. Pt verbally confirms that 2020 was her last colonoscopy. Pt states that she "will think about it" and if she decides to agree to a colonoscopy that she will call [facility].

## 2022-03-30 NOTE — Progress Notes (Signed)
[name] OFFICE PROGRESS NOTE   Diagnosis: Colon cancer  INTERVAL HISTORY:   [name] returns as scheduled.  She reports malaise.  No difficulty with bowel function.  The Port-A-Cath remains in place.  Objective:  Vital signs in last 24 hours:  Blood pressure (!) 140/80, pulse 98, temperature 98.1 F (36.7 C), temperature source Oral, resp. rate 18, height _0  (1.575 m), weight 194 lb (88 kg), SpO2 100 %.   HEENT: 1-1.5 cm right thyroid nodule Lymphatics: No cervical, supraclavicular, axillary, or inguinal nodes Resp: Lungs clear bilaterally Cardio: Regular rate and rhythm GI: No hepatosplenomegaly, nontender, no mass Vascular: No leg edema   Portacath/PICC-without erythema  Lab Results:  Lab Results  Component Value Date   WBC 10.6 (H) 06/12/2021   HGB 12.3 06/12/2021   HCT 36.5 06/12/2021   MCV 90.1 06/12/2021   PLT 246 06/12/2021   NEUTROABS 6.4 10/14/2020    CMP  Lab Results  Component Value Date   NA 136 10/14/2021   K 3.7 10/14/2021   CL 102 10/14/2021   CO2 24 10/14/2021   GLUCOSE 113 (H) 10/14/2021   BUN 12 10/14/2021   CREATININE 0.99 10/14/2021   CALCIUM 10.0 10/14/2021   PROT 6.5 06/10/2021   ALBUMIN 3.2 (L) 06/10/2021   AST 13 (L) 06/10/2021   ALT 18 06/10/2021   ALKPHOS 80 06/10/2021   BILITOT 0.6 06/10/2021   GFRNONAA 59 (L) 10/14/2021   GFRAA 58 (L) 12/04/2019    Lab Results  Component Value Date   CEA1 2.54 10/14/2020   CEA 1.83 03/30/2022    Medications: I have reviewed the patient's current medications.   Assessment/Plan: Colon cancer (T3, N1b) Colonoscopy 11/24/2018-large 5 cm malignant appearing fungating mass encompassing the ileocecal valve was identified.  Pathology showed invasive adenocarcinoma, moderately differentiated.   CT abdomen/pelvis 12/07/2018-eccentric wall thickening in the cecum, around the ileocecal valve, compatible with findings reported at the recent colonoscopy.  Borderline enlarged  adjacent lymph nodes in the ileocolic mesentery.  No evidence for liver metastases.   CEA 01/13/2019-12.7 CEA 02/23/19 postop/at start of adjuvant chemo - 1.83 01/17/2019 right hemicolectomy by Dr. Lucia Gaskins.  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 Declined genetic testing on 02/20/19, patient indicated she may reconsider after completing adjuvant treatment. Seen by Roma Kayser CT chest 02/17/2019-tiny bilateral pulmonary nodules felt to be unlikely related to metastatic disease PAC placement per Dr. Lucia Gaskins 02/22/2019 Cycle 1 adjuvant FOLFOX 02/23/19  Cycle 2 adjuvant FOLFOX 03/09/2019 Cycle 3 adjuvant FOLFOX 03/23/2019 (oxaliplatin dose reduced due to low normal neutrophil count) Cycle 4 adjuvant FOLFOX 04/06/2019 Cycle 5 adjuvant FOLFOX 05/01/2019 Cycle 6 adjuvant FOLFOX 05/15/2019 (oxaliplatin held due to neuropathy) CT 12/04/2019-stable right thyroid nodule, no evidence of metastatic disease, stable bilateral lung nodules CTs 10/15/2020- no evidence of metastatic disease chest, abdomen, pelvis.  Occasional tiny pulmonary nodules stable.  Interval enlargement of a nodule right lobe of the thyroid. CTs 10/14/2021-no metastatic disease in the chest, abdomen or pelvis.  Solitary tiny right lower lobe pulmonary nodule stable.   History of iron deficiency anemia Hypertension History of MI status post stent placement History of stroke 1996 Oxaliplatin neuropathy Anorexia/weight loss Report of balance difficulty B 12 deficiency-B12 weekly x4 beginning 12/09/2019.  On oral B12. Right thyroid nodule on CT  10/15/2020.  Referred for thyroid ultrasound.  Thyroid ultrasound 10/31/2020-3.3 cm right inferior TR 3 nodule meets criteria for  biopsy, this correlates with the chest CT finding.  Additional 1.5 cm right superior T3 nodule meets criteria for follow-up in 1 year.  FNA biopsy of right inferior thyroid lesion 06/12/6646-RSHIQ follicular epithelium present.  Nondiagnostic material.    Disposition: [name] is in clinical remission from colon cancer.  She will return for an office visit and CEA in 6 months.  She would like to keep the Port-A-Cath in place.  We discussed the risk of infection and thrombosis.  She will return for Port-A-Cath flush every 8 weeks.  We will recommend she remain up-to-date on colonoscopy screening.  Betsy Coder, MD  03/30/2022  12:30 PM

## 2022-03-30 NOTE — Progress Notes (Unsigned)
Cardiology Office Note:    Date:  04/02/2022   ID:  Shantika, Bermea 1945/09/19, MRN [id number]  PCP:  Chesley Noon, MD   [facility] Providers Cardiologist:  Deboraha Goar Martinique, MD     Referring MD: Chesley Noon, MD   Chief Complaint  Patient presents with   Follow-up   Coronary Artery Disease    History of Present Illness:    Margaret Cockerill Goering is a 76 y.o. female with a hx of CAD, hypertension, hyperlipidemia, obesity, colon cancer s/p right hemicolectomy, and CVA.  Patient was  admitted to the hospital on 06/09/2021 with chest discomfort.  She initially went to the [facility] ED, blood work showed borderline elevated troponin.  She was taken to the Cath Lab on 06/11/2021.  Cardiac catheterization revealed a 95% mid RCA lesion that was heavily calcified and diffusely diseased, this was treated with a Synergy 3.5 x 24 mm DES postdilated to 3.75 mm in diameter.  There were at least 2 spots within the stented segment that was not fully expanded and had a 35% stenosis, therefore the patient was consider having higher than normal risk for restenosis.  EF was 55%.  Postprocedure, she was placed on aspirin to Brilinta with recommendation to continue dual antiplatelet therapy for at least 1 year.  Echocardiogram showed EF 60 to 65%, mild LVH, grade 1 DD, trivial MR, dilated aortic root at 37 mm.  She was subsequently seen in lipid clinic and started on Repatha. Has not had follow up labs yet.   Patient presents today for follow-up.  She states she has rare chest pain. May occur anytime and resolves on its own.   She has been compliant with dual antiplatelet therapy.  Her LE swelling resolved. It doesn't sound like she ever reduced her amlodipine dose. Oncology has recommended repeat colonoscopy with history of colon CA.     Past Medical History:  Diagnosis Date   Cancer of right colon ([place])    Constipation    CVA (cerebral infarction) 1996   Dyspnea    going up  stairs and hills   Family history of colon cancer    GERD (gastroesophageal reflux disease)    History of cardiomegaly    Hyperlipidemia    Hypertension    Iron deficiency anemia    NSTEMI (non-ST elevated myocardial infarction) ([place]) 2017   Personal history of colonic polyps    PONV (postoperative nausea and vomiting)    Right shoulder pain    more painful at night , cold makes it worse   Severe obesity ([place])     Past Surgical History:  Procedure Laterality Date   ABDOMINAL HYSTERECTOMY     BACK SURGERY     x2   CARDIAC CATHETERIZATION N/A 02/28/2016   Procedure: Left Heart Cath and Coronary Angiography;  Surgeon: Beckham Capistran M Martinique, MD;  Location: [place] [facility];  Service: Cardiovascular;  Laterality: N/A;   CARDIAC CATHETERIZATION N/A 02/28/2016   Procedure: Coronary Stent Intervention;  Surgeon: Naelani Lafrance M Martinique, MD;  Location: [place] [facility];  Service: Cardiovascular;  Laterality: N/A;   CHOLECYSTECTOMY     COLONOSCOPY  11/24/2018   CORONARY ANGIOPLASTY     CORONARY STENT INTERVENTION N/A 06/10/2021   Procedure: CORONARY STENT INTERVENTION;  Surgeon: Belva Crome, MD;  Location: [place] [facility];  Service: Cardiovascular;  Laterality: N/A;   CYST EXCISION Left    Cheek   LAPAROSCOPIC RIGHT HEMI COLECTOMY Right 01/17/2019   Procedure: LAPAROSCOPIC  RIGHT HEMI COLECTOMY;  Surgeon: Alphonsa Overall, MD;  Location: [facility];  Service: General;  Laterality: Right;   LEFT HEART CATH AND CORONARY ANGIOGRAPHY N/A 06/10/2021   Procedure: LEFT HEART CATH AND CORONARY ANGIOGRAPHY;  Surgeon: Belva Crome, MD;  Location: [place] [facility];  Service: Cardiovascular;  Laterality: N/A;   NASAL FRACTURE SURGERY     PORTACATH PLACEMENT N/A 02/22/2019   Procedure: POWER PORT PLACEMENT;  Surgeon: Alphonsa Overall, MD;  Location: [place];  Service: General;  Laterality: N/A;   UPPER GI ENDOSCOPY  11/24/2018    Current Medications: Current Meds  Medication Sig   acetaminophen (TYLENOL) 650 MG CR  tablet Take 650 mg by mouth every 8 (eight) hours as needed for pain.   amLODipine (NORVASC) 10 MG tablet Take 10 mg by mouth daily.   ASPIRIN 81 PO Take 81 mg by mouth daily.   Evolocumab (REPATHA SURECLICK) 275 MG/ML SOAJ Inject 140 mg into the skin every 14 (fourteen) days.   ezetimibe (ZETIA) 10 MG tablet TAKE 1 TABLET(10 MG) BY MOUTH DAILY   lidocaine-prilocaine (EMLA) cream Apply 1 application. topically as needed. Apply 1/2 tablespoon to port 2 hours prior to stick and cover with plastic wrap to numb site as needed   loperamide (IMODIUM) 2 MG capsule Take 2-4 mg by mouth 4 (four) times daily as needed for diarrhea or loose stools.   metoprolol succinate (TOPROL-XL) 25 MG 24 hr tablet TAKE 1/2 TABLET BY MOUTH EVERY DAY   nitroGLYCERIN (NITROSTAT) 0.4 MG SL tablet Place 1 tablet (0.4 mg total) under the tongue every 5 (five) minutes x 3 doses as needed for chest pain.   nystatin cream (MYCOSTATIN) Apply 1 application topically 2 (two) times daily as needed (rash).   omeprazole (PRILOSEC) 40 MG capsule Take 40 mg by mouth daily.   ticagrelor (BRILINTA) 90 MG TABS tablet Take 1 tablet (90 mg total) by mouth 2 (two) times daily.   valsartan (DIOVAN) 160 MG tablet Take 160 mg by mouth daily.   vitamin B-12 (CYANOCOBALAMIN) 1000 MCG tablet Take 1,000 mcg by mouth daily.   VITAMIN D PO Take 1 tablet by mouth daily.   VITAMIN E PO Take 1 tablet by mouth daily.     Allergies:   Guaifenesin er, Lotensin [benazepril hcl], and Propoxyphene   Social History   Socioeconomic History   Marital status: Married    Spouse name: Not on file   Number of children: Not on file   Years of education: Not on file   Highest education level: Not on file  Occupational History   Not on file  Tobacco Use   Smoking status: Never   Smokeless tobacco: Never  Vaping Use   Vaping Use: Never used  Substance and Sexual Activity   Alcohol use: No   Drug use: No   Sexual activity: Not on file  Other Topics  Concern   Not on file  Social History Narrative   Not on file   Social Determinants of Health   Financial Resource Strain: Not on file  Food Insecurity: Not on file  Transportation Needs: Not on file  Physical Activity: Not on file  Stress: Not on file  Social Connections: Not on file     Family History: The patient's family history includes Breast cancer in her cousin and maternal aunt; CAD in her daughter; Colon cancer (age of onset: 30) in her sister; Colon polyps in her sister; Esophageal cancer (age of onset: 4) in her  sister; Lung cancer (age of onset: 77) in her brother.  ROS:   Please see the history of present illness.     All other systems reviewed and are negative.  EKGs/Labs/Other Studies Reviewed:    The following studies were reviewed today:  Echo 06/11/2021 1. Difficult study due to poor echo windows.   2. Left ventricular ejection fraction, by estimation, is 60 to 65%. The  left ventricle has normal function. The left ventricle has no regional  wall motion abnormalities. There is mild left ventricular hypertrophy.  Left ventricular diastolic parameters  are consistent with Grade I diastolic dysfunction (impaired relaxation).   3. Right ventricular systolic function is normal. The right ventricular  size is normal.   4. The mitral valve is grossly normal. Trivial mitral valve  regurgitation.   5. The aortic valve is tricuspid. There is mild thickening of the aortic  valve. Aortic valve regurgitation is not visualized. Aortic valve  sclerosis is present, with no evidence of aortic valve stenosis.   6. Aortic dilatation noted. 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   Comparison(s): No prior Echocardiogram.   EKG:  EKG is not ordered today.    Recent Labs: 06/09/2021: TSH 3.281 06/10/2021: ALT 18 06/12/2021: Hemoglobin 12.3; Platelets 246 10/14/2021: BUN 12; Creatinine 0.99; Potassium 3.7; Sodium 136  Recent Lipid Panel     Component Value Date/Time   CHOL 150 06/10/2021 0439   TRIG 69 06/10/2021 0439   HDL 47 06/10/2021 0439   CHOLHDL 3.2 06/10/2021 0439   VLDL 14 06/10/2021 0439   LDLCALC 89 06/10/2021 0439     Risk Assessment/Calculations:           Physical Exam:    VS:  BP 122/84 (BP Location: Left Arm, Patient Position: Sitting, Cuff Size: Large)   Pulse 71   Ht '5\' 2"'$  (1.575 m)   Wt 194 lb 9.6 oz (88.3 kg)   SpO2 99%   BMI 35.59 kg/m     Wt Readings from Last 3 Encounters:  04/02/22 194 lb 9.6 oz (88.3 kg)  03/30/22 194 lb (88 kg)  01/08/22 198 lb (89.8 kg)     GEN:  Well nourished, obese,  in no acute distress HEENT: Normal NECK: No JVD; No carotid bruits LYMPHATICS: No lymphadenopathy CARDIAC: RRR, no murmurs, rubs, gallops. Mild edema RESPIRATORY:  Clear to auscultation without rales, wheezing or rhonchi  ABDOMEN: Soft, non-tender, non-distended MUSCULOSKELETAL:  No edema; No deformity  SKIN: Warm and dry NEUROLOGIC:  Alert and oriented x 3 PSYCHIATRIC:  Normal affect   ASSESSMENT:    1. Coronary artery disease involving native coronary artery of native heart without angina pectoris   2. Essential hypertension   3. Hyperlipidemia LDL goal <70   4. CAD S/P percutaneous coronary angioplasty      PLAN:    In order of problems listed above:  CAD: s/p PCI to RCA with DES in January 2023.    Continue aspirin and Brilinta for one year. May stop Brilinta at the beginning of January. Would postpone colonoscopy until after she comes off Brilinta.   Hypertension: Blood pressure well controlled on current therapy  Hyperlipidemia: Continue Zetia.  Now on Repatha. Will follow up lab work today.   History of CVA: No recurrence.  5.   Edema.  Resolved.         Follow up with me in 6 months   Medication Adjustments/Labs and Tests Ordered: Current medicines are reviewed at  length with the patient today.  Concerns regarding medicines are outlined above.  Orders Placed  This Encounter  Procedures   Basic metabolic panel   Lipid panel   Hepatic function panel   CBC w/Diff/Platelet   No orders of the defined types were placed in this encounter.   Patient Instructions  Stop Brilinta after Jan 4.  We will check blood work today  Continue your current therapy     Signed, Mariadelaluz Guggenheim Martinique, MD  04/02/2022 11:02 AM    [place]

## 2022-04-02 ENCOUNTER — Encounter: Payer: Self-pay | Admitting: Cardiology

## 2022-04-02 ENCOUNTER — Ambulatory Visit: Payer: Medicare Other | Attending: Cardiology | Admitting: Cardiology

## 2022-04-02 VITALS — BP 122/84 | HR 71 | Ht 62.0 in | Wt 194.6 lb

## 2022-04-02 DIAGNOSIS — I10 Essential (primary) hypertension: Secondary | ICD-10-CM

## 2022-04-02 DIAGNOSIS — I2510 Atherosclerotic heart disease of native coronary artery without angina pectoris: Secondary | ICD-10-CM

## 2022-04-02 DIAGNOSIS — E785 Hyperlipidemia, unspecified: Secondary | ICD-10-CM | POA: Diagnosis not present

## 2022-04-02 DIAGNOSIS — Z9861 Coronary angioplasty status: Secondary | ICD-10-CM

## 2022-04-02 NOTE — Patient Instructions (Signed)
Stop Brilinta after Jan 4.  We will check blood work today  Continue your current therapy

## 2022-04-03 LAB — CBC WITH DIFFERENTIAL/PLATELET
Basophils Absolute: 0.1 10*3/uL (ref 0.0–0.2)
Basos: 1 %
EOS (ABSOLUTE): 0.1 10*3/uL (ref 0.0–0.4)
Eos: 1 %
Hematocrit: 40.9 % (ref 34.0–46.6)
Hemoglobin: 13.1 g/dL (ref 11.1–15.9)
Immature Grans (Abs): 0 10*3/uL (ref 0.0–0.1)
Immature Granulocytes: 0 %
Lymphocytes Absolute: 2 10*3/uL (ref 0.7–3.1)
Lymphs: 25 %
MCH: 29 pg (ref 26.6–33.0)
MCHC: 32 g/dL (ref 31.5–35.7)
MCV: 91 fL (ref 79–97)
Monocytes Absolute: 0.5 10*3/uL (ref 0.1–0.9)
Monocytes: 6 %
Neutrophils Absolute: 5.4 10*3/uL (ref 1.4–7.0)
Neutrophils: 67 %
Platelets: 322 10*3/uL (ref 150–450)
RBC: 4.52 x10E6/uL (ref 3.77–5.28)
RDW: 13.4 % (ref 11.7–15.4)
WBC: 8.1 10*3/uL (ref 3.4–10.8)

## 2022-04-03 LAB — BASIC METABOLIC PANEL
BUN/Creatinine Ratio: 14 (ref 12–28)
BUN: 14 mg/dL (ref 8–27)
CO2: 25 mmol/L (ref 20–29)
Calcium: 9.9 mg/dL (ref 8.7–10.3)
Chloride: 102 mmol/L (ref 96–106)
Creatinine, Ser: 1.01 mg/dL — ABNORMAL HIGH (ref 0.57–1.00)
Glucose: 99 mg/dL (ref 70–99)
Potassium: 4.2 mmol/L (ref 3.5–5.2)
Sodium: 142 mmol/L (ref 134–144)
eGFR: 58 mL/min/{1.73_m2} — ABNORMAL LOW (ref >59)

## 2022-04-03 LAB — HEPATIC FUNCTION PANEL
ALT: 13 IU/L (ref 0–32)
AST: 14 IU/L (ref 0–40)
Albumin: 4.2 g/dL (ref 3.8–4.8)
Alkaline Phosphatase: 125 IU/L — ABNORMAL HIGH (ref 44–121)
Bilirubin Total: 0.3 mg/dL (ref 0.0–1.2)
Bilirubin, Direct: 0.12 mg/dL (ref 0.00–0.40)
Total Protein: 7.2 g/dL (ref 6.0–8.5)

## 2022-04-03 LAB — LIPID PANEL
Chol/HDL Ratio: 2.1 ratio (ref 0.0–4.4)
Cholesterol, Total: 110 mg/dL (ref 100–199)
HDL: 53 mg/dL (ref >39)
LDL Chol Calc (NIH): 40 mg/dL (ref 0–99)
Triglycerides: 90 mg/dL (ref 0–149)
VLDL Cholesterol Cal: 17 mg/dL (ref 5–40)

## 2022-04-04 ENCOUNTER — Other Ambulatory Visit: Payer: Self-pay | Admitting: Cardiology

## 2022-04-04 DIAGNOSIS — E785 Hyperlipidemia, unspecified: Secondary | ICD-10-CM

## 2022-04-04 DIAGNOSIS — I214 Non-ST elevation (NSTEMI) myocardial infarction: Secondary | ICD-10-CM

## 2022-04-04 DIAGNOSIS — I2510 Atherosclerotic heart disease of native coronary artery without angina pectoris: Secondary | ICD-10-CM

## 2022-04-06 ENCOUNTER — Telehealth: Payer: Self-pay

## 2022-04-06 NOTE — Telephone Encounter (Signed)
Spoke to patient lab results given.Patient stated she has not been taking Amlodipine.Stated she has a prescription for 10 mg.She wanted ask Dr.Jordan if she should take 5 mg or 10 mg.Advised Dr.Jordan is out of office this week.I will send message to him for advice.

## 2022-04-10 NOTE — Telephone Encounter (Signed)
Called patient left message on personal voice mail to call back. 

## 2022-04-15 NOTE — Telephone Encounter (Signed)
Pt is returning call. Transferred to Elly Modena, LPN.

## 2022-04-15 NOTE — Telephone Encounter (Signed)
Spoke to patient she stated she has been taking Amlodipine 10 mg 1/2 tablet daily.Dr.Jordan advised to continue.She will call back when she is out of 10 mg and request a 5 mg tablet.

## 2022-04-15 NOTE — Telephone Encounter (Signed)
Called patient left message on voice mail to call back.

## 2022-05-19 ENCOUNTER — Other Ambulatory Visit: Payer: Self-pay

## 2022-05-19 DIAGNOSIS — Z85038 Personal history of other malignant neoplasm of large intestine: Secondary | ICD-10-CM

## 2022-05-25 ENCOUNTER — Inpatient Hospital Stay: Payer: Medicare Other

## 2022-05-25 ENCOUNTER — Inpatient Hospital Stay: Payer: Medicare Other | Attending: Oncology

## 2022-05-25 VITALS — BP 149/83 | HR 94 | Temp 97.8°F | Resp 18

## 2022-05-25 DIAGNOSIS — Z79899 Other long term (current) drug therapy: Secondary | ICD-10-CM | POA: Insufficient documentation

## 2022-05-25 DIAGNOSIS — E538 Deficiency of other specified B group vitamins: Secondary | ICD-10-CM | POA: Insufficient documentation

## 2022-05-25 DIAGNOSIS — C182 Malignant neoplasm of ascending colon: Secondary | ICD-10-CM

## 2022-05-25 DIAGNOSIS — Z85038 Personal history of other malignant neoplasm of large intestine: Secondary | ICD-10-CM | POA: Diagnosis not present

## 2022-05-25 DIAGNOSIS — Z452 Encounter for adjustment and management of vascular access device: Secondary | ICD-10-CM | POA: Diagnosis not present

## 2022-05-25 DIAGNOSIS — Z95828 Presence of other vascular implants and grafts: Secondary | ICD-10-CM

## 2022-05-25 LAB — BASIC METABOLIC PANEL - CANCER CENTER ONLY
Anion gap: 12 (ref 5–15)
BUN: 16 mg/dL (ref 8–23)
CO2: 26 mmol/L (ref 22–32)
Calcium: 9.9 mg/dL (ref 8.9–10.3)
Chloride: 103 mmol/L (ref 98–111)
Creatinine: 0.87 mg/dL (ref 0.44–1.00)
GFR, Estimated: >60 mL/min (ref >60)
Glucose, Bld: 139 mg/dL — ABNORMAL HIGH (ref 70–99)
Potassium: 4 mmol/L (ref 3.5–5.1)
Sodium: 141 mmol/L (ref 135–145)

## 2022-05-25 LAB — CEA (ACCESS): CEA (CHCC): 1.78 ng/mL (ref 0.00–5.00)

## 2022-05-25 MED ORDER — HEPARIN SOD (PORK) LOCK FLUSH 100 UNIT/ML IV SOLN
250.0000 [IU] | Freq: Once | INTRAVENOUS | Status: AC
  Administered 2022-05-25: 250 [IU]

## 2022-05-25 MED ORDER — ALTEPLASE 2 MG IJ SOLR: 2.0000 mg | Freq: Once | INTRAMUSCULAR | Status: DC

## 2022-05-25 MED ORDER — SODIUM CHLORIDE 0.9% FLUSH
10.0000 mL | Freq: Once | INTRAVENOUS | Status: AC
  Administered 2022-05-25: 10 mL

## 2022-05-25 NOTE — Progress Notes (Signed)
Patient seen for port flush.  Blood return was not obtained during port flush.  Patient given the option to have cathflo instilled in port today, but patient declined.  Scheduling message sent to have patient scheduled for port flush in January 2024 to have cathflo instilled in port. Labwork was drawn peripherally.

## 2022-05-25 NOTE — Patient Instructions (Signed)

## 2022-06-09 ENCOUNTER — Encounter: Payer: Self-pay | Admitting: Oncology

## 2022-06-11 ENCOUNTER — Encounter: Payer: Self-pay | Admitting: Oncology

## 2022-06-12 ENCOUNTER — Inpatient Hospital Stay: Payer: Medicare Other | Attending: Oncology

## 2022-06-12 VITALS — BP 145/75 | HR 89 | Temp 98.6°F | Resp 20

## 2022-06-12 DIAGNOSIS — Z95828 Presence of other vascular implants and grafts: Secondary | ICD-10-CM

## 2022-06-12 DIAGNOSIS — Z452 Encounter for adjustment and management of vascular access device: Secondary | ICD-10-CM | POA: Insufficient documentation

## 2022-06-12 DIAGNOSIS — Z85038 Personal history of other malignant neoplasm of large intestine: Secondary | ICD-10-CM | POA: Insufficient documentation

## 2022-06-12 MED ORDER — ALTEPLASE 2 MG IJ SOLR
2.0000 mg | Freq: Once | INTRAMUSCULAR | Status: AC | PRN
  Administered 2022-06-12: 2 mg
  Filled 2022-06-12: qty 2

## 2022-06-12 MED ORDER — HEPARIN SOD (PORK) LOCK FLUSH 100 UNIT/ML IV SOLN
500.0000 [IU] | INTRAVENOUS | Status: AC | PRN
  Administered 2022-06-12: 500 [IU]

## 2022-06-12 MED ORDER — SODIUM CHLORIDE 0.9% FLUSH
10.0000 mL | INTRAVENOUS | Status: AC | PRN
  Administered 2022-06-12: 10 mL

## 2022-06-12 NOTE — Patient Instructions (Signed)
Alteplase Solution for Injection What is this medication? ALTEPLASE (AL te plase) can dissolve blood clots that form in the heart, blood vessels, or lungs after a heart attack. This medicine is also given to improve recovery and decrease the chance of disability in patients having symptoms of a stroke. This medicine may be used for other purposes; ask your health care provider or pharmacist if you have questions. This medicine may be used for other purposes; ask your health care provider or pharmacist if you have questions. COMMON BRAND NAME(S): Activase What should I tell my care team before I take this medication? They need to know if you have any of these conditions: aneurysm bleeding problems or problems with blood clotting blood vessel disease or damaged blood vessels diabetic retinopathy head injury or tumor high blood pressure infection irregular heartbeats previous stroke recent biopsy or surgery an unusual or allergic reaction to alteplase, other medicines, foods, dyes, or preservatives pregnant or trying to get pregnant breast-feeding How should I use this medication? This medicine is for injection into a vein. It is given by a health care professional in a hospital or clinic setting. Talk to your pediatrician regarding the use of this medicine in children. Special care may be needed. Overdosage: If you think you have taken too much of this medicine contact a poison control center or emergency room at once. NOTE: This medicine is only for you. Do not share this medicine with others. Overdosage: If you think you have taken too much of this medicine contact a poison control center or emergency room at once. NOTE: This medicine is only for you. Do not share this medicine with others. What if I miss a dose? This does not apply. What may interact with this medication? Do not take this medicine with any of the following medications: aminocaproic acid aprotinin tranexamic  acid This medicine may also interact with the following medications: antiinflammatory drugs, NSAIDs like ibuprofen aspirin and aspirin-like medicines blood thinners, like warfarin, heparin or enoxaparin dipyridamole This list may not describe all possible interactions. Give your health care provider a list of all the medicines, herbs, non-prescription drugs, or dietary supplements you use. Also tell them if you smoke, drink alcohol, or use illegal drugs. Some items may interact with your medicine. This list may not describe all possible interactions. Give your health care provider a list of all the medicines, herbs, non-prescription drugs, or dietary supplements you use. Also tell them if you smoke, drink alcohol, or use illegal drugs. Some items may interact with your medicine. What should I watch for while using this medication? Your condition will be monitored carefully while you are receiving this medicine. Follow the advice of your doctor or health care professional exactly. You may need bed rest to minimize the risk of bleeding. This medicine can make you bleed more easily. This effect can last for several days. Take special care brushing or flossing your teeth. Do not take aspirin, ibuprofen, or other nonprescription pain relievers during or for several days after alteplase treatment unless otherwise instructed by your doctor or health care professional. You may feel dizzy or lightheaded. To avoid the risk of dizzy or fainting spells, sit or stand up slowly, especially if you are an older patient. What side effects may I notice from receiving this medication? Side effects that you should report to your doctor or health care professional as soon as possible: allergic reactions like skin rash, itching or hives, swelling of the face, lips, or tongue signs  and symptoms of bleeding such as bloody or black, tarry stools; red or dark-brown urine; spitting up blood or brown material that looks like  coffee grounds; red spots on the skin; unusual bruising or bleeding from the eye, gums, or nose signs and symptoms of a stroke such as changes in vision; confusion; trouble speaking or understanding; severe headaches; sudden numbness or weakness of the face, arm, or leg; trouble walking; dizziness; loss of balance or coordination slow or fast heart rate Side effects that usually do not require medical attention (report to your doctor or health care professional if they continue or are bothersome): dizziness fever nausea, vomiting This list may not describe all possible side effects. Call your doctor for medical advice about side effects. You may report side effects to FDA at 1-800-FDA-1088. This list may not describe all possible side effects. Call your doctor for medical advice about side effects. You may report side effects to FDA at 1-800-FDA-1088. Where should I keep my medication? This does not apply. You will not be given this medicine to store at home. NOTE: This sheet is a summary. It may not cover all possible information. If you have questions about this medicine, talk to your doctor, pharmacist, or health care provider.  2023 Elsevier/Gold Standard (2004-09-15 00:00:00)

## 2022-06-12 NOTE — Progress Notes (Signed)
Dr. Benay Spice made aware of lack of blood return after cathflo administration.  Per Dr. Benay Spice, patient has been advised previously to have port-a-cath removed as she is almost 4 years out from her initial diagnosis. Patient informed of Dr. Gearldine Shown advisement and verbalized understanding. Patient stated she would like to talk to her husband first before she agrees to schedule the procedure.  Informed patient our office would follow-up with her next week to see what her decision is.   Per Dr. Benay Spice, ok to deaccess port without obtaining blood return.

## 2022-06-15 ENCOUNTER — Other Ambulatory Visit: Payer: Self-pay

## 2022-06-15 ENCOUNTER — Other Ambulatory Visit: Payer: Self-pay | Admitting: Nurse Practitioner

## 2022-06-15 ENCOUNTER — Telehealth: Payer: Self-pay

## 2022-06-15 DIAGNOSIS — Z95828 Presence of other vascular implants and grafts: Secondary | ICD-10-CM

## 2022-06-15 NOTE — Telephone Encounter (Signed)
[name] called in to let us know she agree to have her port removal. I will pass this information to the provider.

## 2022-06-18 ENCOUNTER — Other Ambulatory Visit: Payer: Self-pay | Admitting: Physician Assistant

## 2022-06-29 ENCOUNTER — Ambulatory Visit (HOSPITAL_COMMUNITY)
Admission: RE | Admit: 2022-06-29 | Discharge: 2022-06-29 | Disposition: A | Discharge status: Home / Self Care | Payer: Medicare Other | Source: Ambulatory Visit | Attending: Nurse Practitioner | Admitting: Nurse Practitioner

## 2022-06-29 DIAGNOSIS — C189 Malignant neoplasm of colon, unspecified: Secondary | ICD-10-CM | POA: Insufficient documentation

## 2022-06-29 DIAGNOSIS — Z452 Encounter for adjustment and management of vascular access device: Secondary | ICD-10-CM | POA: Diagnosis not present

## 2022-06-29 DIAGNOSIS — Z95828 Presence of other vascular implants and grafts: Secondary | ICD-10-CM

## 2022-06-29 HISTORY — PX: IR REMOVAL TUN ACCESS W/ PORT W/O FL MOD SED: IMG2290

## 2022-06-29 MED ORDER — LIDOCAINE-EPINEPHRINE 1 %-1:100000 IJ SOLN
INTRAMUSCULAR | Status: AC
  Administered 2022-06-29: 7 mL via INTRADERMAL
  Filled 2022-06-29: qty 1

## 2022-06-29 NOTE — H&P (Signed)
Chief Complaint: Port Malfunction  Referring Physician(s): Betsy Coder  Supervising Physician: Michaelle Birks  Patient Status: [facility] - Out-pt  History of Present Illness: [name] is a 77 y.o. female who has undergone chemotherapy for colon cancer.  She is in clinical remission and is here today for removal of her Port A cath. It apparently is not working anyway.  She is NPO. No nausea/vomiting. No Fever/chills. ROS negative.   Past Medical History:  Diagnosis Date   Cancer of right colon ([place])    Constipation    CVA (cerebral infarction) 1996   Dyspnea    going up stairs and hills   Family history of colon cancer    GERD (gastroesophageal reflux disease)    History of cardiomegaly    Hyperlipidemia    Hypertension    Iron deficiency anemia    NSTEMI (non-ST elevated myocardial infarction) ([place]) 2017   Personal history of colonic polyps    PONV (postoperative nausea and vomiting)    Right shoulder pain    more painful at night , cold makes it worse   Severe obesity ([place])     Past Surgical History:  Procedure Laterality Date   ABDOMINAL HYSTERECTOMY     BACK SURGERY     x2   CARDIAC CATHETERIZATION N/A 02/28/2016   Procedure: Left Heart Cath and Coronary Angiography;  Surgeon: Peter M Martinique, MD;  Location: [place] [facility];  Service: Cardiovascular;  Laterality: N/A;   CARDIAC CATHETERIZATION N/A 02/28/2016   Procedure: Coronary Stent Intervention;  Surgeon: Peter M Martinique, MD;  Location: [place] [facility];  Service: Cardiovascular;  Laterality: N/A;   CHOLECYSTECTOMY     COLONOSCOPY  11/24/2018   CORONARY ANGIOPLASTY     CORONARY STENT INTERVENTION N/A 06/10/2021   Procedure: CORONARY STENT INTERVENTION;  Surgeon: Belva Crome, MD;  Location: [place] [facility];  Service: Cardiovascular;  Laterality: N/A;   CYST EXCISION Left    Cheek   LAPAROSCOPIC RIGHT HEMI COLECTOMY Right 01/17/2019   Procedure: LAPAROSCOPIC RIGHT HEMI COLECTOMY;   Surgeon: Alphonsa Overall, MD;  Location: [facility];  Service: General;  Laterality: Right;   LEFT HEART CATH AND CORONARY ANGIOGRAPHY N/A 06/10/2021   Procedure: LEFT HEART CATH AND CORONARY ANGIOGRAPHY;  Surgeon: Belva Crome, MD;  Location: [place] [facility];  Service: Cardiovascular;  Laterality: N/A;   NASAL FRACTURE SURGERY     PORTACATH PLACEMENT N/A 02/22/2019   Procedure: POWER PORT PLACEMENT;  Surgeon: Alphonsa Overall, MD;  Location: [place];  Service: General;  Laterality: N/A;   UPPER GI ENDOSCOPY  11/24/2018    Allergies: Guaifenesin er, Lotensin [benazepril hcl], and Propoxyphene  Medications: Prior to Admission medications   Medication Sig Start Date End Date Taking? Authorizing Provider  acetaminophen (TYLENOL) 650 MG CR tablet Take 650 mg by mouth every 8 (eight) hours as needed for pain.    [provider]  amLODipine (NORVASC) 10 MG tablet TAKE 1 TABLET BY MOUTH DAILY 06/18/22   Martinique, Peter M, MD  amLODipine (NORVASC) 5 MG tablet Take 1 tablet (5 mg total) by mouth daily. 04/15/22   Martinique, Peter M, MD  ASPIRIN 81 PO Take 81 mg by mouth daily.    [provider]  Evolocumab (REPATHA SURECLICK) 852 MG/ML SOAJ Inject 140 mg into the skin every 14 (fourteen) days. 08/05/21   Almyra Deforest, PA  ezetimibe (ZETIA) 10 MG tablet TAKE 1 TABLET(10 MG) BY MOUTH DAILY 04/06/22   Martinique,  Ander Slade, MD  hydrochlorothiazide (HYDRODIURIL) 25 MG tablet Take 25 mg by mouth daily. 04/04/22   [provider]  lidocaine-prilocaine (EMLA) cream Apply 1 application. topically as needed. Apply 1/2 tablespoon to port 2 hours prior to stick and cover with plastic wrap to numb site as needed 10/03/21   Ladell Pier, MD  loperamide (IMODIUM) 2 MG capsule Take 2-4 mg by mouth 4 (four) times daily as needed for diarrhea or loose stools.    [provider]  metoprolol succinate (TOPROL-XL) 25 MG 24 hr tablet TAKE 1/2 TABLET BY MOUTH EVERY DAY 01/13/22   Martinique, Peter M, MD   nitroGLYCERIN (NITROSTAT) 0.4 MG SL tablet Place 1 tablet (0.4 mg total) under the tongue every 5 (five) minutes x 3 doses as needed for chest pain. 06/12/21   Almyra Deforest, PA  nystatin cream (MYCOSTATIN) Apply 1 application topically 2 (two) times daily as needed (rash).    [provider]  omeprazole (PRILOSEC) 40 MG capsule Take 40 mg by mouth daily. 12/06/19   [provider]  ticagrelor (BRILINTA) 90 MG TABS tablet Take 1 tablet (90 mg total) by mouth 2 (two) times daily. 06/12/21   Almyra Deforest, PA  valsartan (DIOVAN) 160 MG tablet Take 160 mg by mouth daily. 12/27/20   [provider]  vitamin B-12 (CYANOCOBALAMIN) 1000 MCG tablet Take 1,000 mcg by mouth daily. 12/08/19   Ladell Pier, MD  VITAMIN D PO Take 1 tablet by mouth daily.    [provider]  VITAMIN E PO Take 1 tablet by mouth daily.    [provider]     Family History  Problem Relation Age of Onset   Esophageal cancer Sister 12   Lung cancer Brother 35   CAD Daughter    Breast cancer Maternal Aunt        dx over 87   Colon polyps Sister        total of 75 polyps   Colon cancer Sister 84   Breast cancer Cousin        dx in her 99s; mat first cousin    Social History   Socioeconomic History   Marital status: Married    Spouse name: Not on file   Number of children: Not on file   Years of education: Not on file   Highest education level: Not on file  Occupational History   Not on file  Tobacco Use   Smoking status: Never   Smokeless tobacco: Never  Vaping Use   Vaping Use: Never used  Substance and Sexual Activity   Alcohol use: No   Drug use: No   Sexual activity: Not on file  Other Topics Concern   Not on file  Social History Narrative   Not on file   Social Determinants of Health   Financial Resource Strain: Not on file  Food Insecurity: Not on file  Transportation Needs: Not on file  Physical Activity: Not on file  Stress: Not on file  Social  Connections: Not on file     Review of Systems: A 12 point ROS discussed and pertinent positives are indicated in the HPI above.  All other systems are negative.  Review of Systems  Vital Signs: There were no vitals taken for this visit.  Physical Exam Constitutional:      Appearance: Normal appearance.  HENT:     Head: Normocephalic and atraumatic.  Eyes:     Extraocular Movements: Extraocular movements intact.  Cardiovascular:     Rate and Rhythm: Normal rate and regular rhythm.  Pulmonary:     Effort: Pulmonary effort is normal. No respiratory distress.     Breath sounds: Normal breath sounds.  Abdominal:     Palpations: Abdomen is soft.  Musculoskeletal:        General: Normal range of motion.     Cervical back: Normal range of motion.  Skin:    General: Skin is warm and dry.  Neurological:     General: No focal deficit present.     Mental Status: She is alert and oriented to person, place, and time.  Psychiatric:        Mood and Affect: Mood normal.        Behavior: Behavior normal.        Thought Content: Thought content normal.        Judgment: Judgment normal.     Imaging: No results found.  Labs:  CBC: Recent Labs    04/02/22 1125  WBC 8.1  HGB 13.1  HCT 40.9  PLT 322    COAGS: No results for input(s): "INR", "APTT" in the last 8760 hours.  BMP: Recent Labs    08/08/21 1050 10/14/21 1105 04/02/22 1125 05/25/22 1209  NA 139 136 142 141  K 3.4* 3.7 4.2 4.0  CL 104 102 102 103  CO2 '25 24 25 26  '$ GLUCOSE 114* 113* 99 139*  BUN '13 12 14 16  '$ CALCIUM 9.9 10.0 9.9 9.9  CREATININE 1.04* 0.99 1.01* 0.87  GFRNONAA 56* 59*  --  >60    LIVER FUNCTION TESTS: Recent Labs    04/02/22 1125  BILITOT 0.3  AST 14  ALT 13  ALKPHOS 125*  PROT 7.2  ALBUMIN 4.2    TUMOR MARKERS: Recent Labs    10/14/21 1105 03/30/22 1134 05/25/22 1209  CEA 2.00 1.83 1.78    Assessment and Plan:  Clinical remission from colon cancer with  malfunctioning Port A Cath.  Initially placed by Dr. Keturah Barre. Lucia Gaskins 02/22/2019.  Will proceed with removal of the port today by Dr. Maryelizabeth Kaufmann.  Risks and benefits of image guided port-a-catheter removal was discussed with the patient including, but not limited to bleeding, infection, pneumothorax, or fibrin sheath development and need for additional procedures.  All of the patient's questions were answered, patient is agreeable to proceed. Consent signed and in chart.  Thank you for allowing our service to participate in [place] 's care.  Electronically Signed: Murrell Redden, PA-C   06/29/2022, 3:23 PM      I spent a total of  15 Minutes  in face to face in clinical consultation, greater than 50% of which was counseling/coordinating care for [facility] removal.

## 2022-06-29 NOTE — Procedures (Signed)
Interventional Radiology Procedure Note  PROCEDURE SUMMARY:  Successful removal of tunneled catheter.  No complications.   EBL = trace  Please see full dictation in imaging section of Epic for procedure details.  [place]

## 2022-07-06 ENCOUNTER — Other Ambulatory Visit: Payer: Self-pay | Admitting: Physician Assistant

## 2022-07-06 DIAGNOSIS — I2510 Atherosclerotic heart disease of native coronary artery without angina pectoris: Secondary | ICD-10-CM

## 2022-07-06 DIAGNOSIS — E785 Hyperlipidemia, unspecified: Secondary | ICD-10-CM

## 2022-07-06 DIAGNOSIS — I214 Non-ST elevation (NSTEMI) myocardial infarction: Secondary | ICD-10-CM

## 2022-07-10 ENCOUNTER — Other Ambulatory Visit: Payer: Self-pay | Admitting: Cardiology

## 2022-07-10 ENCOUNTER — Other Ambulatory Visit: Payer: Self-pay | Admitting: Physician Assistant

## 2022-07-10 ENCOUNTER — Telehealth: Payer: Self-pay | Admitting: *Deleted

## 2022-07-10 NOTE — Telephone Encounter (Signed)
Called [name] to cancel her 2/5 appointments since she no longer has a port. CEA lab and OV are needed on 4/8 as scheduled. She is aware.

## 2022-07-13 ENCOUNTER — Inpatient Hospital Stay: Payer: Medicare Other

## 2022-07-13 NOTE — Telephone Encounter (Signed)
Previous encounter visit summary states patient should stop Brilinta after Jan 4.

## 2022-07-13 NOTE — Telephone Encounter (Signed)
Medication already refilled.

## 2022-07-15 ENCOUNTER — Other Ambulatory Visit: Payer: Self-pay

## 2022-07-15 MED ORDER — TICAGRELOR 90 MG PO TABS: 90.0000 mg | ORAL_TABLET | Freq: Two times a day (BID) | ORAL | 9 refills | Status: DC

## 2022-08-13 ENCOUNTER — Telehealth: Payer: Self-pay

## 2022-08-13 ENCOUNTER — Other Ambulatory Visit (HOSPITAL_COMMUNITY): Payer: Self-pay

## 2022-08-13 NOTE — Telephone Encounter (Addendum)
Pharmacy Patient Advocate Encounter   Received notification from [facility] that prior authorization for REPATHA 140 MG/ML INJ is needed.    PA submitted on 08/13/22 VIA BLUE E  Status is pending  Karie Soda, [place] Patient Advocate Specialist Direct Number: [phone] Fax: [phone]

## 2022-08-14 NOTE — Telephone Encounter (Signed)
Pharmacy Patient Advocate Encounter  Prior Authorization for REPATHA 140 MG/ML INJ  has been approved.    Effective dates: 08/13/22 through 08/13/23  Karie Soda, [place] Patient Advocate Specialist Direct Number: [phone] Fax: [phone]

## 2022-09-14 ENCOUNTER — Other Ambulatory Visit: Payer: Medicare Other

## 2022-09-14 ENCOUNTER — Inpatient Hospital Stay: Payer: Medicare Other

## 2022-09-14 ENCOUNTER — Inpatient Hospital Stay: Payer: Medicare Other | Attending: Oncology | Admitting: Nurse Practitioner

## 2022-09-14 ENCOUNTER — Encounter: Payer: Self-pay | Admitting: Nurse Practitioner

## 2022-09-14 VITALS — BP 124/80 | HR 91 | Temp 98.2°F | Resp 18 | Ht 62.0 in | Wt 202.0 lb

## 2022-09-14 DIAGNOSIS — E041 Nontoxic single thyroid nodule: Secondary | ICD-10-CM | POA: Insufficient documentation

## 2022-09-14 DIAGNOSIS — Z8673 Personal history of transient ischemic attack (TIA), and cerebral infarction without residual deficits: Secondary | ICD-10-CM | POA: Diagnosis not present

## 2022-09-14 DIAGNOSIS — C182 Malignant neoplasm of ascending colon: Secondary | ICD-10-CM

## 2022-09-14 DIAGNOSIS — Z9221 Personal history of antineoplastic chemotherapy: Secondary | ICD-10-CM | POA: Insufficient documentation

## 2022-09-14 DIAGNOSIS — E538 Deficiency of other specified B group vitamins: Secondary | ICD-10-CM | POA: Diagnosis not present

## 2022-09-14 DIAGNOSIS — I10 Essential (primary) hypertension: Secondary | ICD-10-CM | POA: Diagnosis not present

## 2022-09-14 DIAGNOSIS — Z85038 Personal history of other malignant neoplasm of large intestine: Secondary | ICD-10-CM | POA: Insufficient documentation

## 2022-09-14 DIAGNOSIS — I252 Old myocardial infarction: Secondary | ICD-10-CM | POA: Insufficient documentation

## 2022-09-14 LAB — CEA (ACCESS): CEA (CHCC): 2.01 ng/mL (ref 0.00–5.00)

## 2022-09-14 NOTE — Progress Notes (Signed)
[facility] OFFICE PROGRESS NOTE   Diagnosis: Colon cancer  INTERVAL HISTORY:   [name] returns as scheduled.  Overall feeling well.  No change in bowel habits.  She has periodic constipation.  No rectal bleeding.  Good appetite.  Objective:  Vital signs in last 24 hours:  Blood pressure 124/80, pulse 91, temperature 98.2 F (36.8 C), temperature source Oral, resp. rate 18, height 5\' 2"  (1.575 m), weight 202 lb (91.6 kg), SpO2 100 %.    Lymphatics: No palpable cervical, supraclavicular, axillary or inguinal lymph nodes. Resp: Lungs clear bilaterally. Cardio: Regular rate and rhythm. GI: Abdomen soft and nontender.  No hepatosplenomegaly. Vascular: No leg edema. Neuro: Alert and oriented.   Lab Results:  Lab Results  Component Value Date   WBC 8.1 04/02/2022   HGB 13.1 04/02/2022   HCT 40.9 04/02/2022   MCV 91 04/02/2022   PLT 322 04/02/2022   NEUTROABS 5.4 04/02/2022    Imaging:  No results found.  Medications: I have reviewed the patient's current medications.  Assessment/Plan: Colon cancer (T3, N1b) Colonoscopy 11/24/2018-large 5 cm malignant appearing fungating mass encompassing the ileocecal valve was identified.  Pathology showed invasive adenocarcinoma, moderately differentiated.   CT abdomen/pelvis 12/07/2018-eccentric wall thickening in the cecum, around the ileocecal valve, compatible with findings reported at the recent colonoscopy.  Borderline enlarged adjacent lymph nodes in the ileocolic mesentery.  No evidence for liver metastases.   CEA 01/13/2019-12.7 CEA 02/23/19 postop/at start of adjuvant chemo - 1.83 01/17/2019 right hemicolectomy by Dr. Ezzard Standing.  Final pathology showed invasive colonic adenocarcinoma, 3.3 cm; tumor invades through the muscularis propria into pericolonic tissue; margins not involved; metastatic carcinoma present in 2 of 18 lymph nodes; lymphovascular invasion present; perineural invasion not identified; mismatch  repair protein (IHC) normal; MSI stable. Declined genetic testing on 02/20/19, patient indicated she may reconsider after completing adjuvant treatment. Seen by Maylon Cos CT chest 02/17/2019-tiny bilateral pulmonary nodules felt to be unlikely related to metastatic disease PAC placement per Dr. Ezzard Standing 02/22/2019; Port-A-Cath removed 06/29/2022 Cycle 1 adjuvant FOLFOX 02/23/19  Cycle 2 adjuvant FOLFOX 03/09/2019 Cycle 3 adjuvant FOLFOX 03/23/2019 (oxaliplatin dose reduced due to low normal neutrophil count) Cycle 4 adjuvant FOLFOX 04/06/2019 Cycle 5 adjuvant FOLFOX 05/01/2019 Cycle 6 adjuvant FOLFOX 05/15/2019 (oxaliplatin held due to neuropathy) CT 12/04/2019-stable right thyroid nodule, no evidence of metastatic disease, stable bilateral lung nodules CTs 10/15/2020- no evidence of metastatic disease chest, abdomen, pelvis.  Occasional tiny pulmonary nodules stable.  Interval enlargement of a nodule right lobe of the thyroid. CTs 10/14/2021-no metastatic disease in the chest, abdomen or pelvis.  Solitary tiny right lower lobe pulmonary nodule stable.   History of iron deficiency anemia Hypertension History of MI status post stent placement History of stroke 1996 Oxaliplatin neuropathy Anorexia/weight loss Report of balance difficulty B 12 deficiency-B12 weekly x4 beginning 12/09/2019.  On oral B12. Right thyroid nodule on CT 10/15/2020.  Referred for thyroid ultrasound.  Thyroid ultrasound 10/31/2020-3.3 cm right inferior TR 3 nodule meets criteria for biopsy, this correlates with the chest CT finding.  Additional 1.5 cm right superior T3 nodule meets criteria for follow-up in 1 year.  FNA biopsy of right inferior thyroid lesion 11/12/2020-scant follicular epithelium present.  Nondiagnostic material.    Disposition: [name] remains in clinical remission from colon cancer.  She is now close to 4 years out from diagnosis.  CEA is stable in normal range.  She will return for a CEA and follow-up visit in  6 months.  She understands our recommendation to remain up-to-date on colonoscopy.  She will contact her gastroenterologist to schedule an appointment.    Lonna Cobb ANP/GNP-BC   09/14/2022  12:11 PM

## 2022-10-06 NOTE — Progress Notes (Signed)
d Cardiology Office Note:    Date:  04/02/2022   ID:  [name], [name] [birth year], MRN [id number]  PCP:  Eartha Inch, MD   [facility] Providers Cardiologist:  Jakyiah Briones [place], MD     Referring MD: Eartha Inch, MD   Chief Complaint  Patient presents with   Follow-up   Coronary Artery Disease    History of Present Illness:    [name] is a 77 y.o. female with a hx of CAD, hypertension, hyperlipidemia, obesity, colon cancer s/p right hemicolectomy, and CVA.  Patient was  admitted to the hospital on 06/09/2021 with chest discomfort.  She initially went [facility] ED, blood work showed borderline elevated troponin.  She was taken to the Cath Lab on 06/11/2021.  Cardiac catheterization revealed a 95% mid RCA lesion that was heavily calcified and diffusely diseased, this was treated with a Synergy 3.5 x 24 mm DES postdilated to 3.75 mm in diameter.  There were at least 2 spots within the stented segment that was not fully expanded and had a 35% stenosis, therefore the patient was consider having higher than normal risk for restenosis.  EF was 55%.  Postprocedure, she was placed on aspirin to Brilinta with recommendation to continue dual antiplatelet therapy for at least 1 year.  Echocardiogram showed EF 60 to 65%, mild LVH, grade 1 DD, trivial MR, dilated aortic root at 37 mm.  She was subsequently seen in lipid clinic and started on Repatha.LDL dropped to 40.   Patient presents today for follow-up.  She states she has no significant chest pain. She has had her Port a Cath removed. She has some discomfort in her lateral breast area. She notes problems with her balance and walks with a walker. Tires easily. No edema or SOB.    Past Medical History:  Diagnosis Date   Cancer of right colon ([facility])    Constipation    CVA (cerebral infarction) 1996   Dyspnea    going up stairs and hills   Family history of colon cancer    GERD (gastroesophageal reflux  disease)    History of cardiomegaly    Hyperlipidemia    Hypertension    Iron deficiency anemia    NSTEMI (non-ST elevated myocardial infarction) ([facility]) 2017   Personal history of colonic polyps    PONV (postoperative nausea and vomiting)    Right shoulder pain    more painful at night , cold makes it worse   Severe obesity ([facility])     Past Surgical History:  Procedure Laterality Date   ABDOMINAL HYSTERECTOMY     BACK SURGERY     x2   CARDIAC CATHETERIZATION N/A 02/28/2016   Procedure: Left Heart Cath and Coronary Angiography;  Surgeon: Amdrew Oboyle M Swazil[place], MD;  Location: [facility] INVASIVE CV LAB;  Service: Cardiovascular;  Laterality: N/A;   CARDIAC CATHETERIZATION N/A 02/28/2016   Procedure: Coronary Stent Intervention;  Surgeon: Montoya Watkin M Sw[place]il[place], MD;  Location: [facility] INVASIVE CV LAB;  Service: Cardiovascular;  Laterality: N/A;   CHOLECYSTECTOMY     COLONOSCOPY  11/24/2018   CORONARY ANGIOPLASTY     CORONARY STENT INTERVENTION N/A 06/10/2021   Procedure: CORONARY STENT INTERVENTION;  Surgeon: Lyn Records, MD;  Location: [facility] INVASIVE CV LAB;  Service: Cardiovascular;  Laterality: N/A;   CYST EXCISION Left    Cheek   LAPAROSCOPIC RIGHT HEMI COLECTOMY Right 01/17/2019   Procedure: LAPAROSCOPIC RIGHT HEMI COLECTOMY;  Surgeon: Ovidio Kin, MD;  Location: [facility];  Service: General;  Laterality: Right;   LEFT HEART CATH AND CORONARY ANGIOGRAPHY N/A 06/10/2021   Procedure: LEFT HEART CATH AND CORONARY ANGIOGRAPHY;  Surgeon: Lyn Records, MD;  Location: [facility] INVASIVE CV LAB;  Service: Cardiovascular;  Laterality: N/A;   NASAL FRACTURE SURGERY     PORTACATH PLACEMENT N/A 02/22/2019   Procedure: POWER PORT PLACEMENT;  Surgeon: Ovidio Kin, MD;  Location: [facility] OR;  Service: General;  Laterality: N/A;   UPPER GI ENDOSCOPY  11/24/2018    Current Medications: Current Meds  Medication Sig   acetaminophen (TYLENOL) 650 MG CR tablet Take 650 mg by mouth every 8 (eight) hours as needed for pain.   amLODipine  (NORVASC) 10 MG tablet Take 10 mg by mouth daily.   ASPIRIN 81 PO Take 81 mg by mouth daily.   Evolocumab (REPATHA SURECLICK) 140 MG/ML SOAJ Inject 140 mg into the skin every 14 (fourteen) days.   ezetimibe (ZETIA) 10 MG tablet TAKE 1 TABLET(10 MG) BY MOUTH DAILY   lidocaine-prilocaine (EMLA) cream Apply 1 application. topically as needed. Apply 1/2 tablespoon to port 2 hours prior to stick and cover with plastic wrap to numb site as needed   loperamide (IMODIUM) 2 MG capsule Take 2-4 mg by mouth 4 (four) times daily as needed for diarrhea or loose stools.   metoprolol succinate (TOPROL-XL) 25 MG 24 hr tablet TAKE 1/2 TABLET BY MOUTH EVERY DAY   nitroGLYCERIN (NITROSTAT) 0.4 MG SL tablet Place 1 tablet (0.4 mg total) under the tongue every 5 (five) minutes x 3 doses as needed for chest pain.   nystatin cream (MYCOSTATIN) Apply 1 application topically 2 (two) times daily as needed (rash).   omeprazole (PRILOSEC) 40 MG capsule Take 40 mg by mouth daily.   ticagrelor (BRILINTA) 90 MG TABS tablet Take 1 tablet (90 mg total) by mouth 2 (two) times daily.   valsartan (DIOVAN) 160 MG tablet Take 160 mg by mouth daily.   vitamin B-12 (CYANOCOBALAMIN) 1000 MCG tablet Take 1,000 mcg by mouth daily.   VITAMIN D PO Take 1 tablet by mouth daily.   VITAMIN E PO Take 1 tablet by mouth daily.     Allergies:   Guaifenesin er, Lotensin [benazepril hcl], and Propoxyphene   Social History   Socioeconomic History   Marital status: Married    Spouse name: Not on file   Number of children: Not on file   Years of education: Not on file   Highest education level: Not on file  Occupational History   Not on file  Tobacco Use   Smoking status: Never   Smokeless tobacco: Never  Vaping Use   Vaping Use: Never used  Substance and Sexual Activity   Alcohol use: No   Drug use: No   Sexual activity: Not on file  Other Topics Concern   Not on file  Social History Narrative   Not on file   Social Determinants  of Health   Financial Resource Strain: Not on file  Food Insecurity: Not on file  Transportation Needs: Not on file  Physical Activity: Not on file  Stress: Not on file  Social Connections: Not on file     Family History: The patient's family history includes Breast cancer in her cousin and maternal aunt; CAD in her daughter; Colon cancer (age of onset: 26) in her sister; Colon polyps in her sister; Esophageal cancer (age of onset: 57) in her sister; Lung cancer (age of onset: 62) in her brother.  ROS:  Please see the history of present illness.     All other systems reviewed and are negative.  EKGs/Labs/Other Studies Reviewed:    The following studies were reviewed today:  Echo 06/11/2021 1. Difficult study due to poor echo windows.   2. Left ventricular ejection fraction, by estimation, is 60 to 65%. The  left ventricle has normal function. The left ventricle has no regional  wall motion abnormalities. There is mild left ventricular hypertrophy.  Left ventricular diastolic parameters  are consistent with Grade I diastolic dysfunction (impaired relaxation).   3. Right ventricular systolic function is normal. The right ventricular  size is normal.   4. The mitral valve is grossly normal. Trivial mitral valve  regurgitation.   5. The aortic valve is tricuspid. There is mild thickening of the aortic  valve. Aortic valve regurgitation is not visualized. Aortic valve  sclerosis is present, with no evidence of aortic valve stenosis.   6. Aortic dilatation noted. 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   Comparison(s): No prior Echocardiogram.   EKG:  EKG is ordered today.  NSR rate 90, LAD, LVH, possible old anterior infarct. I have personally reviewed and interpreted this study.   Recent Labs: 06/09/2021: TSH 3.281 06/10/2021: ALT 18 06/12/2021: Hemoglobin 12.3; Platelets 246 10/14/2021: BUN 12; Creatinine 0.99; Potassium 3.7; Sodium 136  Recent Lipid  Panel    Component Value Date/Time   CHOL 150 06/10/2021 0439   TRIG 69 06/10/2021 0439   HDL 47 06/10/2021 0439   CHOLHDL 3.2 06/10/2021 0439   VLDL 14 06/10/2021 0439   LDLCALC 89 06/10/2021 0439     Risk Assessment/Calculations:           Physical Exam:    VS:  BP 122/84 (BP Location: Left Arm, Patient Position: Sitting, Cuff Size: Large)   Pulse 71   Ht 5\' 2"  (1.575 m)   Wt 194 lb 9.6 oz (88.3 kg)   SpO2 99%   BMI 35.59 kg/m     Wt Readings from Last 3 Encounters:  04/02/22 194 lb 9.6 oz (88.3 kg)  03/30/22 194 lb (88 kg)  01/08/22 198 lb (89.8 kg)     GEN:  Well nourished, obese,  in no acute distress HEENT: Normal NECK: No JVD; No carotid bruits LYMPHATICS: No lymphadenopathy CARDIAC: RRR, no murmurs, rubs, gallops. Mild edema RESPIRATORY:  Clear to auscultation without rales, wheezing or rhonchi  ABDOMEN: Soft, non-tender, non-distended MUSCULOSKELETAL:  No edema; No deformity  SKIN: Warm and dry NEUROLOGIC:  Alert and oriented x 3 PSYCHIATRIC:  Normal affect   ASSESSMENT:    1. Coronary artery disease involving native coronary artery of native heart without angina pectoris   2. Essential hypertension   3. Hyperlipidemia LDL goal <70   4. CAD S/P percutaneous coronary angioplasty      PLAN:    In order of problems listed above:  CAD: s/p PCI to RCA with DES in January 2023.    Continue aspirin. Continue Toprol XL and amlodipine. No significant angina.   Hypertension: Blood pressure well controlled on current therapy  Hyperlipidemia: Continue Zetia.  Now on Repatha. LDL 40 is at goal.   History of CVA: No recurrence.  5.   Edema.  Resolved.         Follow up with me in one year    Signed, Zyiere Rosemond [place], MD  04/02/2022 11:02 AM    [facility]

## 2022-10-09 ENCOUNTER — Encounter: Payer: Self-pay | Admitting: Cardiology

## 2022-10-09 ENCOUNTER — Ambulatory Visit: Payer: Medicare Other | Attending: Cardiology | Admitting: Cardiology

## 2022-10-09 VITALS — BP 136/80 | HR 90 | Ht 62.0 in | Wt 203.6 lb

## 2022-10-09 DIAGNOSIS — I10 Essential (primary) hypertension: Secondary | ICD-10-CM | POA: Diagnosis not present

## 2022-10-09 DIAGNOSIS — E785 Hyperlipidemia, unspecified: Secondary | ICD-10-CM

## 2022-10-09 DIAGNOSIS — I2510 Atherosclerotic heart disease of native coronary artery without angina pectoris: Secondary | ICD-10-CM | POA: Diagnosis not present

## 2022-10-09 NOTE — Patient Instructions (Signed)
Medication Instructions:  Continue same medications *If you need a refill on your cardiac medications before your next appointment, please call your pharmacy*   Lab Work: None ordered   Testing/Procedures: None ordered   Follow-Up: At [facility], you and your health needs are our priority.  As part of our continuing mission to provide you with exceptional heart care, we have created designated Provider Care Teams.  These Care Teams include your primary Cardiologist (physician) and Advanced Practice Providers (APPs -  Physician Assistants and Nurse Practitioners) who all work together to provide you with the care you need, when you need it.  We recommend signing up for the patient portal called "MyChart".  Sign up information is provided on this After Visit Summary.  MyChart is used to connect with patients for Virtual Visits (Telemedicine).  Patients are able to view lab/test results, encounter notes, upcoming appointments, etc.  Non-urgent messages can be sent to your provider as well.   To learn more about what you can do with MyChart, go to [email or web address].    Your next appointment:  1 year    Call in Feb to schedule May appointment     Provider:  Dr.Jordan   

## 2023-01-20 ENCOUNTER — Other Ambulatory Visit: Payer: Self-pay | Admitting: Cardiology

## 2023-01-20 ENCOUNTER — Other Ambulatory Visit: Payer: Self-pay

## 2023-01-20 MED ORDER — AMLODIPINE BESYLATE 5 MG PO TABS: 5.0000 mg | ORAL_TABLET | Freq: Every day | ORAL | 2 refills | Status: DC

## 2023-03-17 ENCOUNTER — Inpatient Hospital Stay: Payer: Medicare Other | Admitting: Oncology

## 2023-03-17 ENCOUNTER — Inpatient Hospital Stay: Payer: Medicare Other | Attending: Oncology

## 2023-03-17 VITALS — BP 139/82 | HR 90 | Temp 97.9°F | Resp 18 | Ht 62.0 in | Wt 204.0 lb

## 2023-03-17 DIAGNOSIS — C182 Malignant neoplasm of ascending colon: Secondary | ICD-10-CM

## 2023-03-17 DIAGNOSIS — Z8673 Personal history of transient ischemic attack (TIA), and cerebral infarction without residual deficits: Secondary | ICD-10-CM | POA: Insufficient documentation

## 2023-03-17 DIAGNOSIS — E538 Deficiency of other specified B group vitamins: Secondary | ICD-10-CM | POA: Insufficient documentation

## 2023-03-17 DIAGNOSIS — I10 Essential (primary) hypertension: Secondary | ICD-10-CM | POA: Diagnosis not present

## 2023-03-17 DIAGNOSIS — I252 Old myocardial infarction: Secondary | ICD-10-CM | POA: Insufficient documentation

## 2023-03-17 DIAGNOSIS — Z85038 Personal history of other malignant neoplasm of large intestine: Secondary | ICD-10-CM | POA: Diagnosis present

## 2023-03-17 DIAGNOSIS — Z9221 Personal history of antineoplastic chemotherapy: Secondary | ICD-10-CM | POA: Diagnosis not present

## 2023-03-17 LAB — CEA (ACCESS): CEA (CHCC): 1.84 ng/mL (ref 0.00–5.00)

## 2023-03-17 NOTE — Progress Notes (Signed)
[facility] OFFICE PROGRESS NOTE   Diagnosis: Colon cancer  INTERVAL HISTORY:   [name] returns as scheduled.  Good appetite.  She has occasional constipation.  No bleeding.  She reports urinary incontinence for the past 6 months.  Objective:  Vital signs in last 24 hours:  Blood pressure 139/82, pulse 90, temperature 97.9 F (36.6 C), temperature source Temporal, resp. rate 18, height 5\' 2"  (1.575 m), weight 204 lb (92.5 kg), SpO2 99%. Lymphatics: No cervical, supraclavicular, axillary, or inguinal nodes Resp: Lungs clear bilaterally Cardio: Regular rate and rhythm GI: No hepatosplenomegaly, no mass Vascular: No leg edema   Lab Results:  Lab Results  Component Value Date   WBC 8.1 04/02/2022   HGB 13.1 04/02/2022   HCT 40.9 04/02/2022   MCV 91 04/02/2022   PLT 322 04/02/2022   NEUTROABS 5.4 04/02/2022    CMP  Lab Results  Component Value Date   NA 141 05/25/2022   K 4.0 05/25/2022   CL 103 05/25/2022   CO2 26 05/25/2022   GLUCOSE 139 (H) 05/25/2022   BUN 16 05/25/2022   CREATININE 0.87 05/25/2022   CALCIUM 9.9 05/25/2022   PROT 7.2 04/02/2022   ALBUMIN 4.2 04/02/2022   AST 14 04/02/2022   ALT 13 04/02/2022   ALKPHOS 125 (H) 04/02/2022   BILITOT 0.3 04/02/2022   GFRNONAA >60 05/25/2022   GFRAA 58 (L) 12/04/2019    Lab Results  Component Value Date   CEA1 2.54 10/14/2020   CEA 1.84 03/17/2023    Medications: I have reviewed the patient's current medications.   Assessment/Plan: Colon cancer (T3, N1b) Colonoscopy 11/24/2018-large 5 cm malignant appearing fungating mass encompassing the ileocecal valve was identified.  Pathology showed invasive adenocarcinoma, moderately differentiated.   CT abdomen/pelvis 12/07/2018-eccentric wall thickening in the cecum, around the ileocecal valve, compatible with findings reported at the recent colonoscopy.  Borderline enlarged adjacent lymph nodes in the ileocolic mesentery.  No evidence for liver  metastases.   CEA 01/13/2019-12.7 CEA 02/23/19 postop/at start of adjuvant chemo - 1.83 01/17/2019 right hemicolectomy by Dr. Ezzard Standing.  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 Declined genetic testing on 02/20/19, patient indicated she may reconsider after completing adjuvant treatment. Seen by Maylon Cos CT chest 02/17/2019-tiny bilateral pulmonary nodules felt to be unlikely related to metastatic disease PAC placement per Dr. Ezzard Standing 02/22/2019; Port-A-Cath removed 06/29/2022 Cycle 1 adjuvant FOLFOX 02/23/19  Cycle 2 adjuvant FOLFOX 03/09/2019 Cycle 3 adjuvant FOLFOX 03/23/2019 (oxaliplatin dose reduced due to low normal neutrophil count) Cycle 4 adjuvant FOLFOX 04/06/2019 Cycle 5 adjuvant FOLFOX 05/01/2019 Cycle 6 adjuvant FOLFOX 05/15/2019 (oxaliplatin held due to neuropathy) CT 12/04/2019-stable right thyroid nodule, no evidence of metastatic disease, stable bilateral lung nodules CTs 10/15/2020- no evidence of metastatic disease chest, abdomen, pelvis.  Occasional tiny pulmonary nodules stable.  Interval enlargement of a nodule right lobe of the thyroid. CTs 10/14/2021-no metastatic disease in the chest, abdomen or pelvis.  Solitary tiny right lower lobe pulmonary nodule stable.   History of iron deficiency anemia Hypertension History of MI status post stent placement History of stroke 1996 Oxaliplatin neuropathy Anorexia/weight loss Report of balance difficulty B 12 deficiency-B12 weekly x4 beginning 12/09/2019.  On oral B12. Right thyroid nodule on CT 10/15/2020.  Referred for thyroid ultrasound.  Thyroid ultrasound  10/31/2020-3.3 cm right inferior TR 3 nodule meets criteria for biopsy, this correlates with the chest CT  finding.  Additional 1.5 cm right superior T3 nodule meets criteria for follow-up in 1 year.  FNA biopsy of right inferior thyroid lesion 11/12/2020-scant follicular epithelium present.  Nondiagnostic material.      Disposition: [name] remains in clinical remission from colon cancer.  She has not undergone a surveillance colonoscopy since she was diagnosed with colon cancer.  I encouraged her to schedule a colonoscopy.  She will return for an office visit and CEA in 6 months.  I recommended she see a urologist to evaluate the incontinence.  Thornton Papas, MD  03/17/2023  12:05 PM

## 2023-04-20 ENCOUNTER — Other Ambulatory Visit: Payer: Self-pay

## 2023-04-20 DIAGNOSIS — I214 Non-ST elevation (NSTEMI) myocardial infarction: Secondary | ICD-10-CM

## 2023-04-20 DIAGNOSIS — E785 Hyperlipidemia, unspecified: Secondary | ICD-10-CM

## 2023-04-20 DIAGNOSIS — I2510 Atherosclerotic heart disease of native coronary artery without angina pectoris: Secondary | ICD-10-CM

## 2023-04-20 MED ORDER — EZETIMIBE 10 MG PO TABS: 10.0000 mg | ORAL_TABLET | Freq: Every day | ORAL | 3 refills | Status: DC

## 2023-05-04 IMAGING — CT CT CHEST-ABD-PELV W/ CM
2 of 5 series · 15 of 46 positions shown, 17 images · IV contrast (APPLIED)
Comparison: 12/04/2019

CLINICAL DATA: Colon cancer restaging, status post right
hemicolectomy

EXAM:
CT CHEST, ABDOMEN, AND PELVIS WITH CONTRAST
TECHNIQUE: Multidetector CT imaging of the chest, abdomen and pelvis was
performed following the standard protocol during bolus
administration of intravenous contrast.
CONTRAST:  90mL OMNIPAQUE IOHEXOL 300 MG/ML SOLN, additional oral
enteric contrast

[Series 2: cap with · axial · 0.86mm/px · z∈[+1020,+1520]mm · 12 of 120 slices shown, 14 images]
[im 10/120  soft-tissue]
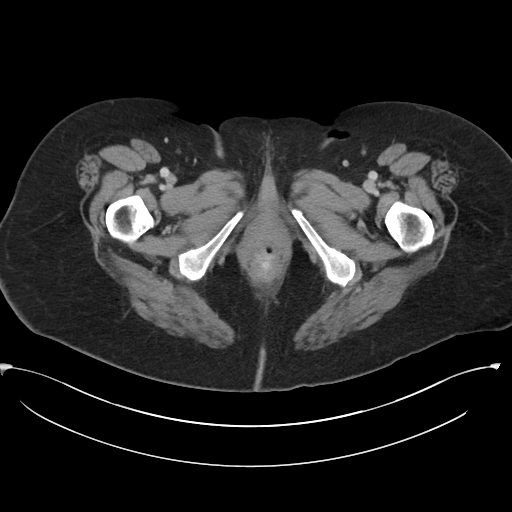
[im 10/120  bone]
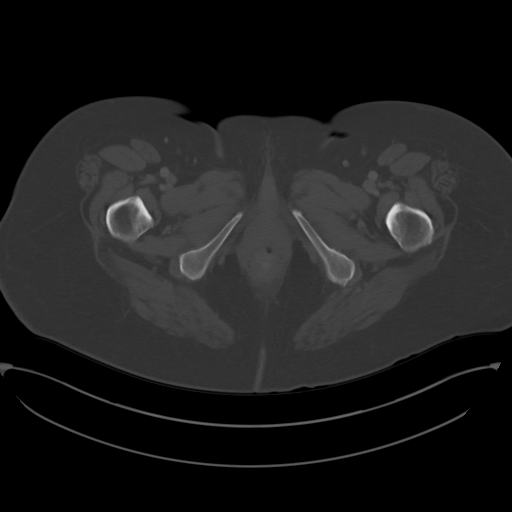
[im 19/120  soft-tissue]
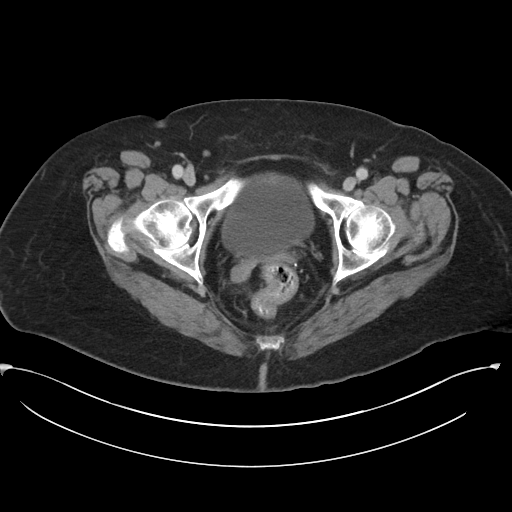
[im 28/120  soft-tissue]
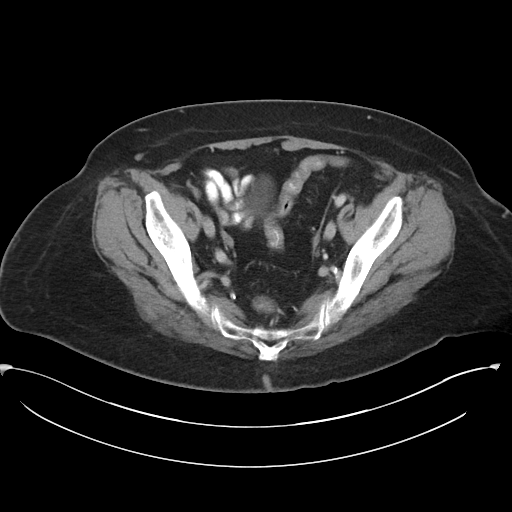
[im 37/120  soft-tissue]
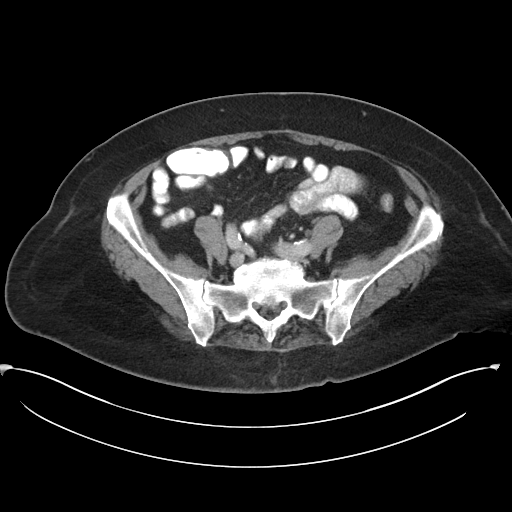
[im 46/120  soft-tissue]
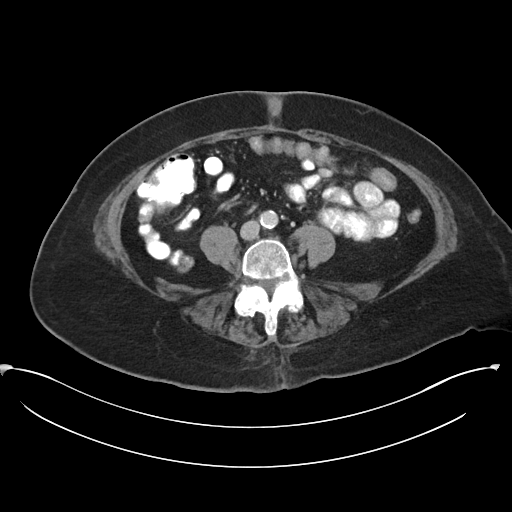
[im 55/120  soft-tissue]
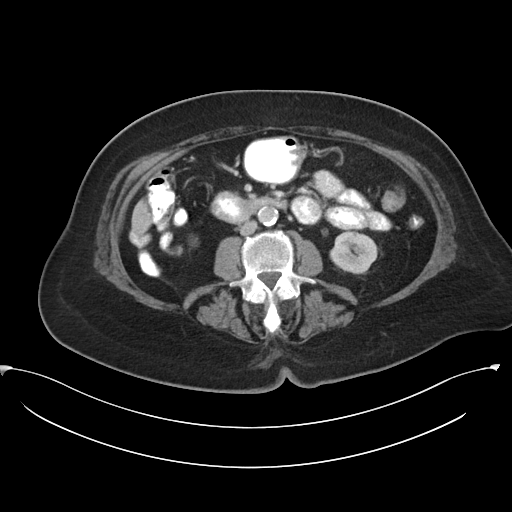
[im 65/120  soft-tissue]
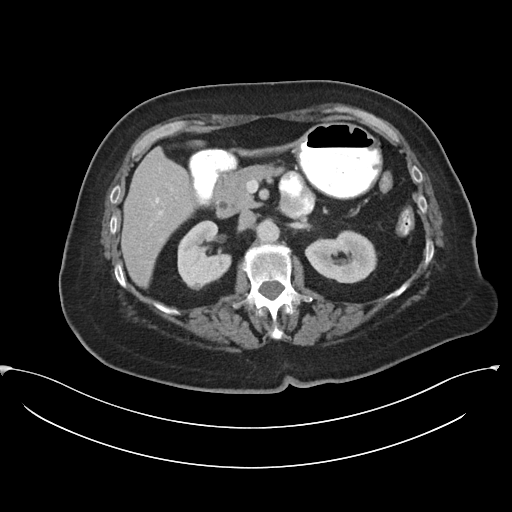
[im 74/120  soft-tissue]
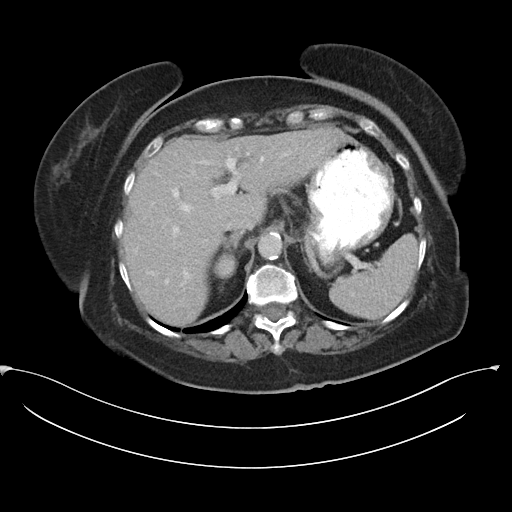
[im 83/120  soft-tissue]
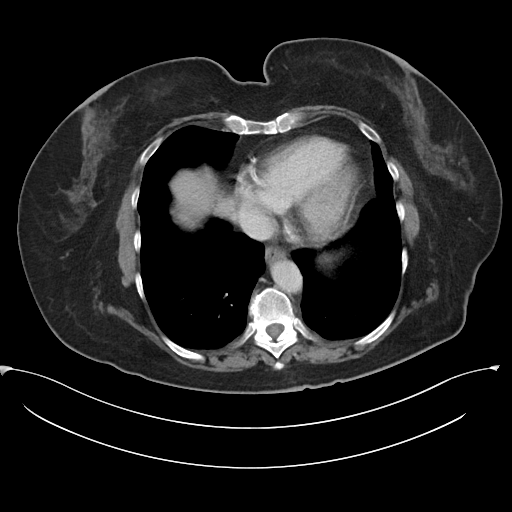
[im 83/120  bone]
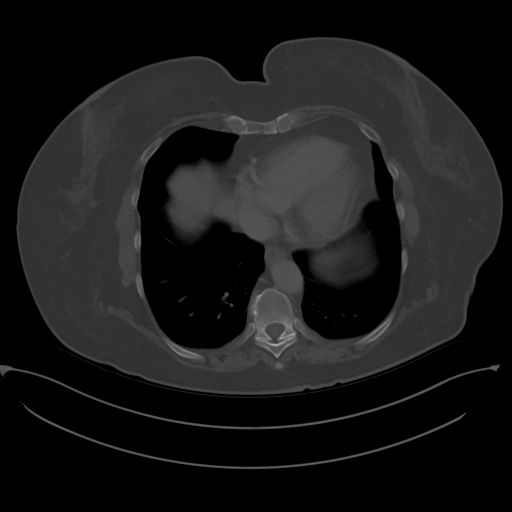
[im 92/120  soft-tissue]
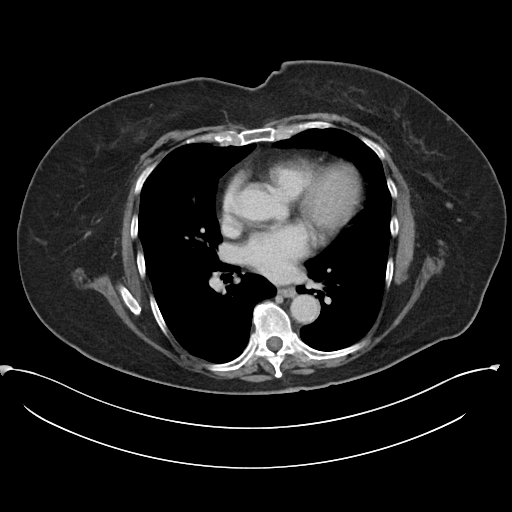
[im 101/120  soft-tissue]
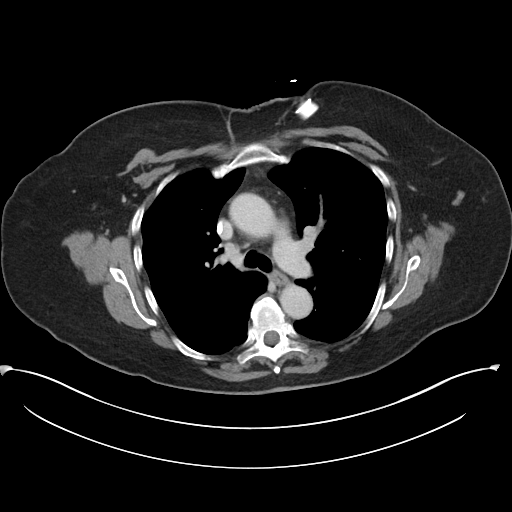
[im 110/120  soft-tissue]
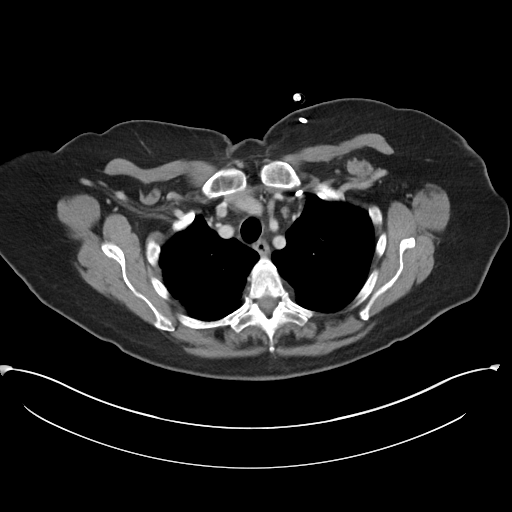

[Series 6: coronal · coronal · 0.97mm/px · 3 of 109 slices shown]
[im 37/109  soft-tissue]
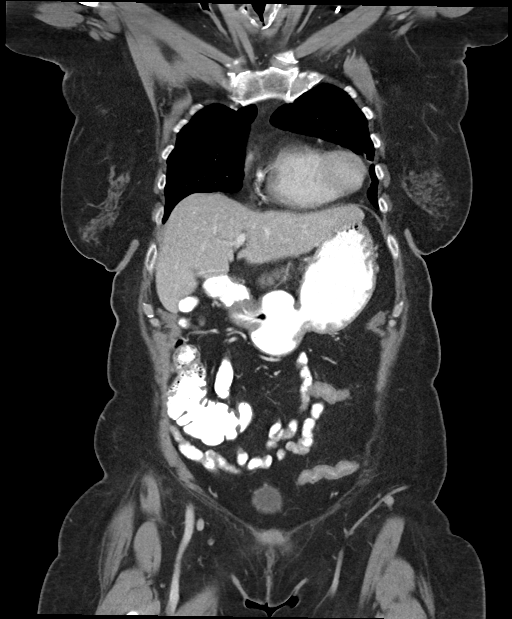
[im 49/109  soft-tissue]
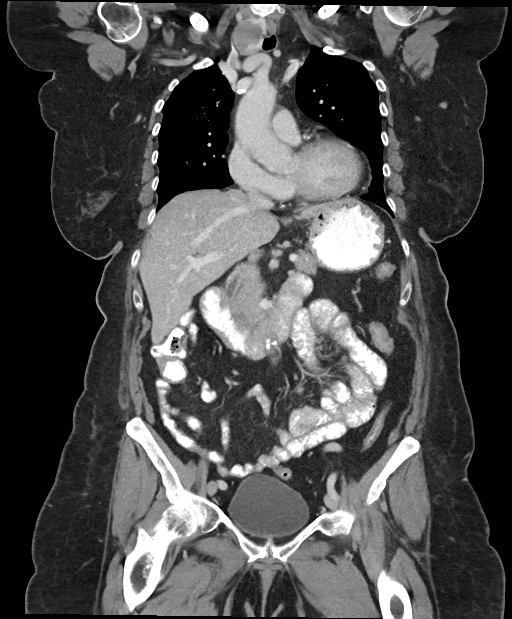
[im 61/109  soft-tissue]
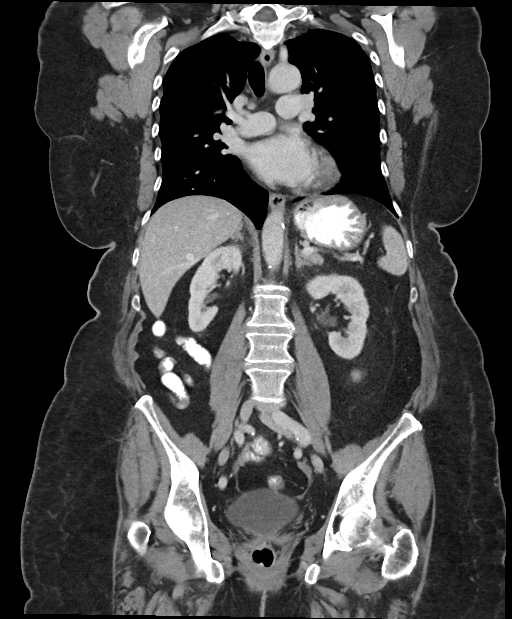

[15 of 46 positions shown; findings below may reference images not displayed]

FINDINGS: CT CHEST FINDINGS

Cardiovascular: Left chest port catheter. Aortic atherosclerosis.
Normal heart size. Three-vessel coronary artery calcifications. No
pericardial effusion.

Mediastinum/Nodes: No enlarged mediastinal, hilar, or axillary lymph
nodes. Interval enlargement of a nodule of the right lobe of the
thyroid, now measuring approximately 2.7 cm, previously 2.0 cm when
measured similarly (series 2, image 8). Trachea, and esophagus
demonstrate no significant findings.

Lungs/Pleura: Occasional tiny pulmonary nodules are stable and
almost certainly benign and incidental, for example in the right
lung base a 2 mm nodule (series 5, image 100). No pleural effusion
or pneumothorax.

Musculoskeletal: No chest wall mass or suspicious bone lesions
identified.

CT ABDOMEN PELVIS FINDINGS

Hepatobiliary: No focal liver abnormality is seen. Status post
cholecystectomy. No biliary dilatation.

Pancreas: Unremarkable. No pancreatic ductal dilatation or
surrounding inflammatory changes.

Spleen: Normal in size without significant abnormality.

Adrenals/Urinary Tract: Adrenal glands are unremarkable. Kidneys are
normal, without renal calculi, solid lesion, or hydronephrosis.
Bladder is unremarkable.

Stomach/Bowel: Stomach is within normal limits. Redemonstrated
postoperative findings of right hemicolectomy and reanastomosis. No
evidence of bowel wall thickening, distention, or inflammatory
changes.

Vascular/Lymphatic: Aortic atherosclerosis. No enlarged abdominal or
pelvic lymph nodes.

Reproductive: Status post hysterectomy.

Other: No abdominal wall hernia or abnormality. No abdominopelvic
ascites.

Musculoskeletal: No acute or significant osseous findings.
IMPRESSION: 1. Redemonstrated postoperative findings of right hemicolectomy and
reanastomosis.
2. No evidence of metastatic disease within the chest, abdomen, or
pelvis.
3. Occasional tiny pulmonary nodules are stable and almost certainly
benign and incidental. Continued attention on follow-up.
4. Interval enlargement of a nodule of the right lobe of the
thyroid, now measuring approximately 2.7 cm, previously 2.0 cm when
measured similarly. Recommend thyroid US (ref: [HOSPITAL].
[DATE]): 143-50).
5. Coronary artery disease.

Aortic Atherosclerosis ([id number].L).

## 2023-07-07 ENCOUNTER — Telehealth: Payer: Self-pay | Admitting: Cardiology

## 2023-07-07 NOTE — Telephone Encounter (Signed)
Pt c/o medication issue:  1. Name of Medication: Repatha  2. How are you currently taking this medication (dosage and times per day)?   3. Are you having a reaction (difficulty breathing--STAT)?   4. What is your medication issue? Patient says she can no longer afford Repatha- she says she needs something to replace it please

## 2023-07-07 NOTE — Telephone Encounter (Signed)
Repatha -- cost to much this year over $ 400   - patient states she took last dose this past weekend  She is requesting something cheaper.  Rn tired to explain to patient she would need to contact her insurance to see if this an cost that  is onetime only.  RN also tried to explain the possible [employer] assistance.   Patient said she would like like Dr Sw[place]iland to know she cannot afford that it will take up all her check -she wi=ould change something cheaper if not she will have to stop taking the medication.   RN informed patient will defr to Dr Sw[place]iland and  [facility] pharmacy team

## 2023-07-16 NOTE — Telephone Encounter (Signed)
 Spoke to patient she stated she would like to  try [employer] .Stated she cannot afford Repatha .I will send to Pharm D.

## 2023-07-17 NOTE — Telephone Encounter (Signed)
 Signed up for [facility].  ID      [id number] BIN    [id number] PCN   [id number] GRP   [id number]  Expires   06/15/2024

## 2023-08-08 ENCOUNTER — Emergency Department (HOSPITAL_BASED_OUTPATIENT_CLINIC_OR_DEPARTMENT_OTHER)

## 2023-08-08 ENCOUNTER — Emergency Department (HOSPITAL_BASED_OUTPATIENT_CLINIC_OR_DEPARTMENT_OTHER)
Admission: EM | Admit: 2023-08-08 | Discharge: 2023-08-08 | Disposition: A | Discharge status: Home / Self Care | Attending: Emergency Medicine | Admitting: Emergency Medicine

## 2023-08-08 DIAGNOSIS — Z7982 Long term (current) use of aspirin: Secondary | ICD-10-CM | POA: Diagnosis not present

## 2023-08-08 DIAGNOSIS — J181 Lobar pneumonia, unspecified organism: Secondary | ICD-10-CM | POA: Diagnosis not present

## 2023-08-08 DIAGNOSIS — J189 Pneumonia, unspecified organism: Secondary | ICD-10-CM

## 2023-08-08 DIAGNOSIS — R0602 Shortness of breath: Secondary | ICD-10-CM | POA: Diagnosis present

## 2023-08-08 DIAGNOSIS — E876 Hypokalemia: Secondary | ICD-10-CM

## 2023-08-08 LAB — CBC WITH DIFFERENTIAL/PLATELET
Abs Immature Granulocytes: 0.05 10*3/uL (ref 0.00–0.07)
Basophils Absolute: 0.1 10*3/uL (ref 0.0–0.1)
Basophils Relative: 1 %
Eosinophils Absolute: 0.1 10*3/uL (ref 0.0–0.5)
Eosinophils Relative: 1 %
HCT: 41.2 % (ref 36.0–46.0)
Hemoglobin: 13.8 g/dL (ref 12.0–15.0)
Immature Granulocytes: 1 %
Lymphocytes Relative: 30 %
Lymphs Abs: 2.9 10*3/uL (ref 0.7–4.0)
MCH: 29.9 pg (ref 26.0–34.0)
MCHC: 33.5 g/dL (ref 30.0–36.0)
MCV: 89.4 fL (ref 80.0–100.0)
Monocytes Absolute: 0.6 10*3/uL (ref 0.1–1.0)
Monocytes Relative: 6 %
Neutro Abs: 5.9 10*3/uL (ref 1.7–7.7)
Neutrophils Relative %: 61 %
Platelets: 389 10*3/uL (ref 150–400)
RBC: 4.61 MIL/uL (ref 3.87–5.11)
RDW: 13.5 % (ref 11.5–15.5)
WBC: 9.6 10*3/uL (ref 4.0–10.5)
nRBC: 0 % (ref 0.0–0.2)

## 2023-08-08 LAB — D-DIMER, QUANTITATIVE: D-Dimer, Quant: 1.04 ug{FEU}/mL — ABNORMAL HIGH (ref 0.00–0.50)

## 2023-08-08 LAB — COMPREHENSIVE METABOLIC PANEL
ALT: 23 U/L (ref 0–44)
AST: 18 U/L (ref 15–41)
Albumin: 3.9 g/dL (ref 3.5–5.0)
Alkaline Phosphatase: 78 U/L (ref 38–126)
Anion gap: 10 (ref 5–15)
BUN: 8 mg/dL (ref 8–23)
CO2: 28 mmol/L (ref 22–32)
Calcium: 9.7 mg/dL (ref 8.9–10.3)
Chloride: 102 mmol/L (ref 98–111)
Creatinine, Ser: 1.07 mg/dL — ABNORMAL HIGH (ref 0.44–1.00)
GFR, Estimated: 53 mL/min — ABNORMAL LOW (ref >60)
Glucose, Bld: 120 mg/dL — ABNORMAL HIGH (ref 70–99)
Potassium: 2.9 mmol/L — ABNORMAL LOW (ref 3.5–5.1)
Sodium: 140 mmol/L (ref 135–145)
Total Bilirubin: 0.6 mg/dL (ref 0.0–1.2)
Total Protein: 7.2 g/dL (ref 6.5–8.1)

## 2023-08-08 LAB — RESP PANEL BY RT-PCR (RSV, FLU A&B, COVID)  RVPGX2
Influenza A by PCR: NEGATIVE
Influenza B by PCR: NEGATIVE
Resp Syncytial Virus by PCR: NEGATIVE
SARS Coronavirus 2 by RT PCR: NEGATIVE

## 2023-08-08 LAB — TROPONIN I (HIGH SENSITIVITY)
Troponin I (High Sensitivity): 8 ng/L (ref <18)
Troponin I (High Sensitivity): 10 ng/L (ref <18)

## 2023-08-08 LAB — BRAIN NATRIURETIC PEPTIDE: B Natriuretic Peptide: 85.1 pg/mL (ref 0.0–100.0)

## 2023-08-08 MED ORDER — DOXYCYCLINE HYCLATE 100 MG PO TABS
100.0000 mg | ORAL_TABLET | Freq: Once | ORAL | Status: AC
  Administered 2023-08-08: 100 mg via ORAL
  Filled 2023-08-08: qty 1

## 2023-08-08 MED ORDER — DOXYCYCLINE HYCLATE 100 MG PO CAPS: 100.0000 mg | ORAL_CAPSULE | Freq: Two times a day (BID) | ORAL | 0 refills | Status: AC

## 2023-08-08 MED ORDER — POTASSIUM CHLORIDE 10 MEQ/100ML IV SOLN
10.0000 meq | INTRAVENOUS | Status: AC
  Administered 2023-08-08 (×2): 10 meq via INTRAVENOUS
  Filled 2023-08-08: qty 100

## 2023-08-08 MED ORDER — IOHEXOL 350 MG/ML SOLN
75.0000 mL | Freq: Once | INTRAVENOUS | Status: AC | PRN
  Administered 2023-08-08: 75 mL via INTRAVENOUS

## 2023-08-08 MED ORDER — POTASSIUM CHLORIDE CRYS ER 20 MEQ PO TBCR
40.0000 meq | EXTENDED_RELEASE_TABLET | Freq: Once | ORAL | Status: AC
  Administered 2023-08-08: 40 meq via ORAL
  Filled 2023-08-08: qty 2

## 2023-08-08 NOTE — Discharge Instructions (Addendum)
 You were seen in the emergency department today for concerns of shortness of breath.  Your lab workup is thankfully reassuring with exception of a CT scan that does show some concerns for what is considered signs of possible pneumonia.  Given this finding and her age, I decided to start her on antibiotic therapy to reduce the risk of complications at home.  I would recommend following up with your primary care provider for repeat evaluation and return to the emergency department for any new or worsening symptoms.

## 2023-08-08 NOTE — ED Notes (Signed)
 Dc instructions reviewed with patient. Patient voiced understanding. Dc with belongings.

## 2023-08-08 NOTE — ED Provider Notes (Signed)
 [place] EMERGENCY DEPARTMENT AT [facility] Provider Note   CSN: [id number] Arrival date & time: 08/08/23  1343     History Chief Complaint  Patient presents with   Shortness of Breath    [name] is a 78 y.o. female.  Patient with past history significant for prior NSTEMI, unstable angina, obesity, history of cancer presents ED with concerns of shortness of breath.  She reports that she has been feeling generally unwell within the last week with bodyaches, cough, headache but states that today she began to experience notable shortness of breath.  Denies any significant chest pain.  No nausea, vomiting, diaphoresis.  Does endorse a history of prior MI but denies history of PE.  Not on blood thinners at this time.  No recent hemoptysis, leg swelling, recent surgery or prolonged immobilization.   Shortness of Breath      Home Medications Prior to Admission medications   Medication Sig Start Date End Date Taking? Authorizing Provider  doxycycline (VIBRAMYCIN) 100 MG capsule Take 1 capsule (100 mg total) by mouth 2 (two) times daily for 5 days. 08/08/23 08/13/23 Yes Smitty Knudsen, PA-C  acetaminophen (TYLENOL) 650 MG CR tablet Take 650 mg by mouth every 8 (eight) hours as needed for pain.    [provider]  amLODipine (NORVASC) 5 MG tablet Take 1 tablet (5 mg total) by mouth daily. 01/20/23   [place], Peter M, MD  Ascorbic Acid (VITAMIN C) 1000 MG tablet Take 1,000 mg by mouth daily.    [provider]  ASPIRIN 81 PO Take 81 mg by mouth daily.    [provider]  ezetimibe (ZETIA) 10 MG tablet Take 1 tablet (10 mg total) by mouth daily. 04/20/23   [place], Peter M, MD  loperamide (IMODIUM) 2 MG capsule Take 2-4 mg by mouth 4 (four) times daily as needed for diarrhea or loose stools. Patient not taking: Reported on 09/14/2022    [provider]  metoprolol succinate (TOPROL-XL) 25 MG 24 hr tablet TAKE 1/2 TABLET BY MOUTH EVERY DAY  01/20/23   [place], Peter M, MD  nitroGLYCERIN (NITROSTAT) 0.4 MG SL tablet Place 1 tablet (0.4 mg total) under the tongue every 5 (five) minutes x 3 doses as needed for chest pain. Patient not taking: Reported on 03/17/2023 06/12/21   Azalee Course, PA  nystatin cream (MYCOSTATIN) Apply 1 application topically 2 (two) times daily as needed (rash).    [provider]  omeprazole (PRILOSEC) 40 MG capsule Take 40 mg by mouth daily. 12/06/19   [provider]  REPATHA SURECLICK 140 MG/ML SOAJ ADMINISTER 1 ML UNDER THE SKIN EVERY 14 DAYS 07/07/22   Azalee Course, PA  valsartan (DIOVAN) 160 MG tablet Take 160 mg by mouth daily. 12/27/20   [provider]  vitamin B-12 (CYANOCOBALAMIN) 1000 MCG tablet Take 1,000 mcg by mouth daily. 12/08/19   Ladene Artist, MD  VITAMIN D PO Take 1 tablet by mouth daily.    [provider]  VITAMIN E PO Take 1 tablet by mouth daily.    [provider]      Allergies    Guaifenesin er, Lotensin [benazepril hcl], and Propoxyphene    Review of Systems   Review of Systems  Respiratory:  Positive for shortness of breath.   All other systems reviewed and are negative.   Physical Exam Updated Vital Signs BP (!) 171/92   Pulse 78   Temp 98.9 F (37.2 C)   Resp  14   Ht 5\' 2"  (1.575 m)   Wt 92.5 kg   SpO2 100%   BMI 37.30 kg/m  Physical Exam Vitals and nursing note reviewed.  Constitutional:      General: She is not in acute distress.    Appearance: She is well-developed.  HENT:     Head: Normocephalic and atraumatic.  Eyes:     Conjunctiva/sclera: Conjunctivae normal.  Cardiovascular:     Rate and Rhythm: Normal rate and regular rhythm.     Heart sounds: No murmur heard. Pulmonary:     Effort: Pulmonary effort is normal. No respiratory distress.     Breath sounds: Normal breath sounds.  Abdominal:     Palpations: Abdomen is soft.     Tenderness: There is no abdominal tenderness.  Musculoskeletal:        General: No  swelling.     Cervical back: Neck supple.     Right lower leg: No edema.     Left lower leg: No edema.  Skin:    General: Skin is warm and dry.     Capillary Refill: Capillary refill takes less than 2 seconds.  Neurological:     Mental Status: She is alert.  Psychiatric:        Mood and Affect: Mood normal.     ED Results / Procedures / Treatments   Labs (all labs ordered are listed, but only abnormal results are displayed) Labs Reviewed  COMPREHENSIVE METABOLIC PANEL - Abnormal; Notable for the following components:      Result Value   Potassium 2.9 (*)    Glucose, Bld 120 (*)    Creatinine, Ser 1.07 (*)    GFR, Estimated 53 (*)    All other components within normal limits  D-DIMER, QUANTITATIVE - Abnormal; Notable for the following components:   D-Dimer, Quant 1.04 (*)    All other components within normal limits  RESP PANEL BY RT-PCR (RSV, FLU A&B, COVID)  [id number]  CBC WITH DIFFERENTIAL/PLATELET  BRAIN NATRIURETIC PEPTIDE  TROPONIN I (HIGH SENSITIVITY)  TROPONIN I (HIGH SENSITIVITY)    EKG EKG Interpretation Date/Time:  Sunday August 08 2023 13:53:05 EST Ventricular Rate:  83 PR Interval:  144 QRS Duration:  82 QT Interval:  378 QTC Calculation: 444 R Axis:   -47  Text Interpretation: Sinus rhythm with Premature supraventricular complexes and Premature ventricular complexes or Fusion complexes Left axis deviation Minimal voltage criteria for LVH, may be normal variant ( R in aVL ) Inferior infarct (cited on or before 10-Jun-2021) Anterolateral infarct (cited on or before 09-Jun-2021) Abnormal ECG When compared with ECG of 11-Jun-2021 08:11, Significant changes have occurred Confirmed by Virgina Norfolk [phone]) on 08/08/2023 1:55:34 PM  Radiology CT Angio Chest PE W and/or Wo Contrast Result Date: 08/08/2023 CLINICAL DATA:  Shortness of breath, concern for pulmonary embolism. EXAM: CT ANGIOGRAPHY CHEST WITH CONTRAST TECHNIQUE: Multidetector CT imaging of the chest was  performed using the standard protocol during bolus administration of intravenous contrast. Multiplanar CT image reconstructions and MIPs were obtained to evaluate the vascular anatomy. RADIATION DOSE REDUCTION: This exam was performed according to the departmental dose-optimization program which includes automated exposure control, adjustment of the mA and/or kV according to patient size and/or use of iterative reconstruction technique. CONTRAST:  75mL OMNIPAQUE IOHEXOL 350 MG/ML SOLN COMPARISON:  CT chest dated 10/14/2021. FINDINGS: Cardiovascular: Satisfactory opacification of the pulmonary arteries to the segmental level. No evidence of pulmonary embolism. Vascular calcifications are seen in the coronary arteries and aortic  arch. The heart is enlarged. No pericardial effusion. Mediastinum/Nodes: No enlarged mediastinal, hilar, or axillary lymph nodes. Thyroid gland, trachea, and esophagus demonstrate no significant findings. Lungs/Pleura: Minimal bilateral ground-glass opacities and septal line thickening. Minimal bibasilar atelectasis. Upper Abdomen: No acute abnormality. Musculoskeletal: Degenerative changes are seen in the spine. Review of the MIP images confirms the above findings. IMPRESSION: 1. No evidence of pulmonary embolism. 2. Minimal bilateral ground-glass opacities and septal line thickening may reflect mild pulmonary edema. Aortic Atherosclerosis (ICD10-I70.0). Electronically Signed   By: Romona Curls M.D.   On: 08/08/2023 16:24   DG Chest Portable 1 View Result Date: 08/08/2023 CLINICAL DATA:  Cough, shortness of breath. EXAM: PORTABLE CHEST 1 VIEW COMPARISON:  Radiographs 06/09/2021 and 02/22/2019. Chest CT 10/14/2021. FINDINGS: 1400 hours. Port-A-Cath has been removed in the interval. The heart size and mediastinal contours are stable. The lungs appear clear. No pleural effusion or pneumothorax. The bones appear unchanged. Telemetry leads overlie the chest. IMPRESSION: No evidence of acute  cardiopulmonary process. Interval removal of Port-A-Cath. Electronically Signed   By: Carey Bullocks M.D.   On: 08/08/2023 14:29    Procedures Procedures    Medications Ordered in ED Medications  potassium chloride 10 mEq in 100 mL IVPB (10 mEq Intravenous New Bag/Given 08/08/23 1738)  doxycycline (VIBRA-TABS) tablet 100 mg (has no administration in time range)  iohexol (OMNIPAQUE) 350 MG/ML injection 75 mL (75 mLs Intravenous Contrast Given 08/08/23 1552)  potassium chloride SA (KLOR-CON M) CR tablet 40 mEq (40 mEq Oral Given 08/08/23 1748)    ED Course/ Medical Decision Making/ A&P                                 Medical Decision Making Amount and/or Complexity of Data Reviewed Labs: ordered. Radiology: ordered.   This patient presents to the ED for concern of shortness of breath.  Differential diagnosis includes COVID-19, pneumonia, bronchitis, PE   Lab Tests:  I Ordered, and personally interpreted labs.  The pertinent results include: CBC unremarkable, troponin at 8 with repeat at 10  which is improved compared to most recent troponins to compare to when patient experienced NSTEMI, respiratory panel negative, BNP unremarkable, D-dimer elevated at 1.04   Imaging Studies ordered:  I ordered imaging studies including chest x-ray, CT angio chest I independently visualized and interpreted imaging which showed no evidence of any acute cardiopulmonary process, No evidence of pulmonary embolism. 2. Minimal bilateral ground-glass opacities and septal line thickening may reflect mild pulmonary edema. I agree with the radiologist interpretation   Medicines ordered and prescription drug management:  I ordered medication including potassium chloride, doxycycline for hypokalemia, pneumonia Reevaluation of the patient after these medicines showed that the patient stayed the same I have reviewed the patients home medicines and have made adjustments as needed   Problem List / ED  Course:  Patient with prior history significant for NSTEMI, unstable angina, obesity, history of cancer presents ED with concerns of shortness of breath.  Reports that she has been feeling unwell this last week with bodyaches, cough, headache and today began to experience notable shortness of breath.  No chest pain, dizziness, leg swelling.  Patient has some notable cardiac history of prior NSTEMI.  She also has been treated for cancer recently.  She is not on blood thinners.  Denies any leg swelling, wheezing, or urinary changes. Exam is reassuring.  No appreciable heart murmur and lung sounds are clear  to auscultation bilaterally.  No notable leg swelling either.  Work initiated from triage but added on D-dimer for assessment for possible clot as patient is at elevated risk for PE. Patient's D-dimer is elevated.  Labs otherwise unremarkable with exception of mild hypokalemia 2.9.  Given elevated D-dimer, will obtain CT angio chest.  Chest x-ray unremarkable.  Patient started on potassium chloride to help replenish potassium levels. CT angio chest negative for any acute cardiopulmonary findings.  No evidence of PE.  Some concern for possible groundglass opacifications bilaterally which could reflect pulmonary edema.  In the setting of patient having a normal BNP, concern for possible pneumonia given prolonged course of symptoms around 7 to 10 days.  I discussed findings with patient and mutually decided to initiate antibiotic therapy given prolonged course of symptoms and CT imaging findings.  Patient is in agreement with this plan.  Will start patient on doxycycline.  Advise return precautions such as new or worsening symptoms.  Patient otherwise stable and discharged home with plans for outpatient follow-up.   Final Clinical Impression(s) / ED Diagnoses Final diagnoses:  Pneumonia of both lungs due to infectious organism, unspecified part of lung    Rx / DC Orders ED Discharge Orders           Ordered    doxycycline (VIBRAMYCIN) 100 MG capsule  2 times daily        08/08/23 1727              Smitty Knudsen, PA-C 08/08/23 1804    Virgina Norfolk, DO 08/11/23 1458

## 2023-08-08 NOTE — ED Triage Notes (Signed)
 States she has had body aches, cough, headache x 1 week. States today she became [facility]. Denies CP.

## 2023-08-18 ENCOUNTER — Other Ambulatory Visit: Payer: Self-pay | Admitting: Physician Assistant

## 2023-08-18 ENCOUNTER — Telehealth: Payer: Self-pay | Admitting: Cardiology

## 2023-08-18 DIAGNOSIS — E785 Hyperlipidemia, unspecified: Secondary | ICD-10-CM

## 2023-08-18 DIAGNOSIS — I2510 Atherosclerotic heart disease of native coronary artery without angina pectoris: Secondary | ICD-10-CM

## 2023-08-18 DIAGNOSIS — I214 Non-ST elevation (NSTEMI) myocardial infarction: Secondary | ICD-10-CM

## 2023-08-18 NOTE — Telephone Encounter (Signed)
 Pt c/o medication issue:  1. Name of Medication:  REPATHA SURECLICK 140 MG/ML SOAJ  2. How are you currently taking this medication (dosage and times per day)?   3. Are you having a reaction (difficulty breathing--STAT)?   4. What is your medication issue?  Patient states she took her [employer] approval letter through 06/15/24 to her local pharmacy and they denied it. She mentions that the letter was dated 2/12, but she had the flu and she's just now beginning to go out again. She thinks she may have waited too late. Does application need to be revised or resubmitted? Please advise.

## 2023-08-18 NOTE — Telephone Encounter (Signed)
 Called and spoke to [facility] pharmacy  Pharmacy states needs updated prescription for Repatha Rx and will need to order Rx

## 2023-08-20 ENCOUNTER — Telehealth: Payer: Self-pay | Admitting: Oncology

## 2023-08-20 NOTE — Telephone Encounter (Signed)
 Left patient vm regarding upcoming appointment changes

## 2023-09-07 ENCOUNTER — Telehealth: Payer: Self-pay | Admitting: Cardiology

## 2023-09-07 NOTE — Telephone Encounter (Signed)
 Pt c/o medication issue:  1. Name of Medication: Repatha  2. How are you currently taking this medication (dosage and times per day)?   3. Are you having a reaction (difficulty breathing--STAT)?   4. What is your medication issue? Patient wants [name] to call her. She says she is not taking Repatha at this tim. She says her insurance will not pay for it.

## 2023-09-07 NOTE — Telephone Encounter (Signed)
 Spoke to patient she stated she cannot afford Repatha.Stated she applied for [employer] and was denied.Advised I will speak to our pharmacist and call you back

## 2023-09-08 ENCOUNTER — Other Ambulatory Visit (HOSPITAL_COMMUNITY): Payer: Self-pay

## 2023-09-08 ENCOUNTER — Encounter: Payer: Self-pay | Admitting: Oncology

## 2023-09-08 ENCOUNTER — Telehealth: Payer: Self-pay

## 2023-09-08 DIAGNOSIS — I2510 Atherosclerotic heart disease of native coronary artery without angina pectoris: Secondary | ICD-10-CM

## 2023-09-08 DIAGNOSIS — I214 Non-ST elevation (NSTEMI) myocardial infarction: Secondary | ICD-10-CM

## 2023-09-08 DIAGNOSIS — E785 Hyperlipidemia, unspecified: Secondary | ICD-10-CM

## 2023-09-08 NOTE — Telephone Encounter (Signed)
 Pharmacy Patient Advocate Encounter   Received notification from Physician's Office that prior authorization for REPATHA is required/requested.   Insurance verification completed.   The patient is insured through [facility] MEDICARE .   Per test claim: PA required; PA submitted to above mentioned insurance via CoverMyMeds Key/confirmation #/EOC [id number] Status is pending

## 2023-09-09 ENCOUNTER — Other Ambulatory Visit (HOSPITAL_COMMUNITY): Payer: Self-pay

## 2023-09-09 ENCOUNTER — Other Ambulatory Visit: Payer: Self-pay

## 2023-09-09 ENCOUNTER — Other Ambulatory Visit (HOSPITAL_BASED_OUTPATIENT_CLINIC_OR_DEPARTMENT_OTHER): Payer: Self-pay

## 2023-09-09 MED ORDER — REPATHA SURECLICK 140 MG/ML ~~LOC~~ SOAJ
140.0000 mg | SUBCUTANEOUS | 3 refills | Status: AC
  Filled 2023-09-09 – 2023-11-19 (×3): qty 6, 84d supply, fill #0
  Filled 2024-02-18: qty 6, 84d supply, fill #1
  Filled 2024-05-12: qty 6, 84d supply, fill #2

## 2023-09-09 NOTE — Telephone Encounter (Signed)
 HW Grant on file at [employer]

## 2023-09-09 NOTE — Telephone Encounter (Signed)
 Pharmacy Patient Advocate Encounter  Received notification from [facility] MEDICARE that Prior Authorization for REPATHA has been APPROVED from 09/08/23 to 09/07/24

## 2023-09-09 NOTE — Telephone Encounter (Signed)
 Rx sent to [facility] since grant is on file. Called patient to let her know. No answer, lmom

## 2023-09-10 ENCOUNTER — Other Ambulatory Visit (HOSPITAL_BASED_OUTPATIENT_CLINIC_OR_DEPARTMENT_OTHER): Payer: Self-pay

## 2023-09-14 NOTE — Telephone Encounter (Signed)
 Called patient back. Patient stated to let Thayer Ohm the pharmacist know she picked up the Repatha and took in on Sunday morning. Patient thanked [place] for all our help.

## 2023-09-14 NOTE — Telephone Encounter (Signed)
 Patient is returning phone call.

## 2023-09-14 NOTE — Telephone Encounter (Signed)
 Spoke to patient [employer] approved Repatha.Stated PharmD already called to let her know.She took her first dose this morning.

## 2023-09-15 ENCOUNTER — Ambulatory Visit: Payer: Medicare Other | Admitting: Oncology

## 2023-09-15 ENCOUNTER — Inpatient Hospital Stay: Payer: Medicare Other

## 2023-09-15 ENCOUNTER — Other Ambulatory Visit: Payer: Self-pay

## 2023-10-06 NOTE — Progress Notes (Signed)
 d Cardiology Office Note:    Date:  10/11/2023   ID:  Heavin, Falkenhagen 1945-12-17, MRN [id number]  PCP:  Medicine, [facility]   [facility] Providers Cardiologist:  Carline Dura [place], MD     Referring MD: Emaline Handsome, MD   Chief Complaint  Patient presents with   Coronary Artery Disease    History of Present Illness:    [name] is a 78 y.o. female with a hx of CAD, hypertension, hyperlipidemia, obesity, colon cancer s/p right hemicolectomy, and CVA.  Patient was  admitted to the hospital on 06/09/2021 with chest discomfort.  She initially went to the [facility] ED, blood work showed borderline elevated troponin.  She was taken to the Cath Lab on 06/11/2021.  Cardiac catheterization revealed a 95% mid RCA lesion that was heavily calcified and diffusely diseased, this was treated with a Synergy 3.5 x 24 mm DES postdilated to 3.75 mm in diameter.  There were at least 2 spots within the stented segment that was not fully expanded and had a 35% stenosis, therefore the patient was consider having higher than normal risk for restenosis.  EF was 55%.  Postprocedure, she was placed on aspirin  to Brilinta  with recommendation to continue dual antiplatelet therapy for at least 1 year.  Echocardiogram showed EF 60 to 65%, mild LVH, grade 1 DD, trivial MR, dilated aortic root at 37 mm.  She was subsequently seen in lipid clinic and started on Repatha .LDL dropped to 40.   She was seen in the ED in March with SOB. D dimer and BNP negative. Delta troponin ok. Ecg stable. CT with no PE. ? Ground glass findings. Rx for PNA.  On follow up today she is doing well. Noted one episode of left sided chest pain lying on her left side. She sat up and it went away. Only lasted 30 seconds.  She notes problems with her balance and walks with a walker. No edema or SOB. Notes she was off Repatha  in Dec-Jan due to cost but now getting with a health grant.    Past Medical  History:  Diagnosis Date   Cancer of right colon (HCC)    Constipation    CVA (cerebral infarction) 1996   Dyspnea    going up stairs and hills   Family history of colon cancer    GERD (gastroesophageal reflux disease)    History of cardiomegaly    Hyperlipidemia    Hypertension    Iron deficiency anemia    NSTEMI (non-ST elevated myocardial infarction) (HCC) 2017   Personal history of colonic polyps    PONV (postoperative nausea and vomiting)    Right shoulder pain    more painful at night , cold makes it worse   Severe obesity (HCC)     Past Surgical History:  Procedure Laterality Date   ABDOMINAL HYSTERECTOMY     BACK SURGERY     x2   CARDIAC CATHETERIZATION N/A 02/28/2016   Procedure: Left Heart Cath and Coronary Angiography;  Surgeon: Antoine Fiallos M [place], MD;  Location: [facility] INVASIVE CV LAB;  Service: Cardiovascular;  Laterality: N/A;   CARDIAC CATHETERIZATION N/A 02/28/2016   Procedure: Coronary Stent Intervention;  Surgeon: Jeremih Dearmas M [place], MD;  Location: [name] INVASIVE CV LAB;  Service: Cardiovascular;  Laterality: N/A;   CHOLECYSTECTOMY     COLONOSCOPY  11/24/2018   CORONARY ANGIOPLASTY     CORONARY STENT INTERVENTION N/A 06/10/2021   Procedure: CORONARY STENT INTERVENTION;  Surgeon: Kay Parson  W, MD;  Location: [facility] INVASIVE CV LAB;  Service: Cardiovascular;  Laterality: N/A;   CYST EXCISION Left    Cheek   IR REMOVAL TUN ACCESS W/ PORT W/O FL MOD SED  06/29/2022   LAPAROSCOPIC RIGHT HEMI COLECTOMY Right 01/17/2019   Procedure: LAPAROSCOPIC RIGHT HEMI COLECTOMY;  Surgeon: Juanita Norlander, MD;  Location: [facility] ORS;  Service: General;  Laterality: Right;   LEFT HEART CATH AND CORONARY ANGIOGRAPHY N/A 06/10/2021   Procedure: LEFT HEART CATH AND CORONARY ANGIOGRAPHY;  Surgeon: Arty Binning, MD;  Location: [facility] INVASIVE CV LAB;  Service: Cardiovascular;  Laterality: N/A;   NASAL FRACTURE SURGERY     PORTACATH PLACEMENT N/A 02/22/2019   Procedure: POWER PORT PLACEMENT;  Surgeon: Juanita Norlander,  MD;  Location: [facility] OR;  Service: General;  Laterality: N/A;   UPPER GI ENDOSCOPY  11/24/2018    Current Medications: Current Meds  Medication Sig   acetaminophen  (TYLENOL ) 650 MG CR tablet Take 650 mg by mouth every 8 (eight) hours as needed for pain.   amLODipine  (NORVASC ) 5 MG tablet Take 1 tablet (5 mg total) by mouth daily.   Ascorbic Acid (VITAMIN C) 1000 MG tablet Take 1,000 mg by mouth daily.   ASPIRIN  81 PO Take 81 mg by mouth daily.   Evolocumab  (REPATHA  SURECLICK) 140 MG/ML SOAJ Inject 140 mg into the skin every 14 (fourteen) days.   ezetimibe  (ZETIA ) 10 MG tablet Take 1 tablet (10 mg total) by mouth daily.   loperamide (IMODIUM) 2 MG capsule Take 2-4 mg by mouth 4 (four) times daily as needed for diarrhea or loose stools.   metoprolol  succinate (TOPROL -XL) 25 MG 24 hr tablet TAKE 1/2 TABLET BY MOUTH EVERY DAY   nitroGLYCERIN  (NITROSTAT ) 0.4 MG SL tablet Place 1 tablet (0.4 mg total) under the tongue every 5 (five) minutes x 3 doses as needed for chest pain.   nystatin cream (MYCOSTATIN) Apply 1 application  topically 2 (two) times daily as needed (rash).   omeprazole (PRILOSEC) 40 MG capsule Take 40 mg by mouth daily.   valsartan (DIOVAN) 160 MG tablet Take 160 mg by mouth daily.   vitamin B-12 (CYANOCOBALAMIN ) 1000 MCG tablet Take 1,000 mcg by mouth daily.   VITAMIN D PO Take 1 tablet by mouth daily.   VITAMIN E PO Take 1 tablet by mouth daily.     Allergies:   Guaifenesin  er, Lotensin [benazepril hcl], and Propoxyphene   Social History   Socioeconomic History   Marital status: Married    Spouse name: Not on file   Number of children: Not on file   Years of education: Not on file   Highest education level: Not on file  Occupational History   Not on file  Tobacco Use   Smoking status: Never   Smokeless tobacco: Never  Vaping Use   Vaping status: Never Used  Substance and Sexual Activity   Alcohol use: No   Drug use: No   Sexual activity: Not on file  Other  Topics Concern   Not on file  Social History Narrative   Not on file   Social Drivers of Health   Financial Resource Strain: Low Risk  (08/18/2023)   Received [facility]   Overall Financial Resource Strain (CARDIA)    Difficulty of Paying Living Expenses: Not hard at all  Food Insecurity: No Food Insecurity (08/18/2023)   Received from [facility]   Hunger Vital Sign    Worried About Running Out of Food in the  Last Year: Never true    Ran Out of Food in the Last Year: Never true  Transportation Needs: No Transportation Needs (08/18/2023)   Received from [facility]   PRAPARE - Transportation    Lack of Transportation (Medical): No    Lack of Transportation (Non-Medical): No  Physical Activity: Unknown (01/25/2023)   Received from [facility]   Exercise Vital Sign    Days of Exercise per Week: 0 days    Minutes of Exercise per Session: Not on file  Stress: No Stress Concern Present (01/25/2023)   Received from [facility] - Occupational Stress Questionnaire    Feeling of Stress : Not at all  Social Connections: Socially Integrated (01/25/2023)   Received from [facility]   Social Network    How would you rate your social network (family, work, friends)?: Good participation with social networks     Family History: The patient's family history includes Breast cancer in her cousin and maternal aunt; CAD in her daughter; Colon cancer (age of onset: 73) in her sister; Colon polyps in her sister; Esophageal cancer (age of onset: 32) in her sister; Lung cancer (age of onset: 67) in her brother.  ROS:   Please see the history of present illness.     All other systems reviewed and are negative.  EKGs/Labs/Other Studies Reviewed:    The following studies were reviewed today:  Echo 06/11/2021 1. Difficult study due to poor echo windows.   2. Left ventricular ejection fraction, by estimation, is 60 to 65%. The  left ventricle  has normal function. The left ventricle has no regional  wall motion abnormalities. There is mild left ventricular hypertrophy.  Left ventricular diastolic parameters  are consistent with Grade I diastolic dysfunction (impaired relaxation).   3. Right ventricular systolic function is normal. The right ventricular  size is normal.   4. The mitral valve is grossly normal. Trivial mitral valve  regurgitation.   5. The aortic valve is tricuspid. There is mild thickening of the aortic  valve. Aortic valve regurgitation is not visualized. Aortic valve  sclerosis is present, with no evidence of aortic valve stenosis.   6. Aortic dilatation noted. 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   Comparison(s): No prior Echocardiogram.    Recent Labs: 08/08/2023: ALT 23; B Natriuretic Peptide 85.1; BUN 8; Creatinine, Ser 1.07; Hemoglobin 13.8; Platelets 389; Potassium 2.9; Sodium 140  Recent Lipid Panel    Component Value Date/Time   CHOL 110 04/02/2022 1125   TRIG 90 04/02/2022 1125   HDL 53 04/02/2022 1125   CHOLHDL 2.1 04/02/2022 1125   CHOLHDL 3.2 06/10/2021 0439   VLDL 14 06/10/2021 0439   LDLCALC 40 04/02/2022 1125     Risk Assessment/Calculations:           Physical Exam:    VS:  BP 134/70 (BP Location: Left Arm, Patient Position: Sitting, Cuff Size: Large)   Pulse 96   Ht 5\' 2"  (1.575 m)   Wt 201 lb 9.6 oz (91.4 kg)   SpO2 97%   BMI 36.87 kg/m     Wt Readings from Last 3 Encounters:  10/11/23 201 lb 9.6 oz (91.4 kg)  10/07/23 201 lb 4.8 oz (91.3 kg)  08/08/23 203 lb 14.8 oz (92.5 kg)     GEN:  Well nourished, obese,  in no acute distress HEENT: Normal NECK: No JVD; No carotid bruits LYMPHATICS: No lymphadenopathy CARDIAC:  RRR, no murmurs, rubs, gallops. Mild edema RESPIRATORY:  Clear to auscultation without rales, wheezing or rhonchi  ABDOMEN: Soft, non-tender, non-distended MUSCULOSKELETAL:  No edema; No deformity  SKIN: Warm and  dry NEUROLOGIC:  Alert and oriented x 3 PSYCHIATRIC:  Normal affect   ASSESSMENT:    1. Coronary artery disease involving native coronary artery of native heart without angina pectoris   2. Dyslipidemia, goal LDL below 70   3. Essential hypertension       PLAN:    In order of problems listed above:  CAD: s/p PCI to RCA with DES in January 2023. Rate atypical chest pain.    Continue aspirin . Continue Toprol  XL and amlodipine . Monitor symptoms and let us  know if occurring more frequently.   Hypertension: Blood pressure well controlled on current therapy  Hyperlipidemia: Continue Zetia  and - on Repatha .statin intolerant.   History of CVA: No recurrence.         Follow up with me in one year    Signed, Kue Fox [place], MD  10/11/2023 2:24 PM    [facility]

## 2023-10-07 ENCOUNTER — Encounter: Payer: Self-pay | Admitting: Nurse Practitioner

## 2023-10-07 ENCOUNTER — Inpatient Hospital Stay: Admitting: Nurse Practitioner

## 2023-10-07 ENCOUNTER — Inpatient Hospital Stay

## 2023-10-07 ENCOUNTER — Inpatient Hospital Stay: Admitting: Oncology

## 2023-10-07 ENCOUNTER — Telehealth: Payer: Self-pay

## 2023-10-07 ENCOUNTER — Inpatient Hospital Stay: Attending: Nurse Practitioner

## 2023-10-07 VITALS — BP 140/74 | HR 84 | Temp 98.4°F | Resp 20 | Wt 201.3 lb

## 2023-10-07 DIAGNOSIS — C182 Malignant neoplasm of ascending colon: Secondary | ICD-10-CM

## 2023-10-07 DIAGNOSIS — Z85038 Personal history of other malignant neoplasm of large intestine: Secondary | ICD-10-CM | POA: Insufficient documentation

## 2023-10-07 LAB — CEA (ACCESS): CEA (CHCC): 1.94 ng/mL (ref 0.00–5.00)

## 2023-10-07 NOTE — Telephone Encounter (Signed)
-----   Message from Coni Deep sent at 10/07/2023  3:27 PM EDT ----- Please call patient, the CEA is normal, follow-up as scheduled

## 2023-10-07 NOTE — Progress Notes (Signed)
 [facility]     [phone] [phone]    Patient Care Team: Medicine, [facility] as PCP - General (Family Medicine) [place], Peter M, MD as PCP - Cardiology (Cardiology) Serafin Dames, MD as Consulting Physician (Gastroenterology) Cassie Shorts, RN (Inactive) as Registered Nurse Cassie Ends, MD as Consulting Physician (Oncology)   CHIEF COMPLAINT: Follow up colon cancer   CURRENT THERAPY: Surveillance  INTERVAL HISTORY Ms. [name] returns for follow up as scheduled. Last seen by Dr. Scherrie Thomas 03/17/23. She has been stable since then. Has mild residual neuropathy and balance issues since chemo, managing OK. Ambulating with walker. Tried PT and has exercises but is cost prohibitive. Bowels moving normally, occasional diarrhea with certain foods. Denies bleeding, abdominal pain/bloating, unintentional weight loss, or any other specific complaints.   ROS  All other systems reviewed and negative  Past Medical History:  Diagnosis Date   Cancer of right colon (HCC)    Constipation    CVA (cerebral infarction) 1996   Dyspnea    going up stairs and hills   Family history of colon cancer    GERD (gastroesophageal reflux disease)    History of cardiomegaly    Hyperlipidemia    Hypertension    Iron deficiency anemia    NSTEMI (non-ST elevated myocardial infarction) (HCC) 2017   Personal history of colonic polyps    PONV (postoperative nausea and vomiting)    Right shoulder pain    more painful at night , cold makes it worse   Severe obesity (HCC)      Past Surgical History:  Procedure Laterality Date   ABDOMINAL HYSTERECTOMY     BACK SURGERY     x2   CARDIAC CATHETERIZATION N/A 02/28/2016   Procedure: Left Heart Cath and Coronary Angiography;  Surgeon: Peter M [place], MD;  Location: [facility] INVASIVE CV LAB;  Service: Cardiovascular;  Laterality: N/A;   CARDIAC CATHETERIZATION N/A 02/28/2016   Procedure: Coronary Stent  Intervention;  Surgeon: Peter M [place], MD;  Location[facility] INVASIVE CV LAB;  Service: Cardiovascular;  Laterality: N/A;   CHOLECYSTECTOMY     COLONOSCOPY  11/24/2018   CORONARY ANGIOPLASTY     CORONARY STENT INTERVENTION N/A 06/10/2021   Procedure: CORONARY STENT INTERVENTION;  Surgeon: Cassie Binning, MD;  Location: [facility] INVASIVE CV LAB;  Service: Cardiovascular;  Laterality: N/A;   CYST EXCISION Left    Cheek   IR REMOVAL TUN ACCESS W/ PORT W/O FL MOD SED  06/29/2022   LAPAROSCOPIC RIGHT HEMI COLECTOMY Right 01/17/2019   Procedure: LAPAROSCOPIC RIGHT HEMI COLECTOMY;  Surgeon: Cassie Norlander, MD;  Location: [facility] ORS;  Service: General;  Laterality: Right;   LEFT HEART CATH AND CORONARY ANGIOGRAPHY N/A 06/10/2021   Procedure: LEFT HEART CATH AND CORONARY ANGIOGRAPHY;  Surgeon: Cassie Binning, MD;  Location: [facility] INVASIVE CV LAB;  Service: Cardiovascular;  Laterality: N/A;   NASAL FRACTURE SURGERY     PORTACATH PLACEMENT N/A 02/22/2019   Procedure: POWER PORT PLACEMENT;  Surgeon: Cassie Norlander, MD;  Location: [facility] OR;  Service: General;  Laterality: N/A;   UPPER GI ENDOSCOPY  11/24/2018     Outpatient Encounter Medications as of 10/07/2023  Medication Sig Note   acetaminophen  (TYLENOL ) 650 MG CR tablet Take 650 mg by mouth every 8 (eight) hours as needed for pain.    amLODipine  (NORVASC ) 5 MG tablet Take 1 tablet (5 mg total) by mouth daily.    Ascorbic Acid (VITAMIN  C) 1000 MG tablet Take 1,000 mg by mouth daily.    ASPIRIN  81 PO Take 81 mg by mouth daily.    Evolocumab  (REPATHA  SURECLICK) 140 MG/ML SOAJ Inject 140 mg into the skin every 14 (fourteen) days.    ezetimibe  (ZETIA ) 10 MG tablet Take 1 tablet (10 mg total) by mouth daily.    metoprolol  succinate (TOPROL -XL) 25 MG 24 hr tablet TAKE 1/2 TABLET BY MOUTH EVERY DAY    omeprazole (PRILOSEC) 40 MG capsule Take 40 mg by mouth daily.    valsartan (DIOVAN) 160 MG tablet Take 160 mg by mouth daily.    vitamin B-12 (CYANOCOBALAMIN ) 1000 MCG tablet Take  1,000 mcg by mouth daily.    VITAMIN D PO Take 1 tablet by mouth daily.    VITAMIN E PO Take 1 tablet by mouth daily.    loperamide (IMODIUM) 2 MG capsule Take 2-4 mg by mouth 4 (four) times daily as needed for diarrhea or loose stools. (Patient not taking: Reported on 09/14/2022)    nitroGLYCERIN  (NITROSTAT ) 0.4 MG SL tablet Place 1 tablet (0.4 mg total) under the tongue every 5 (five) minutes x 3 doses as needed for chest pain. (Patient not taking: Reported on 10/07/2023) 09/14/2022: As needed   nystatin cream (MYCOSTATIN) Apply 1 application topically 2 (two) times daily as needed (rash). (Patient not taking: Reported on 10/07/2023) 03/17/2023: Under breasts   Facility-Administered Encounter Medications as of 10/07/2023  Medication   heparin  lock flush 100 unit/mL     Today's Vitals   10/07/23 1036  BP: (!) 155/72  Pulse: 84  Resp: 20  Temp: 98.4 F (36.9 C)  TempSrc: Temporal  SpO2: 100%  Weight: 201 lb 4.8 oz (91.3 kg)  PainSc: 0-No pain   Body mass index is 36.82 kg/m.   ECOG PERFORMANCE STATUS: 1 - Symptomatic but completely ambulatory  PHYSICAL EXAM GENERAL:alert, no distress and comfortable SKIN: no rash  EYES: sclera clear LUNGS: clear with normal breathing effort HEART: regular rate & rhythm, no lower extremity edema ABDOMEN: abdomen soft, non-tender and normal bowel sounds NEURO: alert & oriented x 3 with fluent speech   CBC    Latest Ref Rng & Units 08/08/2023    1:58 PM 04/02/2022   11:25 AM 06/12/2021   12:59 AM  CBC  WBC 4.0 - 10.5 K/uL 9.6  8.1  10.6   Hemoglobin 12.0 - 15.0 g/dL 82.9  56.2  13.0   Hematocrit 36.0 - 46.0 % 41.2  40.9  36.5   Platelets 150 - 400 K/uL 389  322  246       CMP     Latest Ref Rng & Units 08/08/2023    1:58 PM 05/25/2022   12:09 PM 04/02/2022   11:25 AM  CMP  Glucose 70 - 99 mg/dL 865  784  99   BUN 8 - 23 mg/dL 8  16  14    Creatinine 0.44 - 1.00 mg/dL 6.96  2.95  2.84   Sodium 135 - 145 mmol/L 140  141  142   Potassium 3.5 -  5.1 mmol/L 2.9  4.0  4.2   Chloride 98 - 111 mmol/L 102  103  102   CO2 22 - 32 mmol/L 28  26  25    Calcium  8.9 - 10.3 mg/dL 9.7  9.9  9.9   Total Protein 6.5 - 8.1 g/dL 7.2   7.2   Total Bilirubin 0.0 - 1.2 mg/dL 0.6   0.3   Alkaline Phos 38 -  126 U/L 78   125   AST 15 - 41 U/L 18   14   ALT 0 - 44 U/L 23   13       ASSESSMENT & PLAN:  Colon cancer (T3, N1b) Colonoscopy 11/24/2018-large 5 cm malignant appearing fungating mass encompassing the ileocecal valve was identified.  Pathology showed invasive adenocarcinoma, moderately differentiated.   CT abdomen/pelvis 12/07/2018-eccentric wall thickening in the cecum, around the ileocecal valve, compatible with findings reported at the recent colonoscopy.  Borderline enlarged adjacent lymph nodes in the ileocolic mesentery.  No evidence for liver metastases.   CEA 01/13/2019-12.7 CEA 02/23/19 postop/at start of adjuvant chemo - 1.83 01/17/2019 right hemicolectomy by Dr. Odean Bend.  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 Declined genetic testing on 02/20/19, patient indicated she may reconsider after completing adjuvant treatment. Seen by Marijo Shove CT chest 02/17/2019-tiny bilateral pulmonary nodules felt to be unlikely related to metastatic disease PAC placement per Dr. Odean Bend 02/22/2019; Port-A-Cath removed 06/29/2022 Cycle 1 adjuvant FOLFOX 02/23/19  Cycle 2 adjuvant FOLFOX 03/09/2019 Cycle 3 adjuvant FOLFOX 03/23/2019 (oxaliplatin  dose reduced due to low normal neutrophil count) Cycle 4 adjuvant FOLFOX 04/06/2019 Cycle 5 adjuvant FOLFOX 05/01/2019 Cycle 6 adjuvant FOLFOX 05/15/2019 (oxaliplatin  held due to neuropathy) CT 12/04/2019-stable right thyroid  nodule, no  evidence of metastatic disease, stable bilateral lung nodules CTs 10/15/2020- no evidence of metastatic disease chest, abdomen, pelvis.  Occasional tiny pulmonary nodules stable.  Interval enlargement of a nodule right lobe of the thyroid . CTs 10/14/2021-no metastatic disease in the chest, abdomen or pelvis.  Solitary tiny right lower lobe pulmonary nodule stable.   History of iron deficiency anemia Hypertension History of MI status post stent placement History of stroke 1996 Oxaliplatin  neuropathy Anorexia/weight loss Report of balance difficulty B 12 deficiency-B12 weekly x4 beginning 12/09/2019.  On oral B12. Right thyroid  nodule on CT 10/15/2020.  Referred for thyroid  ultrasound.  Thyroid  ultrasound 10/31/2020-3.3 cm right inferior TR 3 nodule meets criteria for biopsy, this correlates with the chest CT finding.  Additional 1.5 cm right superior T3 nodule meets criteria for follow-up in 1 year.  FNA biopsy of right inferior thyroid  lesion 11/12/2020-scant follicular epithelium present.  Nondiagnostic material.      Disposition: Ms. [name] remains in clinical remission from colon cancer. We will follow up the pending CEA from today.   She declined surveillance colonoscopies due to her limited mobility. She is 4.5 years from completing adjuvant therapy, the recurrence risk has significantly decreased. She will return for CEA and what will likely be her last visit in 6 months.   Orders Placed This Encounter  Procedures   CEA    Standing Status:   Future    Expected Date:   04/08/2024    Expiration Date:   10/06/2024      All questions were answered. The patient knows to call the clinic with any problems, questions or concerns. No barriers to learning were detected.  Jourdon Zimmerle K Shaquanta Harkless, NP 10/07/2023

## 2023-10-07 NOTE — Telephone Encounter (Signed)
 Patient gave verbal understanding and had no further questions or concerns

## 2023-10-11 ENCOUNTER — Encounter: Payer: Self-pay | Admitting: Cardiology

## 2023-10-11 ENCOUNTER — Ambulatory Visit: Payer: Medicare Other | Attending: Cardiology | Admitting: Cardiology

## 2023-10-11 VITALS — BP 134/70 | HR 96 | Ht 62.0 in | Wt 201.6 lb

## 2023-10-11 DIAGNOSIS — I2510 Atherosclerotic heart disease of native coronary artery without angina pectoris: Secondary | ICD-10-CM

## 2023-10-11 DIAGNOSIS — E785 Hyperlipidemia, unspecified: Secondary | ICD-10-CM

## 2023-10-11 DIAGNOSIS — I10 Essential (primary) hypertension: Secondary | ICD-10-CM | POA: Diagnosis not present

## 2023-10-11 NOTE — Patient Instructions (Signed)

## 2023-10-15 ENCOUNTER — Other Ambulatory Visit: Payer: Self-pay

## 2023-11-06 ENCOUNTER — Other Ambulatory Visit: Payer: Self-pay | Admitting: Cardiology

## 2023-11-06 ENCOUNTER — Other Ambulatory Visit: Payer: Self-pay | Admitting: Physician Assistant

## 2023-11-09 ENCOUNTER — Other Ambulatory Visit (HOSPITAL_COMMUNITY): Payer: Self-pay

## 2023-11-09 ENCOUNTER — Other Ambulatory Visit (HOSPITAL_BASED_OUTPATIENT_CLINIC_OR_DEPARTMENT_OTHER): Payer: Self-pay

## 2023-11-19 ENCOUNTER — Encounter: Payer: Self-pay | Admitting: Oncology

## 2023-11-19 ENCOUNTER — Other Ambulatory Visit (HOSPITAL_BASED_OUTPATIENT_CLINIC_OR_DEPARTMENT_OTHER): Payer: Self-pay

## 2023-11-19 ENCOUNTER — Other Ambulatory Visit (HOSPITAL_COMMUNITY): Payer: Self-pay

## 2024-01-31 ENCOUNTER — Other Ambulatory Visit: Payer: Self-pay | Admitting: Cardiology

## 2024-03-08 ENCOUNTER — Other Ambulatory Visit: Payer: Self-pay | Admitting: Cardiology

## 2024-04-05 ENCOUNTER — Telehealth: Payer: Self-pay | Admitting: Oncology

## 2024-04-05 NOTE — Telephone Encounter (Signed)
 Patient called to confirm appt

## 2024-04-10 ENCOUNTER — Inpatient Hospital Stay: Attending: Oncology | Admitting: Oncology

## 2024-04-10 ENCOUNTER — Inpatient Hospital Stay

## 2024-04-10 ENCOUNTER — Encounter: Payer: Self-pay | Admitting: *Deleted

## 2024-04-10 VITALS — BP 134/88 | HR 100 | Temp 98.4°F | Resp 18 | Ht 62.0 in | Wt 203.9 lb

## 2024-04-10 DIAGNOSIS — C182 Malignant neoplasm of ascending colon: Secondary | ICD-10-CM | POA: Diagnosis not present

## 2024-04-10 DIAGNOSIS — Z85038 Personal history of other malignant neoplasm of large intestine: Secondary | ICD-10-CM | POA: Insufficient documentation

## 2024-04-10 NOTE — Progress Notes (Signed)
 [facility] OFFICE PROGRESS NOTE   Diagnosis: Colon cancer  INTERVAL HISTORY:   [name] returns as scheduled.  Good appetite.  She reports intermittent loose stool.  No bleeding.  Objective:  Vital signs in last 24 hours:  Blood pressure 134/88, pulse 100, temperature 98.4 F (36.9 C), temperature source Temporal, resp. rate 18, height 5' 2 (1.575 m), weight 203 lb 14.4 oz (92.5 kg), SpO2 100%.    Lymphatics: No cervical, supraclavicular, axillary, or inguinal nodes Resp: Lungs clear bilaterally Cardio: Regular rate and rhythm GI: No hepatosplenomegaly, no mass, nontender Vascular: No leg edema   Lab Results:  Lab Results  Component Value Date   WBC 9.6 08/08/2023   HGB 13.8 08/08/2023   HCT 41.2 08/08/2023   MCV 89.4 08/08/2023   PLT 389 08/08/2023   NEUTROABS 5.9 08/08/2023    CMP  Lab Results  Component Value Date   NA 140 08/08/2023   K 2.9 (L) 08/08/2023   CL 102 08/08/2023   CO2 28 08/08/2023   GLUCOSE 120 (H) 08/08/2023   BUN 8 08/08/2023   CREATININE 1.07 (H) 08/08/2023   CALCIUM  9.7 08/08/2023   PROT 7.2 08/08/2023   ALBUMIN 3.9 08/08/2023   AST 18 08/08/2023   ALT 23 08/08/2023   ALKPHOS 78 08/08/2023   BILITOT 0.6 08/08/2023   GFRNONAA 53 (L) 08/08/2023   GFRAA 58 (L) 12/04/2019    Lab Results  Component Value Date   CEA1 2.54 10/14/2020   CEA 1.94 10/07/2023    Lab Results  Component Value Date   INR 1.11 02/28/2016   LABPROT 14.4 02/28/2016    Imaging:  No results found.  Medications: I have reviewed the patient's current medications.   Assessment/Plan: Colon cancer (T3, N1b) Colonoscopy 11/24/2018-large 5 cm malignant appearing fungating mass encompassing the ileocecal valve was identified.  Pathology showed invasive adenocarcinoma, moderately differentiated.   CT abdomen/pelvis 12/07/2018-eccentric wall thickening in the cecum, around the ileocecal valve, compatible with findings reported at the recent  colonoscopy.  Borderline enlarged adjacent lymph nodes in the ileocolic mesentery.  No evidence for liver metastases.   CEA 01/13/2019-12.7 CEA 02/23/19 postop/at start of adjuvant chemo - 1.83 01/17/2019 right hemicolectomy by Dr. Ethyl.  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 Declined genetic testing on 02/20/19, patient indicated she may reconsider after completing adjuvant treatment. Seen by Darice Monte CT chest 02/17/2019-tiny bilateral pulmonary nodules felt to be unlikely related to metastatic disease PAC placement per Dr. Ethyl 02/22/2019; Port-A-Cath removed 06/29/2022 Cycle 1 adjuvant FOLFOX 02/23/19  Cycle 2 adjuvant FOLFOX 03/09/2019 Cycle 3 adjuvant FOLFOX 03/23/2019 (oxaliplatin  dose reduced due to low normal neutrophil count) Cycle 4 adjuvant FOLFOX 04/06/2019 Cycle 5 adjuvant FOLFOX 05/01/2019 Cycle 6 adjuvant FOLFOX 05/15/2019 (oxaliplatin  held due to neuropathy) CT 12/04/2019-stable right thyroid  nodule, no evidence of metastatic disease, stable bilateral lung nodules CTs 10/15/2020- no evidence of metastatic disease chest, abdomen, pelvis.  Occasional tiny pulmonary nodules stable.  Interval enlargement of a nodule right lobe of the thyroid . CTs 10/14/2021-no metastatic disease in the chest, abdomen or pelvis.  Solitary tiny right lower lobe pulmonary nodule stable.   History of iron deficiency anemia Hypertension History of MI status post stent placement History of stroke 1996 Oxaliplatin  neuropathy Anorexia/weight loss Report of balance difficulty B  12 deficiency-B12 weekly x4 beginning 12/09/2019.  On oral B12. Right thyroid  nodule on CT 10/15/2020.  Referred for thyroid  ultrasound.  Thyroid   ultrasound 10/31/2020-3.3 cm right inferior TR 3 nodule meets criteria for biopsy, this correlates with the chest CT finding.  Additional 1.5 cm right superior T3 nodule meets criteria for follow-up in 1 year.  FNA biopsy of right inferior thyroid  lesion 11/12/2020-scant follicular epithelium present.  Nondiagnostic material.       Disposition: [name] is in clinical remission from colon cancer.  She is now greater than 5 years out from diagnosis.  We will follow-up on the CEA from today.  She has decided against colonoscopy surveillance.  We will ask her to schedule a thyroid  ultrasound to follow-up on previously noted nodules.  She can follow-up with her primary provider for the thyroid  nodules.  Ms. Mcmasters was discharged from the medical oncology clinic today.  I am available to see her as needed.  Arley Hof, MD  04/10/2024  11:43 AM

## 2024-04-10 NOTE — Progress Notes (Signed)
 Routed today's office note and lab results to [facility] [phone] with note asking if she ever had US  thyroid  f/u as recommended on her 2022 US .

## 2024-04-11 ENCOUNTER — Ambulatory Visit: Payer: Self-pay | Admitting: Nurse Practitioner

## 2024-04-11 LAB — CEA: CEA: 2.1 ng/mL (ref 0.0–4.7)

## 2024-04-12 ENCOUNTER — Encounter: Payer: Self-pay | Admitting: Oncology

## 2024-04-12 NOTE — Telephone Encounter (Signed)
-----   Message from Lacie K Burton sent at 04/11/2024  2:03 PM EST ----- Please let Ms. Tudor know the CEA remains normal. I believe she was doing well and Dr. Cloretta discharged her from follow up.  Thanks Lacie NP ----- Message ----- From: Rebecka, Lab [facility] [place] Sent: 04/11/2024   8:37 AM EST To: Lacie K Burton, NP

## 2024-04-12 NOTE — Telephone Encounter (Signed)
 Patient gave verbal understanding and had no further questions.

## 2024-04-28 ENCOUNTER — Other Ambulatory Visit: Payer: Self-pay | Admitting: Cardiology

## 2024-04-28 DIAGNOSIS — E785 Hyperlipidemia, unspecified: Secondary | ICD-10-CM

## 2024-04-28 DIAGNOSIS — I2510 Atherosclerotic heart disease of native coronary artery without angina pectoris: Secondary | ICD-10-CM

## 2024-04-28 DIAGNOSIS — I214 Non-ST elevation (NSTEMI) myocardial infarction: Secondary | ICD-10-CM

## 2024-05-01 MED ORDER — EZETIMIBE 10 MG PO TABS: 10.0000 mg | ORAL_TABLET | Freq: Every day | ORAL | 2 refills | Status: AC
